# Patient Record
Sex: Male | Born: 1945 | Race: White | Hispanic: No | Marital: Married | Smoking: Never smoker
Health system: Southern US, Community
[De-identification: ages and names within clinical notes are randomized; demographics above are authoritative.]

## PROBLEM LIST (undated history)

## (undated) DIAGNOSIS — K219 Gastro-esophageal reflux disease without esophagitis: Secondary | ICD-10-CM

## (undated) DIAGNOSIS — I509 Heart failure, unspecified: Secondary | ICD-10-CM

## (undated) DIAGNOSIS — Z7401 Bed confinement status: Secondary | ICD-10-CM

## (undated) DIAGNOSIS — H409 Unspecified glaucoma: Secondary | ICD-10-CM

## (undated) DIAGNOSIS — G4730 Sleep apnea, unspecified: Secondary | ICD-10-CM

## (undated) DIAGNOSIS — I493 Ventricular premature depolarization: Secondary | ICD-10-CM

## (undated) DIAGNOSIS — M48061 Spinal stenosis, lumbar region without neurogenic claudication: Secondary | ICD-10-CM

## (undated) DIAGNOSIS — E785 Hyperlipidemia, unspecified: Secondary | ICD-10-CM

## (undated) DIAGNOSIS — I428 Other cardiomyopathies: Secondary | ICD-10-CM

## (undated) DIAGNOSIS — I10 Essential (primary) hypertension: Secondary | ICD-10-CM

## (undated) DIAGNOSIS — E119 Type 2 diabetes mellitus without complications: Secondary | ICD-10-CM

## (undated) DIAGNOSIS — M25511 Pain in right shoulder: Secondary | ICD-10-CM

## (undated) DIAGNOSIS — I2510 Atherosclerotic heart disease of native coronary artery without angina pectoris: Secondary | ICD-10-CM

## (undated) HISTORY — PX: HYDROCELE EXCISION / REPAIR: SUR1145

## (undated) HISTORY — PX: OTHER SURGICAL HISTORY: SHX169

## (undated) HISTORY — PX: REPLACEMENT TOTAL KNEE: SUR1224

## (undated) NOTE — *Deleted (*Deleted)
PMR Admission Coordinator Pre-Admission Assessment  Patient: Roy [name]. is an 72 y.o., male MRN: [id number] DOB: 10-Feb-1946 Height: 6\' 4"  (193 cm) Weight: 115.5 kg  Insurance Information HMO: yes    PPO:      PCP:      IPA:      80/20:      OTHER:  PRIMARY: [employer] Medicare      Policy#: [id number]      Subscriber: pt CM Name: Herbert Seta      Phone#: ***     Fax#: *** Pre-Cert#: ***      Employer: *** Benefits:  Phone #: ***     Name: *** Dolores Hoose. Date: ***     Deduct: ***      Out of Pocket Max: ***      Life Max: *** CIR: ***      SNF: *** Outpatient: ***     Co-Pay: *** Home Health: ***      Co-Pay: *** DME: ***     Co-Pay: *** Providers: ***  SECONDARY:       Policy#:      Phone#:   Financial Counselor:       Phone#:   The "Data Collection Information Summary" for patients in Inpatient Rehabilitation Facilities with attached "Privacy Act Statement-Health Care Records" was provided and verbally reviewed with: Patient and Family  Emergency Contact Information Contact Information    Name Relation Home Work Mobile   Vincent Spouse [phone]  [phone]      Current Medical History  Patient Admitting Diagnosis: lumbar radiculopathy History of Present Illness:   72 year old right-handed male with history of diastolic congestive heart failure, CAD/nonischemic cardiomyopathy maintained on aspirin, diabetes mellitus, glaucoma with left eye blindness, hypertension, hyperlipidemia.  Per chart review patient lives with spouse 1 level home 4 steps to entry.  Spouse reports patient was essentially dependent level for ADL management due to increasing lower extremity weakness.  Patient with complicated course dating 01/14/2020 with increasing weakness lower extremities.  MRI cervical spine revealed severe stenosis C3-4 spinal cord compression.  He was briefly discharged to skilled nursing facility awaiting plans for surgical intervention.  Admitted 01/29/2020 presented 01/29/2020  and underwent C3-4 anterior cervical decompression discectomy per Dr. Whitney Post.  A preoperative hemoglobin showed 13.4 no postoperative hemoglobin reported.  He was cleared to begin Lovenox for DVT prophylaxis.  ***    Patient's medical record from [facility] has been reviewed by the rehabilitation admission coordinator and physician.  Past Medical History  Past Medical History:  Diagnosis Date  . CHF (congestive heart failure) (HCC)   . Coronary artery disease   . Diabetes mellitus without complication (HCC)   . Glaucoma   . HLD (hyperlipidemia)   . Hypertension   . Non-ischemic cardiomyopathy (HCC)     Family History   family history is not on file.  Prior Rehab/Hospitalizations Has the patient had prior rehab or hospitalizations prior to admission? Yes  Has the patient had major surgery during 100 days prior to admission? Yes   Current Medications  Current Facility-Administered Medications:  .  0.9 %  sodium chloride infusion, 50 mL/hr, Intravenous, Continuous, Zdeb, Christine, NP, Stopped at 01/30/20 1505 .  alum & mag hydroxide-simeth (MAALOX/MYLANTA) 200-200-20 MG/5ML suspension 30 mL, 30 mL, Oral, Q6H PRN, Zdeb, Christine, NP .  amLODipine (NORVASC) tablet 5 mg, 5 mg, Oral, Daily, Zdeb, Christine, NP, 5 mg at 02/08/20 1019 .  bisacodyl (DULCOLAX) EC tablet 5 mg, 5 mg, Oral, Daily PRN, Zdeb,  Christine, NP .  brimonidine (ALPHAGAN) 0.2 % ophthalmic solution 1 drop, 1 drop, Right Eye, BID, Zdeb, Christine, NP, 1 drop at 02/08/20 1020 .  carvedilol (COREG) tablet 6.25 mg, 6.25 mg, Oral, BID, Zdeb, Christine, NP, 6.25 mg at 02/08/20 1019 .  Chlorhexidine Gluconate Cloth 2 % PADS 6 each, 6 each, Topical, Daily, [name], MD, 6 each at 02/08/20 1026 .  docusate sodium (COLACE) capsule 100 mg, 100 mg, Oral, BID, Zdeb, Christine, NP, 100 mg at 02/07/20 2252 .  enoxaparin (LOVENOX) injection 40 mg, 40 mg, Subcutaneous, Q24H, Zdeb, Christine, NP, 40 mg at 02/07/20 2253 .   famotidine (PEPCID) tablet 20 mg, 20 mg, Oral, QHS PRN, Zdeb, Christine, NP .  hydrALAZINE (APRESOLINE) injection 10 mg, 10 mg, Intravenous, Q4H PRN, Sreenath, Sudheer B, MD .  HYDROmorphone (DILAUDID) injection 0.25 mg, 0.25 mg, Intravenous, Q4H PRN, Zdeb, Christine, NP .  insulin aspart (novoLOG) injection 0-15 Units, 0-15 Units, Subcutaneous, TID WC, Zdeb, Christine, NP, 2 Units at 02/08/20 0831 .  insulin detemir (LEVEMIR) injection 12 Units, 12 Units, Subcutaneous, QHS, Lolita Patella B, MD, 12 Units at 02/07/20 2251 .  latanoprost (XALATAN) 0.005 % ophthalmic solution 1 drop, 1 drop, Right Eye, QHS, Zdeb, Christine, NP, 1 drop at 02/07/20 2253 .  linagliptin (TRADJENTA) tablet 5 mg, 5 mg, Oral, Daily, Sreenath, Sudheer B, MD, 5 mg at 02/08/20 1019 .  melatonin tablet 10 mg, 10 mg, Oral, QHS, Manuela Schwartz, NP, 10 mg at 02/07/20 2252 .  menthol-cetylpyridinium (CEPACOL) lozenge 3 mg, 1 lozenge, Oral, PRN **OR** phenol (CHLORASEPTIC) mouth spray 1 spray, 1 spray, Mouth/Throat, PRN, Zdeb, Christine, NP .  ondansetron (ZOFRAN) tablet 4 mg, 4 mg, Oral, Q6H PRN **OR** ondansetron (ZOFRAN) injection 4 mg, 4 mg, Intravenous, Q6H PRN, Zdeb, Christine, NP .  oxyCODONE (Oxy IR/ROXICODONE) immediate release tablet 10 mg, 10 mg, Oral, Q3H PRN, Zdeb, Christine, NP .  oxyCODONE (Oxy IR/ROXICODONE) immediate release tablet 5 mg, 5 mg, Oral, Q3H PRN, Zdeb, Christine, NP .  polyethylene glycol (MIRALAX / GLYCOLAX) packet 17 g, 17 g, Oral, Daily PRN, Zdeb, Christine, NP .  polyethylene glycol (MIRALAX / GLYCOLAX) packet 17 g, 17 g, Oral, q AM, [name], MD, 17 g at 02/06/20 8657 .  senna (SENOKOT) tablet 17.2 mg, 2 tablet, Oral, BID, Zdeb, Christine, NP, 17.2 mg at 02/07/20 0916 .  simvastatin (ZOCOR) tablet 20 mg, 20 mg, Oral, Daily, Zdeb, Christine, NP, 20 mg at 02/07/20 2252 .  sodium chloride flush (NS) 0.9 % injection 3 mL, 3 mL, Intravenous, Q12H, Zdeb, Christine, NP, 3 mL at 02/08/20 1025 .   sodium chloride flush (NS) 0.9 % injection 3 mL, 3 mL, Intravenous, PRN, Zdeb, Christine, NP .  sodium phosphate (FLEET) 7-19 GM/118ML enema 1 enema, 1 enema, Rectal, Once PRN, Zdeb, Christine, NP .  tamsulosin (FLOMAX) capsule 0.4 mg, 0.4 mg, Oral, QPC supper, Zdeb, Christine, NP, 0.4 mg at 02/07/20 1719 .  traZODone (DESYREL) tablet 50 mg, 50 mg, Oral, QHS PRN, Georgeann Oppenheim, Sudheer B, MD, 50 mg at 02/06/20 2318  Patients Current Diet:  Diet Order            Diet Carb Modified Fluid consistency: Thin; Room service appropriate? Yes  Diet effective now                 Precautions / Restrictions Precautions Precautions: Fall Precaution Booklet Issued: No Precaution Comments: knees buckle Other Brace: new order for soft cervical brace for pt comfort; bilat knee braces Restrictions Weight  Bearing Restrictions: No Other Position/Activity Restrictions: B shoulder pain/weakness limting UE assist.   Has the patient had 2 or more falls or a fall with injury in the past year? No  Prior Activity Level Limited Community (1-2x/wk): Mod I with RW short disctances only  Prior Functional Level Self Care: Did the patient need help bathing, dressing, using the toilet or eating? Needed some help  Indoor Mobility: Did the patient need assistance with walking from room to room (with or without device)? Independent  Stairs: Did the patient need assistance with internal or external stairs (with or without device)? Needed some help  Functional Cognition: Did the patient need help planning regular tasks such as shopping or remembering to take medications? Independent  Home Assistive Devices / Equipment Home Assistive Devices/Equipment: Eyeglasses Home Equipment: Environmental consultant - 2 wheels, Environmental consultant - 4 wheels, Grab bars - tub/shower, Shower seat  Prior Device Use: Indicate devices/aids used by the patient prior to current illness, exacerbation or injury? Walker  Current Functional Level Cognition  Overall  Cognitive Status: Within Functional Limits for tasks assessed Orientation Level: Oriented X4 General Comments: A&O x 4, becomes anxious with attempts to stand    Extremity Assessment (includes Sensation/Coordination)  Upper Extremity Assessment: Generalized weakness, Defer to OT evaluation (noted decreased BUE ROM and strength bilaterally) LUE Deficits / Details: Pt endorses LUE sensation is improved since his sx, however, it remains weak with poor shoulder flexion/abduction. AROM limited to ~80-90 degrees strength poor grossly 2/5 as well as decreased FMC and edema in L hand. LUE Sensation: history of peripheral neuropathy LUE Coordination: decreased fine motor, decreased gross motor  Lower Extremity Assessment: Generalized weakness (grossly 3- to 3+/5 bilaterally)    ADLs  Overall ADL's : Needs assistance/impaired Eating/Feeding: Sitting, Bed level, With caregiver independent assisting, Moderate assistance Eating/Feeding Details (indicate cue type and reason): Pt limited by cervical precautions, unable to utlize learned compensatory stratgies for decreased shoulder flexion and FMC. Spouse has been assisting with all meals since admission. Grooming: Sitting, Cueing for compensatory techniques, Cueing for safety, Moderate assistance Grooming Details (indicate cue type and reason): OTR provided mod physical assist for pt to simulate face washing with L hand.  Pt limited by poor grasp and decreased shoulder/wrist ROM needed to angle/reach.  Pt able to wash face with R hand with min A. Upper Body Bathing: Moderate assistance, Sitting, Cueing for safety, Cueing for compensatory techniques Upper Body Bathing Details (indicate cue type and reason): pt requires hand over hand assist to grip wash cloth/towel d/t weak grasp and limited shoulder/wrist ROM Lower Body Bathing: Cueing for back precautions, Moderate assistance, Sitting/lateral leans Upper Body Dressing : Sitting, Maximal assistance, Cueing  for safety, Cueing for compensatory techniques Lower Body Dressing: Cueing for compensatory techniques, +2 for physical assistance, Maximal assistance, Sit to/from stand Toilet Transfer: +2 for physical assistance, Maximal assistance, Cueing for safety, Cueing for sequencing, Stand-pivot, BSC Toileting- Clothing Manipulation and Hygiene: Bed level, Maximal assistance Toileting - Clothing Manipulation Details (indicate cue type and reason): Pt is able to roll side-to side with Min A cueing for cervical precautions. Functional mobility during ADLs: Rolling walker, Moderate assistance, Minimal assistance, +2 for physical assistance General ADL Comments: Session focused on BUE mobility/strengthening, as well as functional independence in UB ADLs    Mobility  Overal bed mobility: Needs Assistance Bed Mobility: Sidelying to Sit, Rolling, Supine to Sit, Sit to Supine Rolling: Max assist Sidelying to sit: Max assist, +2 for safety/equipment Supine to sit: Max assist, +2 for safety/equipment  Sit to supine: Max assist Sit to sidelying: Max assist, +2 for safety/equipment, +2 for physical assistance General bed mobility comments: Pt was able to perform log roll R to short sit. max assist to roll with +2 asssist tosafely achieve EOB short sit. Pt present with increased overall weakness today. He is more anxious with all mobility and transfers.    Transfers  Overall transfer level: Needs assistance Equipment used: Rolling walker (2 wheeled) Transfers: Sit to/from Stand Sit to Stand: +2 safety/equipment, +2 physical assistance, From elevated surface Stand pivot transfers: +2 physical assistance, +2 safety/equipment, From elevated surface, Max assist General transfer comment: Pt performed STS with +2 assist for safety. Used Rozell Searing for safety. stood 4 x in sabina x 2 minutes. pt fatigues quickly. Did use lift to safely progress pt to recliner from EOB. returned later to assist pt back to bed.    Ambulation  / Gait / Stairs / Wheelchair Mobility  Ambulation/Gait Ambulation/Gait assistance: Max assist, +2 physical assistance Gait Distance (Feet): 2 Feet Assistive device: Rolling walker (2 wheeled) Gait Pattern/deviations: Step-to pattern General Gait Details: unsafe to trial. continues to have knee buckling present Gait velocity: decreased    Posture / Balance Dynamic Sitting Balance Sitting balance - Comments: pt required constant assistance to maintain balance while seated EOB. much more assistance required today versus previously observed Balance Overall balance assessment: Needs assistance Sitting-balance support: Feet supported, Bilateral upper extremity supported Sitting balance-Leahy Scale: Poor Sitting balance - Comments: pt required constant assistance to maintain balance while seated EOB. much more assistance required today versus previously observed Postural control: Other (comment) (anterior lean) Standing balance support: Bilateral upper extremity supported, During functional activity Standing balance-Leahy Scale: Poor Standing balance comment: heavy lean on RW with +2 hands on at all times for safety.  L knee pain/weakness limits progression    Special needs/care consideration 16 fr urethral catheter placed on 01/19/2020   Previous Home Environment  Living Arrangements: Spouse/significant other  Lives With: Spouse Available Help at Discharge: Family, Available 24 hours/day Type of Home: House Home Layout: One level Home Access: Stairs to enter Entrance Stairs-Rails: Right Entrance Stairs-Number of Steps: 7 in front, 4 in the garage Bathroom Shower/Tub: Health visitor: Handicapped height Bathroom Accessibility: Yes How Accessible: Accessible via walker Home Care Services: No Additional Comments: was at SNF for 3 to 4 days between 2 surgeries  Discharge Living Setting Plans for Discharge Living Setting: Patient's home, Lives with (comment) (wife) Type of  Home at Discharge: House Discharge Home Layout: One level Discharge Home Access: Stairs to enter Entrance Stairs-Rails: Right Entrance Stairs-Number of Steps: 7 in front ; 4 in garage Discharge Bathroom Shower/Tub: Walk-in shower Discharge Bathroom Toilet: Handicapped height Discharge Bathroom Accessibility: Yes How Accessible: Accessible via walker Does the patient have any problems obtaining your medications?: No  Social/Family/Support Systems Contact Information: wife, Myra Anticipated Caregiver: wife Anticipated Caregiver's Contact Information: see above Caregiver Availability: 24/7 Discharge Plan Discussed with Primary Caregiver: Yes Is Caregiver In Agreement with Plan?: Yes Does Caregiver/Family have Issues with Lodging/Transportation while Pt is in Rehab?: No  Goals Patient/Family Goal for Rehab: min assist with PT and OT Expected length of stay: ELOS 2 to 3 weeks Pt/Family Agrees to Admission and willing to participate: Yes Program Orientation Provided & Reviewed with Pt/Caregiver Including Roles  & Responsibilities: Yes  Decrease burden of Care through IP rehab admission: n/a  Possible need for SNF placement upon discharge: not anticipated  Patient Condition: I have reviewed medical records  from [facility], spoken with CSW, and patient and family member. I discussed via phone for inpatient rehabilitation assessment.  Patient will benefit from ongoing PT and OT, can actively participate in 3 hours of therapy a day 5 days of the week, and can make measurable gains during the admission.  Patient will also benefit from the coordinated team approach during an Inpatient Acute Rehabilitation admission.  The patient will receive intensive therapy as well as Rehabilitation physician, nursing, social worker, and care management interventions.  Due to bladder management, bowel management, safety, skin/wound care, disease management, medication administration, pain management and patient  education the patient requires 24 hour a day rehabilitation nursing.  The patient is currently  Max assist with mobility overall with mobility and basic ADLs.  Discharge setting and therapy post discharge at home with home health is anticipated.  Patient has agreed to participate in the Acute Inpatient Rehabilitation Program and will admit today.  Preadmission Screen Completed By:  Clois Dupes, 02/08/2020 11:02 AM ______________________________________________________________________   Discussed status with Dr. Marland Kitchen on *** at *** and received approval for admission today.  Admission Coordinator:  Clois Dupes, RN, time Marland KitchenDorna Bloom ***   Assessment/Plan: Diagnosis: 1. Does the need for close, 24 hr/day Medical supervision in concert with the patient's rehab needs make it unreasonable for this patient to be served in a less intensive setting? {yes_no_potentially:3041433} 2. Co-Morbidities requiring supervision/potential complications: *** 3. Due to {due ZO:1096045}, does the patient require 24 hr/day rehab nursing? {yes_no_potentially:3041433} 4. Does the patient require coordinated care of a physician, rehab nurse, PT, OT, and SLP to address physical and functional deficits in the context of the above medical diagnosis(es)? {yes_no_potentially:3041433} Addressing deficits in the following areas: {deficits:3041436} 5. Can the patient actively participate in an intensive therapy program of at least 3 hrs of therapy 5 days a week? {yes_no_potentially:3041433} 6. The potential for patient to make measurable gains while on inpatient rehab is {potential:3041437} 7. Anticipated functional outcomes upon discharge from inpatient rehab: {functional outcomes:304600100} PT, {functional outcomes:304600100} OT, {functional outcomes:304600100} SLP 8. Estimated rehab length of stay to reach the above functional goals is: *** 9. Anticipated discharge destination: {anticipated dc setting:21604}  10. Overall Rehab/Functional Prognosis: {potential:3041437}   MD Signature: ***

## (undated) NOTE — *Deleted (*Deleted)
Physical Medicine and Rehabilitation Admission H&P     HPI: Roy [name]. Kurt, Hoffmeier. 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  Per chart review patient lives with spouse 1 level home 4 steps to entry.  Spouse reports patient was essentially dependent level for ADL management due to increasing lower extremity weakness.  Patient with complicated course dating 01/14/2020 with increasing weakness lower extremities.  MRI cervical spine revealed severe stenosis C3-4 spinal cord compression.  He was briefly discharged to skilled nursing facility awaiting plans for surgical intervention.  Admitted 01/29/2020 presented 01/29/2020 and underwent C3-4 anterior cervical decompression discectomy per Dr. Whitney Post.  A preoperative hemoglobin showed 13.4 no postoperative hemoglobin reported.  He was cleared to begin Lovenox for DVT prophylaxis.  Therapy evaluations completed and patient was admitted for a comprehensive rehab program.  Review of Systems  Constitutional: Negative for chills and fever.  HENT: Negative for hearing loss.   Eyes: Negative for blurred vision and double vision.  Respiratory: Negative for cough and shortness of breath.   Cardiovascular: Positive for leg swelling. Negative for chest pain and palpitations.  Gastrointestinal: Positive for constipation. Negative for heartburn and nausea.  Genitourinary: Negative for dysuria, flank pain and hematuria.  Musculoskeletal: Positive for joint pain, myalgias and neck pain.  Skin: Negative for rash.  Neurological: Positive for sensory change.       Bilateral lower extremity weakness  All other systems reviewed and are negative.  Past Medical History:  Diagnosis Date  . CHF (congestive heart failure) (HCC)   . Coronary artery disease   . Diabetes mellitus  without complication (HCC)   . Glaucoma   . HLD (hyperlipidemia)   . Hypertension   . Non-ischemic cardiomyopathy [facility])    Past Surgical History:  Procedure Laterality Date  . ANTERIOR CERVICAL DECOMP/DISCECTOMY FUSION N/A 01/29/2020   Procedure: ANTERIOR CERVICAL DECOMPRESSION/DISCECTOMY FUSION 1 LEVEL C3/4;  Surgeon: [name], MD;  Location: [facility] ORS;  Service: Neurosurgery;  Laterality: N/A;  . arm surgery Right    4x surgery as a child, cut arm falling through glass window   . HYDROCELE EXCISION / REPAIR    . REPLACEMENT TOTAL KNEE Right    History reviewed. No pertinent family history. Social History:  reports that he has never smoked. He has never used smokeless tobacco. He reports that he does not drink alcohol. No history on file for drug use. Allergies: No Known Allergies Medications Prior to Admission  Medication Sig Dispense Refill  . aspirin 81 MG chewable tablet Chew 81 mg by mouth daily.    . brimonidine (ALPHAGAN) 0.2 % ophthalmic solution Place 1 drop into the right eye in the morning and at bedtime.    . carvedilol (COREG) 6.25 MG tablet Take 6.25 mg by mouth 2 (two) times daily.    . cyanocobalamin 2000 MCG tablet Take 2,000 mcg by mouth daily.    . famotidine (PEPCID) 20 MG tablet Take 20 mg by mouth at bedtime as needed for heartburn.    . insulin aspart (NOVOLOG) 100 UNIT/ML injection Inject 4-8 Units into the skin 3 (three) times daily with meals. Sliding scale    . insulin detemir (LEVEMIR FLEXTOUCH) 100 UNIT/ML FlexPen Inject 12 Units into the skin at bedtime.     Marland Kitchen latanoprost (XALATAN) 0.005 % ophthalmic solution Place 1 drop into the right eye at bedtime.    Marland Kitchen  lisinopril (ZESTRIL) 5 MG tablet Take 5 mg by mouth daily.    . melatonin 5 MG TABS Take 5-10 mg by mouth at bedtime as needed.    . meloxicam (MOBIC) 7.5 MG tablet Take 7.5 mg by mouth daily.    . metFORMIN (GLUCOPHAGE-XR) 500 MG 24 hr tablet Take 500 mg by mouth 2 (two) times daily.    . Multiple  Vitamins-Minerals (CENTRUM SILVER 50+MEN PO) Take 1 tablet by mouth daily.    . polyethylene glycol powder (GLYCOLAX/MIRALAX) 17 GM/SCOOP powder Take 17 g by mouth in the morning.    . simvastatin (ZOCOR) 20 MG tablet Take 20 mg by mouth daily.    . tamsulosin (FLOMAX) 0.4 MG CAPS capsule Take 1 capsule (0.4 mg total) by mouth daily after supper. 30 capsule 1  . traZODone (DESYREL) 50 MG tablet Take 25 mg by mouth at bedtime as needed for sleep.       Drug Regimen Review Drug regimen was reviewed and remains appropriate with no significant issues identified  Home: Home Living Family/patient expects to be discharged to:: Inpatient rehab Living Arrangements: Spouse/significant other Available Help at Discharge: Family Type of Home: House Home Access: Stairs to enter [employer] of Steps: 7 in front, 4 in the garage Entrance Stairs-Rails: Right (at garage steps) Home Layout: One level Home Equipment: Environmental consultant - 2 wheels, Environmental consultant - 4 wheels, Grab bars - tub/shower, Software engineer History: Prior Function Level of Independence: Needs assistance Gait / Transfers Assistance Needed: able to ambulate short distances with RW at home at baseline. ADL's / Homemaking Assistance Needed: Pt/caregiver report, pt requires assist with most BADL management at baseline. He has difficulty with self-feeding, bathing, and dressing. He has been generally bed level for ADL management since fall this month. Comments: Wife reports that pt was ambulatory about 3 weeks ago and was unable to stand from toilet at home and she was unable to get him up physically.  Functional Status:  Mobility: Bed Mobility Overal bed mobility: Needs Assistance Bed Mobility: Sidelying to Sit, Rolling, Supine to Sit, Sit to Supine Rolling: Mod assist Sidelying to sit: Max assist Supine to sit: Max assist Sit to supine: Max assist Sit to sidelying: Max assist General bed mobility comments: Increased time to  perform. Roll L to short sit. increased time and vcs for sequencing and safety Transfers Overall transfer level: Needs assistance Equipment used: Rolling walker (2 wheeled) Transfers: Sit to/from Stand, Stand Pivot Transfers Sit to Stand: +2 safety/equipment, +2 physical assistance, From elevated surface, Min assist, Mod assist Stand pivot transfers: +2 physical assistance, +2 safety/equipment, From elevated surface, Max assist General transfer comment: pt performed STS 3 x EOB prior to stand pivot to recliner. requires +2 assist to safely stand to RW. +2 max assist to stand pivot without use of RW for safety. Ambulation/Gait Ambulation/Gait assistance: Max assist, +2 physical assistance Gait Distance (Feet): 2 Feet Assistive device: Rolling walker (2 wheeled) Gait Pattern/deviations: Step-to pattern General Gait Details: pt was able to clear R/L LE in static standing however requires max assist + vcs while therapist protetcs knees from buckling. Gait velocity: decreased    ADL: ADL Overall ADL's : Needs assistance/impaired Eating/Feeding: Sitting, Bed level, With caregiver independent assisting, Moderate assistance Eating/Feeding Details (indicate cue type and reason): Pt limited by cervical precautions, unable to utlize learned compensatory stratgies for decreased shoulder flexion and FMC. Spouse has been assisting with all meals since admission. Grooming: Sitting, Cueing for compensatory techniques, Cueing for  safety, Moderate assistance Grooming Details (indicate cue type and reason): OTR provided mod physical assist for pt to simulate face washing with L hand.  Pt limited by poor grasp and decreased shoulder/wrist ROM needed to angle/reach.  Pt able to wash face with R hand with min A. Upper Body Bathing: Moderate assistance, Sitting, Cueing for safety, Cueing for compensatory techniques Upper Body Bathing Details (indicate cue type and reason): pt requires hand over hand assist to grip  wash cloth/towel d/t weak grasp and limited shoulder/wrist ROM Lower Body Bathing: Cueing for back precautions, Moderate assistance, Sitting/lateral leans Upper Body Dressing : Sitting, Maximal assistance, Cueing for safety, Cueing for compensatory techniques Lower Body Dressing: Cueing for compensatory techniques, +2 for physical assistance, Maximal assistance, Sit to/from stand Toilet Transfer: +2 for physical assistance, Maximal assistance, Cueing for safety, Cueing for sequencing, Stand-pivot, BSC Toileting- Clothing Manipulation and Hygiene: Bed level, Maximal assistance Toileting - Clothing Manipulation Details (indicate cue type and reason): Pt is able to roll side-to side with Min A cueing for cervical precautions. Functional mobility during ADLs: Rolling walker, Moderate assistance, Minimal assistance, +2 for physical assistance General ADL Comments: Session focused on BUE mobility/strengthening, as well as functional independence in UB ADLs  Cognition: Cognition Overall Cognitive Status: Within Functional Limits for tasks assessed Orientation Level: Oriented X4 Cognition Arousal/Alertness: Awake/alert Behavior During Therapy: WFL for tasks assessed/performed, Anxious Overall Cognitive Status: Within Functional Limits for tasks assessed General Comments: A&O x 4, becomes anxious with attempts to stand  Physical Exam: Blood pressure 128/71, pulse 77, temperature 98 F (36.7 C), temperature source Oral, resp. rate 18, height 6\' 4"  (1.93 m), weight 115.5 kg, SpO2 95 %. Physical Exam Neurological:     Comments: Alert no acute distress oriented x3.     Results for orders placed or performed during the hospital encounter of 01/29/20 (from the past 48 hour(s))  Glucose, capillary     Status: Abnormal   Collection Time: 02/05/20 11:32 AM  Result Value Ref Range   Glucose-Capillary 145 (H) 70 - 99 mg/dL    Comment: Glucose reference range applies only to samples taken after fasting  for at least 8 hours.  Glucose, capillary     Status: Abnormal   Collection Time: 02/05/20  4:18 PM  Result Value Ref Range   Glucose-Capillary 150 (H) 70 - 99 mg/dL    Comment: Glucose reference range applies only to samples taken after fasting for at least 8 hours.  Glucose, capillary     Status: Abnormal   Collection Time: 02/05/20  9:29 PM  Result Value Ref Range   Glucose-Capillary 144 (H) 70 - 99 mg/dL    Comment: Glucose reference range applies only to samples taken after fasting for at least 8 hours.   Comment 1 Notify RN   Glucose, capillary     Status: None   Collection Time: 02/06/20  7:29 AM  Result Value Ref Range   Glucose-Capillary 95 70 - 99 mg/dL    Comment: Glucose reference range applies only to samples taken after fasting for at least 8 hours.  Glucose, capillary     Status: Abnormal   Collection Time: 02/06/20 11:24 AM  Result Value Ref Range   Glucose-Capillary 145 (H) 70 - 99 mg/dL    Comment: Glucose reference range applies only to samples taken after fasting for at least 8 hours.  Glucose, capillary     Status: Abnormal   Collection Time: 02/06/20  4:31 PM  Result Value Ref Range  Glucose-Capillary 206 (H) 70 - 99 mg/dL    Comment: Glucose reference range applies only to samples taken after fasting for at least 8 hours.  Glucose, capillary     Status: Abnormal   Collection Time: 02/06/20  9:15 PM  Result Value Ref Range   Glucose-Capillary 152 (H) 70 - 99 mg/dL    Comment: Glucose reference range applies only to samples taken after fasting for at least 8 hours.   Comment 1 Notify RN    No results found.     Medical Problem List and Plan: 1.  Decreased functional ability lower extremity weakness secondary to cervical myelopathy.  Status post C3-4 anterior cervical decompression and discectomy 01/29/2020.  Cervical collar as directed  -patient may *** shower  -ELOS/Goals: *** 2.  Antithrombotics: -DVT/anticoagulation: Lovenox.  Vascular lab  currently able to complete duplex due to scheduling  -antiplatelet therapy: N/A 3. Pain Management: Oxycodone as needed 4. Mood: Melatonin 10 mg nightly  -antipsychotic agents: N/A 5. Neuropsych: This patient is capable of making decisions on his own behalf. 6. Skin/Wound Care: Routine skin checks 7. Fluids/Electrolytes/Nutrition: Routine in and outs with follow-up chemistries 8.  Hypertension.  Coreg 6.25 mg twice daily, Norvasc 5 mg daily.  Monitor with increased mobility 9.  Diabetes mellitus with peripheral neuropathy.  Hemoglobin A1c 5.8.  Levemir 12 units nightly, Tradjenta 5 mg daily. 10.  Hyperlipidemia.  Zocor 11.  BPH.  Flomax 0.4 mg daily.  Check PVR 12.  CAD with nonischemic cardiomyopathy.  Plan to discuss with neurosurgery on resuming low-dose aspirin. 13.  GERD.  Pepcid 14.  Glaucoma.  Legally blind left eye.  Continue eyedrops 15.  Constipation.  MiraLAX daily.  Dulcolax tablet daily as needed  ***  Charlton Amor, PA-C 02/07/2020

---

## 2009-11-06 ENCOUNTER — Encounter: Payer: Self-pay | Admitting: Oral Surgery

## 2009-11-10 ENCOUNTER — Encounter: Payer: Self-pay | Admitting: Oral Surgery

## 2013-02-08 ENCOUNTER — Ambulatory Visit: Payer: Self-pay | Admitting: Internal Medicine

## 2013-02-10 ENCOUNTER — Ambulatory Visit: Payer: Self-pay | Admitting: Internal Medicine

## 2013-03-12 ENCOUNTER — Ambulatory Visit: Payer: Self-pay | Admitting: Internal Medicine

## 2013-04-12 ENCOUNTER — Ambulatory Visit: Payer: Self-pay | Admitting: Internal Medicine

## 2015-01-07 ENCOUNTER — Ambulatory Visit: Payer: Medicare Other | Attending: Orthopedic Surgery | Admitting: Physical Therapy

## 2015-01-07 DIAGNOSIS — M25661 Stiffness of right knee, not elsewhere classified: Secondary | ICD-10-CM | POA: Diagnosis present

## 2015-01-07 DIAGNOSIS — R262 Difficulty in walking, not elsewhere classified: Secondary | ICD-10-CM | POA: Diagnosis present

## 2015-01-08 ENCOUNTER — Encounter: Payer: Self-pay | Admitting: Physical Therapy

## 2015-01-08 NOTE — Therapy (Signed)
[facility] [address] [place], [place], [address] Phone: [phone]   Fax:  [phone]  Physical Therapy Evaluation  Patient Details  Name: Roy [name]. MRN: [id number] Date of Birth: Oct 09, 1945 Referring Provider:  Gilman Buttner, MD  Encounter Date: 01/07/2015      PT End of Session - 01/07/15 1249    Visit Number 1   Number of Visits 8   Date for PT Re-Evaluation 02/04/15   Authorization - Visit Number 1   Authorization - Number of Visits 10   PT Start Time 1247   PT Stop Time 1345   PT Time Calculation (min) 58 min   Activity Tolerance Patient tolerated treatment well      History reviewed. No pertinent past medical history.  History reviewed. No pertinent past surgical history.  There were no vitals filed for this visit.  Visit Diagnosis:  Joint stiffness of knee, right  Difficulty walking      Subjective Assessment - 01/07/15 1247    Subjective Pt. s/p R TKA 3 weeks ago and reports no pain at this time.  Pt. ambulates with no assistive device in gym while carrying SPC.     Limitations Standing;Lifting;Walking   How long can you stand comfortably? around 10 minutes   How long can you walk comfortably? 10 minutes   Diagnostic tests XRAYS prior to surgery    Patient Stated Goals Increase R knee ROM/ strengthening.     Currently in Pain? No/denies          OBJECTIVE: L/R knee circumferential: joint line (45.2/ 46.6 cm), distal quad (52/ 56 cm.), mid-gastroc (44.5/ 46.5 cm.).   Supine R knee AROM (-8 deg. To 103 deg.), PROM flexion 109 deg.  Moderate R lower leg pitting edema noted.   L knee AROM (-8 deg. To 116 deg.)- pain at distal quad with end-range flexion.   LEFS: 43 out of 80.   Good scar healing with several scabs noted with warmth/swelling present.  Manual: Cross friction mobilization to scar. STM to distal R quad. Grade III patellar mobilizations in all 4 directions (good mobility  noted even with edema present).  Supine R knee AA/PROM to tolerable end-range flexion (pain limited).            PT Long Term Goals - 01/08/15 5790    PT LONG TERM GOAL #1   Title Pt will ascend steps with a reciprocal pattern and descend with a step to gait pattern forwards facing in order to safely enter/exit home.    Baseline step to ascending, sideways step to descending   Time 4   Period Weeks   Status New   PT LONG TERM GOAL #2   Title Pt will improve R AROM knee flexion to 116 degrees or greater (equal to R) in order to ambulate with a normalized gait pattern.    Baseline R 103 degrees AROM    Time 4   Period Weeks   Status New   PT LONG TERM GOAL #3   Title Pt will improve LEFS score to > 50 out of 80 in order to improve functional mobility.    Baseline 43/80 on 9/27   Time 4   Period Weeks   Status New   PT LONG TERM GOAL #4   Title Pt will be independent with home exercise program in order to improve R hamstring strength by 1/2 MMT to improve gait in grocery store.    Baseline  R hamstring 4/5, L 4+/5    Time 4   Period Weeks   Status New               Plan - 01/07/15 1250    Clinical Impression Statement Pt. is a pleasant 69 y/o M s/p R TKR DOS: 12/17/14. Pt presents with no pain. Pt completed HHPT Friday (01/03/15). Pt ambulates with and without SPC into PT gym. Pt demonstrates good heel strike with R LE but ambulates with decreased knee flexion. Pt states he has not ambulated outside of his home but is ambulating in his home with no AD. Pt's TUG time: 13 seconds with and without the cane. LEFS: 43/80. Good patellar mobility with grade III mobilizations, edema present.  Pt's MMT B grossly 4+5 except for R hamstring 4/5. Pt's knee AROM L/R in supine: -8-116 degrees/ -8-103 degrees AROM. R knee flexion PROM 109 degrees. R pitting edema noted with manual drainage. Circumfrential measurements: Joint line L/R: 45.2 /46.6 cm, Distal quad L/R: 52/56 cm, mid-gastroc L/R:  45.5/46.5 cm.   Pt. will benefit from skilled PT services to increase R knee ROM/stability to improve pain-free mobility.     Pt will benefit from skilled therapeutic intervention in order to improve on the following deficits Abnormal gait;Decreased range of motion;Difficulty walking;Decreased endurance;Decreased balance;Decreased activity tolerance;Decreased mobility;Decreased strength;Increased edema;Impaired flexibility;Improper body mechanics;Hypomobility;Decreased scar mobility;Pain   Rehab Potential Good   PT Frequency 2x / week   PT Duration 4 weeks   PT Treatment/Interventions Cryotherapy;ADLs/Self Care Home Management;Moist Heat;Stair training;Gait training;Neuromuscular re-education;Scar mobilization;Patient/family education;Passive range of motion;Manual techniques;Therapeutic activities;Therapeutic exercise;Functional mobility training;Balance training   PT Next Visit Plan more closed chain strengthening/stairs/gait without AD   PT Home Exercise Plan continue with HHPT exercises    Consulted and Agree with Plan of Care Patient          G-Codes - 01/29/15 1332    Functional Assessment Tool Used LEFS/ clinical impression/ pain/ ROM   Functional Limitation Mobility: Walking and moving around   Mobility: Walking and Moving Around Current Status (Z6109) At least 40 percent but less than 60 percent impaired, limited or restricted   Mobility: Walking and Moving Around Goal Status (U0454) At least 1 percent but less than 20 percent impaired, limited or restricted       Problem List There are no active problems to display for this patient.  [name], PT, DPT # [id number]   01/29/2015, 1:32 PM  [place] [facility] [address] [place], [place], [address] Phone: [phone]   Fax:  [phone]

## 2015-01-09 ENCOUNTER — Encounter: Payer: Self-pay | Admitting: Physical Therapy

## 2015-01-09 ENCOUNTER — Ambulatory Visit: Payer: Medicare Other | Admitting: Physical Therapy

## 2015-01-09 DIAGNOSIS — M25661 Stiffness of right knee, not elsewhere classified: Secondary | ICD-10-CM | POA: Diagnosis not present

## 2015-01-09 DIAGNOSIS — R262 Difficulty in walking, not elsewhere classified: Secondary | ICD-10-CM

## 2015-01-09 NOTE — Therapy (Signed)
[facility] [address] [place], [place], [address] Phone: [phone]   Fax:  [phone]  Physical Therapy Treatment  Patient Details  Name: Roy [name]. MRN: [id number] Date of Birth: [birth year] Referring Provider:  Gilman Buttner, MD  Encounter Date: 01/09/2015      PT End of Session - 01/09/15 1744    Visit Number 2   Number of Visits 8   Date for PT Re-Evaluation 02/04/15   Authorization - Visit Number 2   Authorization - Number of Visits 10   PT Start Time 1247   PT Stop Time 1330   PT Time Calculation (min) 43 min   Activity Tolerance Patient tolerated treatment well;No increased pain   Behavior During Therapy [facility] for tasks assessed/performed      History reviewed. No pertinent past medical history.  History reviewed. No pertinent past surgical history.  There were no vitals filed for this visit.  Visit Diagnosis:  Joint stiffness of knee, right  Difficulty walking      Subjective Assessment - 01/09/15 1743    Subjective Pt ambulates with no assistive device and reports no new complaints since last PT tx session. Pt reports doing his home exercises twice at home already today.    Limitations Standing;Lifting;Walking   How long can you stand comfortably? around 10 minutes   How long can you walk comfortably? 10 minutes   Diagnostic tests XRAYS prior to surgery    Patient Stated Goals Increase R knee ROM/ strengthening.     Currently in Pain? No/denies          OBJECTIVE: Manual: STM mobilization to R distal quad (trigger points noted). Ischemic compression to trigger points with relief. Patellar mobilization all 4 planes grade III 4 x 20 seconds each direction (edema limited). R distal and proximal hamstring stretching.  PROM to R knee in supine position (flexion/ ext.).   Cool down: Nustep L7 10 mins (no charge). There ex: stair navigation: ascending with step over step gait pattern,  descending with step to gait secondary to concerns of L knee buckling.   Pt response to treatment: Good compliance with HEP and icing to reduce edema. Pt currently limited in mobility secondary to edema. Pt finds relief from soft tissue and patellar mobilizations.           PT Long Term Goals - 01/08/15 4818    PT LONG TERM GOAL #1   Title Pt will ascend steps with a reciprocal pattern and descend with a step to gait pattern forwards facing in order to safely enter/exit home.    Baseline step to ascending, sideways step to descending   Time 4   Period Weeks   Status New   PT LONG TERM GOAL #2   Title Pt will improve R AROM knee flexion to 116 degrees or greater (equal to R) in order to ambulate with a normalized gait pattern.    Baseline R 103 degrees AROM    Time 4   Period Weeks   Status New   PT LONG TERM GOAL #3   Title Pt will improve LEFS score to > 50 out of 80 in order to improve functional mobility.    Baseline 43/80 on 9/27   Time 4   Period Weeks   Status New   PT LONG TERM GOAL #4   Title Pt will be independent with home exercise program in order to improve R hamstring strength by 1/2 MMT to  improve gait in grocery store.    Baseline R hamstring 4/5, L 4+/5    Time 4   Period Weeks   Status New               Plan - 01/09/15 1744    Clinical Impression Statement Pt still has increased R LE edema compared to L LE. Pt is compliant with icing at home. Pt gets relief from manual lymph drainage. Pt has good patellar tracking but limited in medial, inferior and superior mobilizations secondary to swelling. Pt reports lateral mobilizations feeling good. Pt has trigger points in R distal quad as noted with STM. Pt is able to ascend the steps step over step with B handrail. Pt descends the steps with step to gait and B handrails secondary to fear of L knee buckling. Pt continues to benefit from [facility], strengthening and balance training.    Pt will benefit from  skilled therapeutic intervention in order to improve on the following deficits Abnormal gait;Decreased range of motion;Difficulty walking;Decreased endurance;Decreased balance;Decreased activity tolerance;Decreased mobility;Decreased strength;Increased edema;Impaired flexibility;Improper body mechanics;Hypomobility;Decreased scar mobility;Pain   Rehab Potential Good   PT Frequency 2x / week   PT Duration 4 weeks   PT Treatment/Interventions Cryotherapy;ADLs/Self Care Home Management;Moist Heat;Stair training;Gait training;Neuromuscular re-education;Scar mobilization;Patient/family education;Passive range of motion;Manual techniques;Therapeutic activities;Therapeutic exercise;Functional mobility training;Balance training   PT Next Visit Plan more closed chain strengthening/stairs/gait without AD   PT Home Exercise Plan continue with HHPT exercises    Consulted and Agree with Plan of Care Patient        Problem List There are no active problems to display for this patient.  [name], PT, DPT # [id number]   01/10/2015, 1:37 PM  [place] [facility] [address] [place], [place], [address] Phone: [phone]   Fax:  [phone]

## 2015-01-14 ENCOUNTER — Ambulatory Visit: Payer: Medicare Other | Attending: Orthopedic Surgery | Admitting: Physical Therapy

## 2015-01-14 ENCOUNTER — Encounter: Payer: Self-pay | Admitting: Physical Therapy

## 2015-01-14 DIAGNOSIS — M25661 Stiffness of right knee, not elsewhere classified: Secondary | ICD-10-CM | POA: Insufficient documentation

## 2015-01-14 DIAGNOSIS — R262 Difficulty in walking, not elsewhere classified: Secondary | ICD-10-CM | POA: Insufficient documentation

## 2015-01-15 NOTE — Therapy (Signed)
[facility] [address] [place], [place], [address] Phone: [phone]   Fax:  [phone]  Physical Therapy Treatment  Patient Details  Name: Roy [name]. MRN: [id number] Date of Birth: 04-19-1945 Referring Provider:  Gilman Buttner, MD  Encounter Date: 01/14/2015      PT End of Session - 01/14/15 1539    Visit Number 3   Number of Visits 8   Date for PT Re-Evaluation 02/04/15   Authorization - Visit Number 3   Authorization - Number of Visits 10   PT Start Time 1240   PT Stop Time 1355   PT Time Calculation (min) 75 min   Activity Tolerance Patient tolerated treatment well;No increased pain   Behavior During Therapy [facility] for tasks assessed/performed      History reviewed. No pertinent past medical history.  History reviewed. No pertinent past surgical history.  There were no vitals filed for this visit.  Visit Diagnosis:  Joint stiffness of knee, right  Difficulty walking      Subjective Assessment - 01/14/15 1539    Subjective Pt reports no pain or no new complaints since last PT tx session. Pt states he walked once over the weekend with his wife 0.25 miles and then iced his knee. Pt reports tightness feeling and like his kneecap is "sliding forward" today.    Limitations Standing;Lifting;Walking   How long can you stand comfortably? around 10 minutes   How long can you walk comfortably? 10 minutes   Diagnostic tests XRAYS prior to surgery    Patient Stated Goals Increase R knee ROM/ strengthening.     Currently in Pain? No/denies         OBJECTIVE: Warm up: NuStep L8 8 mins (no charge). Gait: 0.25 miles in 9 mins and 52 seconds. Verbal cues needed for R heel strike and R knee flexion. Pt started showing fatigue at 0.13 miles and given [facility] to use. With SPC, increased R knee flexion and stride length. Ambulation over the grass with SPC. Good stride length and foot clearance noted (pt had tendency to  reach for an object to pull himself up on). There ex: Side stepping with 2 BTB x 5 each side. Contract relax knee/hip flexion in supine 5 second holds x 10. Neuro Re-ed: Semi-tandem stance on airex 30 second hold (Good posture but occasional sway to grab one of the // bars. Narrow base of support on airex 30 seconds x 2 with minimal UE support (swaying side to side and use of // bars). Tandem stance 30 second hold x 2 alternating feet behind; occasional sway and grabbing // bars. Manual: Scar massage cross frictional (scabs sloughed off, good healing and new skin underneath). Patellar mobilizations grade III all 4 directions.   Pt response to Tx for medical necessity: Decreased pitting edema noted today. Increased endurance with ambulation noted with distance. Verbal cues still required for increased knee flexion-- limited by swelling. No increased c/o pain with activity.         PT Long Term Goals - 01/08/15 6962    PT LONG TERM GOAL #1   Title Pt will ascend steps with a reciprocal pattern and descend with a step to gait pattern forwards facing in order to safely enter/exit home.    Baseline step to ascending, sideways step to descending   Time 4   Period Weeks   Status New   PT LONG TERM GOAL #2   Title Pt will improve R  AROM knee flexion to 116 degrees or greater (equal to R) in order to ambulate with a normalized gait pattern.    Baseline R 103 degrees AROM    Time 4   Period Weeks   Status New   PT LONG TERM GOAL #3   Title Pt will improve LEFS score to > 50 out of 80 in order to improve functional mobility.    Baseline 43/80 on 9/27   Time 4   Period Weeks   Status New   PT LONG TERM GOAL #4   Title Pt will be independent with home exercise program in order to improve R hamstring strength by 1/2 MMT to improve gait in grocery store.    Baseline R hamstring 4/5, L 4+/5    Time 4   Period Weeks   Status New               Plan - 01/14/15 1541    Clinical Impression  Statement Pt has decreased R edema compared to last Tx session but overall increased edema compared to L side. Pt is compliant with HEP and icing after exercises/walking. Pt is able to walk 0.25 miles in 9 mins and 52 seconds. Pt made it 0.13 miles before requiring use of SPC due to fatigue and decreased R knee flexion. Pt proprioceptive responses show appropriate responses to tandem on firm and semi tandem stance on foam.    Pt will benefit from skilled therapeutic intervention in order to improve on the following deficits Abnormal gait;Decreased range of motion;Difficulty walking;Decreased endurance;Decreased balance;Decreased activity tolerance;Decreased mobility;Decreased strength;Increased edema;Impaired flexibility;Improper body mechanics;Hypomobility;Decreased scar mobility;Pain   Rehab Potential Good   PT Frequency 2x / week   PT Duration 4 weeks   PT Treatment/Interventions Cryotherapy;ADLs/Self Care Home Management;Moist Heat;Stair training;Gait training;Neuromuscular re-education;Scar mobilization;Patient/family education;Passive range of motion;Manual techniques;Therapeutic activities;Therapeutic exercise;Functional mobility training;Balance training   PT Next Visit Plan stairs/grass without AD/reassess measurements progress HEP   PT Home Exercise Plan continue with HHPT exercises    Consulted and Agree with Plan of Care Patient        Problem List There are no active problems to display for this patient.  [name], PT, DPT # [phone]   01/15/2015, 7:49 AM  [facility] [address] [place], [place], [address] Phone: [phone]   Fax:  [phone]

## 2015-01-16 ENCOUNTER — Ambulatory Visit: Payer: Medicare Other | Admitting: Physical Therapy

## 2015-01-16 DIAGNOSIS — R262 Difficulty in walking, not elsewhere classified: Secondary | ICD-10-CM

## 2015-01-16 DIAGNOSIS — M25661 Stiffness of right knee, not elsewhere classified: Secondary | ICD-10-CM | POA: Diagnosis not present

## 2015-01-17 ENCOUNTER — Encounter: Payer: Self-pay | Admitting: Physical Therapy

## 2015-01-17 NOTE — Therapy (Signed)
[facility] [address] [place], [place], [address] Phone: [phone]   Fax:  [phone]  Physical Therapy Treatment  Patient Details  Name: Roy [name]. MRN: [id number] Date of Birth: Mar 31, 1946 Referring Provider:  Gilman Buttner, MD  Encounter Date: 01/16/2015      PT End of Session - 01/17/15 1700    Visit Number 4   Number of Visits 8   Date for PT Re-Evaluation 02/04/15   Authorization - Visit Number 4   Authorization - Number of Visits 10   PT Start Time 1301   PT Stop Time 1359   PT Time Calculation (min) 58 min   Activity Tolerance Patient tolerated treatment well;No increased pain   Behavior During Therapy [facility] for tasks assessed/performed      History reviewed. No pertinent past medical history.  History reviewed. No pertinent past surgical history.  There were no vitals filed for this visit.  Visit Diagnosis:  Joint stiffness of knee, right  Difficulty walking      Subjective Assessment - 01/16/15 1301    Subjective Pt. reports L LE tired today due to increase walking this morning.  No c/o pain in R knee reported     Limitations Standing;Lifting;Walking   How long can you stand comfortably? around 10 minutes   How long can you walk comfortably? 10 minutes   Diagnostic tests XRAYS prior to surgery    Patient Stated Goals Increase R knee ROM/ strengthening.     Currently in Pain? No/denies         OBJECTIVE: Warm up: NuStep L8 10 mins (warm-up/no charge).  Gait training: ambulation over the grass with no assistive device and curb training (step ups/ downs). Good stride length and foot clearance noted (pt had tendency to reach for an object to pull himself up on). There ex: Resisted gait 2BTB all 4-planes  with 2 BTB x 10 each side. Contract-relax knee/hip flexion in supine 5 second holds x 10.  Supine R SLR 10x with holds/ hip flexion 10x.   Manual: Scar massage cross frictional (good healing  and new skin underneath). Patellar mobilizations grade III all 4 directions.  Supine R knee flexion AA/PROM 5x with static holds.    Pt response to Tx for medical necessity:  Verbal cues still required for increased knee flexion-- limited by discomfort. Improved gait pattern but occasional cuing to increase hip/knee flexion and heel strike.            PT Long Term Goals - 01/08/15 6962    PT LONG TERM GOAL #1   Title Pt will ascend steps with a reciprocal pattern and descend with a step to gait pattern forwards facing in order to safely enter/exit home.    Baseline step to ascending, sideways step to descending   Time 4   Period Weeks   Status New   PT LONG TERM GOAL #2   Title Pt will improve R AROM knee flexion to 116 degrees or greater (equal to R) in order to ambulate with a normalized gait pattern.    Baseline R 103 degrees AROM    Time 4   Period Weeks   Status New   PT LONG TERM GOAL #3   Title Pt will improve LEFS score to > 50 out of 80 in order to improve functional mobility.    Baseline 43/80 on 9/27   Time 4   Period Weeks   Status New   PT  LONG TERM GOAL #4   Title Pt will be independent with home exercise program in order to improve R hamstring strength by 1/2 MMT to improve gait in grocery store.    Baseline R hamstring 4/5, L 4+/5    Time 4   Period Weeks   Status New               Plan - 01/17/15 1701    Clinical Impression Statement Pt. ambulates around PT clinic/ outside with increase reciprocal pattern and no assistive device.  Extra time to step up on curb with no UE assist but safe.  Good scar healing and increase R knee flexion to 110 deg. after manual stretches/ mobs.    Pt will benefit from skilled therapeutic intervention in order to improve on the following deficits Abnormal gait;Decreased range of motion;Difficulty walking;Decreased endurance;Decreased balance;Decreased activity tolerance;Decreased mobility;Decreased strength;Increased  edema;Impaired flexibility;Improper body mechanics;Hypomobility;Decreased scar mobility;Pain   Rehab Potential Good   PT Frequency 2x / week   PT Duration 4 weeks   PT Treatment/Interventions Cryotherapy;ADLs/Self Care Home Management;Moist Heat;Stair training;Gait training;Neuromuscular re-education;Scar mobilization;Patient/family education;Passive range of motion;Manual techniques;Therapeutic activities;Therapeutic exercise;Functional mobility training;Balance training   PT Next Visit Plan stairs/grass without AD/reassess measurements progress HEP        Problem List There are no active problems to display for this patient.  Roy [name], PT, DPT # [id number]   01/17/2015, 5:09 PM   [facility] [address] [place], [place], [address] Phone: [phone]   Fax:  [phone]

## 2015-01-21 ENCOUNTER — Ambulatory Visit: Payer: Medicare Other | Admitting: Physical Therapy

## 2015-01-21 DIAGNOSIS — R262 Difficulty in walking, not elsewhere classified: Secondary | ICD-10-CM

## 2015-01-21 DIAGNOSIS — M25661 Stiffness of right knee, not elsewhere classified: Secondary | ICD-10-CM

## 2015-01-21 NOTE — Therapy (Signed)
[facility] [address] [place], [place], [address] Phone: [phone]   Fax:  [phone]  Physical Therapy Treatment  Patient Details  Name: Roy [name]. MRN: [id number] Date of Birth: 1946-02-18 Referring Provider:  Gilman Buttner, MD  Encounter Date: 01/21/2015      PT End of Session - 01/22/15 1531    Visit Number 5   Number of Visits 8   Date for PT Re-Evaluation 02/04/15   Authorization - Visit Number 5   Authorization - Number of Visits 10   PT Start Time 1255   PT Stop Time 1349   PT Time Calculation (min) 54 min   Activity Tolerance Patient tolerated treatment well;No increased pain   Behavior During Therapy [facility] for tasks assessed/performed      No past medical history on file.  No past surgical history on file.  There were no vitals filed for this visit.  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  Pt. reports minimal knee discomfort by increase stiffness/ pressure in joint.  No c/o ankle/lower leg swelling.     Limitations Standing;Lifting;Walking   How long can you stand comfortably? around 10 minutes   How long can you walk comfortably? 10 minutes   Diagnostic tests XRAYS prior to surgery    Patient Stated Goals Increase R knee ROM/ strengthening.     Currently in Pain? No/denies     OBJECTIVE: Warm up: NuStep L8 10 mins (warm-up/no charge). Gait training: ambulation over the grass/ ramps with no assistive device and curb training (step ups/ downs). Consistent but hesitant step pattern with grass/ step ups outside.  No LOB but extra time required with change in terrain/ positions.  There ex: Resisted gait 2BTB all 4-planes with 2 BTB x 10 each side. Supine R knee flexion/ quad sets/ SLR 10x with holds/  hip flexion 10x. Manual: Scar massage in sitting (blue mat table) with cross friction (no scabs present). Patellar mobilizations grade III all 4 directions. Supine R knee flexion AA/PROM 5x with static holds (-4 to 116 deg. At end of tx.).   Pt response to Tx for medical necessity: Verbal cues still required for increased knee flexion-- limited by discomfort. Improved gait pattern but occasional cuing to increase hip/knee flexion and heel strike.  Marked increase in overall R knee AROM today but pain with >100 deg. Flexion.          PT Long Term Goals - 01/08/15 0254    PT LONG TERM GOAL #1   Title Pt will ascend steps with a reciprocal pattern and descend with a step to gait pattern forwards facing in order to safely enter/exit home.    Baseline step to ascending, sideways step to descending   Time 4   Period Weeks   Status New   PT LONG TERM GOAL #2   Title Pt will improve R AROM knee flexion to 116 degrees or greater (equal to R) in order to ambulate with a normalized gait pattern.    Baseline R 103 degrees AROM    Time 4   Period Weeks   Status New   PT LONG TERM GOAL #3   Title Pt will improve LEFS score to > 50 out of 80 in order  to improve functional mobility.    Baseline 43/80 on 9/27   Time 4   Period Weeks   Status New   PT LONG TERM GOAL #4   Title Pt will be independent with home exercise program in order to improve R hamstring strength by 1/2 MMT to improve gait in grocery store.    Baseline R hamstring 4/5, L 4+/5    Time 4   Period Weeks   Status New               Plan - 01/22/15 1531    Clinical Impression Statement Increase R knee AROM noted after manual tx. (-4 to 116 deg.) in supine position.  Pt. reports increase knee joint pain/ pressure with >110 deg. flexion in supine or sitting posture.  Pt. ambulates with slight R antalgic gait pattern in clinic on level surfaces.  Extra time to step on 6" step with R LE.     Pt will benefit from skilled  therapeutic intervention in order to improve on the following deficits Abnormal gait;Decreased range of motion;Difficulty walking;Decreased endurance;Decreased balance;Decreased activity tolerance;Decreased mobility;Decreased strength;Increased edema;Impaired flexibility;Improper body mechanics;Hypomobility;Decreased scar mobility;Pain   Rehab Potential Good   PT Frequency 2x / week   PT Duration 4 weeks   PT Treatment/Interventions Cryotherapy;ADLs/Self Care Home Management;Moist Heat;Stair training;Gait training;Neuromuscular re-education;Scar mobilization;Patient/family education;Passive range of motion;Manual techniques;Therapeutic activities;Therapeutic exercise;Functional mobility training;Balance training   PT Next Visit Plan stairs/grass without AD/reassess measurements progress HEP   PT Home Exercise Plan continue with HHPT exercises    Consulted and Agree with Plan of Care Patient        Problem List There are no active problems to display for this patient.  [name], PT, DPT # [phone]   01/22/2015, 3:37 PM  [facility] [address] [place], [place], [address] Phone: [phone]   Fax:  [phone]

## 2015-01-23 ENCOUNTER — Ambulatory Visit: Payer: Medicare Other | Admitting: Physical Therapy

## 2015-01-23 ENCOUNTER — Encounter: Payer: Medicare Other | Admitting: Physical Therapy

## 2015-01-23 ENCOUNTER — Encounter: Payer: Self-pay | Admitting: Physical Therapy

## 2015-01-23 DIAGNOSIS — M25661 Stiffness of right knee, not elsewhere classified: Secondary | ICD-10-CM

## 2015-01-23 DIAGNOSIS — R262 Difficulty in walking, not elsewhere classified: Secondary | ICD-10-CM

## 2015-01-23 NOTE — Therapy (Signed)
[facility] [address] [place], [place], [address] Phone: [phone]   Fax:  [phone]  Physical Therapy Treatment  Patient Details  Name: Roy [name]. MRN: [id number] Date of Birth: 1946/04/09 Referring Provider:  Gilman Buttner, MD  Encounter Date: 01/23/2015      PT End of Session - 01/23/15 1529    Visit Number 6   Number of Visits 8   Date for PT Re-Evaluation 02/04/15   Authorization - Visit Number 6   Authorization - Number of Visits 10   PT Start Time 1250   PT Stop Time 1400   PT Time Calculation (min) 70 min   Activity Tolerance Patient tolerated treatment well;No increased pain   Behavior During Therapy [facility] for tasks assessed/performed      History reviewed. No pertinent past medical history.  History reviewed. No pertinent past surgical history.  There were no vitals filed for this visit.  Visit Diagnosis:  Joint stiffness of knee, right  Difficulty walking      Subjective Assessment - 01/23/15 1526    Subjective Pt reports tightness in R knee that increases throughout the day. Pt reports noticing swelling increasing throughout the day. Pt reports when he get out of bed in the morning that his knee is loose. Pt is concerned about the heat coming off of his knee.    Limitations Standing;Lifting;Walking   How long can you stand comfortably? around 10 minutes   How long can you walk comfortably? 10 minutes   Diagnostic tests XRAYS prior to surgery    Patient Stated Goals Increase R knee ROM/ strengthening.     Currently in Pain? No/denies           OBJECTIVE: Warm up: Nustep L7 10 mins (8 without UE support, 2 with UE suport). There ex: stepping over and up on objects in the // bars x6. R Step ups onto 6" step with B UE support (verbal cues needed to correct forward flexed and increased reliance on hand support) forwards and sideways x 30 each direction. Alternating every 10 single leg  stance with toe taps on the step. Gait: Ambulation around PT gym and outside on inclines and unlevel terrain with verbal cues for increased R heel strike and knee flexion. As pt fatigues, R hip compensation for R knee flexion noted. Manual: Proximal fibular head mobilizations grade III, 4 x 20 seconds. STM to R distal quad and ITB (increased tenderness and tightness noted with ITB palpation). Ice to end session in elevated position.   Pt response to Tx for medical necessity: Pt reports no increased pain with any activities. Pt limited in ambulation and R knee flexion by swelling. Pt benefits from quad strengthening to trust his R LE.         PT Education - 01/23/15 1528    Education provided Yes   Education Details Pt educated on heat and swelling post-surgical and how it is normal. Pt educated on the fact that heat and swelling may last around 12 weeks.   Person(s) Educated Patient   Methods Explanation   Comprehension Verbalized understanding             PT Long Term Goals - 01/08/15 4287    PT LONG TERM GOAL #1   Title Pt will ascend steps with a reciprocal pattern and descend with a step to gait pattern forwards facing in order to safely enter/exit home.    Baseline step to ascending, sideways  step to descending   Time 4   Period Weeks   Status New   PT LONG TERM GOAL #2   Title Pt will improve R AROM knee flexion to 116 degrees or greater (equal to R) in order to ambulate with a normalized gait pattern.    Baseline R 103 degrees AROM    Time 4   Period Weeks   Status New   PT LONG TERM GOAL #3   Title Pt will improve LEFS score to > 50 out of 80 in order to improve functional mobility.    Baseline 43/80 on 9/27   Time 4   Period Weeks   Status New   PT LONG TERM GOAL #4   Title Pt will be independent with home exercise program in order to improve R hamstring strength by 1/2 MMT to improve gait in grocery store.    Baseline R hamstring 4/5, L 4+/5    Time 4   Period  Weeks   Status New               Plan - 01/23/15 1530    Clinical Impression Statement Pt ambulates with increased R heel strike with verbal cuing. Pt is able to generate more R knee and hip flexion with gait today however is compensating with R hip for R knee tightness. Pt is able to perform step up onto 6" step with B UE support. Pt tends to use his hands to help maintain step up or stance on R LE which increases his flexed posture. Pt is able to correct posture with verbal cuing. Pt has increased pitting edema noted +2 rebound.    Pt will benefit from skilled therapeutic intervention in order to improve on the following deficits Abnormal gait;Decreased range of motion;Difficulty walking;Decreased endurance;Decreased balance;Decreased activity tolerance;Decreased mobility;Decreased strength;Increased edema;Impaired flexibility;Improper body mechanics;Hypomobility;Decreased scar mobility;Pain   Rehab Potential Good   PT Frequency 2x / week   PT Duration 4 weeks   PT Treatment/Interventions Cryotherapy;ADLs/Self Care Home Management;Moist Heat;Stair training;Gait training;Neuromuscular re-education;Scar mobilization;Patient/family education;Passive range of motion;Manual techniques;Therapeutic activities;Therapeutic exercise;Functional mobility training;Balance training   PT Next Visit Plan reassess measurements/goals/PROG note to MD/quad strengthening/ITB strengthening    PT Home Exercise Plan added side and forward step ups    Consulted and Agree with Plan of Care Patient        Problem List There are no active problems to display for this patient.  [name], PT, DPT # [phone]   01/24/2015, 8:57 AM  [facility] [address] [place], [place], [address] Phone: [phone]   Fax:  [phone]

## 2015-01-28 ENCOUNTER — Ambulatory Visit: Payer: Medicare Other | Admitting: Physical Therapy

## 2015-01-28 DIAGNOSIS — M25661 Stiffness of right knee, not elsewhere classified: Secondary | ICD-10-CM

## 2015-01-28 DIAGNOSIS — R262 Difficulty in walking, not elsewhere classified: Secondary | ICD-10-CM

## 2015-01-29 ENCOUNTER — Encounter: Payer: Self-pay | Admitting: Physical Therapy

## 2015-01-29 NOTE — Therapy (Signed)
[facility] [address] [place], [place], [address] Phone: [phone]   Fax:  [phone]  Physical Therapy Treatment  Patient Details  Name: Roy [name]. MRN: [id number] Date of Birth: Feb 14, 1946 No Data Recorded  Encounter Date: 01/28/2015      PT End of Session - 01/29/15 0810    Visit Number 7   Number of Visits 8   Date for PT Re-Evaluation 02/04/15   Authorization - Visit Number 7   Authorization - Number of Visits 10   PT Start Time 1240   PT Stop Time 1355   PT Time Calculation (min) 75 min   Activity Tolerance Patient tolerated treatment well;No increased pain   Behavior During Therapy [facility] for tasks assessed/performed      History reviewed. No pertinent past medical history.  History reviewed. No pertinent past surgical history.  There were no vitals filed for this visit.  Visit Diagnosis:  Joint stiffness of knee, right  Difficulty walking      Subjective Assessment - 01/29/15 0809    Subjective Pt reports feeling that R knee swelling has gone down. Pt states he has driven a couple of short trips but has no new complaints. Pt states that he is worried about L knee buckling.    Limitations Standing;Lifting;Walking   How long can you stand comfortably? around 10 minutes   How long can you walk comfortably? 10 minutes   Diagnostic tests XRAYS prior to surgery    Patient Stated Goals Increase R knee ROM/ strengthening.     Currently in Pain? No/denies       OBJECTIVE: Manual: STM to ITB and distal R quad. Pt reports more tenderness at distal ITB than proximal (trigger points noted). Gait training: Level and unlevel terrain with emphasis on R heel strike and R knee flexion. Good heel strike noted initially, decreased with fatigue. Pt required verbal cues to maintain toe off for swing with all ambulation. There ex: Eccentric step downs to increase quad control 10 x 2 each leg. Tandem marching and  backwards walking in // bars x 6. Step overs x20 each leg. Mini squats x 30. Monster walks with Blue TB x 20. Single leg stance and tandem stance 30 seconds x 4 each.  Pt response to Tx for medical necessity: Continued benefit from strengthening and gait training to increase heel strike and R knee flexion.          PT Long Term Goals - 01/08/15 8206    PT LONG TERM GOAL #1   Title Pt will ascend steps with a reciprocal pattern and descend with a step to gait pattern forwards facing in order to safely enter/exit home.    Baseline step to ascending, sideways step to descending   Time 4   Period Weeks   Status New   PT LONG TERM GOAL #2   Title Pt will improve R AROM knee flexion to 116 degrees or greater (equal to R) in order to ambulate with a normalized gait pattern.    Baseline R 103 degrees AROM    Time 4   Period Weeks   Status New   PT LONG TERM GOAL #3   Title Pt will improve LEFS score to > 50 out of 80 in order to improve functional mobility.    Baseline 43/80 on 9/27   Time 4   Period Weeks   Status New   PT LONG TERM GOAL #4   Title  Pt will be independent with home exercise program in order to improve R hamstring strength by 1/2 MMT to improve gait in grocery store.    Baseline R hamstring 4/5, L 4+/5    Time 4   Period Weeks   Status New            Plan - 01/29/15 1191    Clinical Impression Statement Pt able to ambulate on unlevel terrain outside with increased R heel strike and knee flexion until fatigue sets in after 5 minutes. Pt then ambulates with decreased heel strike and increased foot flat. Pt is limited with toe off during gait R compared to L with ambulation on all terrain. Pt's swelling has decreased as compared to initial evaluation, 2 inches above joint line L/R: 22inches/21.5 inches; joint line L/R: 21 inches/20.5 inches; mid gastroc L/R: 19 in/17. in. LEFS: 48/80.    Pt will benefit from skilled therapeutic intervention in order to improve on the  following deficits Abnormal gait;Decreased range of motion;Difficulty walking;Decreased endurance;Decreased balance;Decreased activity tolerance;Decreased mobility;Decreased strength;Increased edema;Impaired flexibility;Improper body mechanics;Hypomobility;Decreased scar mobility;Pain   Rehab Potential Good   PT Frequency 2x / week   PT Duration 4 weeks   PT Treatment/Interventions Cryotherapy;ADLs/Self Care Home Management;Moist Heat;Stair training;Gait training;Neuromuscular re-education;Scar mobilization;Patient/family education;Passive range of motion;Manual techniques;Therapeutic activities;Therapeutic exercise;Functional mobility training;Balance training   PT Next Visit Plan reassess measurements/goals/PROG note to MD/quad strengthening/ITB strengthening give new HEP   PT Home Exercise Plan added side and forward step ups    Consulted and Agree with Plan of Care Patient        Problem List There are no active problems to display for this patient.   Krista Blue, SPT 01/29/2015, 12:14 PM  [facility] [address] [place], [place], [address] Phone: [phone]   Fax:  [phone]  Name: [name] MRN: [id number] Date of Birth: October 20, 1945

## 2015-01-30 ENCOUNTER — Encounter: Payer: Self-pay | Admitting: Physical Therapy

## 2015-01-30 ENCOUNTER — Ambulatory Visit: Payer: Medicare Other | Admitting: Physical Therapy

## 2015-01-30 DIAGNOSIS — R262 Difficulty in walking, not elsewhere classified: Secondary | ICD-10-CM

## 2015-01-30 DIAGNOSIS — M25661 Stiffness of right knee, not elsewhere classified: Secondary | ICD-10-CM

## 2015-01-30 NOTE — Therapy (Signed)
[facility] [address] [place], [place], [address] Phone: [phone]   Fax:  [phone]  Physical Therapy Treatment  Patient Details  Name: Roy [name]. MRN: [id number] Date of Birth: [birth year] No Data Recorded  Encounter Date: 01/30/2015      PT End of Session - 01/30/15 1253    Visit Number 8   Number of Visits 8   Date for PT Re-Evaluation 02/04/15   Authorization - Visit Number 8   Authorization - Number of Visits 10   PT Start Time 3570   Activity Tolerance Patient tolerated treatment well;No increased pain   Behavior During Therapy [facility] for tasks assessed/performed      History reviewed. No pertinent past medical history.  History reviewed. No pertinent past surgical history.  There were no vitals filed for this visit.  Visit Diagnosis:  Joint stiffness of knee, right  Difficulty walking      Subjective Assessment - 01/30/15 1251    Subjective Pt reports no new complaints or pain. Pt states he thinks his legs are looking better and having decreased swelling.    Limitations Standing;Lifting;Walking   How long can you stand comfortably? around 10 minutes   How long can you walk comfortably? 10 minutes   Diagnostic tests XRAYS prior to surgery    Patient Stated Goals Increase R knee ROM/ strengthening.     Currently in Pain? No/denies       OBJECTIVE: There ex: Nustep L7 10 mins LE only (warm up, no charge). Forward stepping over obstacles x 5. Side stepping over obstacles in // bars with B UE support (minimal cues for upright posture). Toe taps with no UE support on 6" step x 30 each leg. Standing hip/knee ex. Program (in //-bars).  Reviewed HEP for standing/ supine ex.  Gait: ambulate in clinic/ outside working on consistent step pattern/ heel strike and knee flexion.  Working on obstacles outside (curb/ ramp/ grass).  Manual: supine knee AA/PROM flexion and extension.  Patellar mobs./ scar massage.     Pt. Continues to progress towards all goals with no c/o pain.  Probable discharge next tx. If all goals met.           PT Education - 01/30/15 1252    Education provided Yes   Education Details Pt given HEP including resistive 4 way hip, resistive side stepping, step ups and lunges.    Person(s) Educated Patient   Methods Explanation;Handout   Comprehension Verbalized understanding;Returned demonstration             PT Long Term Goals - 01/08/15 1779    PT LONG TERM GOAL #1   Title Pt will ascend steps with a reciprocal pattern and descend with a step to gait pattern forwards facing in order to safely enter/exit home.    Baseline step to ascending, sideways step to descending   Time 4   Period Weeks   Status New   PT LONG TERM GOAL #2   Title Pt will improve R AROM knee flexion to 116 degrees or greater (equal to R) in order to ambulate with a normalized gait pattern.    Baseline R 103 degrees AROM    Time 4   Period Weeks   Status New   PT LONG TERM GOAL #3   Title Pt will improve LEFS score to > 50 out of 80 in order to improve functional mobility.    Baseline 43/80 on 9/27   Time  4   Period Weeks   Status New   PT LONG TERM GOAL #4   Title Pt will be independent with home exercise program in order to improve R hamstring strength by 1/2 MMT to improve gait in grocery store.    Baseline R hamstring 4/5, L 4+/5    Time 4   Period Weeks   Status New               Plan - 01/29/15 0404    Clinical Impression Statement Pt able to ambulate on unlevel terrain outside with increased R heel strike and knee flexion until fatigue sets in after 5 minutes. Pt then ambulates with decreased heel strike and increased foot flat. Pt is limited with toe off during gait R compared to L with ambulation on all terrain. Pt's swelling has decreased as compared to initial evaluation, 2 inches above joint line L/R: 22inches/21.5 inches; joint line L/R: 21 inches/20.5 inches; mid  gastroc L/R: 19 in/17. in. LEFS: 48/80.    Pt will benefit from skilled therapeutic intervention in order to improve on the following deficits Abnormal gait;Decreased range of motion;Difficulty walking;Decreased endurance;Decreased balance;Decreased activity tolerance;Decreased mobility;Decreased strength;Increased edema;Impaired flexibility;Improper body mechanics;Hypomobility;Decreased scar mobility;Pain   Rehab Potential Good   PT Frequency 2x / week   PT Duration 4 weeks   PT Treatment/Interventions Cryotherapy;ADLs/Self Care Home Management;Moist Heat;Stair training;Gait training;Neuromuscular re-education;Scar mobilization;Patient/family education;Passive range of motion;Manual techniques;Therapeutic activities;Therapeutic exercise;Functional mobility training;Balance training   PT Next Visit Plan reassess measurements/goals/PROG note to MD/quad strengthening/ITB strengthening give new HEP   PT Home Exercise Plan added side and forward step ups    Consulted and Agree with Plan of Care Patient        Problem List There are no active problems to display for this patient.  Pura Spice, PT, DPT # [id number]   01/30/2015, 1:05 PM  [place] [facility] [address] [place], [place], [address] Phone: [phone]   Fax:  [phone]  Name: Ilean China. MRN: [id number] Date of Birth: [birth year]

## 2015-02-04 ENCOUNTER — Ambulatory Visit: Payer: Medicare Other | Admitting: Physical Therapy

## 2015-02-04 ENCOUNTER — Encounter: Payer: Self-pay | Admitting: Physical Therapy

## 2015-02-04 DIAGNOSIS — R262 Difficulty in walking, not elsewhere classified: Secondary | ICD-10-CM

## 2015-02-04 DIAGNOSIS — M25661 Stiffness of right knee, not elsewhere classified: Secondary | ICD-10-CM | POA: Diagnosis not present

## 2015-02-04 NOTE — Therapy (Signed)
[facility] [address] [place], [place], [address] Phone: [phone]   Fax:  [phone]  Physical Therapy Treatment  Patient Details  Name: Roy [name]. MRN: [id number] Date of Birth: 1945/10/05 No Data Recorded  Encounter Date: 02/04/2015      PT End of Session - 02/04/15 1450    Visit Number 9   Number of Visits 8   Date for PT Re-Evaluation 02/04/15   Authorization - Visit Number 9   Authorization - Number of Visits 10   PT Start Time 1240   PT Stop Time 5320   PT Time Calculation (min) 65 min   Activity Tolerance Patient tolerated treatment well;No increased pain   Behavior During Therapy [facility] for tasks assessed/performed      History reviewed. No pertinent past medical history.  History reviewed. No pertinent past surgical history.  There were no vitals filed for this visit.  Visit Diagnosis:  Joint stiffness of knee, right  Difficulty walking      Subjective Assessment - 02/04/15 1353    Subjective Pt drove to [place] on Sunday and back. Pt reports no new complaints. Pt states his physical yesterday went well and was able to come off of some of his medication.    Limitations Standing;Lifting;Walking   How long can you stand comfortably? around 10 minutes   How long can you walk comfortably? 10 minutes   Diagnostic tests XRAYS prior to surgery    Patient Stated Goals Increase R knee ROM/ strengthening.     Currently in Pain? No/denies       OBJECTIVE: Warm up: Nustep L8 LE only for 10 mins (no charge). There ex: step ups onto 2 6" steps and blue airex forwards and sideways x 30 each. Resisted gait with 2 BTB x 10 all 4 planes (forwards with R sided lunge; no L sided lunge due to increase complaint of knee discomfort). Reviewed HEP and discussed progression/continuation and need for remaining active. Gait training: Ambulation over carpet, tile, unlevel ground inclines and ramp to ensure safety with  all functional mobility. Pt independent with ambulation but demonstrates a decrease in cadence while ambulating over the grass.  Pt response to Tx for medical necessity: Pt progressed strength, balance and ROM well with PT interventions. Pt is independent with ambulation and home program; at this time, pt is discharged from skilled PT.        PT Long Term Goals - 02/04/15 1454    PT LONG TERM GOAL #1   Title Pt will ascend steps with a reciprocal pattern and descend with a step to gait pattern forwards facing in order to safely enter/exit home.    Time 4   Period Weeks   Status Achieved   PT LONG TERM GOAL #2   Title Pt will improve R AROM knee flexion to 116 degrees or greater (equal to R) in order to ambulate with a normalized gait pattern.    Baseline R 114 degrees AROM    Time 4   Period Weeks   Status Partially Met   PT LONG TERM GOAL #3   Title Pt will improve LEFS score to > 50 out of 80 in order to improve functional mobility.    Baseline 43/80 on 9/27; 56/80 on 10/25   Time 4   Period Weeks   Status Achieved   PT LONG TERM GOAL #4   Title Pt will be independent with home exercise program in order to  improve R hamstring strength by 1/2 MMT to improve gait in grocery store.    Baseline 5/5 grossly B LE   Time 4   Period Weeks   Status Achieved               Plan - 2015/03/01 1450    Clinical Impression Statement Pt ambulates with increased heel strike on the R without verbal cuing. Pt is able to maintain proper fluid reciprocal gait pattern with R knee flexion throughout entire treatement session. Pt's knee AROM: L 0 to 111 deg in supine; R -4 to 114 deg in supine. Pt's circumfrential measurments: mid gastroc L/R: 41 cm/43.4 cm; joint line L/R: 44 cm/44 cm; distal thigh L/R: 55cm/54.5 cm. Pt's MMT is grossly 5/5 B LEs. LEFS: 56/80. Pt is motivated and progressed well with short term PT strengthening. At this time pt is discharged to independnent home exercise program and  walking routine.    Pt will benefit from skilled therapeutic intervention in order to improve on the following deficits Abnormal gait;Decreased range of motion;Difficulty walking;Decreased endurance;Decreased balance;Decreased activity tolerance;Decreased mobility;Decreased strength;Increased edema;Impaired flexibility;Improper body mechanics;Hypomobility;Decreased scar mobility;Pain   Rehab Potential Good   PT Frequency 2x / week   PT Duration 4 weeks   PT Treatment/Interventions Cryotherapy;ADLs/Self Care Home Management;Moist Heat;Stair training;Gait training;Neuromuscular re-education;Scar mobilization;Patient/family education;Passive range of motion;Manual techniques;Therapeutic activities;Therapeutic exercise;Functional mobility training;Balance training   PT Home Exercise Plan continue current routine and walking with wife.    Consulted and Agree with Plan of Care Patient          G-Codes - 03-01-15 1600    Functional Assessment Tool Used LEFS/ clinical impression/ pain/ ROM   Functional Limitation Mobility: Walking and moving around   Mobility: Walking and Moving Around Current Status [phone]) At least 1 percent but less than 20 percent impaired, limited or restricted   Mobility: Walking and Moving Around Goal Status [phone]) At least 1 percent but less than 20 percent impaired, limited or restricted   Mobility: Walking and Moving Around Discharge Status [phone]) At least 1 percent but less than 20 percent impaired, limited or restricted      Problem List There are no active problems to display for this patient.  Pura Spice, PT, DPT # [id number]   02/05/2015, 9:00 AM  [facility] [address] [place], [place], [address] Phone: [phone]   Fax:  [phone]  Name: Roy [name]. MRN: [id number] Date of Birth: [birth year]

## 2015-02-06 ENCOUNTER — Encounter: Payer: Medicare Other | Admitting: Physical Therapy

## 2015-04-09 ENCOUNTER — Ambulatory Visit
Admission: EM | Admit: 2015-04-09 | Discharge: 2015-04-09 | Disposition: A | Discharge status: Home / Self Care | Payer: Medicare Other | Attending: Family Medicine | Admitting: Family Medicine

## 2015-04-09 ENCOUNTER — Encounter: Payer: Self-pay | Admitting: Emergency Medicine

## 2015-04-09 ENCOUNTER — Ambulatory Visit (INDEPENDENT_AMBULATORY_CARE_PROVIDER_SITE_OTHER): Payer: Medicare Other

## 2015-04-09 DIAGNOSIS — S91311A Laceration without foreign body, right foot, initial encounter: Secondary | ICD-10-CM

## 2015-04-09 HISTORY — DX: Essential (primary) hypertension: I10

## 2015-04-09 HISTORY — DX: Type 2 diabetes mellitus without complications: E11.9

## 2015-04-09 IMAGING — CR DG FOOT COMPLETE 3+V*R*
3 series · 3 of 3 positions shown · non-contrast
Comparison: No priors.

CLINICAL DATA: 69-year-old male with history of right foot puncture
wound on the anterior aspect of the foot near the base of the fifth
metatarsal.

EXAM:
RIGHT FOOT COMPLETE - 3+ VIEW

[foot ap]
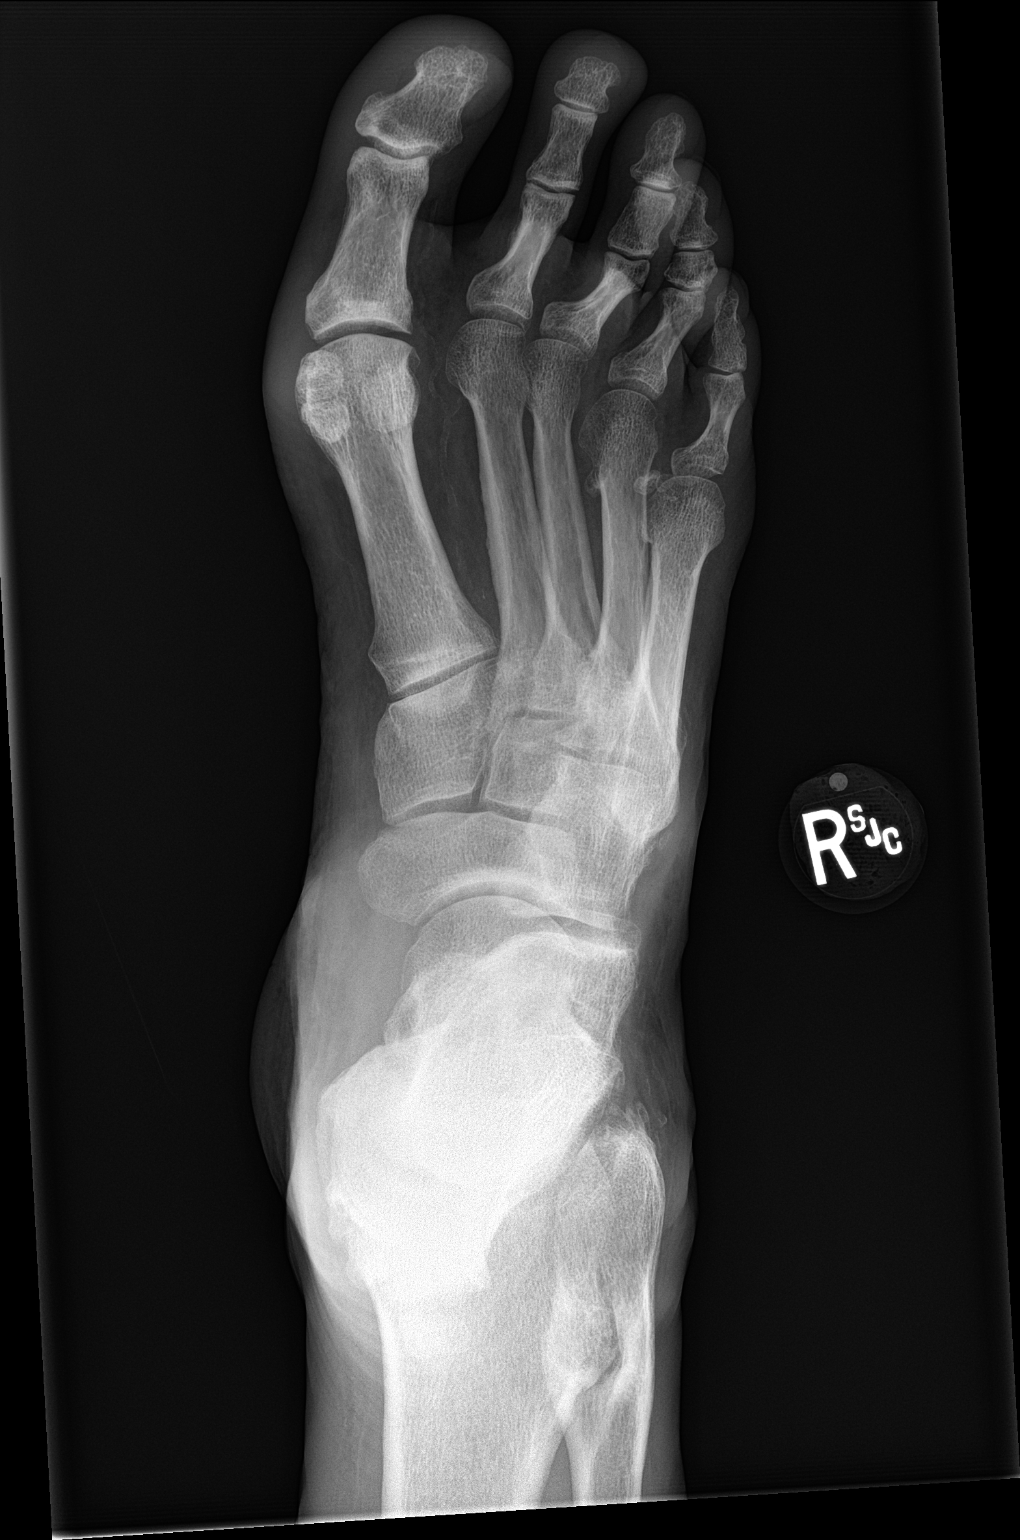

[foot obl]
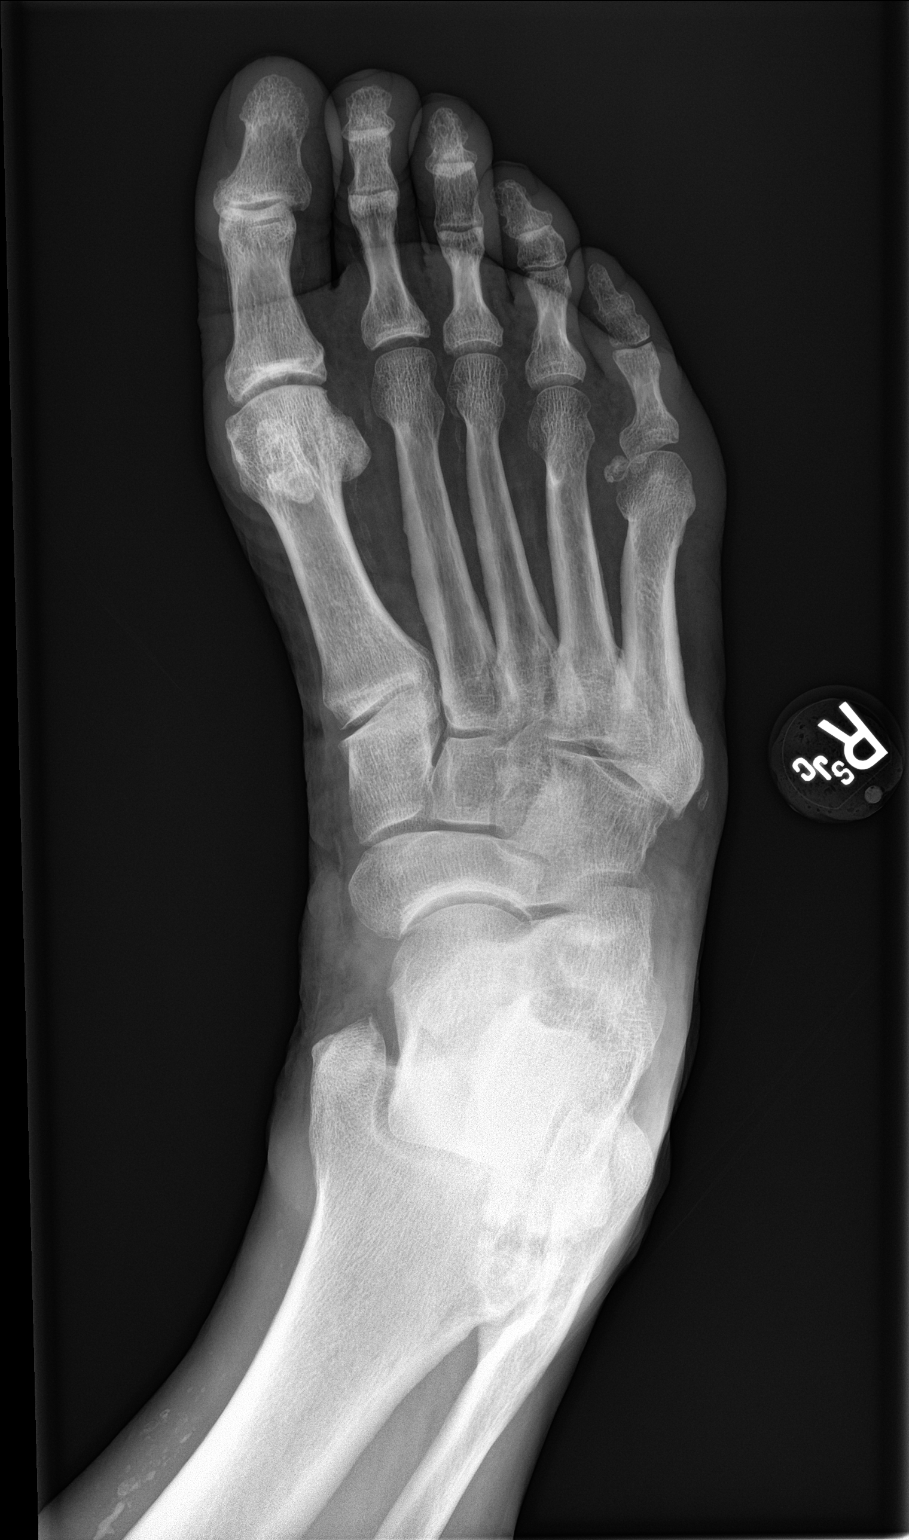

[foot lat]
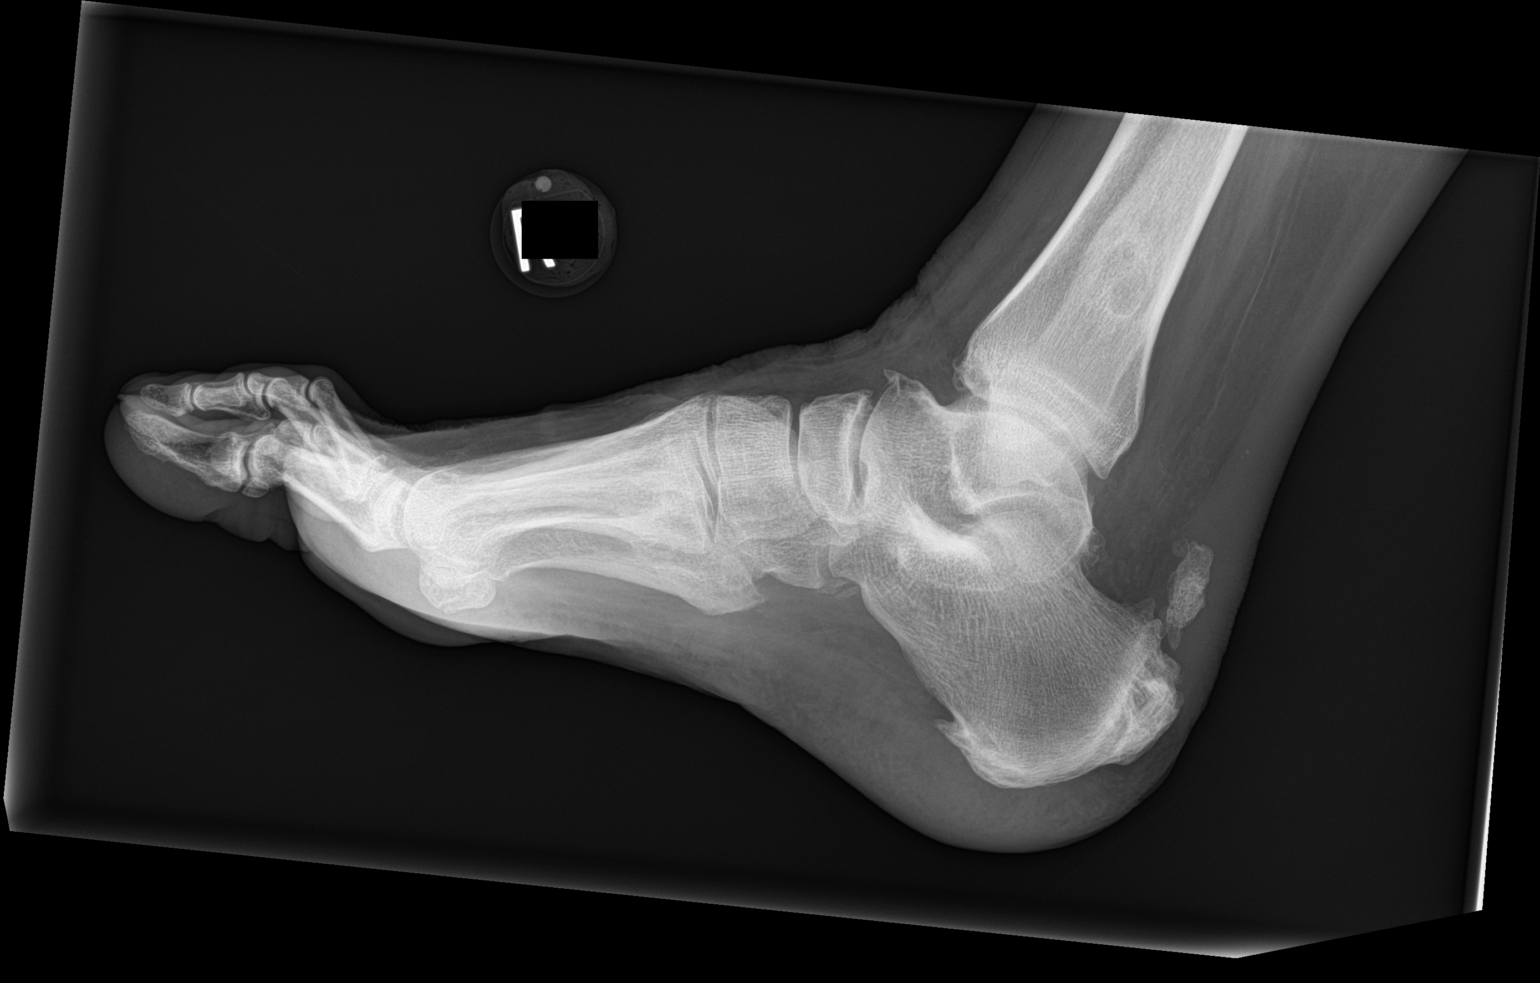

[3 of 3 positions shown; findings below may reference images not displayed]

FINDINGS: Three views of the right foot demonstrate some mild soft tissue
swelling on the lateral aspect of the foot adjacent to the base of
the fifth metatarsal. No retained radiopaque foreign body in the
soft tissues. No acute displaced fracture, subluxation or
dislocation. Extensive enthesopathic changes are noted off the
dorsal aspect of the calcaneus.
IMPRESSION: 1. No retained radiopaque foreign body in the soft tissues. Small
amount of soft tissue swelling adjacent to the base of the fifth
metatarsal at the site of reported puncture wound. No acute bony
trauma.

## 2015-04-09 MED ORDER — TETANUS-DIPHTH-ACELL PERTUSSIS 5-2.5-18.5 LF-MCG/0.5 IM SUSP
0.5000 mL | Freq: Once | INTRAMUSCULAR | Status: AC
  Administered 2015-04-09: 0.5 mL via INTRAMUSCULAR

## 2015-04-09 MED ORDER — AMOXICILLIN-POT CLAVULANATE 875-125 MG PO TABS: 1.0000 | ORAL_TABLET | Freq: Two times a day (BID) | ORAL | Status: AC

## 2015-04-09 NOTE — ED Provider Notes (Signed)
Patient presents today with symptoms of injury to the right foot with a box cutter. Patient states that approximately 2 hours ago box cutter approximately fell on his right lateral foot. It fell off of his foot after puncturing his foot. He states that he recently changed blade. Patient is diabetic. He was able to walk into the office without any problem. The area did bleed. The bleeding has now stopped. Patient does take aspirin daily.  ROS: Negative except mentioned above.  Vitals as per Epic.  GENERAL: NAD MSK: R Foot- approx.  0.5in laceration to the lateral aspect of the dorsal foot, FROM, no acute bleeding, minimal tenderness to site NEURO: CN II-XII grossly intact   A/P: R Foot Laceration- area was cleaned, sterile technique was used, the wound was approximated and Dermabond was applied, Steri-Strips were applied over the area after dry, bandage was applied to the site. Encourage patient to monitor for any signs of infection. Patient was given prophylactic Augmentin for 5 days given the fact the patient is diabetic and the wound is on the foot. Encouraged probiotic during course. Will keep the area dry for a few days. Wound care instructions given. X-rays were negative for any fracture or foreign body. TDAP given to patient prior to discharge. All seek medical attention if any further problems.   Jolene Provost, MD 04/09/15 [phone]

## 2015-04-09 NOTE — ED Notes (Signed)
Pt refused w/c. Going for xray, informed tech to use w/c, pt walking slowly with limp, recent r knee replacement.

## 2015-04-09 NOTE — ED Notes (Signed)
Last tetanus shot, unknown.

## 2015-04-09 NOTE — ED Notes (Signed)
Pt reports punctured R foot with box cutter about 2 hr PTA today.

## 2016-07-12 ENCOUNTER — Ambulatory Visit: Payer: Medicare HMO | Attending: Sports Medicine | Admitting: Physical Therapy

## 2016-07-12 DIAGNOSIS — M25551 Pain in right hip: Secondary | ICD-10-CM | POA: Diagnosis present

## 2016-07-12 DIAGNOSIS — M25661 Stiffness of right knee, not elsewhere classified: Secondary | ICD-10-CM | POA: Insufficient documentation

## 2016-07-12 DIAGNOSIS — R262 Difficulty in walking, not elsewhere classified: Secondary | ICD-10-CM | POA: Insufficient documentation

## 2016-07-12 DIAGNOSIS — R2689 Other abnormalities of gait and mobility: Secondary | ICD-10-CM

## 2016-07-12 NOTE — Therapy (Cosign Needed)
[facility] [address] [place], [place], [address] Phone: [phone]   Fax:  [phone]  Physical Therapy Evaluation  Patient Details  Name: Roy [name]. MRN: [id number] Date of Birth: [birth year] Referring Provider: Riley Lam L. Wendall Mola, MD  Encounter Date: 07/12/2016      PT End of Session - 07/12/16 1919    Visit Number 1   Number of Visits 8   Date for PT Re-Evaluation 08/09/16   Authorization - Visit Number 1   Authorization - Number of Visits 8   PT Start Time 0758   PT Stop Time 0846   PT Time Calculation (min) 48 min   Activity Tolerance Patient tolerated treatment well;Patient limited by pain      Past Medical History:  Diagnosis Date  . Diabetes mellitus without complication (HCC)   . Hypertension     Past Surgical History:  Procedure Laterality Date  . arm surgery Right    4x surgery as a child, cut arm falling through glass window   . HYDROCELE EXCISION / REPAIR    . REPLACEMENT TOTAL KNEE Right     There were no vitals filed for this visit.       Subjective Assessment - 07/12/16 1903    Subjective Patient is a pleasant 71 year old male who presents to physical therapy with R hip pain. Patient has a history of hip pain, approximately 12 years ago pt. reports having a bursitis flair of that same hip. Pt. received a shot last Tuesday but is having no relief of symptoms and will call doctor back again today after evaluation for rescheduling another shot. Pain decreases when seated and pt initially noticed new pain when he was on a cruise with his wife and had difficulty with stairs. Pt. has also had a rapid weight loss recently with 40lbs in 2 months due to severe diet change. Pt has no dizziness, fever, tingling or numbing.    Pertinent History hx of hip pain, back pain,    Limitations Lifting;Standing;Walking;House hold activities   How long can you stand comfortably? immediate pain   How long can  you walk comfortably? immediate pain   Patient Stated Goals decrease pain, return to walking protocol, be able to go to [place] at end of month with grandchildren   Currently in Pain? Yes   Pain Score 8    Pain Location Hip   Pain Orientation Right   Pain Descriptors / Indicators Aching;Sore;Tender   Pain Type Acute pain   Pain Onset 1 to 4 weeks ago   Pain Frequency Constant   Aggravating Factors  standing, walking,    Pain Relieving Factors sitting            Right Left  Hip flexion 4/5 5/5  Hip Abduction 4-/5 pain 5/5  Hip Adduction 4-/5 5/5  Knee Extension  4-/5 pain 5/5  Knee Flexion 4-/5 pain 5/5  DF 4-/5 5/5  PF 4/5 5/5   SLR- LEFS: 41/80 +FABER RLE + OBER RLE PA endfeel hard bilaterally-OA indicative Tight IT bilaterally-pain upon R IT palpation =pain that pt. Came in for STM to R IT band decreased pain levels Greater trochanter not tender to palpation-no jump sign Piriformis tight, Hip flexors tight, hamstrings tight, No leg length discrepancy noted Gait: R trendelenburg Posture: weight shifted to left.   Pt. Presents with tight R IT band causing RLE pain. Pain relieved upon STM and stretching.   TREATMENT Manual Therapy: STM  R IT band in sidelying with pillow between knees x8 min Therapeutic Exericse: Educated and performed HEP exercises (see Evaluation note).  Cues for body mechanics and proper positioning.       PT Education - 07/12/16 1919    Education provided Yes   Education Details HEP   Person(s) Educated Patient   Methods Explanation;Handout;Demonstration   Comprehension Verbalized understanding;Returned demonstration             PT Long Term Goals - 07/12/16 1928      PT LONG TERM GOAL #1   Title Patient will improve LEFS score to >60/80 for improved quality of life.    Baseline 4/2 41/80   Time 4   Period Weeks   Status New     PT LONG TERM GOAL #2   Title Patient will ambulate 10 minutes with decreased R  trendelenburg gait and pain levels to allow for greater community mobility and to return to walking program.    Baseline R trendelenburg gait, 8/10 pain   Time 4   Period Weeks   Status New     PT LONG TERM GOAL #3   Title Patient will report <3/10 pain while performing household activities to return to activities of daily life.    Baseline 8/10 pain with standing   Time 4   Period Weeks   Status New     PT LONG TERM GOAL #4   Title Patient will independent with HEP in order to improve R hip strength to 4+/5 for improved gait and muscle length.    Baseline R grossly 4-/5   Time 4   Period Weeks   Status New               Plan - 07/12/16 1925    Clinical Impression Statement Patient is a pleasant 71 y.o. male who presents to physical therapy for right hip pain. Pain is position dependent with sitting allowing for relief of 8/10 pain. Pain initially began during cruise. LEFS: 41/80. RLE is weaker than LLE and had + FABER testing. End feel bilaterally for hip joints was hard due to OA. R Trendelenburg gait pattern was noted with ambulation. IT band palpation elicited pain pt. presented to physical therapy for and was extremely tight with + OBER.  No pain elicited for trochanter and piriformis palpation although piriformis had slight tightness as well. SLR- with noted hamstring mobility deficits of RLE . STM to IT band relieved pain symptoms. Patient will benefit from skilled physical therapy to decrease pain, improve functional mobility, and return to ambulatory home program.    Rehab Potential Good   Clinical Impairments Affecting Rehab Potential hx of knee surgery, hx of hip pain   PT Frequency 2x / week   PT Duration 4 weeks   PT Treatment/Interventions ADLs/Self Care Home Management;Aquatic Therapy;Cryotherapy;Electrical Stimulation;DME Instruction;Fluidtherapy;Ultrasound;Traction;Moist Heat;Iontophoresis 4mg /ml Dexamethasone;Gait training;Stair training;Functional mobility  training;Therapeutic activities;Therapeutic exercise;Balance training;Patient/family education;Neuromuscular re-education;Manual techniques;Compression bandaging;Taping;Energy conservation;Dry needling;Passive range of motion   PT Next Visit Plan IT band STM   PT Home Exercise Plan see HEP sheet   Consulted and Agree with Plan of Care Patient      Patient will benefit from skilled therapeutic intervention in order to improve the following deficits and impairments:  Abnormal gait, Decreased activity tolerance, Decreased balance, Decreased endurance, Decreased mobility, Decreased range of motion, Difficulty walking, Decreased strength, Hypomobility, Impaired flexibility, Postural dysfunction, Improper body mechanics, Pain  Visit Diagnosis: Pain in right hip  Other abnormalities of gait and mobility  G-Codes - 07/13/16 0831    Functional Assessment Tool Used (Outpatient Only) LEFS, MMT, clinical judgement   Functional Limitation Mobility: Walking and moving around   Mobility: Walking and Moving Around Current Status [phone]) At least 20 percent but less than 40 percent impaired, limited or restricted   Mobility: Walking and Moving Around Goal Status [phone]) At least 1 percent but less than 20 percent impaired, limited or restricted       Problem List There are no active problems to display for this patient.  Precious Bard, SPT  This entire session was performed under direct supervision and direction of a licensed therapist/therapist assistant . I have personally read, edited and approve of the note as written.  Encarnacion Chu PT, DPT  07/13/2016, 8:45 AM  [facility] [address] [place], [place], [address] Phone: [phone]   Fax:  [phone]  Name: Roy [name]. MRN: [id number] Date of Birth: Jan 21, 1946

## 2016-07-15 ENCOUNTER — Encounter: Payer: Self-pay | Admitting: Physical Therapy

## 2016-07-15 ENCOUNTER — Ambulatory Visit: Payer: Medicare HMO | Admitting: Physical Therapy

## 2016-07-15 DIAGNOSIS — R2689 Other abnormalities of gait and mobility: Secondary | ICD-10-CM

## 2016-07-15 DIAGNOSIS — M25551 Pain in right hip: Secondary | ICD-10-CM | POA: Diagnosis not present

## 2016-07-15 NOTE — Therapy (Cosign Needed)
[facility] [address] [place], [place], [address] Phone: [phone]   Fax:  [phone]  Physical Therapy Treatment  Patient Details  Name: Roy [name]. MRN: [id number] Date of Birth: 03/03/1946 Referring Provider: Riley Lam L. Wendall Mola, MD  Encounter Date: 07/15/2016      PT End of Session - 07/15/16 0853    Visit Number 2   Number of Visits 8   Date for PT Re-Evaluation 08/09/16   Authorization - Visit Number 2   Authorization - Number of Visits 8   PT Start Time 0756   PT Stop Time 0847   PT Time Calculation (min) 51 min   Activity Tolerance Patient tolerated treatment well;Patient limited by pain      Past Medical History:  Diagnosis Date  . Diabetes mellitus without complication (HCC)   . Hypertension     Past Surgical History:  Procedure Laterality Date  . arm surgery Right    4x surgery as a child, cut arm falling through glass window   . HYDROCELE EXCISION / REPAIR    . REPLACEMENT TOTAL KNEE Right     There were no vitals filed for this visit.      Subjective Assessment - 07/15/16 0755    Subjective Pt. doctor said to hold off of shot since only been 6 days and to keep with PT and talk again next monday. Had some pain relief from other session but began to come back this morning. Leaving the 25th for a trip.    Pertinent History hx of hip pain, back pain,    Limitations Lifting;Standing;Walking;House hold activities   How long can you stand comfortably? immediate pain   How long can you walk comfortably? immediate pain   Patient Stated Goals decrease pain, return to walking protocol, be able to go to [place] at end of month with grandchildren   Currently in Pain? Yes   Pain Score 8    Pain Location Hip   Pain Orientation Right   Pain Descriptors / Indicators Aching   Pain Onset 1 to 4 weeks ago     Manual: Side lying with pillow b/w legs and R side up: Deep tissue massage to R IT band,  deep pressure application, muscle tension techniques implemented with massage cream and proper draping of patient. STM to gluteal region of RLE. Isometric gluteal activation 15x10 seconds in seated position. PROM IT band side lying  TherEx Large steps in // bars with focus on heel strike for proper gait mechanics Weight shifting in shoulder width BOS and tandem stance BOS with slight pain noted in R hip. Step facilitation with tactile cues to gluteals for activation. Glute squeezes in standing (squeeze 100 dollar bill) 10x 5 sec hold   Pt. will benefit from skilled physical therapy to decrease pain, improve functional mobility, and return to home ambulatory program        PT Education - 07/15/16 [phone]    Education provided Yes   Education Details gluteal squeezing, how to have wife help do IT band soft tissue work   [employer]) Educated Patient   Methods Explanation;Demonstration   Comprehension Verbalized understanding             PT Long Term Goals - 07/12/16 1928      PT LONG TERM GOAL #1   Title Patient will improve LEFS score to >60/80 for improved quality of life.    Baseline 4/2 41/80   Time 4  Period Weeks   Status New     PT LONG TERM GOAL #2   Title Patient will ambulate 10 minutes with decreased R trendelenburg gait and pain levels to allow for greater community mobility and to return to walking program.    Baseline R trendelenburg gait, 8/10 pain   Time 4   Period Weeks   Status New     PT LONG TERM GOAL #3   Title Patient will report <3/10 pain while performing household activities to return to activities of daily life.    Baseline 8/10 pain with standing   Time 4   Period Weeks   Status New     PT LONG TERM GOAL #4   Title Patient will independent with HEP in order to improve R hip strength to 4+/5 for improved gait and muscle length.    Baseline R grossly 4-/5   Time 4   Period Weeks   Status New               Plan - 07/15/16 0905     Clinical Impression Statement Patient tolerated R IT deep tissue massage in side lying position with pillow between legs and active release techniques. STM to gluteals also performed. Relief of symptoms post IT deep tissue massage. Pt. educated on how to have wife help at home. Education on weak gluteal muscles and activation of weak gluteals to prevent excessive use of IT bands. Isometric gluteal exercises were beneficial and implemented. Body mechanics were challenging to pt. with frequent compensations utilized for weakness and other injuries. Pt. will benefit from skilled physical therapy to decrease pain, improve functional mobility, and return to home ambulatory program.    Rehab Potential Good   Clinical Impairments Affecting Rehab Potential hx of knee surgery, hx of hip pain   PT Frequency 2x / week   PT Duration 4 weeks   PT Treatment/Interventions ADLs/Self Care Home Management;Aquatic Therapy;Cryotherapy;Electrical Stimulation;DME Instruction;Fluidtherapy;Ultrasound;Traction;Moist Heat;Iontophoresis 4mg /ml Dexamethasone;Gait training;Stair training;Functional mobility training;Therapeutic activities;Therapeutic exercise;Balance training;Patient/family education;Neuromuscular re-education;Manual techniques;Compression bandaging;Taping;Energy conservation;Dry needling;Passive range of motion   PT Next Visit Plan IT band STM, gluteal squeezes and isometrics, stair   PT Home Exercise Plan see HEP sheet   Consulted and Agree with Plan of Care Patient      Patient will benefit from skilled therapeutic intervention in order to improve the following deficits and impairments:  Abnormal gait, Decreased activity tolerance, Decreased balance, Decreased endurance, Decreased mobility, Decreased range of motion, Difficulty walking, Decreased strength, Hypomobility, Impaired flexibility, Postural dysfunction, Improper body mechanics, Pain  Visit Diagnosis: Pain in right hip  Other abnormalities of gait  and mobility     Problem List There are no active problems to display for this patient.  Precious Bard, SPT  This entire session was performed under direct supervision and direction of a licensed therapist/therapist assistant . I have personally read, edited and approve of the note as written. Encarnacion Chu PT, DPT 07/15/2016, 11:27 AM  [facility] [address] [place], [place], [address] Phone: [phone]   Fax:  [phone]  Name: Roy [name]. MRN: [id number] Date of Birth: [birth year]

## 2016-07-19 ENCOUNTER — Ambulatory Visit: Payer: Medicare HMO | Admitting: Physical Therapy

## 2016-07-19 ENCOUNTER — Encounter: Payer: Self-pay | Admitting: Physical Therapy

## 2016-07-19 DIAGNOSIS — R2689 Other abnormalities of gait and mobility: Secondary | ICD-10-CM

## 2016-07-19 DIAGNOSIS — M25551 Pain in right hip: Secondary | ICD-10-CM | POA: Diagnosis not present

## 2016-07-19 NOTE — Therapy (Signed)
[facility] [address] [place], [place], [address] Phone: [phone]   Fax:  [phone]  Physical Therapy Treatment  Patient Details  Name: Roy [name]. MRN: [id number] Date of Birth: [birth year] Referring Provider: Riley Lam L. Wendall Mola, MD  Encounter Date: 07/19/2016      PT End of Session - 07/19/16 1249    Visit Number 3   Number of Visits 8   Date for PT Re-Evaluation 08/09/16   Authorization - Visit Number 3   Authorization - Number of Visits 8   PT Start Time 0944   PT Stop Time 1031   PT Time Calculation (min) 47 min   Activity Tolerance Patient tolerated treatment well;Patient limited by pain   Behavior During Therapy [facility] for tasks assessed/performed      Past Medical History:  Diagnosis Date  . Diabetes mellitus without complication (HCC)   . Hypertension     Past Surgical History:  Procedure Laterality Date  . arm surgery Right    4x surgery as a child, cut arm falling through glass window   . HYDROCELE EXCISION / REPAIR    . REPLACEMENT TOTAL KNEE Right     There were no vitals filed for this visit.      Subjective Assessment - 07/19/16 0948    Subjective Pt. mowed lawn and weed-wacked this weekend. He c/o of pain that started hurting about an hour afterwards and only was relieved by ice. Wants to go back to doctor for another shot soon if pain does not relieve due to upcoming family trip to [place].    Pertinent History hx of hip pain, back pain,    Limitations Lifting;Standing;Walking;House hold activities   How long can you stand comfortably? immediate pain   How long can you walk comfortably? immediate pain   Patient Stated Goals decrease pain, return to walking protocol, be able to go to [place] at end of month with grandchildren   Currently in Pain? Yes   Pain Score 8    Pain Location Hip   Pain Orientation Right   Pain Onset 1 to 4 weeks ago    Manual: Side lying with pillow  b/w legs and R side up: Deep tissue massage to R IT band, deep pressure application, muscle tension techniques implemented with massage cream and proper draping of patient. STM to gluteal and distal quadricep region of RLE. PROM IT band side lying, hip flexion supine, hamstring supine, IR/ER  TherEx Gluteal squeezes in seated (squeeze 100 dollar bill) 10x 5 sec hold Isometric gluteal activation 10x10 seconds in seated position. Isometric R Hip abd, add, 10x5 seconds   Pt. will benefit from skilled physical therapy to decrease pain, improve functional mobility, and return to home ambulatory program       PT Education - 07/19/16 1248    Education provided Yes   Education Details not performing weed-wacking/gardening exercises at this time.    Person(s) Educated Patient   Methods Explanation   Comprehension Verbalized understanding             PT Long Term Goals - 07/12/16 1928      PT LONG TERM GOAL #1   Title Patient will improve LEFS score to >60/80 for improved quality of life.    Baseline 4/2 41/80   Time 4   Period Weeks   Status New     PT LONG TERM GOAL #2   Title Patient will ambulate 10 minutes with decreased R  trendelenburg gait and pain levels to allow for greater community mobility and to return to walking program.    Baseline R trendelenburg gait, 8/10 pain   Time 4   Period Weeks   Status New     PT LONG TERM GOAL #3   Title Patient will report <3/10 pain while performing household activities to return to activities of daily life.    Baseline 8/10 pain with standing   Time 4   Period Weeks   Status New     PT LONG TERM GOAL #4   Title Patient will independent with HEP in order to improve R hip strength to 4+/5 for improved gait and muscle length.    Baseline R grossly 4-/5   Time 4   Period Weeks   Status New               Plan - 07/19/16 1259    Clinical Impression Statement Patient continues to have pain in R IT band region with  increased muscular tightness of surrounding tissue. Pt. was advised to not weed-wack at this time due to the rotational component and demand on his body. R IT band deep tissue massage in sidelying position with pillow between legs was performed with active release techniques. Pain noted during manual with decreased pain post treatment. PROM of surrounding hip musculature performed with pt. demonstrating improved gait mechanics. Isometrics and gluteal squeezes performed with pt. demonstrating understanding of the difference between utilizing main muscle groups and compensatory muscle groups. Patient will benefit from skilled physical therapy to decrease pain, improve functional mobility, and return to home ambulatory program   Rehab Potential Good   Clinical Impairments Affecting Rehab Potential hx of knee surgery, hx of hip pain   PT Frequency 2x / week   PT Duration 4 weeks   PT Treatment/Interventions ADLs/Self Care Home Management;Aquatic Therapy;Cryotherapy;Electrical Stimulation;DME Instruction;Fluidtherapy;Ultrasound;Traction;Moist Heat;Iontophoresis 4mg /ml Dexamethasone;Gait training;Stair training;Functional mobility training;Therapeutic activities;Therapeutic exercise;Balance training;Patient/family education;Neuromuscular re-education;Manual techniques;Compression bandaging;Taping;Energy conservation;Dry needling;Passive range of motion   PT Next Visit Plan IT band STM, gluteal squeezes and isometrics, stair   PT Home Exercise Plan see HEP sheet   Consulted and Agree with Plan of Care Patient      Patient will benefit from skilled therapeutic intervention in order to improve the following deficits and impairments:  Abnormal gait, Decreased activity tolerance, Decreased balance, Decreased endurance, Decreased mobility, Decreased range of motion, Difficulty walking, Decreased strength, Hypomobility, Impaired flexibility, Postural dysfunction, Improper body mechanics, Pain  Visit  Diagnosis: Pain in right hip  Other abnormalities of gait and mobility     Problem List There are no active problems to display for this patient.  Precious Bard, SPT  This entire session was performed under direct supervision and direction of a licensed therapist/therapist assistant . I have personally read, edited and approve of the note as written.  Encarnacion Chu PT, DPT 07/19/2016, 3:38 PM  [facility] [address] [place], [place], [address] Phone: [phone]   Fax:  [phone]  Name: [name] MRN: [id number] Date of Birth: October 16, 1945

## 2016-07-21 ENCOUNTER — Ambulatory Visit: Payer: Medicare HMO | Admitting: Physical Therapy

## 2016-07-21 DIAGNOSIS — R2689 Other abnormalities of gait and mobility: Secondary | ICD-10-CM

## 2016-07-21 DIAGNOSIS — M25551 Pain in right hip: Secondary | ICD-10-CM | POA: Diagnosis not present

## 2016-07-21 DIAGNOSIS — M25661 Stiffness of right knee, not elsewhere classified: Secondary | ICD-10-CM

## 2016-07-21 DIAGNOSIS — R262 Difficulty in walking, not elsewhere classified: Secondary | ICD-10-CM

## 2016-07-22 ENCOUNTER — Encounter: Payer: Self-pay | Admitting: Physical Therapy

## 2016-07-22 NOTE — Therapy (Signed)
[facility] [address] [place], [place], [address] Phone: [phone]   Fax:  [phone]  Physical Therapy Treatment  Patient Details  Name: Roy [name]. MRN: [id number] Date of Birth: 08/17/1945 Referring Provider: Riley Lam L. Wendall Mola, MD  Encounter Date: 07/21/2016      PT End of Session - 07/22/16 1504    Visit Number 4   Number of Visits 8   Date for PT Re-Evaluation 08/09/16   Authorization - Visit Number 4   Authorization - Number of Visits 8   PT Start Time [phone]   PT Stop Time 1039   PT Time Calculation (min) 57 min   Activity Tolerance Patient tolerated treatment well;Patient limited by pain   Behavior During Therapy [facility] for tasks assessed/performed      Past Medical History:  Diagnosis Date  . Diabetes mellitus without complication (HCC)   . Hypertension     Past Surgical History:  Procedure Laterality Date  . arm surgery Right    4x surgery as a child, cut arm falling through glass window   . HYDROCELE EXCISION / REPAIR    . REPLACEMENT TOTAL KNEE Right     There were no vitals filed for this visit.      Subjective Assessment - 07/22/16 1502    Subjective Pt. states R hip is hurting today.  Pt. wants to know if he should have another injection in hip prior to [facility] vacation.     Pertinent History hx of hip pain, back pain,    Limitations Lifting;Standing;Walking;House hold activities   How long can you stand comfortably? immediate pain   How long can you walk comfortably? immediate pain   Patient Stated Goals decrease pain, return to walking protocol, be able to go to [place] at end of month with grandchildren   Currently in Pain? Yes   Pain Score 7    Pain Location Hip   Pain Orientation Right   Pain Descriptors / Indicators Aching      Manual: Side lying with pillow b/w legs and R side up: Deep tissue massage to R IT band, deep pressure application, muscle tension techniques  implemented with massage cream and proper draping of patient. STM to gluteal and distal quadricep region of RLE. PROM IT band side lying, hip flexion supine, hamstring supine, IR/ER  TherEx Gluteal squeezes in seated (squeeze 100 dollar bill) 10x 5 sec hold Isometric gluteal activation 10x10 seconds in seated position. Isometric R Hip abd, add, 10x5 seconds  ICE to R hip in L sidelying after tx.    Pt. will benefit from skilled physical therapy to decrease pain, improve functional mobility, and return to home ambulatory program         PT Long Term Goals - 07/12/16 1928      PT LONG TERM GOAL #1   Title Patient will improve LEFS score to >60/80 for improved quality of life.    Baseline 4/2 41/80   Time 4   Period Weeks   Status New     PT LONG TERM GOAL #2   Title Patient will ambulate 10 minutes with decreased R trendelenburg gait and pain levels to allow for greater community mobility and to return to walking program.    Baseline R trendelenburg gait, 8/10 pain   Time 4   Period Weeks   Status New     PT LONG TERM GOAL #3   Title Patient will report <3/10 pain while performing  household activities to return to activities of daily life.    Baseline 8/10 pain with standing   Time 4   Period Weeks   Status New     PT LONG TERM GOAL #4   Title Patient will independent with HEP in order to improve R hip strength to 4+/5 for improved gait and muscle length.    Baseline R grossly 4-/5   Time 4   Period Weeks   Status New            Plan - 07/22/16 1504    Clinical Impression Statement Significant R hip/ITB tenderness and pain with palpation and stretching in supine/sidelying position.  Significant muscle tightness noted in B hip musculature, esp. ITB.  Pt. encouraged to participate with daily stretches/ HEP and avoid heavy duty or pain provoking activities.     Rehab Potential Good   Clinical Impairments Affecting Rehab Potential hx of knee surgery, hx of hip  pain   PT Frequency 2x / week   PT Duration 4 weeks   PT Treatment/Interventions ADLs/Self Care Home Management;Aquatic Therapy;Cryotherapy;Electrical Stimulation;DME Instruction;Fluidtherapy;Ultrasound;Traction;Moist Heat;Iontophoresis 4mg /ml Dexamethasone;Gait training;Stair training;Functional mobility training;Therapeutic activities;Therapeutic exercise;Balance training;Patient/family education;Neuromuscular re-education;Manual techniques;Compression bandaging;Taping;Energy conservation;Dry needling;Passive range of motion   PT Next Visit Plan IT band STM, gluteal squeezes and isometrics, stair.  Discuss if pt. called MD about injection prior to [employer] trip.     PT Home Exercise Plan see HEP sheet   Consulted and Agree with Plan of Care Patient      Patient will benefit from skilled therapeutic intervention in order to improve the following deficits and impairments:  Abnormal gait, Decreased activity tolerance, Decreased balance, Decreased endurance, Decreased mobility, Decreased range of motion, Difficulty walking, Decreased strength, Hypomobility, Impaired flexibility, Postural dysfunction, Improper body mechanics, Pain  Visit Diagnosis: Pain in right hip  Other abnormalities of gait and mobility  Joint stiffness of knee, right  Difficulty walking     Problem List There are no active problems to display for this patient.  [name], PT, DPT # [phone] 07/22/2016, 3:25 PM  [facility] [address] [place], [place], [address] Phone: [phone]   Fax:  [phone]  Name: Roy [name]. MRN: [id number] Date of Birth: 1945/04/25

## 2016-07-26 ENCOUNTER — Ambulatory Visit: Payer: Medicare HMO | Admitting: Physical Therapy

## 2016-07-26 DIAGNOSIS — R2689 Other abnormalities of gait and mobility: Secondary | ICD-10-CM

## 2016-07-26 DIAGNOSIS — M25661 Stiffness of right knee, not elsewhere classified: Secondary | ICD-10-CM

## 2016-07-26 DIAGNOSIS — R262 Difficulty in walking, not elsewhere classified: Secondary | ICD-10-CM

## 2016-07-26 DIAGNOSIS — M25551 Pain in right hip: Secondary | ICD-10-CM

## 2016-07-26 NOTE — Therapy (Signed)
[facility] [address] [place], [place], [address] Phone: [phone]   Fax:  [phone]  Physical Therapy Treatment  Patient Details  Name: Roy [name]. MRN: [id number] Date of Birth: 1946/03/17 Referring Provider: Riley Lam L. Wendall Mola, MD  Encounter Date: 07/26/2016      PT End of Session - 07/26/16 1549    Visit Number 5   Number of Visits 8   Date for PT Re-Evaluation 08/09/16   Authorization - Visit Number 5   Authorization - Number of Visits 8   PT Start Time 0941   PT Stop Time 1029   PT Time Calculation (min) 48 min   Activity Tolerance Patient tolerated treatment well;Patient limited by pain   Behavior During Therapy [facility] for tasks assessed/performed      Past Medical History:  Diagnosis Date  . Diabetes mellitus without complication (HCC)   . Hypertension     Past Surgical History:  Procedure Laterality Date  . arm surgery Right    4x surgery as a child, cut arm falling through glass window   . HYDROCELE EXCISION / REPAIR    . REPLACEMENT TOTAL KNEE Right     There were no vitals filed for this visit.      Subjective Assessment - 07/26/16 1547    Subjective Patient has talked with doctor and will meet for another shot in the following week. Pt. was able to attend grandchild's softball game without pain.    Pertinent History hx of hip pain, back pain,    Limitations Lifting;Standing;Walking;House hold activities   How long can you stand comfortably? immediate pain   How long can you walk comfortably? immediate pain   Patient Stated Goals decrease pain, return to walking protocol, be able to go to [place] at end of month with grandchildren   Currently in Pain? Yes   Pain Score 8    Pain Location Hip   Pain Orientation Right   Pain Descriptors / Indicators Aching       Manual: Side lying with pillow b/w legs and R side up: Deep tissue massage to R IT band, deep pressure application,  muscle tension techniques implemented with massage cream and proper draping of patient. STM to gluteal and distal quadricepregion of RLE. PROM IT band side lying, hip flexion supine, hamstring supine, IR/ER, hamstring popliteal angle.   TherEx Glutealsqueezes in seated (squeeze 100 dollar bill) 10x 5 sec hold Quad sets 10x. Stepping over 3" planks, 6x. Cues for body mechanics with stepping. Stepping body ambulation mechanics in // bars to decrease jolting and IT band pain  Pt. will benefit from skilled physical therapy to decrease pain, improve functional mobility, and return to home ambulatory program       PT Long Term Goals - 07/12/16 1928      PT LONG TERM GOAL #1   Title Patient will improve LEFS score to >60/80 for improved quality of life.    Baseline 4/2 41/80   Time 4   Period Weeks   Status New     PT LONG TERM GOAL #2   Title Patient will ambulate 10 minutes with decreased R trendelenburg gait and pain levels to allow for greater community mobility and to return to walking program.    Baseline R trendelenburg gait, 8/10 pain   Time 4   Period Weeks   Status New     PT LONG TERM GOAL #3   Title Patient will report <3/10 pain  while performing household activities to return to activities of daily life.    Baseline 8/10 pain with standing   Time 4   Period Weeks   Status New     PT LONG TERM GOAL #4   Title Patient will independent with HEP in order to improve R hip strength to 4+/5 for improved gait and muscle length.    Baseline R grossly 4-/5   Time 4   Period Weeks   Status New            Plan - 07/26/16 1552    Clinical Impression Statement IT band tightness continues to be pain dominant to patient however more pressure was able to be implemented during STM demonstrating improvements. Stretching in all planes was performed with relief of symptoms. Standing continues to be painful to patient and pt. was educated on ambulation techniques and stepping  over objects to reduce pain of R IT band. Patient will continue to benefit from skilled physical therapy to decrease pain, improve functional mobility, and return to home ambulatory program.    Rehab Potential Good   Clinical Impairments Affecting Rehab Potential hx of knee surgery, hx of hip pain   PT Frequency 2x / week   PT Duration 4 weeks   PT Treatment/Interventions ADLs/Self Care Home Management;Aquatic Therapy;Cryotherapy;Electrical Stimulation;DME Instruction;Fluidtherapy;Ultrasound;Traction;Moist Heat;Iontophoresis 4mg /ml Dexamethasone;Gait training;Stair training;Functional mobility training;Therapeutic activities;Therapeutic exercise;Balance training;Patient/family education;Neuromuscular re-education;Manual techniques;Compression bandaging;Taping;Energy conservation;Dry needling;Passive range of motion   PT Next Visit Plan look at new shoes, gait mechanics, IT band STM   PT Home Exercise Plan see HEP sheet   Consulted and Agree with Plan of Care Patient      Patient will benefit from skilled therapeutic intervention in order to improve the following deficits and impairments:  Abnormal gait, Decreased activity tolerance, Decreased balance, Decreased endurance, Decreased mobility, Decreased range of motion, Difficulty walking, Decreased strength, Hypomobility, Impaired flexibility, Postural dysfunction, Improper body mechanics, Pain  Visit Diagnosis: Pain in right hip  Other abnormalities of gait and mobility  Joint stiffness of knee, right  Difficulty walking     Problem List There are no active problems to display for this patient.  This entire session was performed under direct supervision and direction of a licensed therapist/therapist assistant. I have personally read, edited and approve of the note as written.  [name], PT, DPT # [id number] Precious Bard, SPT 07/27/2016, 7:07 AM  [facility] [address] [place], [place], [address] Phone: [phone]   Fax:  [phone]  Name: [name] MRN: [id number] Date of Birth: 09-29-1945

## 2016-07-27 ENCOUNTER — Encounter: Payer: Self-pay | Admitting: Physical Therapy

## 2016-07-28 ENCOUNTER — Ambulatory Visit: Payer: Medicare HMO | Admitting: Physical Therapy

## 2016-07-28 DIAGNOSIS — M25551 Pain in right hip: Secondary | ICD-10-CM

## 2016-07-28 DIAGNOSIS — R2689 Other abnormalities of gait and mobility: Secondary | ICD-10-CM

## 2016-07-28 DIAGNOSIS — M25661 Stiffness of right knee, not elsewhere classified: Secondary | ICD-10-CM

## 2016-07-28 DIAGNOSIS — R262 Difficulty in walking, not elsewhere classified: Secondary | ICD-10-CM

## 2016-07-29 ENCOUNTER — Encounter: Payer: Self-pay | Admitting: Physical Therapy

## 2016-07-29 NOTE — Therapy (Addendum)
[facility] [address] [place], [place], [address] Phone: [phone]   Fax:  [phone]  Physical Therapy Treatment  Patient Details  Name: Roy [name]. MRN: [id number] Date of Birth: [birth year] Referring Provider: Riley Lam L. Wendall Mola, MD  Encounter Date: 07/28/2016      PT End of Session - 07/29/16 1217    Visit Number 6   Number of Visits 8   Date for PT Re-Evaluation 08/09/16   Authorization - Visit Number 6   Authorization - Number of Visits 8   PT Start Time 0934   PT Stop Time 1031   PT Time Calculation (min) 57 min   Activity Tolerance Patient tolerated treatment well;Patient limited by pain   Behavior During Therapy [facility] for tasks assessed/performed      Past Medical History:  Diagnosis Date  . Diabetes mellitus without complication (HCC)   . Hypertension     Past Surgical History:  Procedure Laterality Date  . arm surgery Right    4x surgery as a child, cut arm falling through glass window   . HYDROCELE EXCISION / REPAIR    . REPLACEMENT TOTAL KNEE Right     There were no vitals filed for this visit.      Subjective Assessment - 07/29/16 1217    Pertinent History hx of hip pain, back pain,    Limitations Lifting;Standing;Walking;House hold activities   How long can you stand comfortably? immediate pain   How long can you walk comfortably? immediate pain   Diagnostic tests XRAYS prior to surgery    Patient Stated Goals decrease pain, return to walking protocol, be able to go to [place] at end of month with grandchildren   Currently in Pain? Yes   Pain Score 8    Pain Location Hip   Pain Orientation Right      Pt. states he is in a lot of pain in R hip this morning.  Pt. states the pain is worse than during cruise.  Pt. has MD appt. on Thursday for injection and is planning on asking about Prednisone while on a trip to [place].      Manual: L Sidelying with pillow b/w legs and R  side up: Deep tissue massage to R IT band, deep pressure application, muscle tension techniques implemented with massage cream and proper draping of patient. STM to gluteal and distal quadricep region of RLE. Supine/L sidelying IT/ hip flexion/ hamstring/ hip IR/ER stretches.    TherEx Assessment of gait with use of SPC on L (no benefit). Stepping over 3" planks, 6x. Cues for body mechanics with stepping. Stepping body ambulation mechanics in // bars to decrease jolting and IT band pain    Pt. will benefit from skilled physical therapy to decrease pain, improve functional mobility, and return to home ambulatory program      Pt. IT band tightness and generalized point tenderness from R greater trocanter to mid-IT band present today.  Pt. has difficulty tolerating palpation/ STM during tx. session due to high levels of pain.  PT recommends pt. return to MD to discuss severity of pain/injection.  No benefit from use of SPC on L to counterbalance R hip.  PT discussed shoes and benefits from orthotics.           PT Long Term Goals - 07/12/16 1928      PT LONG TERM GOAL #1   Title Patient will improve LEFS score to >60/80 for improved quality of  life.    Baseline 4/2 41/80   Time 4   Period Weeks   Status New     PT LONG TERM GOAL #2   Title Patient will ambulate 10 minutes with decreased R trendelenburg gait and pain levels to allow for greater community mobility and to return to walking program.    Baseline R trendelenburg gait, 8/10 pain   Time 4   Period Weeks   Status New     PT LONG TERM GOAL #3   Title Patient will report <3/10 pain while performing household activities to return to activities of daily life.    Baseline 8/10 pain with standing   Time 4   Period Weeks   Status New     PT LONG TERM GOAL #4   Title Patient will independent with HEP in order to improve R hip strength to 4+/5 for improved gait and muscle length.    Baseline R grossly 4-/5   Time 4    Period Weeks   Status New               Plan - 07/29/16 1218    Rehab Potential Good   Clinical Impairments Affecting Rehab Potential hx of knee surgery, hx of hip pain   PT Frequency 2x / week   PT Duration 4 weeks   PT Treatment/Interventions ADLs/Self Care Home Management;Aquatic Therapy;Cryotherapy;Electrical Stimulation;DME Instruction;Fluidtherapy;Ultrasound;Traction;Moist Heat;Iontophoresis 4mg /ml Dexamethasone;Gait training;Stair training;Functional mobility training;Therapeutic activities;Therapeutic exercise;Balance training;Patient/family education;Neuromuscular re-education;Manual techniques;Compression bandaging;Taping;Energy conservation;Dry needling;Passive range of motion   PT Next Visit Plan look at new shoes, gait mechanics, IT band STM.  Discuss injection.     PT Home Exercise Plan see HEP sheet   Consulted and Agree with Plan of Care Patient      Patient will benefit from skilled therapeutic intervention in order to improve the following deficits and impairments:  Abnormal gait, Decreased activity tolerance, Decreased balance, Decreased endurance, Decreased mobility, Decreased range of motion, Difficulty walking, Decreased strength, Hypomobility, Impaired flexibility, Postural dysfunction, Improper body mechanics, Pain  Visit Diagnosis: Pain in right hip  Other abnormalities of gait and mobility  Joint stiffness of knee, right  Difficulty walking     Problem List There are no active problems to display for this patient.  [name], PT, DPT # [phone] 07/29/2016, 12:19 PM  [facility] [address] [place], [place], [address] Phone: [phone]   Fax:  [phone]  Name: Roy [name]. MRN: [id number] Date of Birth: 01-14-1946

## 2016-08-02 ENCOUNTER — Ambulatory Visit: Payer: Medicare HMO | Admitting: Physical Therapy

## 2016-08-02 DIAGNOSIS — M25551 Pain in right hip: Secondary | ICD-10-CM | POA: Diagnosis not present

## 2016-08-02 DIAGNOSIS — R2689 Other abnormalities of gait and mobility: Secondary | ICD-10-CM

## 2016-08-02 DIAGNOSIS — M25661 Stiffness of right knee, not elsewhere classified: Secondary | ICD-10-CM

## 2016-08-02 DIAGNOSIS — R262 Difficulty in walking, not elsewhere classified: Secondary | ICD-10-CM

## 2016-08-03 NOTE — Therapy (Signed)
[facility] [address] [place], [place], [address] Phone: [phone]   Fax:  [phone]  Physical Therapy Treatment  Patient Details  Name: Roy [name]. MRN: [id number] Date of Birth: Sep 23, 1945 Referring Provider: Riley Lam L. Wendall Mola, MD  Encounter Date: 08/02/2016      PT End of Session - 08/03/16 0724    Visit Number 7   Number of Visits 8   Date for PT Re-Evaluation 08/09/16   Authorization - Visit Number 7   Authorization - Number of Visits 8   PT Start Time [phone]   PT Stop Time 1026   PT Time Calculation (min) 49 min   Activity Tolerance Patient tolerated treatment well;Patient limited by pain   Behavior During Therapy [facility] for tasks assessed/performed      Past Medical History:  Diagnosis Date  . Diabetes mellitus without complication (HCC)   . Hypertension     Past Surgical History:  Procedure Laterality Date  . arm surgery Right    4x surgery as a child, cut arm falling through glass window   . HYDROCELE EXCISION / REPAIR    . REPLACEMENT TOTAL KNEE Right     There were no vitals filed for this visit.      Subjective Assessment - 08/03/16 0722    Subjective Pt. received cortisone injection in R hip with some initial benefit reported.     Pertinent History hx of hip pain, back pain,    Limitations Lifting;Standing;Walking;House hold activities   How long can you stand comfortably? immediate pain   How long can you walk comfortably? immediate pain   Diagnostic tests XRAYS prior to surgery    Patient Stated Goals decrease pain, return to walking protocol, be able to go to [place] at end of month with grandchildren   Currently in Pain? Yes   Pain Score 5    Pain Location Hip   Pain Orientation Right      Manual: Supine and L sidelying IT/ hip flexor/ hamstring/ hip IR/ER stretches (21 min.).    L Sidelying with pillow b/w legs and R side up: Deep tissue massage to R IT band, deep  pressure application, muscle tension techniques implemented with massage cream and proper draping of patient. STM to gluteal and distal quadricep region of RLE. Use of Biofreeze during STM to R ITB at end of tx. (issued sample for home use during [place] trip).       Pt. benefits from skilled physical therapy to decrease pain, improve functional mobility, and return to home ambulatory program         PT Long Term Goals - 07/12/16 1928      PT LONG TERM GOAL #1   Title Patient will improve LEFS score to >60/80 for improved quality of life.    Baseline 4/2 41/80   Time 4   Period Weeks   Status New     PT LONG TERM GOAL #2   Title Patient will ambulate 10 minutes with decreased R trendelenburg gait and pain levels to allow for greater community mobility and to return to walking program.    Baseline R trendelenburg gait, 8/10 pain   Time 4   Period Weeks   Status New     PT LONG TERM GOAL #3   Title Patient will report <3/10 pain while performing household activities to return to activities of daily life.    Baseline 8/10 pain with standing   Time 4  Period Weeks   Status New     PT LONG TERM GOAL #4   Title Patient will independent with HEP in order to improve R hip strength to 4+/5 for improved gait and muscle length.    Baseline R grossly 4-/5   Time 4   Period Weeks   Status New            Plan - 08/03/16 9604    Clinical Impression Statement (+) R hip tenderness at greater trochanter/ site of cortisone injection.  Minimal to no pain with STM proximal to distal on IT band.  Pt. ambulates with slight R antalgic gait pattern around PT clinic.  PT recommended use of ice or Biofreeze to R hip/ITB during trip to [place] to control pain/inflammation.  PT issued sample of Biofreeze.     Rehab Potential Good   Clinical Impairments Affecting Rehab Potential hx of knee surgery, hx of hip pain   PT Frequency 2x / week   PT Duration 4 weeks   PT Treatment/Interventions  ADLs/Self Care Home Management;Aquatic Therapy;Cryotherapy;Electrical Stimulation;DME Instruction;Fluidtherapy;Ultrasound;Traction;Moist Heat;Iontophoresis 4mg /ml Dexamethasone;Gait training;Stair training;Functional mobility training;Therapeutic activities;Therapeutic exercise;Balance training;Patient/family education;Neuromuscular re-education;Manual techniques;Compression bandaging;Taping;Energy conservation;Dry needling;Passive range of motion   PT Next Visit Plan Discuss trip to [place].  RECERT next tx. if R hip pain remains.     PT Home Exercise Plan see HEP sheet      Patient will benefit from skilled therapeutic intervention in order to improve the following deficits and impairments:  Abnormal gait, Decreased activity tolerance, Decreased balance, Decreased endurance, Decreased mobility, Decreased range of motion, Difficulty walking, Decreased strength, Hypomobility, Impaired flexibility, Postural dysfunction, Improper body mechanics, Pain  Visit Diagnosis: Pain in right hip  Other abnormalities of gait and mobility  Joint stiffness of knee, right  Difficulty walking     Problem List There are no active problems to display for this patient.  Roy [name], PT, DPT # [phone] 08/03/2016, 7:32 AM  [facility] [address] [place], [place], [address] Phone: [phone]   Fax:  [phone]  Name: Roy [name]. MRN: [id number] Date of Birth: 1945-05-25

## 2016-08-04 ENCOUNTER — Encounter: Payer: Medicare Other | Admitting: Physical Therapy

## 2016-08-12 ENCOUNTER — Ambulatory Visit: Payer: Medicare HMO | Attending: Sports Medicine | Admitting: Physical Therapy

## 2016-08-12 DIAGNOSIS — R262 Difficulty in walking, not elsewhere classified: Secondary | ICD-10-CM

## 2016-08-12 DIAGNOSIS — M25661 Stiffness of right knee, not elsewhere classified: Secondary | ICD-10-CM | POA: Insufficient documentation

## 2016-08-12 DIAGNOSIS — R2689 Other abnormalities of gait and mobility: Secondary | ICD-10-CM | POA: Diagnosis present

## 2016-08-12 DIAGNOSIS — M25551 Pain in right hip: Secondary | ICD-10-CM | POA: Diagnosis present

## 2016-08-13 NOTE — Therapy (Signed)
[facility] [address] [place], [place], [address] Phone: [phone]   Fax:  [phone]  Physical Therapy Treatment  Patient Details  Name: Roy [name]. MRN: [id number] Date of Birth: October 22, 1945 Referring Provider: Nathaneil Canary L. Olena Heckle, MD  Encounter Date: 08/12/2016      PT End of Session - 08/13/16 1416    Visit Number 8   Number of Visits 15   Date for PT Re-Evaluation 09/09/16   Authorization - Visit Number 8   Authorization - Number of Visits 17   PT Start Time 0937   PT Stop Time 1033   PT Time Calculation (min) 56 min   Activity Tolerance Patient tolerated treatment well;Patient limited by pain   Behavior During Therapy [facility] for tasks assessed/performed      Past Medical History:  Diagnosis Date  . Diabetes mellitus without complication ([place])   . Hypertension     Past Surgical History:  Procedure Laterality Date  . arm surgery Right    4x surgery as a child, cut arm falling through glass window   . HYDROCELE EXCISION / REPAIR    . REPLACEMENT TOTAL KNEE Right     There were no vitals filed for this visit.      Subjective Assessment - 08/13/16 1407    Subjective Pt. states R hip pain limited his mobility at [facility].  Pt. reports having a really difficult 2nd day (due to walking).     Pertinent History hx of hip pain, back pain,    Limitations Lifting;Standing;Walking;House hold activities   How long can you stand comfortably? immediate pain   How long can you walk comfortably? immediate pain   Diagnostic tests XRAYS prior to surgery    Patient Stated Goals return to walking with no increase R hip pain.    Currently in Pain? Yes   Pain Score 5    Pain Location Hip   Pain Orientation Right   Pain Descriptors / Indicators Aching      Manual: Supine and L sidelying IT/ hip flexor/ hamstring/ hip IR/ER stretches (16 min.).    L Sidelying with pillow b/w legs and R side up: Deep tissue massage to  R IT band, deep pressure application, muscle tension techniques implemented with massage cream and proper draping of patient. STM to gluteal and distal quadricepregion of RLE.  Estim:  IFC to R hip joint at 40% scan as tolerated output.  Ice to R hip with stim for pain mgmt. To improve mobility.     Pt. benefits from skilled physical therapy to decrease pain, improve functional mobility, and return to home ambulatory program        PT Education - 08/13/16 1416    Education provided Yes   Education Details TENS unit for pain mgmt.     Person(s) Educated Patient   Methods Explanation   Comprehension Verbalized understanding;Returned demonstration             PT Long Term Goals - 08/13/16 1437      PT LONG TERM GOAL #1   Title Patient will improve LEFS score to >60/80 for improved quality of life.    Baseline 4/2 41/80   Time 4   Period Weeks   Status On-going     PT LONG TERM GOAL #2   Title Patient will ambulate 10 minutes with decreased R trendelenburg gait and pain levels to allow for greater community mobility and to return to walking program.  Baseline R trendelenburg gait, 5/10 pain.  Increase R hip pain with prolonged walking.     Time 4   Period Weeks   Status Not Met     PT LONG TERM GOAL #3   Title Patient will report <3/10 pain while performing household activities to return to activities of daily life.    Baseline 5/10 R hip pain with standing.    Time 4   Period Weeks   Status Not Met     PT LONG TERM GOAL #4   Title Patient will independent with HEP in order to improve R hip strength to 4+/5 for improved gait and muscle length.    Baseline R grossly 4-/5   Time 4   Period Weeks   Status Not Met               Plan - 08/13/16 1417    Clinical Impression Statement Pt. has significant point tenderness at R hip greater trochanter.  Less pain along distal ITB today with STM.  Pt. limited with prolonged walking due to R hip pain.  Pt.  continues to have difficulty with turning over in bed/ sitting up towards R hip due to sharp pain.  R glut. med/ hip abd. muscle weakness remains.     Rehab Potential Good   Clinical Impairments Affecting Rehab Potential hx of knee surgery, hx of hip pain   PT Frequency 2x / week   PT Duration 4 weeks   PT Treatment/Interventions ADLs/Self Care Home Management;Aquatic Therapy;Cryotherapy;Electrical Stimulation;DME Instruction;Fluidtherapy;Ultrasound;Traction;Moist Heat;Iontophoresis 19m/ml Dexamethasone;Gait training;Stair training;Functional mobility training;Therapeutic activities;Therapeutic exercise;Balance training;Patient/family education;Neuromuscular re-education;Manual techniques;Compression bandaging;Taping;Energy conservation;Dry needling;Passive range of motion   PT Next Visit Plan Increase R hip mobility/ pain mgmt.    PT Home Exercise Plan see HEP sheet   Consulted and Agree with Plan of Care Patient      Patient will benefit from skilled therapeutic intervention in order to improve the following deficits and impairments:  Abnormal gait, Decreased activity tolerance, Decreased balance, Decreased endurance, Decreased mobility, Decreased range of motion, Difficulty walking, Decreased strength, Hypomobility, Impaired flexibility, Postural dysfunction, Improper body mechanics, Pain  Visit Diagnosis: Pain in right hip  Other abnormalities of gait and mobility  Joint stiffness of knee, right  Difficulty walking       G-Codes - 0May 23, 20181939    Functional Assessment Tool Used (Outpatient Only) LEFS, MMT, clinical judgement   Functional Limitation Mobility: Walking and moving around   Mobility: Walking and Moving Around Current Status [phone] At least 20 percent but less than 40 percent impaired, limited or restricted   Mobility: Walking and Moving Around Goal Status [phone] At least 1 percent but less than 20 percent impaired, limited or restricted      Problem List There  are no active problems to display for this patient.  MPura Spice PT, DPT # [phone]/07/2016, 2:40 PM   [facility] LLC157 Roberts StreetMRutherfordton N[place]aska [address]Phone: [phone]  Fax:  [phone] Name: Roy [name] MRN: [id number] of Birth: 916-Aug-1947

## 2016-08-18 ENCOUNTER — Ambulatory Visit: Payer: Medicare HMO | Admitting: Physical Therapy

## 2016-08-18 DIAGNOSIS — M25551 Pain in right hip: Secondary | ICD-10-CM | POA: Diagnosis not present

## 2016-08-18 DIAGNOSIS — R2689 Other abnormalities of gait and mobility: Secondary | ICD-10-CM

## 2016-08-18 DIAGNOSIS — R262 Difficulty in walking, not elsewhere classified: Secondary | ICD-10-CM

## 2016-08-18 DIAGNOSIS — M25661 Stiffness of right knee, not elsewhere classified: Secondary | ICD-10-CM

## 2016-08-19 NOTE — Therapy (Signed)
[name] [address] [place], [place], [address] Phone: [phone]   Fax:  [phone]  Physical Therapy Treatment  Patient Details  Name: Roy [name]. MRN: [id number] Date of Birth: 07-11-1945 Referring Provider: Nathaneil Canary L. Olena Heckle, MD  Encounter Date: 08/18/2016      PT End of Session - 08/19/16 1358    Visit Number 9   Number of Visits 15   Date for PT Re-Evaluation 09/09/16   Authorization - Visit Number 9   Authorization - Number of Visits 17   PT Start Time [phone]   PT Stop Time 1031   PT Time Calculation (min) 48 min   Activity Tolerance Patient tolerated treatment well;Patient limited by pain   Behavior During Therapy [facility] for tasks assessed/performed      Past Medical History:  Diagnosis Date  . Diabetes mellitus without complication ([place])   . Hypertension     Past Surgical History:  Procedure Laterality Date  . arm surgery Right    4x surgery as a child, cut arm falling through glass window   . HYDROCELE EXCISION / REPAIR    . REPLACEMENT TOTAL KNEE Right     There were no vitals filed for this visit.      Subjective Assessment - 08/19/16 1354    Subjective Pt. reports R hip tenderness remains persistent but able to get more done during the day.  Pt. state he had his best day yesterday since start of hip pain.     Pertinent History hx of hip pain, back pain,    Limitations Lifting;Standing;Walking;House hold activities   How long can you stand comfortably? immediate pain   How long can you walk comfortably? immediate pain   Diagnostic tests XRAYS prior to surgery    Patient Stated Goals return to walking with no increase R hip pain.    Currently in Pain? Yes   Pain Score 3    Pain Location Hip   Pain Orientation Right   Pain Descriptors / Indicators Aching      Manual: Supine and L sidelyingIT/hip flexor/hamstring/ hipIR/ERstretches (21 min.). L Sidelying with pillow b/w legs  and R side up: Deep tissue massage to R IT band, deep pressure application, muscle tension techniques implemented with massage cream and proper draping of patient. STM to gluteal and distal quadricepregion of RLE.  Estim:  IFC to R hip joint at 40% scan as tolerated output.  Ice to R hip with stim for pain mgmt. To improve mobility.    Pt.benefitsfrom skilled physical therapy to decrease pain, improve functional mobility, and return to home ambulatory program        PT Long Term Goals - 08/13/16 1437      PT LONG TERM GOAL #1   Title Patient will improve LEFS score to >60/80 for improved quality of life.    Baseline 4/2 41/80   Time 4   Period Weeks   Status On-going     PT LONG TERM GOAL #2   Title Patient will ambulate 10 minutes with decreased R trendelenburg gait and pain levels to allow for greater community mobility and to return to walking program.    Baseline R trendelenburg gait, 5/10 pain.  Increase R hip pain with prolonged walking.     Time 4   Period Weeks   Status Not Met     PT LONG TERM GOAL #3   Title Patient will report <3/10 pain while performing household activities  to return to activities of daily life.    Baseline 5/10 R hip pain with standing.    Time 4   Period Weeks   Status Not Met     PT LONG TERM GOAL #4   Title Patient will independent with HEP in order to improve R hip strength to 4+/5 for improved gait and muscle length.    Baseline R grossly 4-/5   Time 4   Period Weeks   Status Not Met            Plan - 08/19/16 1359    Clinical Impression Statement Significant R hip tenderness with palpation over R greater trochanter and difficulty with rolling over on blue mat table secondary to pain.  Pt. reports decrease R hip pain during IFC (landline tower) in L sidelying position with use of ice.  No tenderness in R mid-distal ITB region.     Rehab Potential Good   Clinical Impairments Affecting Rehab Potential hx of knee surgery, hx  of hip pain   PT Frequency 2x / week   PT Duration 4 weeks   PT Treatment/Interventions ADLs/Self Care Home Management;Aquatic Therapy;Cryotherapy;Electrical Stimulation;DME Instruction;Fluidtherapy;Ultrasound;Traction;Moist Heat;Iontophoresis 92m/ml Dexamethasone;Gait training;Stair training;Functional mobility training;Therapeutic activities;Therapeutic exercise;Balance training;Patient/family education;Neuromuscular re-education;Manual techniques;Compression bandaging;Taping;Energy conservation;Dry needling;Passive range of motion   PT Next Visit Plan Increase R hip mobility/ pain mgmt.    PT Home Exercise Plan see HEP sheet   Consulted and Agree with Plan of Care Patient      Patient will benefit from skilled therapeutic intervention in order to improve the following deficits and impairments:  Abnormal gait, Decreased activity tolerance, Decreased balance, Decreased endurance, Decreased mobility, Decreased range of motion, Difficulty walking, Decreased strength, Hypomobility, Impaired flexibility, Postural dysfunction, Improper body mechanics, Pain  Visit Diagnosis: Pain in right hip  Other abnormalities of gait and mobility  Joint stiffness of knee, right  Difficulty walking     Problem List There are no active problems to display for this patient.  MPura Spice PT, DPT # [phone]/01/2017, 2:14 PM  [place] [facility]MWabbase[address] [place] [address]Phone: [phone]  Fax:  [phone] Name: Roy [name] MRN: [id number] of Birth: 9December 19, 1947

## 2016-08-25 ENCOUNTER — Encounter: Payer: Self-pay | Admitting: Physical Therapy

## 2016-08-25 ENCOUNTER — Ambulatory Visit: Payer: Medicare HMO | Admitting: Physical Therapy

## 2016-08-25 DIAGNOSIS — M25551 Pain in right hip: Secondary | ICD-10-CM | POA: Diagnosis not present

## 2016-08-25 DIAGNOSIS — M25661 Stiffness of right knee, not elsewhere classified: Secondary | ICD-10-CM

## 2016-08-25 DIAGNOSIS — R262 Difficulty in walking, not elsewhere classified: Secondary | ICD-10-CM

## 2016-08-25 DIAGNOSIS — R2689 Other abnormalities of gait and mobility: Secondary | ICD-10-CM

## 2016-08-25 NOTE — Therapy (Signed)
[facility] [address] [place], [place], [address] Phone: [phone]   Fax:  [phone]  Physical Therapy Treatment  Patient Details  Name: Roy [name]. MRN: [id number] Date of Birth: June 16, 1945 Referring Provider: Nathaneil Canary L. Olena Heckle, MD  Encounter Date: 08/25/2016      PT End of Session - 08/25/16 1044    Visit Number 10   Number of Visits 15   Date for PT Re-Evaluation 09/09/16   Authorization - Visit Number 10   Authorization - Number of Visits 17   PT Start Time 2585   PT Stop Time 2778   PT Time Calculation (min) 51 min   Activity Tolerance Patient tolerated treatment well;Patient limited by pain   Behavior During Therapy [facility] for tasks assessed/performed      Past Medical History:  Diagnosis Date  . Diabetes mellitus without complication ([place])   . Hypertension     Past Surgical History:  Procedure Laterality Date  . arm surgery Right    4x surgery as a child, cut arm falling through glass window   . HYDROCELE EXCISION / REPAIR    . REPLACEMENT TOTAL KNEE Right     There were no vitals filed for this visit.      Subjective Assessment - 08/25/16 1043    Subjective Pt. reports he is doing better but still having R hip tenderness.  Pt. was able to drive to [facility] and watch softball game with no increase in R hip pain.     Pertinent History hx of hip pain, back pain,    Limitations Lifting;Standing;Walking;House hold activities   How long can you stand comfortably? immediate pain   How long can you walk comfortably? immediate pain   Diagnostic tests XRAYS prior to surgery    Patient Stated Goals return to walking with no increase R hip pain.    Currently in Pain? Yes   Pain Score 6    Pain Location Hip   Pain Orientation Right   Pain Descriptors / Indicators Aching;Tender   Pain Type Chronic pain      Objective:  There.ex.: Standing hip ex. (all planes)- hip flexion is pain provoking  in R hip. Supine TrA ex.: hip flexion/ SLR/ hip abd./ heel slides/ bolster bridging 10x2 each.  Reviewed HEP.  Manual tx.:  Supine/ L sidelying R hip stretches and STM.  R hip AP grade II-III mobs. 2x20 sec.  Ice to R hip in L sidelying position 10 min. After tx.       Pt response for medical necessity:  Benefits from stretches/ manual tx and progression to there.ex. Program to improve R hip pain/ mobility.         PT Long Term Goals - 08/13/16 1437      PT LONG TERM GOAL #1   Title Patient will improve LEFS score to >60/80 for improved quality of life.    Baseline 4/2 41/80   Time 4   Period Weeks   Status On-going     PT LONG TERM GOAL #2   Title Patient will ambulate 10 minutes with decreased R trendelenburg gait and pain levels to allow for greater community mobility and to return to walking program.    Baseline R trendelenburg gait, 5/10 pain.  Increase R hip pain with prolonged walking.     Time 4   Period Weeks   Status Not Met     PT LONG TERM GOAL #3   Title  Patient will report <3/10 pain while performing household activities to return to activities of daily life.    Baseline 5/10 R hip pain with standing.    Time 4   Period Weeks   Status Not Met     PT LONG TERM GOAL #4   Title Patient will independent with HEP in order to improve R hip strength to 4+/5 for improved gait and muscle length.    Baseline R grossly 4-/5   Time 4   Period Weeks   Status Not Met            Plan - 08/26/16 1754    Clinical Impression Statement Pt. walking with improved gait pattern but remains limited by R hip pain with increase activity/walking.  (+) R greater trochanter tenderness with palpation/ lying on R side.  No tenderness in R ITB noted today.  Pt. demonstrates good R hip abd./ flexion with less R hip pain.  Pt. continues to benefit from use of ice/ stretching but tenderness/pain remains.  Pt. is planning on following up with MD to discuss.     Rehab Potential Good    Clinical Impairments Affecting Rehab Potential hx of knee surgery, hx of hip pain   PT Frequency 2x / week   PT Duration 4 weeks   PT Treatment/Interventions ADLs/Self Care Home Management;Aquatic Therapy;Cryotherapy;Electrical Stimulation;DME Instruction;Fluidtherapy;Ultrasound;Traction;Moist Heat;Iontophoresis 29m/ml Dexamethasone;Gait training;Stair training;Functional mobility training;Therapeutic activities;Therapeutic exercise;Balance training;Patient/family education;Neuromuscular re-education;Manual techniques;Compression bandaging;Taping;Energy conservation;Dry needling;Passive range of motion   PT Next Visit Plan Increase R hip mobility/ pain mgmt.    PT Home Exercise Plan see HEP sheet   Consulted and Agree with Plan of Care Patient      Patient will benefit from skilled therapeutic intervention in order to improve the following deficits and impairments:  Abnormal gait, Decreased activity tolerance, Decreased balance, Decreased endurance, Decreased mobility, Decreased range of motion, Difficulty walking, Decreased strength, Hypomobility, Impaired flexibility, Postural dysfunction, Improper body mechanics, Pain  Visit Diagnosis: Pain in right hip  Other abnormalities of gait and mobility  Joint stiffness of knee, right  Difficulty walking     Problem List There are no active problems to display for this patient.  MPura Spice PT, DPT # [phone]/17/2018, 6:00 PM  [facility] [address]Phone: [phone]  Fax:  [phone] Name: [name] MRN: [id number] of Birth: 905-05-1945

## 2016-09-01 ENCOUNTER — Ambulatory Visit: Payer: Medicare HMO | Admitting: Physical Therapy

## 2016-09-01 DIAGNOSIS — R2689 Other abnormalities of gait and mobility: Secondary | ICD-10-CM

## 2016-09-01 DIAGNOSIS — M25551 Pain in right hip: Secondary | ICD-10-CM | POA: Diagnosis not present

## 2016-09-01 DIAGNOSIS — M25661 Stiffness of right knee, not elsewhere classified: Secondary | ICD-10-CM

## 2016-09-01 DIAGNOSIS — R262 Difficulty in walking, not elsewhere classified: Secondary | ICD-10-CM

## 2016-09-02 ENCOUNTER — Encounter: Payer: Self-pay | Admitting: Physical Therapy

## 2016-09-02 NOTE — Therapy (Addendum)
[facility] [address] [place], [place], [address] Phone: [phone]   Fax:  [phone]  Physical Therapy Treatment  Patient Details  Name: Roy [name]. MRN: [id number] Date of Birth: 10-08-1945 Referring Provider: Nathaneil Canary L. Olena Heckle, MD  Encounter Date: 09/01/2016      PT End of Session - 09/02/16 1415    Visit Number 11   Number of Visits 15   Date for PT Re-Evaluation 09/09/16   Authorization - Visit Number 11   Authorization - Number of Visits 17   PT Start Time 0936   PT Stop Time 1027   PT Time Calculation (min) 51 min   Activity Tolerance Patient tolerated treatment well;Patient limited by pain   Behavior During Therapy [facility] for tasks assessed/performed      Past Medical History:  Diagnosis Date  . Diabetes mellitus without complication ([place])   . Hypertension     Past Surgical History:  Procedure Laterality Date  . arm surgery Right    4x surgery as a child, cut arm falling through glass window   . HYDROCELE EXCISION / REPAIR    . REPLACEMENT TOTAL KNEE Right     There were no vitals filed for this visit.      Subjective Assessment - 09/02/16 1413    Subjective Pt. reports having 2 really good days (Monday and Tuesday) but woke up this morning with significant R hip pain.  Pt. reports difficulty getting out of bed and walking secondary to severity of pain.     Pertinent History hx of hip pain, back pain,    Limitations Lifting;Standing;Walking;House hold activities   How long can you stand comfortably? immediate pain   How long can you walk comfortably? immediate pain   Diagnostic tests XRAYS prior to surgery    Patient Stated Goals return to walking with no increase R hip pain.    Currently in Pain? Yes   Pain Score 6    Pain Location Hip   Pain Orientation Right   Pain Descriptors / Indicators Aching;Tender   Pain Type Chronic pain        Objective:  There.ex.: Standing hip ex.  (all planes)- No increase c/o pain.  Supine TrA ex.: hip flexion/ SLR/ hip abd./ heel slides/ bolster bridging 10x2 each.  Reviewed HEP.  Manual tx.:  Supine/ L sidelying R hip stretches and STM.  R hip AP grade II-III mobs. 2x20 sec.  Ice to R hip in L sidelying position 10 min. After tx.                 Pt response for medical necessity:  Benefits from stretches/ manual tx and progression to there.ex. Program to improve R hip pain/ mobility.         (+) R hip greater trochanter but less tender than weeks past.  Pt. ambulates with improved gait pattern and no ITB tenderness.  Pt. will continue with HEP on a more independent basis and return to PT in 2 weeks.  Pt. instructed to contact PT if symptoms worsen before then.        PT Long Term Goals - 08/13/16 1437      PT LONG TERM GOAL #1   Title Patient will improve LEFS score to >60/80 for improved quality of life.    Baseline 4/2 41/80   Time 4   Period Weeks   Status On-going     PT LONG TERM GOAL #2  Title Patient will ambulate 10 minutes with decreased R trendelenburg gait and pain levels to allow for greater community mobility and to return to walking program.    Baseline R trendelenburg gait, 5/10 pain.  Increase R hip pain with prolonged walking.     Time 4   Period Weeks   Status Not Met     PT LONG TERM GOAL #3   Title Patient will report <3/10 pain while performing household activities to return to activities of daily life.    Baseline 5/10 R hip pain with standing.    Time 4   Period Weeks   Status Not Met     PT LONG TERM GOAL #4   Title Patient will independent with HEP in order to improve R hip strength to 4+/5 for improved gait and muscle length.    Baseline R grossly 4-/5   Time 4   Period Weeks   Status Not Met               Plan - 09/02/16 2051    Rehab Potential Good   Clinical Impairments Affecting Rehab Potential hx of knee surgery, hx of hip pain   PT Frequency 2x / week   PT Duration 4  weeks   PT Treatment/Interventions ADLs/Self Care Home Management;Aquatic Therapy;Cryotherapy;Electrical Stimulation;DME Instruction;Fluidtherapy;Ultrasound;Traction;Moist Heat;Iontophoresis 67m/ml Dexamethasone;Gait training;Stair training;Functional mobility training;Therapeutic activities;Therapeutic exercise;Balance training;Patient/family education;Neuromuscular re-education;Manual techniques;Compression bandaging;Taping;Energy conservation;Dry needling;Passive range of motion   PT Next Visit Plan Increase R hip mobility/ pain mgmt.  Pt. will call PT next week if pain persists/worsens.     PT Home Exercise Plan see HEP sheet      Patient will benefit from skilled therapeutic intervention in order to improve the following deficits and impairments:  Abnormal gait, Decreased activity tolerance, Decreased balance, Decreased endurance, Decreased mobility, Decreased range of motion, Difficulty walking, Decreased strength, Hypomobility, Impaired flexibility, Postural dysfunction, Improper body mechanics, Pain  Visit Diagnosis: Pain in right hip  Other abnormalities of gait and mobility  Joint stiffness of knee, right  Difficulty walking     Problem List There are no active problems to display for this patient.  MPura Spice PT, DPT # [phone]/24/2018, 8:53 PM   [facility] CenterMSt[facility] Center[address]MSprague [place] [address]Phone: [phone]  Fax:  [phone] Name: Roy [name] MRN: [id number] of Birth: [birth year]

## 2016-09-15 ENCOUNTER — Encounter: Payer: Self-pay | Admitting: Physical Therapy

## 2016-09-15 ENCOUNTER — Ambulatory Visit: Payer: Medicare HMO | Attending: Sports Medicine | Admitting: Physical Therapy

## 2016-09-15 DIAGNOSIS — M25551 Pain in right hip: Secondary | ICD-10-CM

## 2016-09-15 DIAGNOSIS — R262 Difficulty in walking, not elsewhere classified: Secondary | ICD-10-CM | POA: Diagnosis present

## 2016-09-15 DIAGNOSIS — R2689 Other abnormalities of gait and mobility: Secondary | ICD-10-CM | POA: Insufficient documentation

## 2016-09-15 DIAGNOSIS — M25661 Stiffness of right knee, not elsewhere classified: Secondary | ICD-10-CM | POA: Diagnosis present

## 2016-09-16 NOTE — Therapy (Signed)
[facility] [address] [place], [place], [address] Phone: [phone]   Fax:  [phone]  Physical Therapy Treatment  Patient Details  Name: Roy [name]. MRN: [id number] Date of Birth: 31-Jan-1946 Referring Provider: Nathaneil Canary L. Olena Heckle, MD  Encounter Date: 09/15/2016      PT End of Session - 09/16/16 0750    Visit Number 12   Number of Visits 15   Date for PT Re-Evaluation 09/15/16   Authorization - Visit Number 12   Authorization - Number of Visits 17   PT Start Time 6270   PT Stop Time 1012   PT Time Calculation (min) 28 min   Activity Tolerance Patient tolerated treatment well   Behavior During Therapy [facility] for tasks assessed/performed      Past Medical History:  Diagnosis Date  . Diabetes mellitus without complication ([place])   . Hypertension     Past Surgical History:  Procedure Laterality Date  . arm surgery Right    4x surgery as a child, cut arm falling through glass window   . HYDROCELE EXCISION / REPAIR    . REPLACEMENT TOTAL KNEE Right     There were no vitals filed for this visit.    Objective:  Manual tx.: Supine/ L sidelying R hip stretches and STM. R hip AP grade II-III mobs. 2x20 sec. No tenderness/ reviewed HEP and stretches for self-management.   Pt response for medical necessity: Benefits from stretches/ manual tx and progression to there.ex. Program to improve R hip pain/ mobility. Probable discharge with HEP.         PT Long Term Goals - 09/16/16 1813      PT LONG TERM GOAL #1   Title Patient will improve LEFS score to >60/80 for improved quality of life.    Baseline 50 out of 80 on 6/6   Time 4   Period Weeks   Status Partially Met     PT LONG TERM GOAL #2   Title Patient will ambulate 10 minutes with decreased R trendelenburg gait and pain levels to allow for greater community mobility and to return to walking program.    Baseline minimal c/o R hip  pain with prolonged walking.    Time 4   Period Weeks   Status Not Met     PT LONG TERM GOAL #3   Title Patient will report <3/10 pain while performing household activities to return to activities of daily life.    Baseline <3/10 R hip pain.    Time 4   Period Weeks   Status Achieved     PT LONG TERM GOAL #4   Title Patient will independent with HEP in order to improve R hip strength to 4+/5 for improved gait and muscle length.    Baseline R grossly 4+/5   Time 4   Period Weeks   Status Achieved               Plan - 09/16/16 1810    Clinical Impression Statement Pt. has done well over past week with only a few c/o R hip pain/tenderness.  Pt. reports good compliance with HEP/stretches and presents with marked improvement in flexibility in R hip as compared to L.   No tenderness over R hip/greater trochanter region with deep palpation.  Pt. will be on hold with PT and instructed to contact if any issues/ pain over next several weeks.     Clinical Presentation Stable   Rehab  Potential Good   Clinical Impairments Affecting Rehab Potential hx of knee surgery, hx of hip pain   PT Frequency 2x / week   PT Duration 4 weeks   PT Treatment/Interventions ADLs/Self Care Home Management;Aquatic Therapy;Cryotherapy;Electrical Stimulation;DME Instruction;Fluidtherapy;Ultrasound;Traction;Moist Heat;Iontophoresis 44m/ml Dexamethasone;Gait training;Stair training;Functional mobility training;Therapeutic activities;Therapeutic exercise;Balance training;Patient/family education;Neuromuscular re-education;Manual techniques;Compression bandaging;Taping;Energy conservation;Dry needling;Passive range of motion   PT Next Visit Plan Probable discharge with HEP focus.     PT Home Exercise Plan see HEP sheet   Consulted and Agree with Plan of Care Patient      Patient will benefit from skilled therapeutic intervention in order to improve the following deficits and impairments:  Abnormal gait, Decreased  activity tolerance, Decreased balance, Decreased endurance, Decreased mobility, Decreased range of motion, Difficulty walking, Decreased strength, Hypomobility, Impaired flexibility, Postural dysfunction, Improper body mechanics, Pain  Visit Diagnosis: Pain in right hip  Other abnormalities of gait and mobility  Joint stiffness of knee, right  Difficulty walking       G-Codes - 0Jun 27, [id number]    Functional Assessment Tool Used (Outpatient Only) LEFS, MMT, clinical judgement   Functional Limitation Mobility: Walking and moving around   Mobility: Walking and Moving Around Current Status [phone] At least 1 percent but less than 20 percent impaired, limited or restricted   Mobility: Walking and Moving Around Goal Status [phone] At least 1 percent but less than 20 percent impaired, limited or restricted   Mobility: Walking and Moving Around Discharge Status [phone] At least 1 percent but less than 20 percent impaired, limited or restricted      Problem List There are no active problems to display for this patient.  MPura Spice PT, DPT # [phone]/10/2016, 6:17 PM  [facility] [facility] [address]Phone: [phone]  Fax:  [phone] Name: Roy [name] MRN: [id number] of Birth: 9Feb 18, 1947

## 2019-03-16 ENCOUNTER — Ambulatory Visit: Payer: Medicare HMO | Attending: Neurology | Admitting: Physical Therapy

## 2019-03-16 ENCOUNTER — Other Ambulatory Visit: Payer: Self-pay

## 2019-03-16 ENCOUNTER — Encounter: Payer: Self-pay | Admitting: Physical Therapy

## 2019-03-16 DIAGNOSIS — R270 Ataxia, unspecified: Secondary | ICD-10-CM | POA: Insufficient documentation

## 2019-03-16 DIAGNOSIS — M6281 Muscle weakness (generalized): Secondary | ICD-10-CM | POA: Diagnosis present

## 2019-03-16 DIAGNOSIS — R269 Unspecified abnormalities of gait and mobility: Secondary | ICD-10-CM | POA: Insufficient documentation

## 2019-03-16 NOTE — Therapy (Addendum)
[facility] [address] [place], [place], [address] Phone: [phone]   Fax:  [phone]  Physical Therapy Evaluation  Patient Details  Name: Roy [name]. MRN: [id number] Date of Birth: May 10, 1945 Referring Provider (PT): Dr. Theora Master   Encounter Date: 03/16/2019  PT End of Session - 03/16/19 0737    Visit Number  1    Number of Visits  8    Date for PT Re-Evaluation  04/13/19    Authorization - Visit Number  1    Authorization - Number of Visits  10    PT Start Time  0724    PT Stop Time  0840    PT Time Calculation (min)  76 min    Equipment Utilized During Treatment  Gait belt    Activity Tolerance  Patient tolerated treatment well    Behavior During Therapy  WFL for tasks assessed/performed       Past Medical History:  Diagnosis Date  . Diabetes mellitus without complication (HCC)   . Hypertension     Past Surgical History:  Procedure Laterality Date  . arm surgery Right    4x surgery as a child, cut arm falling through glass window   . HYDROCELE EXCISION / REPAIR    . REPLACEMENT TOTAL KNEE Right     There were no vitals filed for this visit.   Subjective Assessment - 03/16/19 0726    Subjective  Pt. reports he went to [place] due to increase c/o dizziness and balance issues.  Pt. states R knee feels like it wants to buckle.  Pt. entered PT with use of RW today for safety.  Pt. states he just started to use the RW over the past week.  Pt. know well to PT clinic for other MSK issues.    Pertinent History  Pt. known to PT clinic.  L shoulder/ RTC issues (needs stability program).  L knee bone on bone.  H/o R TKA (recent c/o R knee buckling/ fell yesterday).    Limitations  Lifting;Standing;Walking;House hold activities    Diagnostic tests  XRAYS    Patient Stated Goals  Improve balance/ gait independence    Currently in Pain?  No/denies       B LE strength grossly 5/5 MMT (increase R hamstring pain  with resistance) L knee buckling Increase LE muscle endurance reported over quarantine.  UE assist with tandem/ SLS/ walking down ramps/ stairs.     There.ex.:  See HEP      PT Education - 03/16/19 1650    Education provided  Yes    Education Details  See HEP    Person(s) Educated  Patient    Methods  Explanation;Demonstration;Handout    Comprehension  Verbalized understanding;Returned demonstration          PT Long Term Goals - 03/17/19 1452      PT LONG TERM GOAL #1   Title  Pt. will increase Berg balance test to >44/56 to improve safelty/ decrease fall risk.    Baseline  Initial Berg: 35/56 (high fall risk)    Time  4    Period  Weeks    Status  New    Target Date  04/13/19      PT LONG TERM GOAL #2   Title  Pt. will report no R hamstring tenderness/ pain with standing and daily functional tasks.    Baseline  (+) R hamstring tenderness.    Time  4  Period  Weeks    Status  New    Target Date  04/13/19      PT LONG TERM GOAL #3   Title  Pt. will ambulate 10 minutes with appropriate assistive device/ gait pattern on level surfaces safely.    Baseline  Antalgic gait pattern with use of RW.  Pt. fearful of L knee buckling.    Time  4    Period  Weeks    Status  New    Target Date  04/13/19      PT LONG TERM GOAL #4   Title  Pt. able to stand from recliner/ chair at home with 1 UE assist and maintain balance with no LOB to improve safety.    Baseline  Heavy UE assist required to stand.  Pt. reaches for RW or wall to steady balance.    Time  4    Period  Weeks    Status  New    Target Date  04/13/19          Plan - 03/17/19 1422    Clinical Impression Statement  Pt. is a pleasant 73 y/o male with c/o difficulty walking and L knee buckling/ mid-hamstring pain.  Pt. reports no pain currently at rest but marked increase in R proximal to mid-hamstring pain with palpation.  Pt. reports falling yesterday with no injury reported.  Pt. is currently not  exercising and remains sedentary.  Pt. presents with functional UE/LE AROM but limited knee AROM (L/R): flexion 111/107 deg. and extension -10/-2 deg..  Pt. has marked increase in R hamstring pain/ tenderness with proximal and distal hamstring stretches.  Pt. ambulates with R antalgic gait pattern with heavy use of B UE.  Pt. requires UE assist to stand from standard chair and maintain balance.  Berg balance test: 35/56 (high fall risk).  Pt. very unsteady and benefits from use of RW for safety with standing/ walking.  B LE muscle strength grossly 5/5 MMT with increase discomfort during R hamstring MMT.  Pt. will benefit from skilled PT services to increase LE strength/endurance and decrease R hamstring pain to improve safety/ independence with gait.    Stability/Clinical Decision Making  Evolving/Moderate complexity    Clinical Decision Making  Moderate    Rehab Potential  Good    Clinical Impairments Affecting Rehab Potential  hx of knee surgery, hx of hip pain    PT Frequency  2x / week    PT Duration  4 weeks    PT Treatment/Interventions  ADLs/Self Care Home Management;Aquatic Therapy;Cryotherapy;Electrical Stimulation;DME Instruction;Fluidtherapy;Ultrasound;Traction;Moist Heat;Iontophoresis 4mg /ml Dexamethasone;Gait training;Stair training;Functional mobility training;Therapeutic activities;Therapeutic exercise;Balance training;Patient/family education;Neuromuscular re-education;Manual techniques;Compression bandaging;Taping;Energy conservation;Dry needling;Passive range of motion    PT Next Visit Plan  Progress HEP/ dynamic balance tasks.   Reassess R hamstring pain    PT Home Exercise Plan  Access Code: [email or web address]    Consulted and Agree with Plan of Care  Patient       Patient will benefit from skilled therapeutic intervention in order to improve the following deficits and impairments:  Abnormal gait, Decreased activity tolerance, Decreased balance, Decreased endurance, Decreased mobility,  Decreased range of motion, Difficulty walking, Decreased strength, Hypomobility, Impaired flexibility, Postural dysfunction, Improper body mechanics, Pain  Visit Diagnosis: Ataxia  Gait difficulty  Muscle weakness (generalized)     Problem List There are no active problems to display for this patient.  [name], PT, DPT # [phone] 03/17/2019, 3:00 PM  [facility]  [address] [place], [place], [address] Phone: [phone]   Fax:  [phone]  Name: Roy [name]. MRN: [id number] Date of Birth: 01/10/1946

## 2019-03-16 NOTE — Patient Instructions (Signed)
Access Code: [email or web address]  URL: [email or web address]  Date: 03/16/2019  Prepared by: Dorcas Carrow   Exercises  Standing Hamstring Stretch with Step - 3 reps - 1 sets - 20 seconds hold - 2x daily - 7x weekly  Seated Hamstring Stretch - 3 reps - 1 sets - 20 seconds hold - 2x daily - 7x weekly  Sit to Stand with Armchair - 10 reps - 2 sets - 2x daily - 7x weekly

## 2019-03-17 NOTE — Addendum Note (Signed)
Addended by: Pura Spice on: 03/17/2019 03:04 PM   Modules accepted: Orders

## 2019-03-20 ENCOUNTER — Encounter: Payer: Self-pay | Admitting: Physical Therapy

## 2019-03-20 ENCOUNTER — Ambulatory Visit: Payer: Medicare HMO | Admitting: Physical Therapy

## 2019-03-20 ENCOUNTER — Other Ambulatory Visit: Payer: Self-pay

## 2019-03-20 DIAGNOSIS — R269 Unspecified abnormalities of gait and mobility: Secondary | ICD-10-CM

## 2019-03-20 DIAGNOSIS — M6281 Muscle weakness (generalized): Secondary | ICD-10-CM

## 2019-03-20 DIAGNOSIS — R270 Ataxia, unspecified: Secondary | ICD-10-CM

## 2019-03-20 NOTE — Therapy (Signed)
[facility] [address] [place], [place], [address] Phone: [phone]   Fax:  [phone]  Physical Therapy Treatment  Patient Details  Name: Roy [name]. MRN: [id number] Date of Birth: [birth year] Referring Provider (PT): Dr. Gurney Maxin   Encounter Date: 03/20/2019  Treatment: 2 of 8.  Recert date: 04/21/5091 2671 to 64   Past Medical History:  Diagnosis Date  . Diabetes mellitus without complication ([place])   . Hypertension     Past Surgical History:  Procedure Laterality Date  . arm surgery Right    4x surgery as a child, cut arm falling through glass window   . HYDROCELE EXCISION / REPAIR    . REPLACEMENT TOTAL KNEE Right     There were no vitals filed for this visit.     Pt. entered PT with use of RW today. Pt. states hamstring pain in R leg is still present. Pt. reports almost falling Friday night at [facility] but RW prevented.      There.ex.:  Reviewed HEP (modified standing hamstring).  See new HEP (sh. AAROM) Supine hamstring/ hip stretches (generalized) 3x each. Standing hip ex. (no wt.): 20x each flexion/ extension/ abduction No Nustep  Neuro.mm.:  Walking in clinic/ outside with use of RW and correction of posture/ head position Cuing to increase hip flexion/ step pattern/ heel strike  Walking cone taps in //-bars with 1 UE assist 3x (mirror feedback/ cuing)  Tandem/ SLS in //-bars (pt. Fearful of knee buckling if not fully extended)       PT Long Term Goals - 03/17/19 1452      PT LONG TERM GOAL #1   Title  Pt. will increase Berg balance test to >44/56 to improve safelty/ decrease fall risk.    Baseline  Initial Berg: 35/56 (high fall risk)    Time  4    Period  Weeks    Status  New    Target Date  04/13/19      PT LONG TERM GOAL #2   Title  Pt. will report no R hamstring tenderness/ pain with standing and daily functional tasks.    Baseline  (+) R hamstring  tenderness.    Time  4    Period  Weeks    Status  New    Target Date  04/13/19      PT LONG TERM GOAL #3   Title  Pt. will ambulate 10 minutes with appropriate assistive device/ gait pattern on level surfaces safely.    Baseline  Antalgic gait pattern with use of RW.  Pt. fearful of L knee buckling.    Time  4    Period  Weeks    Status  New    Target Date  04/13/19      PT LONG TERM GOAL #4   Title  Pt. able to stand from recliner/ chair at home with 1 UE assist and maintain balance with no LOB to improve safety.    Baseline  Heavy UE assist required to stand.  Pt. reaches for RW or wall to steady balance.    Time  4    Period  Weeks    Status  New    Target Date  04/13/19        Pt. benefits from use of RW with all aspects of ambulation on indoor/outdoor surfaces. Pt. still has c/o R mid-hamstring tenderness/ discomfort with deep palpation. Only 1 twinge of hamstring discomfort during supine  based. ex. PT issued a shoulder AAROM ex. to increase L shoulder ROM to assist with transfers/ safety during sitting. No Nustep or bike due to previous c/o knee pain with use of machine.       Patient will benefit from skilled therapeutic intervention in order to improve the following deficits and impairments:  Abnormal gait, Decreased activity tolerance, Decreased balance, Decreased endurance, Decreased mobility, Decreased range of motion, Difficulty walking, Decreased strength, Hypomobility, Impaired flexibility, Postural dysfunction, Improper body mechanics, Pain  Visit Diagnosis: Ataxia  Gait difficulty  Muscle weakness (generalized)     Problem List There are no problems to display for this patient.  [name], PT, DPT # [phone] 03/23/2019, 5:54 PM  [facility] [address] [place], [place], [address] Phone: [phone]   Fax:  [phone]  Name: [name] MRN: [id number] Date of Birth: 12-14-1945

## 2019-03-20 NOTE — Patient Instructions (Signed)
Access Code: [email or web address]  URL: [email or web address]  Date: 03/20/2019  Prepared by: Dorcas Carrow   Exercises  Seated Hamstring Stretch - 3 reps - 1 sets - 20 seconds hold - 2x daily - 7x weekly  Sit to Stand with Armchair - 10 reps - 2 sets - 2x daily - 7x weekly  Supine Shoulder Flexion Extension AAROM with Dowel - 20 reps - 1 sets - 1x daily - 7x weekly  Supine Shoulder Press with Dowel - 20 reps - 1 sets - 1x daily - 7x weekly  Supine Shoulder Abduction AAROM with Dowel - 20 reps - 1 sets - 1x daily - 7x weekly

## 2019-03-27 ENCOUNTER — Ambulatory Visit: Payer: Medicare HMO | Admitting: Physical Therapy

## 2019-03-27 ENCOUNTER — Encounter: Payer: Self-pay | Admitting: Physical Therapy

## 2019-03-27 ENCOUNTER — Other Ambulatory Visit: Payer: Self-pay

## 2019-03-27 DIAGNOSIS — R269 Unspecified abnormalities of gait and mobility: Secondary | ICD-10-CM

## 2019-03-27 DIAGNOSIS — R270 Ataxia, unspecified: Secondary | ICD-10-CM | POA: Diagnosis not present

## 2019-03-27 DIAGNOSIS — M6281 Muscle weakness (generalized): Secondary | ICD-10-CM

## 2019-03-27 NOTE — Therapy (Signed)
[facility] [address]. [place], [place], [address] Phone: [phone]   Fax:  [phone]  Physical Therapy Treatment  Patient Details  Name: Roy [name]. MRN: [id number] Date of Birth: [birth year] Referring Provider (PT): Dr. Gurney Maxin   Encounter Date: 03/27/2019  PT End of Session - 03/27/19 0727    Visit Number  3    Number of Visits  8    Date for PT Re-Evaluation  04/13/19    Authorization - Visit Number  3    Authorization - Number of Visits  10    PT Start Time  0723    PT Stop Time  0815    PT Time Calculation (min)  52 min    Equipment Utilized During Treatment  Gait belt    Activity Tolerance  Patient tolerated treatment well    Behavior During Therapy  WFL for tasks assessed/performed       Past Medical History:  Diagnosis Date  . Diabetes mellitus without complication ([place])   . Hypertension     Past Surgical History:  Procedure Laterality Date  . arm surgery Right    4x surgery as a child, cut arm falling through glass window   . HYDROCELE EXCISION / REPAIR    . REPLACEMENT TOTAL KNEE Right     There were no vitals filed for this visit.  Subjective Assessment - 03/27/19 0724    Subjective  Pt. states he is doing okay this morning.  Pt. had to eat a candy bar to increase blood sugar this morning.  Pt. entered PT with use of RW and reports 4/10 R hip pain.    Pertinent History  Pt. known to PT clinic.  L shoulder/ RTC issues (needs stability program).  L knee bone on bone.  H/o R TKA (recent c/o R knee buckling/ fell yesterday).    Limitations  Lifting;Standing;Walking;House hold activities    Diagnostic tests  XRAYS    Patient Stated Goals  Improve balance/ gait independence    Currently in Pain?  Yes    Pain Score  4     Pain Location  Hip    Pain Orientation  Right        Slight increase in R hip/hamstring discomfort while going from supine to sitting.        There.ex.:  Reviewed HEP ( discussed sh. AAROM/ ex.)  Standing hip ex. (4# wt.): marching/ heel raises/ hip abduction/ knee flexion 20x each.  Standing step touches (4#)- 6" step with heel and toe strike.    Static standing with 3# bicep curls/ punches (no wt. with L punches).    Supine R/LE stretches (hamstring/ piriformis/ hip stretches).  R proximal hamstring tenderness noted with deep palpation.     Neuro.mm.:  Walking in //-bars (forward/lateral/backwards) with 1 UE assist 3x (mirror feedback/ cuing)  Walking in clinic/ //-bars with use of SPC and 2-point gait pattern.    Amb. outside with use of RW/ [facility] and correction of posture/ head position Cuing to increase hip flexion/ step pattern/ heel strike.  PT assessing use of SPC for home use (short distance walking).   Turning CW/CCW with light 1 UE assist on //-bars.      PT Long Term Goals - 03/17/19 1452      PT LONG TERM GOAL #1   Title  Pt. will increase Berg balance test to >44/56 to improve safelty/ decrease fall risk.    Baseline  Initial  Berg: 35/56 (high fall risk)    Time  4    Period  Weeks    Status  New    Target Date  04/13/19      PT LONG TERM GOAL #2   Title  Pt. will report no R hamstring tenderness/ pain with standing and daily functional tasks.    Baseline  (+) R hamstring tenderness.    Time  4    Period  Weeks    Status  New    Target Date  04/13/19      PT LONG TERM GOAL #3   Title  Pt. will ambulate 10 minutes with appropriate assistive device/ gait pattern on level surfaces safely.    Baseline  Antalgic gait pattern with use of RW.  Pt. fearful of L knee buckling.    Time  4    Period  Weeks    Status  New    Target Date  04/13/19      PT LONG TERM GOAL #4   Title  Pt. able to stand from recliner/ chair at home with 1 UE assist and maintain balance with no LOB to improve safety.    Baseline  Heavy UE assist required to stand.  Pt. reaches for RW or wall to steady balance.     Time  4    Period  Weeks    Status  New    Target Date  04/13/19            Plan - 03/27/19 0727    Clinical Impression Statement  Pt. demonstrates ability to ambulate short distances with [facility] but PT recommends use of RW due to uncertainty of L knee/ R hamstring with intial steps after standing.  Pt. had no LOB during standing/ balance tasks at //-bars.  R hamstring discomfort noted with deep palpation but pt. able to complete standing ther.ex./ resisted ex. with no increase c/o pain.  PT encouraged pt. to continue with shoulder ROM ex. and will issue more resisted ther.ex. next tx.    Stability/Clinical Decision Making  Evolving/Moderate complexity    Clinical Decision Making  Moderate    Rehab Potential  Good    Clinical Impairments Affecting Rehab Potential  hx of knee surgery, hx of hip pain    PT Frequency  2x / week    PT Duration  4 weeks    PT Treatment/Interventions  ADLs/Self Care Home Management;Aquatic Therapy;Cryotherapy;Electrical Stimulation;DME Instruction;Fluidtherapy;Ultrasound;Traction;Moist Heat;Iontophoresis 4mg /ml Dexamethasone;Gait training;Stair training;Functional mobility training;Therapeutic activities;Therapeutic exercise;Balance training;Patient/family education;Neuromuscular re-education;Manual techniques;Compression bandaging;Taping;Energy conservation;Dry needling;Passive range of motion    PT Next Visit Plan  Progress HEP/ dynamic balance tasks.  Issue UE ex. program.    PT Home Exercise Plan  Access Code: [email or web address]    Consulted and Agree with Plan of Care  Patient       Patient will benefit from skilled therapeutic intervention in order to improve the following deficits and impairments:  Abnormal gait, Decreased activity tolerance, Decreased balance, Decreased endurance, Decreased mobility, Decreased range of motion, Difficulty walking, Decreased strength, Hypomobility, Impaired flexibility, Postural dysfunction, Improper body mechanics, Pain  Visit  Diagnosis: Ataxia  Gait difficulty  Muscle weakness (generalized)     Problem List There are no problems to display for this patient.  [name], PT, DPT # [phone] 03/27/2019, 12:28 PM  [facility] [address] [place], [place], [address] Phone: [phone]   Fax:  [phone]  Name: Roy [name]. MRN: [id number]  Date of Birth: 26-Oct-1945

## 2019-03-29 ENCOUNTER — Ambulatory Visit: Payer: Medicare HMO | Admitting: Physical Therapy

## 2019-03-29 ENCOUNTER — Encounter: Payer: Self-pay | Admitting: Physical Therapy

## 2019-03-29 ENCOUNTER — Other Ambulatory Visit: Payer: Self-pay

## 2019-03-29 DIAGNOSIS — M6281 Muscle weakness (generalized): Secondary | ICD-10-CM

## 2019-03-29 DIAGNOSIS — R270 Ataxia, unspecified: Secondary | ICD-10-CM | POA: Diagnosis not present

## 2019-03-29 DIAGNOSIS — R269 Unspecified abnormalities of gait and mobility: Secondary | ICD-10-CM

## 2019-03-29 NOTE — Therapy (Signed)
[facility] [address] [place], [place], [address] Phone: [phone]   Fax:  [phone]  Physical Therapy Treatment  Patient Details  Name: Roy [name]. MRN: [id number] Date of Birth: Jan 03, 1946 Referring Provider (PT): Dr. Theora Master   Encounter Date: 03/29/2019  PT End of Session - 03/29/19 0720    Visit Number  4    Number of Visits  8    Date for PT Re-Evaluation  04/13/19    Authorization - Visit Number  4    Authorization - Number of Visits  10    PT Start Time  0720    PT Stop Time  0809    PT Time Calculation (min)  49 min    Equipment Utilized During Treatment  Gait belt    Activity Tolerance  Patient tolerated treatment well    Behavior During Therapy  WFL for tasks assessed/performed       Past Medical History:  Diagnosis Date  . Diabetes mellitus without complication (HCC)   . Hypertension     Past Surgical History:  Procedure Laterality Date  . arm surgery Right    4x surgery as a child, cut arm falling through glass window   . HYDROCELE EXCISION / REPAIR    . REPLACEMENT TOTAL KNEE Right     There were no vitals filed for this visit.  Subjective Assessment - 03/29/19 0719    Subjective  Pt. states he feels his R hip/hamstring is getting better.  Pt. entered PT with use of RW.  No new complaints.    Pertinent History  Pt. known to PT clinic.  L shoulder/ RTC issues (needs stability program).  L knee bone on bone.  H/o R TKA (recent c/o R knee buckling/ fell yesterday).    Limitations  Lifting;Standing;Walking;House hold activities    Diagnostic tests  XRAYS    Patient Stated Goals  Improve balance/ gait independence    Currently in Pain?  Yes    Pain Score  3     Pain Location  Hip    Pain Orientation  Right         There.ex.:  Standing in //-bars: B shoulder flexion/ chest press/ biceps with wt. Wand (working on [employer]).  See GTB: bicep curls/ scap. Retraction  20x.    Standing hip ex. (4# wt.): marching/ heel raises/ hip abduction/ knee flexion/ walking in //-bars (cuing to increase hip flexion/ step pattern)- 20x each.  Standing step touches (4#)- 6" step with heel and toe strike.  Requires 1 UE assist on L (unable to complete with no UE assist due to fear of knee buckling).    Supine R/LE stretches (hamstring/ piriformis/ hip stretches).  R proximal hamstring tenderness noted with deep palpation.     Neuro.mm.:  Walking in //-bars (forward/lateral/backwards) with 1 UE assist 3x (mirror feedback/ cuing)  Walking in //-bars/ clinic with use of SPC and 2-point gait pattern.  Increase ataxic gait pattern with use of SPC as compared to RW.  No LOB extra time/ focus required.      Amb. outside with use of RW/ [facility] and correction of posture/ head position Cuing to increase hip flexion/ step pattern/ heel strike. Discussed walking down ramp outside and recommend pt. Has assist at home if he wants to walk down driveway.      PT Long Term Goals - 03/17/19 1452      PT LONG TERM GOAL #1   Title  Pt. will increase Berg balance test to >44/56 to improve safelty/ decrease fall risk.    Baseline  Initial Berg: 35/56 (high fall risk)    Time  4    Period  Weeks    Status  New    Target Date  04/13/19      PT LONG TERM GOAL #2   Title  Pt. will report no R hamstring tenderness/ pain with standing and daily functional tasks.    Baseline  (+) R hamstring tenderness.    Time  4    Period  Weeks    Status  New    Target Date  04/13/19      PT LONG TERM GOAL #3   Title  Pt. will ambulate 10 minutes with appropriate assistive device/ gait pattern on level surfaces safely.    Baseline  Antalgic gait pattern with use of RW.  Pt. fearful of L knee buckling.    Time  4    Period  Weeks    Status  New    Target Date  04/13/19      PT LONG TERM GOAL #4   Title  Pt. able to stand from recliner/ chair at home with 1 UE assist and maintain  balance with no LOB to improve safety.    Baseline  Heavy UE assist required to stand.  Pt. reaches for RW or wall to steady balance.    Time  4    Period  Weeks    Status  New    Target Date  04/13/19          Plan - 03/29/19 0720    Clinical Impression Statement  Limited R hand grasping with resisted there.ex. in standing posture.  Pt. continues to require RW with walking longer distances inside and definitely outside at this time.  Pt. demonstrates ability to amb. short distances on level surfaces in clinic from //-bars to mat table.  Decrease R hamstring discomfort with palpation and no reports of pain except with sit to stand from low mat table/ initial 3 steps.    Stability/Clinical Decision Making  Evolving/Moderate complexity    Clinical Decision Making  Moderate    Rehab Potential  Good    Clinical Impairments Affecting Rehab Potential  hx of knee surgery, hx of hip pain    PT Frequency  2x / week    PT Duration  4 weeks    PT Treatment/Interventions  ADLs/Self Care Home Management;Aquatic Therapy;Cryotherapy;Electrical Stimulation;DME Instruction;Fluidtherapy;Ultrasound;Traction;Moist Heat;Iontophoresis 4mg /ml Dexamethasone;Gait training;Stair training;Functional mobility training;Therapeutic activities;Therapeutic exercise;Balance training;Patient/family education;Neuromuscular re-education;Manual techniques;Compression bandaging;Taping;Energy conservation;Dry needling;Passive range of motion    PT Next Visit Plan  Progress HEP/ dynamic balance tasks.    PT Home Exercise Plan  Access Code: [email or web address]    Consulted and Agree with Plan of Care  Patient       Patient will benefit from skilled therapeutic intervention in order to improve the following deficits and impairments:  Abnormal gait, Decreased activity tolerance, Decreased balance, Decreased endurance, Decreased mobility, Decreased range of motion, Difficulty walking, Decreased strength, Hypomobility, Impaired flexibility,  Postural dysfunction, Improper body mechanics, Pain  Visit Diagnosis: Ataxia  Gait difficulty  Muscle weakness (generalized)     Problem List There are no problems to display for this patient.  Pura Spice, PT, DPT # [id number] 03/29/2019, 8:12 AM  [place] [address] [place], [place], [address] Phone: [phone]   Fax:  [phone]  Name: Roy [name]  Montez Hageman MRN: [id number] Date of Birth: 1945/10/27

## 2019-03-29 NOTE — Patient Instructions (Signed)
Access Code: [email or web address]  URL: [email or web address]  Date: 03/29/2019  Prepared by: Dorcas Carrow   Exercises  Seated Hamstring Stretch - 3 reps - 1 sets - 20 seconds hold - 2x daily - 7x weekly  Sit to Stand with Armchair - 10 reps - 2 sets - 2x daily - 7x weekly  Supine Shoulder Flexion Extension AAROM with Dowel - 20 reps - 1 sets - 1x daily - 7x weekly  Supine Shoulder Press with Dowel - 20 reps - 1 sets - 1x daily - 7x weekly  Supine Shoulder Abduction AAROM with Dowel - 20 reps - 1 sets - 1x daily - 7x weekly  Scapular Retraction with Resistance - 10 reps - 2 sets - 1x daily - 4x weekly  Standing Bicep Curls with Resistance - 10 reps - 2 sets - 1x daily - 4x weekly

## 2019-04-03 ENCOUNTER — Other Ambulatory Visit: Payer: Self-pay

## 2019-04-03 ENCOUNTER — Encounter: Payer: Self-pay | Admitting: Physical Therapy

## 2019-04-03 ENCOUNTER — Ambulatory Visit: Payer: Medicare HMO | Admitting: Physical Therapy

## 2019-04-03 DIAGNOSIS — M6281 Muscle weakness (generalized): Secondary | ICD-10-CM

## 2019-04-03 DIAGNOSIS — R270 Ataxia, unspecified: Secondary | ICD-10-CM | POA: Diagnosis not present

## 2019-04-03 DIAGNOSIS — R269 Unspecified abnormalities of gait and mobility: Secondary | ICD-10-CM

## 2019-04-03 NOTE — Therapy (Signed)
[facility] [address] [place], [place], [address] Phone: [phone]   Fax:  [phone]  Physical Therapy Treatment  Patient Details  Name: Roy [name]. MRN: [id number] Date of Birth: 09-21-1945 Referring Provider (PT): Dr. Theora Master   Encounter Date: 04/03/2019  PT End of Session - 04/03/19 0719    Visit Number  5    Number of Visits  8    Date for PT Re-Evaluation  04/13/19    Authorization - Visit Number  5    Authorization - Number of Visits  10    PT Start Time  0719    PT Stop Time  0810    PT Time Calculation (min)  51 min    Equipment Utilized During Treatment  Gait belt    Activity Tolerance  Patient tolerated treatment well    Behavior During Therapy  WFL for tasks assessed/performed       Past Medical History:  Diagnosis Date  . Diabetes mellitus without complication (HCC)   . Hypertension     Past Surgical History:  Procedure Laterality Date  . arm surgery Right    4x surgery as a child, cut arm falling through glass window   . HYDROCELE EXCISION / REPAIR    . REPLACEMENT TOTAL KNEE Right     There were no vitals filed for this visit.  Subjective Assessment - 04/03/19 0719    Subjective  Pt. states he did a lot of errands by himself yesterday, mostly with SPC but used the RW at [employer].  Pt. states he was really tired after all errands yesterday.  Pt. reports minimal R hamstring pain this morning (1-2/10).  Pt. states taking a shower has been easier but still will sit to wash feet due to knee issues.    Pertinent History  Pt. known to PT clinic.  L shoulder/ RTC issues (needs stability program).  L knee bone on bone.  H/o R TKA (recent c/o R knee buckling/ fell yesterday).    Limitations  Lifting;Standing;Walking;House hold activities    Diagnostic tests  XRAYS    Patient Stated Goals  Improve balance/ gait independence    Currently in Pain?  Yes    Pain Score  2     Pain Location  Hip     Pain Orientation  Right    Pain Descriptors / Indicators  Aching           There.ex.:  Step ups (6") with B UE assist on L/R at stairs.  Standing hamstring/ lunge with static holds 5x each.  Sit to stands 10x on blue mat table (discussed posture/ technique).  Seated/ standing hip ex. (4#wt.): marching/ heel raises/ hip abduction/ knee flexion/ LAQ in //-bars -20x each.  Standing step touches (4#)- 6" step with heel and toe strike 10x. Requires 1 UE assist on L.  Supine B LE stretches (hamstring/ piriformis/ hip stretches).R proximal hamstring tenderness noted with deep palpation.  Discussed sequencing for HEP/ issued new handouts.   Neuro.mm.:  Walking in //-bars(forward/lateral/backwards)with 1 UE assist 10x (mirror feedback/ cuing).  Seated rest break required after completing of reps (fatigue)  TUG: with RW x 3 (24.7 sec. average).  No SPC use due to pt. Fatigued.    Amb.outside with use of RW and correction of posture/ head position Cuing to increase hip flexion/ step pattern/ heel strike. Attempted walking down incline outside of clinic with CGA for safety.      PT  Long Term Goals - 03/17/19 1452      PT LONG TERM GOAL #1   Title  Pt. will increase Berg balance test to >44/56 to improve safelty/ decrease fall risk.    Baseline  Initial Berg: 35/56 (high fall risk)    Time  4    Period  Weeks    Status  New    Target Date  04/13/19      PT LONG TERM GOAL #2   Title  Pt. will report no R hamstring tenderness/ pain with standing and daily functional tasks.    Baseline  (+) R hamstring tenderness.    Time  4    Period  Weeks    Status  New    Target Date  04/13/19      PT LONG TERM GOAL #3   Title  Pt. will ambulate 10 minutes with appropriate assistive device/ gait pattern on level surfaces safely.    Baseline  Antalgic gait pattern with use of RW.  Pt. fearful of L knee buckling.    Time  4    Period  Weeks    Status  New    Target  Date  04/13/19      PT LONG TERM GOAL #4   Title  Pt. able to stand from recliner/ chair at home with 1 UE assist and maintain balance with no LOB to improve safety.    Baseline  Heavy UE assist required to stand.  Pt. reaches for RW or wall to steady balance.    Time  4    Period  Weeks    Status  New    Target Date  04/13/19          Plan - 04/03/19 0719    Clinical Impression Statement  Pt. requires several short seated rest breaks during ther.ex. due to fatigue.  Pt. had 1 episode of R hamstring discomfort with forward reaching/ lumbar flexion.  Pt. had no issues of knee buckling or discomfort.  Marked increase in R hamstring flexibility today with no tenderness with deep palpation.    Stability/Clinical Decision Making  Evolving/Moderate complexity    Clinical Decision Making  Moderate    Rehab Potential  Good    Clinical Impairments Affecting Rehab Potential  hx of knee surgery, hx of hip pain    PT Frequency  2x / week    PT Duration  4 weeks    PT Treatment/Interventions  ADLs/Self Care Home Management;Aquatic Therapy;Cryotherapy;Electrical Stimulation;DME Instruction;Fluidtherapy;Ultrasound;Traction;Moist Heat;Iontophoresis 4mg /ml Dexamethasone;Gait training;Stair training;Functional mobility training;Therapeutic activities;Therapeutic exercise;Balance training;Patient/family education;Neuromuscular re-education;Manual techniques;Compression bandaging;Taping;Energy conservation;Dry needling;Passive range of motion    PT Next Visit Plan  Progress HEP/ dynamic balance tasks.    PT Home Exercise Plan  Access Code: [email or web address]    Consulted and Agree with Plan of Care  Patient       Patient will benefit from skilled therapeutic intervention in order to improve the following deficits and impairments:  Abnormal gait, Decreased activity tolerance, Decreased balance, Decreased endurance, Decreased mobility, Decreased range of motion, Difficulty walking, Decreased strength, Hypomobility,  Impaired flexibility, Postural dysfunction, Improper body mechanics, Pain  Visit Diagnosis: Ataxia  Gait difficulty  Muscle weakness (generalized)     Problem List There are no problems to display for this patient.  Pura Spice, PT, DPT # [id number] 04/03/2019, 8:13 AM  [facility] [address] [place], [place], [address] Phone: [phone]   Fax:  [phone]  Name: Rendon Howell  Octavia Heir. MRN: [id number] Date of Birth: May 11, 1945

## 2019-04-05 ENCOUNTER — Other Ambulatory Visit: Payer: Self-pay

## 2019-04-05 ENCOUNTER — Encounter: Payer: Self-pay | Admitting: Physical Therapy

## 2019-04-05 ENCOUNTER — Ambulatory Visit: Payer: Medicare HMO | Admitting: Physical Therapy

## 2019-04-05 DIAGNOSIS — R270 Ataxia, unspecified: Secondary | ICD-10-CM | POA: Diagnosis not present

## 2019-04-05 DIAGNOSIS — M6281 Muscle weakness (generalized): Secondary | ICD-10-CM

## 2019-04-05 DIAGNOSIS — R269 Unspecified abnormalities of gait and mobility: Secondary | ICD-10-CM

## 2019-04-05 NOTE — Therapy (Signed)
[facility] [address] [place], [place], [address] Phone: [phone]   Fax:  [phone]  Physical Therapy Treatment  Patient Details  Name: Roy [name]. MRN: [id number] Date of Birth: 1945-11-27 Referring Provider (PT): Dr. Theora Master   Encounter Date: 04/05/2019  PT End of Session - 04/05/19 0718    Visit Number  6    Number of Visits  8    Date for PT Re-Evaluation  04/13/19    Authorization - Visit Number  6    Authorization - Number of Visits  10    PT Start Time  0718    PT Stop Time  0804    PT Time Calculation (min)  46 min    Equipment Utilized During Treatment  Gait belt    Activity Tolerance  Patient tolerated treatment well    Behavior During Therapy  [facility] for tasks assessed/performed       Past Medical History:  Diagnosis Date  . Diabetes mellitus without complication (HCC)   . Hypertension     Past Surgical History:  Procedure Laterality Date  . arm surgery Right    4x surgery as a child, cut arm falling through glass window   . HYDROCELE EXCISION / REPAIR    . REPLACEMENT TOTAL KNEE Right     There were no vitals filed for this visit.  Subjective Assessment - 04/05/19 0717    Subjective  Pt. reports he is having no R hamstring pain this morning.  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  Pt. states he is tired and may not be able to do a lot of walking this morning.    Pertinent History  Pt. known to PT clinic.  L shoulder/ RTC issues (needs stability program).  L knee bone on bone.  H/o R TKA (recent c/o R knee buckling/ fell yesterday).    Limitations  Lifting;Standing;Walking;House hold activities    Diagnostic tests  XRAYS    Patient Stated Goals  Improve balance/ gait independence    Currently in Pain?  No/denies         There.ex.:  Seated LAQ/ hip flexion/ hip abduction 20x.  No ankle weights today.  Increase static holds with knee  extension/DF.   Supine B LE stretches (hamstring/ piriformis/ hip stretches).No tenderness noted with deep palpation.  3x each.    Step ups (6") with B UE assist on L/R at stairs.  Standing hamstring/ lunge with static holds 5x each.  Sit to stands 10x on blue mat table (discussed posture/ technique).  Discussed HEP   Neuro.mm.:  Walking in //-bars(forward/lateral/backwards)with 1 UE assist 10x (mirror feedback/ cuing).  Seated rest break required after completing of reps (fatigue)   Amb. In hallway/outside with use of RW and correction of posture/ head position Cuing to increase hip flexion/ step pattern/ heel strike. Walking down curb with cuing for foot position.  Supine to sitting on mat table instruction (to L and R).      PT Long Term Goals - 03/17/19 1452      PT LONG TERM GOAL #1   Title  Pt. will increase Berg balance test to >44/56 to improve safelty/ decrease fall risk.    Baseline  Initial Berg: 35/56 (high fall risk)    Time  4    Period  Weeks    Status  New    Target Date  04/13/19  PT LONG TERM GOAL #2   Title  Pt. will report no R hamstring tenderness/ pain with standing and daily functional tasks.    Baseline  (+) R hamstring tenderness.    Time  4    Period  Weeks    Status  New    Target Date  04/13/19      PT LONG TERM GOAL #3   Title  Pt. will ambulate 10 minutes with appropriate assistive device/ gait pattern on level surfaces safely.    Baseline  Antalgic gait pattern with use of RW.  Pt. fearful of L knee buckling.    Time  4    Period  Weeks    Status  New    Target Date  04/13/19      PT LONG TERM GOAL #4   Title  Pt. able to stand from recliner/ chair at home with 1 UE assist and maintain balance with no LOB to improve safety.    Baseline  Heavy UE assist required to stand.  Pt. reaches for RW or wall to steady balance.    Time  4    Period  Weeks    Status  New    Target Date  04/13/19            Plan -  04/05/19 0718    Clinical Impression Statement  No palpable tenderness in R hamstring during manual stretches.  Pt. did have 1 episode of hamstring discomfort while going for supine to sitting on mat table.  PT instructed pt. in proper technique with bed mobility but limited by shoulder issues/ weakness/ decrease ROM.  Pt. fatigued with prolonge standing ex./ balance tasks at stairs.  No change to HEP at this time and PT will progress HEP next tx. session.    Stability/Clinical Decision Making  Evolving/Moderate complexity    Clinical Decision Making  Moderate    Rehab Potential  Good    Clinical Impairments Affecting Rehab Potential  hx of knee surgery, hx of hip pain    PT Frequency  2x / week    PT Duration  4 weeks    PT Treatment/Interventions  ADLs/Self Care Home Management;Aquatic Therapy;Cryotherapy;Electrical Stimulation;DME Instruction;Fluidtherapy;Ultrasound;Traction;Moist Heat;Iontophoresis 4mg /ml Dexamethasone;Gait training;Stair training;Functional mobility training;Therapeutic activities;Therapeutic exercise;Balance training;Patient/family education;Neuromuscular re-education;Manual techniques;Compression bandaging;Taping;Energy conservation;Dry needling;Passive range of motion    PT Next Visit Plan  Progress HEP/ dynamic balance tasks.  Issue new HEP/ check schedule.    PT Home Exercise Plan  Access Code: [email or web address]    Consulted and Agree with Plan of Care  Patient       Patient will benefit from skilled therapeutic intervention in order to improve the following deficits and impairments:  Abnormal gait, Decreased activity tolerance, Decreased balance, Decreased endurance, Decreased mobility, Decreased range of motion, Difficulty walking, Decreased strength, Hypomobility, Impaired flexibility, Postural dysfunction, Improper body mechanics, Pain  Visit Diagnosis: Ataxia  Gait difficulty  Muscle weakness (generalized)     Problem List There are no problems to display for this  patient.  Pura Spice, PT, DPT # [id number] 04/05/2019, 6:36 PM  [facility] [address] [place], [place], [address] Phone: [phone]   Fax:  [phone]  Name: Roy [name]. MRN: [id number] Date of Birth: 01-May-1945

## 2019-04-10 ENCOUNTER — Encounter: Payer: Self-pay | Admitting: Physical Therapy

## 2019-04-10 ENCOUNTER — Other Ambulatory Visit: Payer: Self-pay

## 2019-04-10 ENCOUNTER — Ambulatory Visit: Payer: Medicare HMO | Admitting: Physical Therapy

## 2019-04-10 DIAGNOSIS — R269 Unspecified abnormalities of gait and mobility: Secondary | ICD-10-CM

## 2019-04-10 DIAGNOSIS — R270 Ataxia, unspecified: Secondary | ICD-10-CM

## 2019-04-10 DIAGNOSIS — M6281 Muscle weakness (generalized): Secondary | ICD-10-CM

## 2019-04-10 NOTE — Therapy (Signed)
[facility] [address] [place], [place], [address] Phone: [phone]   Fax:  [phone]  Physical Therapy Treatment  Patient Details  Name: Roy [name]. MRN: [id number] Date of Birth: Aug 19, 1945 Referring Provider (PT): Dr. Gurney Maxin   Encounter Date: 04/10/2019  PT End of Session - 04/10/19 0723    Visit Number  7    Number of Visits  8    Date for PT Re-Evaluation  04/13/19    Authorization - Visit Number  7    Authorization - Number of Visits  10    PT Start Time  0723    PT Stop Time  0820    PT Time Calculation (min)  57 min    Equipment Utilized During Treatment  Gait belt    Activity Tolerance  Patient tolerated treatment well;Patient limited by fatigue    Behavior During Therapy  WFL for tasks assessed/performed       Past Medical History:  Diagnosis Date  . Diabetes mellitus without complication ([place])   . Hypertension     Past Surgical History:  Procedure Laterality Date  . arm surgery Right    4x surgery as a child, cut arm falling through glass window   . HYDROCELE EXCISION / REPAIR    . REPLACEMENT TOTAL KNEE Right     There were no vitals filed for this visit.  Subjective Assessment - 04/10/19 0723    Subjective  MD f/u 04/17/2019 with Dr. Melrose Nakayama.  Pt. reports 4/10 R hip pain and 20/10 L shoulder pain.  Pt. states he over did it yesterday putting grill together.    Pertinent History  Pt. known to PT clinic.  L shoulder/ RTC issues (needs stability program).  L knee bone on bone.  H/o R TKA (recent c/o R knee buckling/ fell yesterday).    Limitations  Lifting;Standing;Walking;House hold activities    Diagnostic tests  XRAYS    Patient Stated Goals  Improve balance/ gait independence    Currently in Pain?  Yes    Pain Score  4     Pain Location  Hip    Pain Orientation  Right    Pain Type  Chronic pain         There.ex.:   Seated (4#) LAQ/ hip flexion/ toe and heel raises 20x.   Standing hip extension/ abduction/ walking forward in //-bars with bilateral to 1 UE assist.   SupineBLE stretches (hamstring/ piriformis/ hip stretches).No tenderness noted with deep palpation.  3x each.    Sit to stands 10x from chair with added height.    Discussed sh. Ex.   Neuro.mm.:  Walking in //-bars(forward/lateral/backwards)with 1 UE assist10x (mirror feedback/ cuing). Added L/R lunges forward/backwards (difficulty with increase R LE wt. Bearing during L step pattern).    Turning CW/CCW with no UE assist 3x (in //-bars with SBA)- L knee locked  Walking in clinic with SPC on L with 2-point gait pattern and cuing for posture correction.  Pt. Ambulates 110 feet prior to fatigue/ requested seated break.    Amb. In hallway/outside with use of RW and correction of posture/ head position Cuing to increase hip flexion/ step pattern/ heel strike. Walking up/down curb with cuing for foot position.    PT Long Term Goals - 03/17/19 1452      PT LONG TERM GOAL #1   Title  Pt. will increase Berg balance test to >44/56 to improve safelty/ decrease fall risk.  Baseline  Initial Berg: 35/56 (high fall risk)    Time  4    Period  Weeks    Status  New    Target Date  04/13/19      PT LONG TERM GOAL #2   Title  Pt. will report no R hamstring tenderness/ pain with standing and daily functional tasks.    Baseline  (+) R hamstring tenderness.    Time  4    Period  Weeks    Status  New    Target Date  04/13/19      PT LONG TERM GOAL #3   Title  Pt. will ambulate 10 minutes with appropriate assistive device/ gait pattern on level surfaces safely.    Baseline  Antalgic gait pattern with use of RW.  Pt. fearful of L knee buckling.    Time  4    Period  Weeks    Status  New    Target Date  04/13/19      PT LONG TERM GOAL #4   Title  Pt. able to stand from recliner/ chair at home with 1 UE assist and maintain balance with no LOB to improve safety.    Baseline  Heavy  UE assist required to stand.  Pt. reaches for RW or wall to steady balance.    Time  4    Period  Weeks    Status  New    Target Date  04/13/19            Plan - 04/10/19 0724    Clinical Impression Statement  No increase c/o R hip/hamstring discomfort during tx. session.  Pt. limited with gait while using SPC/ SBA due to moderate fatigue at >100 feet.  Antaligic gait with decrease step length/ consistent hip flexion towards end of tx. due to generalized LE muscle fatigue.  Pt. will continue with use of RW with all aspects of gait to improve gait pattern/ safety.  L knee flexion limited by pain/ instability/ fear of buckling.    Stability/Clinical Decision Making  Evolving/Moderate complexity    Clinical Decision Making  Moderate    Rehab Potential  Good    Clinical Impairments Affecting Rehab Potential  hx of knee surgery, hx of hip pain    PT Frequency  2x / week    PT Duration  4 weeks    PT Treatment/Interventions  ADLs/Self Care Home Management;Aquatic Therapy;Cryotherapy;Electrical Stimulation;DME Instruction;Fluidtherapy;Ultrasound;Traction;Moist Heat;Iontophoresis 4mg /ml Dexamethasone;Gait training;Stair training;Functional mobility training;Therapeutic activities;Therapeutic exercise;Balance training;Patient/family education;Neuromuscular re-education;Manual techniques;Compression bandaging;Taping;Energy conservation;Dry needling;Passive range of motion    PT Next Visit Plan  Progress HEP/ dynamic balance tasks.  Send MD progress note for 04/17/2019    PT Home Exercise Plan  Access Code: [email or web address]    Consulted and Agree with Plan of Care  Patient       Patient will benefit from skilled therapeutic intervention in order to improve the following deficits and impairments:  Abnormal gait, Decreased activity tolerance, Decreased balance, Decreased endurance, Decreased mobility, Decreased range of motion, Difficulty walking, Decreased strength, Hypomobility, Impaired flexibility, Postural  dysfunction, Improper body mechanics, Pain  Visit Diagnosis: Ataxia  Gait difficulty  Muscle weakness (generalized)     Problem List There are no problems to display for this patient.  [name], PT, DPT # [phone] 04/10/2019, 2:21 PM  [facility] [address] [place], [place], [address] Phone: [phone]   Fax:  [phone]  Name: Roy [name]. MRN: [id number]  Date of Birth: 1945/08/04

## 2019-04-12 ENCOUNTER — Other Ambulatory Visit: Payer: Self-pay

## 2019-04-12 ENCOUNTER — Ambulatory Visit: Payer: Medicare HMO | Admitting: Physical Therapy

## 2019-04-12 DIAGNOSIS — R270 Ataxia, unspecified: Secondary | ICD-10-CM

## 2019-04-12 DIAGNOSIS — R269 Unspecified abnormalities of gait and mobility: Secondary | ICD-10-CM

## 2019-04-12 DIAGNOSIS — M6281 Muscle weakness (generalized): Secondary | ICD-10-CM

## 2019-04-12 NOTE — Therapy (Signed)
[facility] [address]. [place], [place], [address] Phone: [phone]   Fax:  [phone]  Physical Therapy Treatment  Patient Details  Name: Roy [name]. MRN: [id number] Date of Birth: [birth year] Referring Provider (PT): Dr. Gurney Maxin   Encounter Date: 04/12/2019  PT End of Session - 04/12/19 0728    Visit Number  8    Number of Visits  8    Date for PT Re-Evaluation  04/13/19    Authorization - Visit Number  8    Authorization - Number of Visits  10    PT Start Time  0721    PT Stop Time  0824    PT Time Calculation (min)  63 min    Equipment Utilized During Treatment  Gait belt    Activity Tolerance  Patient tolerated treatment well;Patient limited by fatigue    Behavior During Therapy  WFL for tasks assessed/performed       Past Medical History:  Diagnosis Date  . Diabetes mellitus without complication ([place])   . Hypertension     Past Surgical History:  Procedure Laterality Date  . arm surgery Right    4x surgery as a child, cut arm falling through glass window   . HYDROCELE EXCISION / REPAIR    . REPLACEMENT TOTAL KNEE Right     There were no vitals filed for this visit.  Subjective Assessment - 04/12/19 0725    Subjective  Pt. reports 2/10 R hip pain walking into PT clinic with use of RW.  Pt. continues to c/o significant L shoulder pain with movement.  Pt. has been helping wife since her foot procedure the other day.    Pertinent History  Pt. known to PT clinic.  L shoulder/ RTC issues (needs stability program).  L knee bone on bone.  H/o R TKA (recent c/o R knee buckling/ fell yesterday).    Limitations  Lifting;Standing;Walking;House hold activities    Diagnostic tests  XRAYS    Patient Stated Goals  Improve balance/ gait independence    Currently in Pain?  Yes    Pain Score  2     Pain Location  Hip    Pain Orientation  Right    Pain Descriptors / Indicators  Aching        There.ex.:    Resisted gait 2BTB 5x all planes (cuing for posture correction).    Sit to stands 10x from chair with added height.    Reviewed HEP   Manual tx.:   Supine LE/lumbar stretches on mat table (focus on R hip stretches: piriformis/ ITB/ hamstring/ rotn.)- 9 min.    Hypervolt Percussion to R hip/ITB/hamstring musculature (no c/o pain, pt. Reports "feels good").   Neuro.mm.:  Walking in //-bars(forward/lateral/backwards)with 1 UE assist10x (mirror feedback/ cuing). Cuing to increase hip flexion/ step length and upright posture.    Walking in clinic with SPC on L with 2-point gait pattern and cuing for posture correction.  Pt. Ambulates 145 feet (increase SOB/ fatigue)- HR 59 bpm/ O2 sat 98%.    Turning CW/CCW with no UE assist 3x (in //-bars with SBA)- L knee locked   Walking cone taps on L/R with B UE assist (unable to progress to 1 UE with R hip flexion due to L knee stability issues).    Amb.In hallway/outside with use of RW and correction of posture/ head position Cuing to increase hip flexion/ step pattern/ heel strike. Walking down slight decline with cuing  to correct posture/ step length    PT Long Term Goals - 03/17/19 1452      PT LONG TERM GOAL #1   Title  Pt. will increase Berg balance test to >44/56 to improve safelty/ decrease fall risk.    Baseline  Initial Berg: 35/56 (high fall risk)    Time  4    Period  Weeks    Status  New    Target Date  04/13/19      PT LONG TERM GOAL #2   Title  Pt. will report no R hamstring tenderness/ pain with standing and daily functional tasks.    Baseline  (+) R hamstring tenderness.    Time  4    Period  Weeks    Status  New    Target Date  04/13/19      PT LONG TERM GOAL #3   Title  Pt. will ambulate 10 minutes with appropriate assistive device/ gait pattern on level surfaces safely.    Baseline  Antalgic gait pattern with use of RW.  Pt. fearful of L knee buckling.    Time  4    Period  Weeks    Status   New    Target Date  04/13/19      PT LONG TERM GOAL #4   Title  Pt. able to stand from recliner/ chair at home with 1 UE assist and maintain balance with no LOB to improve safety.    Baseline  Heavy UE assist required to stand.  Pt. reaches for RW or wall to steady balance.    Time  4    Period  Weeks    Status  New    Target Date  04/13/19            Plan - 04/12/19 0729    Clinical Impression Statement  Pt. has R mid-ITB discomfort with sit to stands but decrease c/o pain/ tightness after stretches/ manual techniques.  Pt. able to ambulats increase distance today but limited by moderate fatigue.  No LOB during tx. session but pt. very careful to full extension L knee during R LE swingthrough phase of gait.  Pt. benefit from RW due to muscle weakness/ fatigue/ balance issues.  Pt. will continue to benefit from skilled PT with focus on LE strengthening/ balance with gait.    Stability/Clinical Decision Making  Evolving/Moderate complexity    Clinical Decision Making  Moderate    Rehab Potential  Good    Clinical Impairments Affecting Rehab Potential  hx of knee surgery, hx of hip pain    PT Frequency  2x / week    PT Duration  4 weeks    PT Treatment/Interventions  ADLs/Self Care Home Management;Aquatic Therapy;Cryotherapy;Electrical Stimulation;DME Instruction;Fluidtherapy;Ultrasound;Traction;Moist Heat;Iontophoresis 4mg /ml Dexamethasone;Gait training;Stair training;Functional mobility training;Therapeutic activities;Therapeutic exercise;Balance training;Patient/family education;Neuromuscular re-education;Manual techniques;Compression bandaging;Taping;Energy conservation;Dry needling;Passive range of motion    PT Next Visit Plan  Progress HEP/ dynamic balance tasks.  Discuss MD f/u on 04/17/2019.  RECERT next tx. session    PT Home Exercise Plan  Access Code: [email or web address]    Consulted and Agree with Plan of Care  Patient       Patient will benefit from skilled therapeutic intervention in  order to improve the following deficits and impairments:  Abnormal gait, Decreased activity tolerance, Decreased balance, Decreased endurance, Decreased mobility, Decreased range of motion, Difficulty walking, Decreased strength, Hypomobility, Impaired flexibility, Postural dysfunction, Improper body mechanics, Pain  Visit Diagnosis: Ataxia  Gait difficulty  Muscle weakness (  generalized)     Problem List There are no problems to display for this patient.  [name], PT, DPT # [phone] 04/12/2019, 8:50 AM  [facility] [address] [place], [place], [address] Phone: [phone]   Fax:  [phone]  Name: Roy [name]. MRN: [id number] Date of Birth: 1945/10/27

## 2019-04-17 ENCOUNTER — Encounter: Payer: Medicare HMO | Admitting: Physical Therapy

## 2019-04-19 ENCOUNTER — Ambulatory Visit: Payer: Medicare HMO | Attending: Neurology | Admitting: Physical Therapy

## 2019-04-19 ENCOUNTER — Other Ambulatory Visit: Payer: Self-pay

## 2019-04-19 ENCOUNTER — Encounter: Payer: Self-pay | Admitting: Physical Therapy

## 2019-04-19 DIAGNOSIS — M6281 Muscle weakness (generalized): Secondary | ICD-10-CM | POA: Insufficient documentation

## 2019-04-19 DIAGNOSIS — R270 Ataxia, unspecified: Secondary | ICD-10-CM | POA: Insufficient documentation

## 2019-04-19 DIAGNOSIS — R269 Unspecified abnormalities of gait and mobility: Secondary | ICD-10-CM

## 2019-04-19 NOTE — Therapy (Signed)
[facility] [address] [place], [place], [address] Phone: [phone]   Fax:  [phone]  Physical Therapy Treatment  Patient Details  Name: Roy [name]. MRN: [id number] Date of Birth: 1945/12/05 Referring Provider (PT): Dr. Gurney Maxin   Encounter Date: 04/19/2019  PT End of Session - 04/19/19 0714    Visit Number  9    Number of Visits  17    Date for PT Re-Evaluation  05/17/19    Authorization - Visit Number  1    Authorization - Number of Visits  10    PT Start Time  0722    PT Stop Time  0821    PT Time Calculation (min)  59 min    Equipment Utilized During Treatment  Gait belt    Activity Tolerance  Patient tolerated treatment well;Patient limited by fatigue    Behavior During Therapy  WFL for tasks assessed/performed       Past Medical History:  Diagnosis Date  . Diabetes mellitus without complication ([place])   . Hypertension     Past Surgical History:  Procedure Laterality Date  . arm surgery Right    4x surgery as a child, cut arm falling through glass window   . HYDROCELE EXCISION / REPAIR    . REPLACEMENT TOTAL KNEE Right     There were no vitals filed for this visit.  Subjective Assessment - 04/19/19 0713    Subjective  Pt. had f/u with Dr. Lannie Fields office on Monday.  Pt. states the f/u visit was uneventful and pt. was upset about not receiving any answers.  No falls reported. Pt. reports a couple days of marked increase in L knee pain (no pain today).    Pertinent History  Pt. known to PT clinic.  L shoulder/ RTC issues (needs stability program).  L knee bone on bone.  H/o R TKA (recent c/o R knee buckling/ fell yesterday).    Limitations  Lifting;Standing;Walking;House hold activities    Diagnostic tests  XRAYS    Patient Stated Goals  Improve balance/ gait independence    Currently in Pain?  No/denies    Pain Score  --    Pain Location  --    Pain Orientation  --    Pain Descriptors /  Indicators  --        Neuro.mm.:  Walking in //-bars(forward/lateral/backwards)with 1 UE assist10x (mirror feedback/ cuing). Cuing to increase hip flexion/ step length and upright posture.    Berg balance test: 37/56 (slight improvement)  Turning CW/CCW with no UE assist 3x (in //-bars with SBA)- L knee locked  Standing step touches (toe/heel with 1 UE required)- 20x     Amb.In hallway/outside with use of RW and correction of posture/ head position Cuing to increase hip flexion/ step pattern/ heel strike. Walking down slight decline with cuing to correct posture/ step length   There.ex.:  Sit to stands 8x.    Standing hip flexion/ extension/ lateral walking/ heel raises 20x each at mirror with occasional seated rest break required.    Resisted gait 2BTB 5x all planes (cuing for posture correction).     No c/o R hip pain.  No manual stretches or STM today.       [facility] PT Assessment - 04/19/19 0001      Assessment   Medical Diagnosis  Ataxia/ Gait difficulty    Referring Provider (PT)  Dr. Gurney Maxin    Onset Date/Surgical Date  12/12/18  [place] residence      Prior Function   Level of Independence  Independent       PT Long Term Goals - 04/19/19 3734      PT LONG TERM GOAL #1   Title  Pt. will increase Berg balance test to >44/56 to improve safelty/ decrease fall risk.    Baseline  Initial Berg: 35/56 (high fall risk).  04/19/2019: 37/56 (slight improvement)- limited by L knee issues    Time  4    Period  Weeks    Status  Not Met    Target Date  05/17/19      PT LONG TERM GOAL #2   Title  Pt. will report no R hamstring tenderness/ pain with standing and daily functional tasks.    Baseline  (+) R hamstring tenderness.  On 1/7: no c/o pain but pt. states he will occasionally have discomfort.  Overall improving    Time  4    Period  Weeks    Status  Partially Met    Target Date  05/17/19      PT  LONG TERM GOAL #3   Title  Pt. will ambulate 10 minutes with appropriate assistive device/ gait pattern on level surfaces safely.    Baseline  Antalgic gait pattern with use of RW.  Pt. fearful of L knee buckling.  1/7: pt. continues to benefit from use of RW and increase distance walked.  Limited by fatigue    Time  4    Period  Weeks    Status  Partially Met    Target Date  05/17/19      PT LONG TERM GOAL #4   Title  Pt. able to stand from recliner/ chair at home with 1 UE assist and maintain balance with no LOB to improve safety.    Baseline  Heavy UE assist required to stand.  Pt. reaches for RW or wall to steady balance.  1/7: improving, progress limited by L knee pain    Time  4    Period  Weeks    Status  Partially Met    Target Date  05/17/19            Plan - 04/19/19 0715    Clinical Impression Statement  No c/o R hip or hamstring discomfort t/o tx. session and with palpation by PT.  Pt. remains limited with standing/walking by L knee pain/ fear of buckling and pt. focused on full L knee extension during wt. bearing/ R swing through phase of gait.  Pt. has shown improvement with Berg balance test 37/57 but remains a high fall risk and will continue to benefit from RW.  Pt. will continue to benefit from LE strengthening/ dynamic balance tasks to improve gait/ decrease fall risk.    Stability/Clinical Decision Making  Evolving/Moderate complexity    Clinical Decision Making  Moderate    Rehab Potential  Good    Clinical Impairments Affecting Rehab Potential  hx of knee surgery, hx of hip pain    PT Frequency  2x / week    PT Duration  4 weeks    PT Treatment/Interventions  ADLs/Self Care Home Management;Aquatic Therapy;Cryotherapy;Electrical Stimulation;DME Instruction;Fluidtherapy;Ultrasound;Traction;Moist Heat;Iontophoresis 46m/ml Dexamethasone;Gait training;Stair training;Functional mobility training;Therapeutic activities;Therapeutic exercise;Balance training;Patient/family  education;Neuromuscular re-education;Manual techniques;Compression bandaging;Taping;Energy conservation;Dry needling;Passive range of motion    PT Next Visit Plan  Progress HEP/ dynamic balance tasks.    PT Home Exercise Plan  Access Code: [email or web address]  Consulted and Agree with Plan of Care  Patient       Patient will benefit from skilled therapeutic intervention in order to improve the following deficits and impairments:  Abnormal gait, Decreased activity tolerance, Decreased balance, Decreased endurance, Decreased mobility, Decreased range of motion, Difficulty walking, Decreased strength, Hypomobility, Impaired flexibility, Postural dysfunction, Improper body mechanics, Pain  Visit Diagnosis: Ataxia  Gait difficulty  Muscle weakness (generalized)     Problem List There are no problems to display for this patient.  Pura Spice, PT, DPT # [phone] 04/19/2019, 9:20 AM  [facility] [address] [place], [place], [address] Phone: [phone]   Fax:  [phone]  Name: Ilean China. MRN: [id number] Date of Birth: 01-04-1946

## 2019-04-24 ENCOUNTER — Other Ambulatory Visit: Payer: Self-pay

## 2019-04-24 ENCOUNTER — Ambulatory Visit: Payer: Medicare HMO | Admitting: Physical Therapy

## 2019-04-24 ENCOUNTER — Encounter: Payer: Self-pay | Admitting: Physical Therapy

## 2019-04-24 DIAGNOSIS — R269 Unspecified abnormalities of gait and mobility: Secondary | ICD-10-CM

## 2019-04-24 DIAGNOSIS — R270 Ataxia, unspecified: Secondary | ICD-10-CM

## 2019-04-24 DIAGNOSIS — M6281 Muscle weakness (generalized): Secondary | ICD-10-CM

## 2019-04-24 NOTE — Therapy (Signed)
[facility] [address] [place], [place], [address] Phone: [phone]   Fax:  [phone]  Physical Therapy Treatment  Patient Details  Name: Roy [name]. MRN: [id number] Date of Birth: 07-Jun-1945 Referring Provider (PT): Dr. Gurney Maxin   Encounter Date: 04/24/2019  PT End of Session - 04/24/19 0920    Visit Number  10    Number of Visits  17    Date for PT Re-Evaluation  05/17/19    Authorization - Visit Number  2    Authorization - Number of Visits  10    PT Start Time  0724    PT Stop Time  0821    PT Time Calculation (min)  57 min    Activity Tolerance  Patient tolerated treatment well;Patient limited by fatigue;Patient limited by pain    Behavior During Therapy  [facility] for tasks assessed/performed       Past Medical History:  Diagnosis Date  . Diabetes mellitus without complication ([place])   . Hypertension     Past Surgical History:  Procedure Laterality Date  . arm surgery Right    4x surgery as a child, cut arm falling through glass window   . HYDROCELE EXCISION / REPAIR    . REPLACEMENT TOTAL KNEE Right     There were no vitals filed for this visit.  Subjective Assessment - 04/24/19 0916    Subjective  Pt. reports discomfort in L shoulder with shoulder abduction and pain in the L knee.  Pt. reports that he does not trust the L knee and still requires usage of the walker to feel secure. No falls reported.    Limitations  Walking;Standing;Lifting;House hold activities    Patient Stated Goals  Improve balance/ gait independence    Currently in Pain?  Yes    Pain Score  8    No score given   Pain Location  Knee    Pain Orientation  Left    Pain Descriptors / Indicators  Aching    Pain Type  Chronic pain    Pain Onset  More than a month ago    Pain Frequency  Constant    Multiple Pain Sites  Yes    Pain Score  10    Pain Location  Shoulder    Pain Descriptors / Indicators  Discomfort    Pain  Frequency  Intermittent    Aggravating Factors   R shoulder abduction         There.ex.:  Supine SAQ (max. Manual resistance)/ marching (light assist with L hip due to increase back discomfort)/ glut. Sets/ partial bridging/ Seated scap. Retraction GTB  Supine hamstring (prox./distal)/ trunk rotn./ ITB/ hip stretches 5x each with holds (pain tolerable).  PT assist required for all aspects of stretching on mat table.    Sit to stands 5x (min. To no UE assist).   Standing toe touches at 6" step with B UE assist (cuing to use 1 UE but pt. prefers B UE due L knee issues).   Walking in //-bars(forward/lateral/backwards)with 1 UE assist10x (mirror feedback/ cuing). Cuing to increase hip flexion/ step length and upright posture.  Reviewed HEP   PT will issue new HEP next tx. Session.     PT Long Term Goals - 04/19/19 0914      PT LONG TERM GOAL #1   Title  Pt. will increase Berg balance test to >44/56 to improve safelty/ decrease fall risk.    Baseline  Initial Berg: 35/56 (high fall risk).  04/19/2019: 37/56 (slight improvement)- limited by L knee issues    Time  4    Period  Weeks    Status  Not Met    Target Date  05/17/19      PT LONG TERM GOAL #2   Title  Pt. will report no R hamstring tenderness/ pain with standing and daily functional tasks.    Baseline  (+) R hamstring tenderness.  On 1/7: no c/o pain but pt. states he will occasionally have discomfort.  Overall improving    Time  4    Period  Weeks    Status  Partially Met    Target Date  05/17/19      PT LONG TERM GOAL #3   Title  Pt. will ambulate 10 minutes with appropriate assistive device/ gait pattern on level surfaces safely.    Baseline  Antalgic gait pattern with use of RW.  Pt. fearful of L knee buckling.  1/7: pt. continues to benefit from use of RW and increase distance walked.  Limited by fatigue    Time  4    Period  Weeks    Status  Partially Met    Target Date  05/17/19      PT LONG TERM  GOAL #4   Title  Pt. able to stand from recliner/ chair at home with 1 UE assist and maintain balance with no LOB to improve safety.    Baseline  Heavy UE assist required to stand.  Pt. reaches for RW or wall to steady balance.  1/7: improving, progress limited by L knee pain    Time  4    Period  Weeks    Status  Partially Met    Target Date  05/17/19          Plan - 04/24/19 4097    Clinical Impression Statement  Pt demonstrated improvement in proximal hamstring length by achieveing 80 degrees of hip felxion in R hip and 85 deg. in L hip. Pt. tolerated manual IT band, piriformis, glut and lower back stretches well bilaterally. Pt had pain at anterior L shoulder during GTB scap retraction. Pt was able to perform marching with mirror feedback with use of only one hand within //-bars. Pt had to use two hands during foot step tapping to decrease his fear of lossing balance due to his L knee buckling. Pt maintains L knee extension during gait and balance activities to decrease fear of knee buckling.    Examination-Activity Limitations  Squat;Stairs;Stand    Stability/Clinical Decision Making  Evolving/Moderate complexity    Rehab Potential  Good    Clinical Impairments Affecting Rehab Potential  hx of knee surgery, hx of hip pain    PT Frequency  2x / week    PT Duration  4 weeks    PT Treatment/Interventions  ADLs/Self Care Home Management;Aquatic Therapy;Cryotherapy;Electrical Stimulation;DME Instruction;Fluidtherapy;Ultrasound;Traction;Moist Heat;Iontophoresis 82m/ml Dexamethasone;Gait training;Stair training;Functional mobility training;Therapeutic activities;Therapeutic exercise;Balance training;Patient/family education;Neuromuscular re-education;Manual techniques;Compression bandaging;Taping;Energy conservation;Dry needling;Passive range of motion    PT Next Visit Plan  Progress HEP/ dynamic balance tasks.    PT Home Exercise Plan  Access Code: [email or web address]    Consulted and Agree with Plan of  Care  Patient       Patient will benefit from skilled therapeutic intervention in order to improve the following deficits and impairments:  Abnormal gait, Decreased activity tolerance, Decreased balance, Decreased endurance, Decreased mobility, Decreased range of motion, Difficulty walking, Decreased strength, Hypomobility, Impaired  flexibility, Postural dysfunction, Improper body mechanics, Pain  Visit Diagnosis: Ataxia  Gait difficulty  Muscle weakness (generalized)     Problem List There are no problems to display for this patient.  Pura Spice, PT, DPT # [id number] 04/24/2019, 9:51 AM  [facility] [address] [place], [place], [address] Phone: [phone]   Fax:  [phone]  Name: Roy [name]. MRN: [id number] Date of Birth: July 16, 1945

## 2019-04-26 ENCOUNTER — Ambulatory Visit: Payer: Medicare HMO | Admitting: Physical Therapy

## 2019-04-26 ENCOUNTER — Other Ambulatory Visit: Payer: Self-pay

## 2019-04-26 ENCOUNTER — Encounter: Payer: Self-pay | Admitting: Physical Therapy

## 2019-04-26 DIAGNOSIS — R270 Ataxia, unspecified: Secondary | ICD-10-CM | POA: Diagnosis not present

## 2019-04-26 DIAGNOSIS — M6281 Muscle weakness (generalized): Secondary | ICD-10-CM

## 2019-04-26 DIAGNOSIS — R269 Unspecified abnormalities of gait and mobility: Secondary | ICD-10-CM

## 2019-04-26 NOTE — Therapy (Signed)
[facility] [address] [place], [place], [address] Phone: [phone]   Fax:  [phone]  Physical Therapy Treatment  Patient Details  Name: Roy [name]. MRN: [id number] Date of Birth: 01-27-1946 Referring Provider (PT): Dr. Gurney Maxin   Encounter Date: 04/26/2019  PT End of Session - 04/26/19 0822    Visit Number  11    Number of Visits  17    Date for PT Re-Evaluation  05/17/19    Authorization - Visit Number  3    Authorization - Number of Visits  10    PT Start Time  0718    PT Stop Time  0814    PT Time Calculation (min)  56 min    Equipment Utilized During Treatment  Gait belt    Activity Tolerance  Patient tolerated treatment well    Behavior During Therapy  WFL for tasks assessed/performed       Past Medical History:  Diagnosis Date  . Diabetes mellitus without complication ([place])   . Hypertension     Past Surgical History:  Procedure Laterality Date  . arm surgery Right    4x surgery as a child, cut arm falling through glass window   . HYDROCELE EXCISION / REPAIR    . REPLACEMENT TOTAL KNEE Right     There were no vitals filed for this visit.  Subjective Assessment - 04/26/19 0817    Subjective  Pt. reports that his R hamstring feels good with hypervolt. Pt. reports he may fall forward if he does not hold on the bar in front of him for support during side walking within //-bars due to his L knee buckling when he bends his L knee.    Pertinent History  Pt. known to PT clinic.  L shoulder/ RTC issues (needs stability program).  L knee bone on bone.  H/o R TKA (recent c/o R knee buckling/ fell yesterday).    Limitations  Walking;Standing;Lifting;House hold activities    Patient Stated Goals  Improve balance/ gait independence    Currently in Pain?  Yes    Pain Score  --   no objective number was given   Pain Location  Knee    Pain Orientation  Left    Pain Descriptors / Indicators  Aching     Pain Type  Chronic pain    Pain Onset  More than a month ago    Pain Frequency  Constant    Pain Score  10   no objective number was given   Pain Location  Shoulder    Pain Descriptors / Indicators  Discomfort    Pain Frequency  Intermittent        Objective:  Therex:   Supine GTB hip abduction with knees bent 20 reps x 1 set Seated marching 15 reps x 1 set Supine quad sets 15 reps x 1 set bilaterally Supine glut sets 10 reps x 1 set   Manual Therapy:  Supine hamstring/piriformis/glut stretches 30 s x 1 set bilaterally Hypervolt at R mid hamstring at sidelying position 5 mins    Neuro:  Forward marching within //-bars x 3 rounds SBA Side walking within //-bars with cones reaching x 10 rounds SBA        PT Long Term Goals - 04/19/19 0914      PT LONG TERM GOAL #1   Title  Pt. will increase Berg balance test to >44/56 to improve safelty/ decrease fall risk.  Baseline  Initial Berg: 35/56 (high fall risk).  04/19/2019: 37/56 (slight improvement)- limited by L knee issues    Time  4    Period  Weeks    Status  Not Met    Target Date  05/17/19      PT LONG TERM GOAL #2   Title  Pt. will report no R hamstring tenderness/ pain with standing and daily functional tasks.    Baseline  (+) R hamstring tenderness.  On 1/7: no c/o pain but pt. states he will occasionally have discomfort.  Overall improving    Time  4    Period  Weeks    Status  Partially Met    Target Date  05/17/19      PT LONG TERM GOAL #3   Title  Pt. will ambulate 10 minutes with appropriate assistive device/ gait pattern on level surfaces safely.    Baseline  Antalgic gait pattern with use of RW.  Pt. fearful of L knee buckling.  1/7: pt. continues to benefit from use of RW and increase distance walked.  Limited by fatigue    Time  4    Period  Weeks    Status  Partially Met    Target Date  05/17/19      PT LONG TERM GOAL #4   Title  Pt. able to stand from recliner/ chair at home with 1 UE  assist and maintain balance with no LOB to improve safety.    Baseline  Heavy UE assist required to stand.  Pt. reaches for RW or wall to steady balance.  1/7: improving, progress limited by L knee pain    Time  4    Period  Weeks    Status  Partially Met    Target Date  05/17/19            Plan - 04/26/19 9038    Clinical Impression Statement  Pt. had discomfort at his lower back while doing glut set exercise but the symptoms ease when modified slightly.  Pt. tolerated supine hamstring/piriformis/gluts stretches well without pain in his R hamstring  Pt. tolerated quad sets and standing/balance exercises well without reported pain. Pt. demonstrated good L shoulder ROM in flexion and abduction during overhead cone reaching. Pt. had to use two hands for balance during side steps within //-bars due to his fear of L knee buckling. Pt will benefit from continue balance, LE strengthening exercises to decrease fall risk and increase confidence in his L knee during ADLs.    Examination-Activity Limitations  Squat;Stairs;Stand    Stability/Clinical Decision Making  Evolving/Moderate complexity    Clinical Decision Making  Moderate    Rehab Potential  Good    Clinical Impairments Affecting Rehab Potential  hx of knee surgery, hx of hip pain    PT Frequency  2x / week    PT Duration  4 weeks    PT Treatment/Interventions  ADLs/Self Care Home Management;Aquatic Therapy;Cryotherapy;Electrical Stimulation;DME Instruction;Fluidtherapy;Ultrasound;Traction;Moist Heat;Iontophoresis 12m/ml Dexamethasone;Gait training;Stair training;Functional mobility training;Therapeutic activities;Therapeutic exercise;Balance training;Patient/family education;Neuromuscular re-education;Manual techniques;Compression bandaging;Taping;Energy conservation;Dry needling;Passive range of motion    PT Next Visit Plan  Progress HEP/ dynamic balance tasks.    PT Home Exercise Plan  Access Code: [email or web address]    Consulted and Agree with  Plan of Care  Patient       Patient will benefit from skilled therapeutic intervention in order to improve the following deficits and impairments:  Abnormal gait, Decreased activity tolerance, Decreased balance, Decreased endurance, Decreased mobility,  Decreased range of motion, Difficulty walking, Decreased strength, Hypomobility, Impaired flexibility, Postural dysfunction, Improper body mechanics, Pain  Visit Diagnosis: Ataxia  Gait difficulty  Muscle weakness (generalized)     Problem List There are no problems to display for this patient.  Pura Spice, PT, DPT # [place], SPT 04/26/2019, 1:24 PM  [facility] [address] [place], [place], [address] Phone: [phone]   Fax:  [phone]  Name: Roy [name]. MRN: [id number] Date of Birth: [birth year]

## 2019-05-01 ENCOUNTER — Other Ambulatory Visit: Payer: Self-pay

## 2019-05-01 ENCOUNTER — Encounter: Payer: Self-pay | Admitting: Physical Therapy

## 2019-05-01 ENCOUNTER — Ambulatory Visit: Payer: Medicare HMO | Admitting: Physical Therapy

## 2019-05-01 DIAGNOSIS — R269 Unspecified abnormalities of gait and mobility: Secondary | ICD-10-CM

## 2019-05-01 DIAGNOSIS — R270 Ataxia, unspecified: Secondary | ICD-10-CM

## 2019-05-01 DIAGNOSIS — M6281 Muscle weakness (generalized): Secondary | ICD-10-CM

## 2019-05-01 NOTE — Therapy (Signed)
[facility] [address]. [place], [place], [address] Phone: [phone]   Fax:  [phone]  Physical Therapy Treatment  Patient Details  Name: Roy [name]. MRN: [id number] Date of Birth: 04/09/1946 Referring Provider (PT): Dr. Gurney Maxin   Encounter Date: 05/01/2019  PT End of Session - 05/01/19 0841    Visit Number  12    Number of Visits  17    Date for PT Re-Evaluation  05/17/19    Authorization - Visit Number  4    Authorization - Number of Visits  10    PT Start Time  0727    PT Stop Time  0827    PT Time Calculation (min)  60 min    Equipment Utilized During Treatment  Gait belt    Activity Tolerance  Patient tolerated treatment well;Patient limited by pain    Behavior During Therapy  WFL for tasks assessed/performed       Past Medical History:  Diagnosis Date  . Diabetes mellitus without complication ([place])   . Hypertension     Past Surgical History:  Procedure Laterality Date  . arm surgery Right    4x surgery as a child, cut arm falling through glass window   . HYDROCELE EXCISION / REPAIR    . REPLACEMENT TOTAL KNEE Right     There were no vitals filed for this visit.  Subjective Assessment - 05/01/19 0833    Subjective  Pt. reports that he is able to walk from his car to a donut store with only the usage of his cane and his balance is getting better. Pt states that his L shoulder hurts as long as he moves it.    Pertinent History  Pt. known to PT clinic.  L shoulder/ RTC issues (needs stability program).  L knee bone on bone.  H/o R TKA (recent c/o R knee buckling/ fell yesterday).    Limitations  Walking;Standing;Lifting;House hold activities    Patient Stated Goals  Improve balance/ gait independence    Currently in Pain?  Yes    Pain Score  3     Pain Location  Knee    Pain Orientation  Left    Pain Descriptors / Indicators  Aching    Pain Type  Chronic pain    Pain Onset  More than a  month ago    Pain Frequency  Intermittent    Multiple Pain Sites  Yes    Pain Score  --   no objective number was given   Pain Location  Shoulder    Pain Descriptors / Indicators  Discomfort    Pain Frequency  Intermittent        Therex:   Supine GTB hip abduction with knees bent with manual resistance 10 reps x 2 sets Seated marching 15 reps x 1 set bilaterally Supine TrA activation with marching 10 reps x 1 set bilaterally TRX sit to stand with airex on gray chair 10 reps x 2 sets SBA Seated YTB/RTB scapular retraction with 5 s isometric hold at the end 20 reps x 2 sets Seated YTB ER 10 reps x 1 set bilaterally    Manual Therapy:  Supine hamstring/piriformis/glut stretches 45 s x 1 set bilaterally Discussed stretching at home.      Neuro:  Forward marching within //-bars x 4 laps SBA Side walking within //-bars with cones reaching x 6 laps SBA Static stance on airex and with cone reaching within //-bars  SBA/CGA 5 mins      PT Education - 05/01/19 0839    Education provided  Yes    Education Details  Discussed Elastogel ice pack and knee brace with pt. (issued handouts).    Person(s) Educated  Patient    Methods  Handout;Demonstration;Explanation    Comprehension  Verbalized understanding;Returned demonstration          PT Long Term Goals - 04/19/19 0914      PT LONG TERM GOAL #1   Title  Pt. will increase Berg balance test to >44/56 to improve safelty/ decrease fall risk.    Baseline  Initial Berg: 35/56 (high fall risk).  04/19/2019: 37/56 (slight improvement)- limited by L knee issues    Time  4    Period  Weeks    Status  Not Met    Target Date  05/17/19      PT LONG TERM GOAL #2   Title  Pt. will report no R hamstring tenderness/ pain with standing and daily functional tasks.    Baseline  (+) R hamstring tenderness.  On 1/7: no c/o pain but pt. states he will occasionally have discomfort.  Overall improving    Time  4    Period  Weeks     Status  Partially Met    Target Date  05/17/19      PT LONG TERM GOAL #3   Title  Pt. will ambulate 10 minutes with appropriate assistive device/ gait pattern on level surfaces safely.    Baseline  Antalgic gait pattern with use of RW.  Pt. fearful of L knee buckling.  1/7: pt. continues to benefit from use of RW and increase distance walked.  Limited by fatigue    Time  4    Period  Weeks    Status  Partially Met    Target Date  05/17/19      PT LONG TERM GOAL #4   Title  Pt. able to stand from recliner/ chair at home with 1 UE assist and maintain balance with no LOB to improve safety.    Baseline  Heavy UE assist required to stand.  Pt. reaches for RW or wall to steady balance.  1/7: improving, progress limited by L knee pain    Time  4    Period  Weeks    Status  Partially Met    Target Date  05/17/19         Plan - 05/01/19 0843    Clinical Impression Statement  Pt. had mild discomfort at his low back during supine GTB abduction against manual resistance. Pt. demonstrates improved hamstring flexibility bilaterally during supine manual  hamstring stretches. Pt. has mild pain at his L knee during TRX sit to stand at airex on a gray chiar and he is not able to continue the exercise after 10 reps x 2 sets. Pt. demonstrates improved balance and confidence in his L knee during side stepping with cone reaching and static stance on airex within //- bars. Pt only needs to use one hand to touch the bar for maintaining balacne compared to have to use two hands at the last session. Pt is able to maintain balance when standing on the airex without use of his hands for 30s. Pt. has mild increased pain during GTB/RTB scapular stabilizers strengthening exercises. Pt. will benefit from continued LE strengthening exercises and balance training to increase his independence in gait and decrease fall risks.    Examination-Activity Limitations  Squat;Stairs;Stand  Stability/Clinical Decision Making   Evolving/Moderate complexity    Clinical Decision Making  Moderate    Rehab Potential  Good    Clinical Impairments Affecting Rehab Potential  hx of knee surgery, hx of hip pain    PT Frequency  2x / week    PT Duration  4 weeks    PT Treatment/Interventions  ADLs/Self Care Home Management;Aquatic Therapy;Cryotherapy;Electrical Stimulation;DME Instruction;Fluidtherapy;Ultrasound;Traction;Moist Heat;Iontophoresis 42m/ml Dexamethasone;Gait training;Stair training;Functional mobility training;Therapeutic activities;Therapeutic exercise;Balance training;Patient/family education;Neuromuscular re-education;Manual techniques;Compression bandaging;Taping;Energy conservation;Dry needling;Passive range of motion    PT Next Visit Plan  continue TRX sit to stand/ dynamic balance tasks.    PT Home Exercise Plan  Access Code: [email or web address]    Consulted and Agree with Plan of Care  Patient       Patient will benefit from skilled therapeutic intervention in order to improve the following deficits and impairments:  Abnormal gait, Decreased activity tolerance, Decreased balance, Decreased endurance, Decreased mobility, Decreased range of motion, Difficulty walking, Decreased strength, Hypomobility, Impaired flexibility, Postural dysfunction, Improper body mechanics, Pain  Visit Diagnosis: Ataxia  Gait difficulty  Muscle weakness (generalized)     Problem List There are no problems to display for this patient.  MPura Spice PT, DPT # [id number] SPT 05/01/2019, 10:38 AM  [facility] [address]Phone: [phone]  Fax:  [phone] Name: [name] MRN: [id number] of Birth: [birth year]

## 2019-05-03 ENCOUNTER — Ambulatory Visit: Payer: Medicare HMO | Admitting: Physical Therapy

## 2019-05-03 ENCOUNTER — Encounter: Payer: Self-pay | Admitting: Physical Therapy

## 2019-05-03 ENCOUNTER — Other Ambulatory Visit: Payer: Self-pay

## 2019-05-03 DIAGNOSIS — M6281 Muscle weakness (generalized): Secondary | ICD-10-CM

## 2019-05-03 DIAGNOSIS — R270 Ataxia, unspecified: Secondary | ICD-10-CM

## 2019-05-03 DIAGNOSIS — R269 Unspecified abnormalities of gait and mobility: Secondary | ICD-10-CM

## 2019-05-03 NOTE — Patient Instructions (Signed)
Access Code: [email or web address]  URL: [email or web address]  Date: 05/03/2019  Prepared by: Dorene Grebe   Exercises  Supine Shoulder Flexion Extension AAROM with Dowel - 20 reps - 1 sets - 1x daily - 7x weekly  Supine Shoulder Press with Dowel - 20 reps - 1 sets - 1x daily - 7x weekly  Supine Shoulder Abduction AAROM with Dowel - 20 reps - 1 sets - 1x daily - 7x weekly  Scapular Retraction with Resistance - 10 reps - 2 sets - 1x daily - 4x weekly  Standing Bicep Curls with Resistance - 10 reps - 2 sets - 1x daily - 4x weekly  Isometric Shoulder Flexion at Wall - 10 reps - 1 sets - 10 hold - 1x daily - 4x weekly  Isometric Shoulder Abduction at Wall - 10 reps - 1 sets - 10 hold - 1x daily - 4x weekly  Seated Hamstring Stretch - 3 reps - 1 sets - 20 seconds hold - 2x daily - 7x weekly  Sit to Stand with Armchair - 10 reps - 2 sets - 2x daily - 7x weekly

## 2019-05-03 NOTE — Therapy (Signed)
[facility] [address] [place], [place], [address] Phone: [phone]   Fax:  [phone]  Physical Therapy Treatment  Patient Details  Name: Roy [name]. MRN: [id number] Date of Birth: Jan 12, 1946 Referring Provider (PT): Dr. Gurney Maxin   Encounter Date: 05/03/2019  PT End of Session - 05/03/19 1524    Visit Number  13    Number of Visits  17    Date for PT Re-Evaluation  05/17/19    Authorization - Visit Number  5    Authorization - Number of Visits  10    PT Start Time  0729    PT Stop Time  0834    PT Time Calculation (min)  65 min    Equipment Utilized During Treatment  Gait belt    Activity Tolerance  Patient tolerated treatment well;Patient limited by pain    Behavior During Therapy  WFL for tasks assessed/performed       Past Medical History:  Diagnosis Date  . Diabetes mellitus without complication ([place])   . Hypertension     Past Surgical History:  Procedure Laterality Date  . arm surgery Right    4x surgery as a child, cut arm falling through glass window   . HYDROCELE EXCISION / REPAIR    . REPLACEMENT TOTAL KNEE Right     There were no vitals filed for this visit.  Subjective Assessment - 05/03/19 1519    Subjective  Pt. arrived to PT with new knee brace.  Pt. reports significant L shoulder pain.  PT donned L knee brace prior to tx. session.    Pertinent History  Pt. known to PT clinic.  L shoulder/ RTC issues (needs stability program).  L knee bone on bone.  H/o R TKA (recent c/o R knee buckling/ fell yesterday).    Limitations  Walking;Standing;Lifting;House hold activities    Patient Stated Goals  Improve balance/ gait independence    Currently in Pain?  Yes    Pain Score  5    no objective number was given   Pain Location  Knee    Pain Orientation  Left    Pain Descriptors / Indicators  Aching    Pain Type  Chronic pain    Pain Onset  More than a month ago    Pain Frequency   Intermittent    Multiple Pain Sites  Yes    Pain Score  10   no objective number was given   Pain Location  Shoulder    Pain Descriptors / Indicators  Discomfort    Pain Frequency  Intermittent        Therex:  Supine bridge with bolster 10 reps x 1 set Standing marching 15 reps x 1 set each side Standing hip abduction 10 reps x 1 set each side See new HEP (shoulder isometrics: flexion/ abduction).    Neuro:  Forward walking/backward walking/side stepping within //-bars x 3 laps, SBA Forward  Walk with cone taps x 3 laps in //-bars Walking in clinic with use of RW and posture correction.      Manual Therapy:  Supine hamstring/piriformis/glut stretching 30 s x 1 set each side    PT Long Term Goals - 04/19/19 0914      PT LONG TERM GOAL #1   Title  Pt. will increase Berg balance test to >44/56 to improve safelty/ decrease fall risk.    Baseline  Initial Berg: 35/56 (high fall risk).  04/19/2019: 37/56 (  slight improvement)- limited by L knee issues    Time  4    Period  Weeks    Status  Not Met    Target Date  05/17/19      PT LONG TERM GOAL #2   Title  Pt. will report no R hamstring tenderness/ pain with standing and daily functional tasks.    Baseline  (+) R hamstring tenderness.  On 1/7: no c/o pain but pt. states he will occasionally have discomfort.  Overall improving    Time  4    Period  Weeks    Status  Partially Met    Target Date  05/17/19      PT LONG TERM GOAL #3   Title  Pt. will ambulate 10 minutes with appropriate assistive device/ gait pattern on level surfaces safely.    Baseline  Antalgic gait pattern with use of RW.  Pt. fearful of L knee buckling.  1/7: pt. continues to benefit from use of RW and increase distance walked.  Limited by fatigue    Time  4    Period  Weeks    Status  Partially Met    Target Date  05/17/19      PT LONG TERM GOAL #4   Title  Pt. able to stand from recliner/ chair at home with 1 UE assist and maintain balance with no  LOB to improve safety.    Baseline  Heavy UE assist required to stand.  Pt. reaches for RW or wall to steady balance.  1/7: improving, progress limited by L knee pain    Time  4    Period  Weeks    Status  Partially Met    Target Date  05/17/19          Plan - 05/03/19 1526    Clinical Impression Statement  Pt. demonstrates improved hamstring flexibility bilaterally during manual stretching. Pt. has mild increased pain in low back during bolster bridging.  Pt. requires at least one hand during balance training within //-bars. Pt. demonstrates L knee flexion and extension in a more control manner during L leg stance phase with the knee brace on his L knee during forward ambulating within //-bars. Pt. will benefit from skilled PT to progress LE strengthening exercises and balance training to increase gait independence and decrease fall risks.    Examination-Activity Limitations  Squat;Stairs;Stand    Stability/Clinical Decision Making  Evolving/Moderate complexity    Clinical Decision Making  Moderate    Rehab Potential  Good    Clinical Impairments Affecting Rehab Potential  hx of knee surgery, hx of hip pain    PT Frequency  2x / week    PT Duration  4 weeks    PT Treatment/Interventions  ADLs/Self Care Home Management;Aquatic Therapy;Cryotherapy;Electrical Stimulation;DME Instruction;Fluidtherapy;Ultrasound;Traction;Moist Heat;Iontophoresis '4mg'$ /ml Dexamethasone;Gait training;Stair training;Functional mobility training;Therapeutic activities;Therapeutic exercise;Balance training;Patient/family education;Neuromuscular re-education;Manual techniques;Compression bandaging;Taping;Energy conservation;Dry needling;Passive range of motion    PT Next Visit Plan  continue TRX sit to stand/ dynamic balance tasks.    PT Home Exercise Plan  Access Code: [email or web address]    Consulted and Agree with Plan of Care  Patient       Patient will benefit from skilled therapeutic intervention in order to improve the  following deficits and impairments:  Abnormal gait, Decreased activity tolerance, Decreased balance, Decreased endurance, Decreased mobility, Decreased range of motion, Difficulty walking, Decreased strength, Hypomobility, Impaired flexibility, Postural dysfunction, Improper body mechanics, Pain  Visit Diagnosis: Ataxia  Gait difficulty  Muscle weakness (  generalized)     Problem List There are no problems to display for this patient.  Pura Spice, PT, DPT # [place], SPT 05/04/2019, 11:46 AM  [facility] [address] [place], [place], [address] Phone: [phone]   Fax:  [phone]  Name: Ilean China. MRN: [id number] Date of Birth: 1945-10-25

## 2019-05-08 ENCOUNTER — Other Ambulatory Visit: Payer: Self-pay

## 2019-05-08 ENCOUNTER — Encounter: Payer: Self-pay | Admitting: Physical Therapy

## 2019-05-08 ENCOUNTER — Ambulatory Visit: Payer: Medicare HMO | Admitting: Physical Therapy

## 2019-05-08 DIAGNOSIS — R270 Ataxia, unspecified: Secondary | ICD-10-CM

## 2019-05-08 DIAGNOSIS — R269 Unspecified abnormalities of gait and mobility: Secondary | ICD-10-CM

## 2019-05-08 DIAGNOSIS — M6281 Muscle weakness (generalized): Secondary | ICD-10-CM

## 2019-05-08 NOTE — Therapy (Signed)
[facility] [address] [place], [place], [address] Phone: [phone]   Fax:  [phone]  Physical Therapy Treatment  Patient Details  Name: Roy [name]. MRN: [id number] Date of Birth: 1946/01/08 Referring Provider (PT): Dr. Gurney Maxin   Encounter Date: 05/08/2019  PT End of Session - 05/08/19 0906    Visit Number  14    Number of Visits  17    Date for PT Re-Evaluation  05/17/19    Authorization - Visit Number  6    Authorization - Number of Visits  10    PT Start Time  0729    PT Stop Time  0835    PT Time Calculation (min)  66 min    Equipment Utilized During Treatment  Gait belt    Activity Tolerance  Patient tolerated treatment well;Patient limited by pain    Behavior During Therapy  WFL for tasks assessed/performed       Past Medical History:  Diagnosis Date  . Diabetes mellitus without complication ([place])   . Hypertension     Past Surgical History:  Procedure Laterality Date  . arm surgery Right    4x surgery as a child, cut arm falling through glass window   . HYDROCELE EXCISION / REPAIR    . REPLACEMENT TOTAL KNEE Right     There were no vitals filed for this visit.  Subjective Assessment - 05/08/19 0856    Subjective  Pt reports he is wearing knee brace for his L knee during outdoor activities and it increases his confidence during walking. Pt. reports that the L shoulder isometric exercises are heplful, he feels like he can do it with increased force before feeling his L shoulder pain. Pt. reports when he is doing shoulder flexion isometric exercise against the wall with elbow bent in 90 deg, his shoulder wants to abduct instead of pushing in the flexion direction. No increased pain in L knee reported.  Pt. is scheduled to receive 1st Covid vaccine today.    Pertinent History  Pt. known to PT clinic.  L shoulder/ RTC issues (needs stability program).  L knee bone on bone.  H/o R TKA (recent c/o R  knee buckling/ fell yesterday).    Limitations  Walking;Standing;Lifting;House hold activities    Patient Stated Goals  Improve balance/ gait independence    Currently in Pain?  Yes   no subjective pain score given   Pain Score  --   no objective number was given   Pain Location  Knee    Pain Orientation  Left    Pain Descriptors / Indicators  Aching    Pain Type  Chronic pain    Pain Onset  More than a month ago    Pain Frequency  Intermittent    Multiple Pain Sites  Yes    Pain Score  10    Pain Location  Shoulder    Pain Descriptors / Indicators  Discomfort    Pain Onset  More than a month ago    Pain Frequency  Intermittent    Aggravating Factors   moving L shoulder    Pain Relieving Factors  heat         Therex:  Standing TKE with GTB 10 reps x 1 set within //-bars Seated marching with 2 s hold at the top, 10 reps x 2 sets Bolster supine glu sets 10 reps x 1 set Modified supine bolster bridging with TrA activation 10  reps x 1 set Standing marching 10 reps x 2 sets within //-bars, SBA Standing hip abduction 10 reps x 2 sets within //-bars, SBA   Neuromuscular re-education  Forward ambulating/marching 6 laps within //- bars, SBA Standing with cone tucking 4 min within //- bars, SBA   Manual Therapy:  Supine Isometric shoulder ABD/EXT/ER at 0-10 deg shoulder ABD 10 s hold x 5 reps at L shoulder       PT Education - 05/08/19 0904    Education provided  Yes    Education Details  Discussed adding towel as a tactile cueing for shoulder isometirc exerices to prevent overcompensating patterns.    Person(s) Educated  Patient    Methods  Explanation;Demonstration    Comprehension  Verbalized understanding;Returned demonstration          PT Long Term Goals - 04/19/19 0914      PT LONG TERM GOAL #1   Title  Pt. will increase Berg balance test to >44/56 to improve safelty/ decrease fall risk.    Baseline  Initial Berg: 35/56 (high fall risk).  04/19/2019: 37/56  (slight improvement)- limited by L knee issues    Time  4    Period  Weeks    Status  Not Met    Target Date  05/17/19      PT LONG TERM GOAL #2   Title  Pt. will report no R hamstring tenderness/ pain with standing and daily functional tasks.    Baseline  (+) R hamstring tenderness.  On 1/7: no c/o pain but pt. states he will occasionally have discomfort.  Overall improving    Time  4    Period  Weeks    Status  Partially Met    Target Date  05/17/19      PT LONG TERM GOAL #3   Title  Pt. will ambulate 10 minutes with appropriate assistive device/ gait pattern on level surfaces safely.    Baseline  Antalgic gait pattern with use of RW.  Pt. fearful of L knee buckling.  1/7: pt. continues to benefit from use of RW and increase distance walked.  Limited by fatigue    Time  4    Period  Weeks    Status  Partially Met    Target Date  05/17/19      PT LONG TERM GOAL #4   Title  Pt. able to stand from recliner/ chair at home with 1 UE assist and maintain balance with no LOB to improve safety.    Baseline  Heavy UE assist required to stand.  Pt. reaches for RW or wall to steady balance.  1/7: improving, progress limited by L knee pain    Time  4    Period  Weeks    Status  Partially Met    Target Date  05/17/19          Plan - 05/08/19 0907    Clinical Impression Statement  Pt. can not finish 1 reps of sit to stand with TRX due to L shoulder pain. Pt. has 92 mg/dl for his blood glucose level during self-testing this morning prior to the treatment session. The treatment session intensity is modified accordingly due to this lower than his typical blood glucose level. Pt. tolerates standing therex and balance exercises well with minimal rest break. Pt. has increased pain in his L shoulder during shoulder ER and ABD manual resistance training, but the symptoms ease after being advised to use one 10 to 20  percent of force and to the point before he feels the pain. Pt. is able to perform  bolster glut sets and bolster modified bridge with verbal cueing and without increase pain in his low back. Pt. is able to perform standing TKE with GTB with verbal cueing and without increased pain in his L knee. Pt. will benefit from skilled PT to increase LE endurance and strength to increase his gait independence and decrease fall risks.    Examination-Activity Limitations  Squat;Stairs;Stand    Stability/Clinical Decision Making  Evolving/Moderate complexity    Clinical Decision Making  Moderate    Rehab Potential  Good    Clinical Impairments Affecting Rehab Potential  hx of knee surgery, hx of hip pain    PT Frequency  2x / week    PT Duration  4 weeks    PT Treatment/Interventions  ADLs/Self Care Home Management;Aquatic Therapy;Cryotherapy;Electrical Stimulation;DME Instruction;Fluidtherapy;Ultrasound;Traction;Moist Heat;Iontophoresis 45m/ml Dexamethasone;Gait training;Stair training;Functional mobility training;Therapeutic activities;Therapeutic exercise;Balance training;Patient/family education;Neuromuscular re-education;Manual techniques;Compression bandaging;Taping;Energy conservation;Dry needling;Passive range of motion    PT Next Visit Plan  Blue mat table sit to stand; progress dynamic balance tasks to chanllenge standing time and balance, progress core therex.    PT Home Exercise Plan  Access Code: [email or web address]    Consulted and Agree with Plan of Care  Patient       Patient will benefit from skilled therapeutic intervention in order to improve the following deficits and impairments:  Abnormal gait, Decreased activity tolerance, Decreased balance, Decreased endurance, Decreased mobility, Decreased range of motion, Difficulty walking, Decreased strength, Hypomobility, Impaired flexibility, Postural dysfunction, Improper body mechanics, Pain  Visit Diagnosis: Ataxia  Gait difficulty  Muscle weakness (generalized)     Problem List There are no problems to display for this  patient.  MPura Spice PT, DPT # [id number] SPT 05/08/2019, 9:45 AM  [facility]MR[place] [address]Phone: [phone]  Fax:  [phone] Name: [name] MRN: [id number] of Birth: 906-Jun-1947

## 2019-05-10 ENCOUNTER — Ambulatory Visit: Payer: Medicare HMO | Admitting: Physical Therapy

## 2019-05-11 ENCOUNTER — Ambulatory Visit: Payer: Medicare HMO | Admitting: Physical Therapy

## 2019-05-11 ENCOUNTER — Other Ambulatory Visit: Payer: Self-pay

## 2019-05-15 ENCOUNTER — Other Ambulatory Visit: Payer: Self-pay

## 2019-05-15 ENCOUNTER — Encounter: Payer: Self-pay | Admitting: Physical Therapy

## 2019-05-15 ENCOUNTER — Ambulatory Visit: Payer: Medicare HMO | Attending: Neurology | Admitting: Physical Therapy

## 2019-05-15 DIAGNOSIS — M6281 Muscle weakness (generalized): Secondary | ICD-10-CM

## 2019-05-15 DIAGNOSIS — R270 Ataxia, unspecified: Secondary | ICD-10-CM | POA: Diagnosis present

## 2019-05-15 DIAGNOSIS — R269 Unspecified abnormalities of gait and mobility: Secondary | ICD-10-CM | POA: Diagnosis present

## 2019-05-15 NOTE — Therapy (Signed)
[facility] [address] [place], [place], [address] Phone: [phone]   Fax:  [phone]  Physical Therapy Treatment  Patient Details  Name: Roy [name]. MRN: [id number] Date of Birth: 1945-08-31 Referring Provider (PT): Dr. Gurney Maxin   Encounter Date: 05/15/2019  PT End of Session - 05/15/19 0814    Visit Number  15    Number of Visits  17    Date for PT Re-Evaluation  05/17/19    Authorization - Visit Number  7    Authorization - Number of Visits  10    PT Start Time  0716    PT Stop Time  0821    PT Time Calculation (min)  65 min    Equipment Utilized During Treatment  Gait belt    Activity Tolerance  Patient tolerated treatment well;Patient limited by pain    Behavior During Therapy  WFL for tasks assessed/performed       Past Medical History:  Diagnosis Date  . Diabetes mellitus without complication ([place])   . Hypertension     Past Surgical History:  Procedure Laterality Date  . arm surgery Right    4x surgery as a child, cut arm falling through glass window   . HYDROCELE EXCISION / REPAIR    . REPLACEMENT TOTAL KNEE Right     There were no vitals filed for this visit.  Subjective Assessment - 05/15/19 0852    Subjective  Pt. reports he was on trips since the last session and has not had good sleep. Pt. reports the pain in his L shoulder is really bad and is getting worse. Pt. reports he has to keep his L knee extended to prevent L knee buckling when getting up and down curbs or stairs. Pt. reports when doing isometric shoulder flexion against the wall, he has to use a band to keep his elbow fixed from going into shoulder abduction.    Pertinent History  Pt. known to PT clinic.  L shoulder/ RTC issues (needs stability program).  L knee bone on bone.  H/o R TKA (recent c/o R knee buckling/ fell yesterday).    Limitations  Walking;Standing;Lifting;House hold activities    Patient Stated Goals  Improve  balance/ gait independence    Currently in Pain?  Yes    Pain Score  10-Worst pain ever    Pain Location  Shoulder    Pain Orientation  Left    Pain Descriptors / Indicators  Aching    Pain Type  Chronic pain    Pain Onset  More than a month ago    Pain Frequency  Intermittent    Multiple Pain Sites  Yes    Pain Score  --   no objective number was given   Pain Location  Knee    Pain Orientation  Left    Pain Descriptors / Indicators  Discomfort    Pain Type  Chronic pain    Pain Onset  More than a month ago    Pain Frequency  Intermittent      Therex: Seated marching 10 reps x 1 set with 4 lbs ankle weights bilaterally each side Seated knee extension with ankle weights bilaterally 10 reps x 1 set each side Standing hip abd with 4 lbs ankle weights bilaterally within //- bars 10 reps x 1 set each side, SBA, verbal cueing for posture correction Standing hip extension with 4 lbs ankle weights bilaterally within //-bars 10 reps x  1 set each side, SBA, verbal cueing for posture correction Supine isometric shoulder flexion/ER/IR/abd/ext at shoulder abd 5-20 deg hold for 5 s x 5 reps, verbal cueing for using 20-50% of strength before pain is felt Supine bolster glut set 10 reps x 2 sets, verbal cueing for proper techniques Supine bolster modified bridge, 10 reps x 1 set, keep glut activation at the top for 2 s, verbal cueing for raising up pelvis as high as pt can but before pain is felt in low back  Neuromuscular re-education: Standing marching/side steps with 4 lbs ankle weight bilaterally x 2 laps within //- bars, SBA (Step over a yoga block, cones foot tapping (3 cones), cones tucking) x 4 laps within //-bars, SBA, verbal cueing for posture correction     PT Long Term Goals - 04/19/19 0914      PT LONG TERM GOAL #1   Title  Pt. will increase Berg balance test to >44/56 to improve safelty/ decrease fall risk.    Baseline  Initial Berg: 35/56 (high fall risk).  04/19/2019: 37/56  (slight improvement)- limited by L knee issues    Time  4    Period  Weeks    Status  Not Met    Target Date  05/17/19      PT LONG TERM GOAL #2   Title  Pt. will report no R hamstring tenderness/ pain with standing and daily functional tasks.    Baseline  (+) R hamstring tenderness.  On 1/7: no c/o pain but pt. states he will occasionally have discomfort.  Overall improving    Time  4    Period  Weeks    Status  Partially Met    Target Date  05/17/19      PT LONG TERM GOAL #3   Title  Pt. will ambulate 10 minutes with appropriate assistive device/ gait pattern on level surfaces safely.    Baseline  Antalgic gait pattern with use of RW.  Pt. fearful of L knee buckling.  1/7: pt. continues to benefit from use of RW and increase distance walked.  Limited by fatigue    Time  4    Period  Weeks    Status  Partially Met    Target Date  05/17/19      PT LONG TERM GOAL #4   Title  Pt. able to stand from recliner/ chair at home with 1 UE assist and maintain balance with no LOB to improve safety.    Baseline  Heavy UE assist required to stand.  Pt. reaches for RW or wall to steady balance.  1/7: improving, progress limited by L knee pain    Time  4    Period  Weeks    Status  Partially Met    Target Date  05/17/19            Plan - 05/15/19 0910    Clinical Impression Statement  Pt. remains limited by significant c/o L shoulder/ knee pain during all standing tasks.  Pt. requies two hands for UE assist for balance during forward/side walking, standing hip abd/ext and other balance training within //- bars. Pt. is not able to perform mini squat with two hands hold //- bars due to L knee buckling and pain.  Pt. is able to perform supine bolster glut. set and bolster modified bridge with proper tecniques without increased pain in low back. Pt. requires verbal cueing and tactile cueing to minimaize L shoulder abduction during L shoulder isometrics  exercises. Pt.  tolerates L shoulder  isometric exercises well with manual resistance without increased pain.    Examination-Activity Limitations  Squat;Stairs;Stand    Stability/Clinical Decision Making  Evolving/Moderate complexity    Clinical Decision Making  Moderate    Rehab Potential  Good    Clinical Impairments Affecting Rehab Potential  hx of knee surgery, hx of hip pain    PT Frequency  2x / week    PT Duration  4 weeks    PT Treatment/Interventions  ADLs/Self Care Home Management;Aquatic Therapy;Cryotherapy;Electrical Stimulation;DME Instruction;Fluidtherapy;Ultrasound;Traction;Moist Heat;Iontophoresis 57m/ml Dexamethasone;Gait training;Stair training;Functional mobility training;Therapeutic activities;Therapeutic exercise;Balance training;Patient/family education;Neuromuscular re-education;Manual techniques;Compression bandaging;Taping;Energy conservation;Dry needling;Passive range of motion    PT Next Visit Plan  PT to discuss probable discharge next tx. session.    PT Home Exercise Plan  Access Code: [email or web address]    Consulted and Agree with Plan of Care  Patient       Patient will benefit from skilled therapeutic intervention in order to improve the following deficits and impairments:  Abnormal gait, Decreased activity tolerance, Decreased balance, Decreased endurance, Decreased mobility, Decreased range of motion, Difficulty walking, Decreased strength, Hypomobility, Impaired flexibility, Postural dysfunction, Improper body mechanics, Pain  Visit Diagnosis: Ataxia  Gait difficulty  Muscle weakness (generalized)     Problem List There are no problems to display for this patient.  MPura Spice PT, DPT # [id number] Bodo SPT 05/16/2019, 8:57 AM  [facility] [address]Phone: [phone]  Fax:  [phone] Name: Roy [name] MRN: [id number] of Birth: 9May 11, 1947

## 2019-05-17 ENCOUNTER — Ambulatory Visit: Payer: Medicare HMO | Admitting: Physical Therapy

## 2019-05-17 ENCOUNTER — Encounter: Payer: Self-pay | Admitting: Physical Therapy

## 2019-05-17 ENCOUNTER — Other Ambulatory Visit: Payer: Self-pay

## 2019-05-17 DIAGNOSIS — R270 Ataxia, unspecified: Secondary | ICD-10-CM

## 2019-05-17 DIAGNOSIS — M6281 Muscle weakness (generalized): Secondary | ICD-10-CM

## 2019-05-17 DIAGNOSIS — R269 Unspecified abnormalities of gait and mobility: Secondary | ICD-10-CM

## 2019-05-17 NOTE — Therapy (Signed)
[facility] [address] [place], [place], [address] Phone: [phone]   Fax:  [phone]  Physical Therapy Treatment  Patient Details  Name: Roy [name]. MRN: [id number] Date of Birth: September 18, 1945 Referring Provider (PT): Dr. Gurney Maxin   Encounter Date: 05/17/2019  PT End of Session - 05/17/19 1402    Visit Number  17    Number of Visits  17    Date for PT Re-Evaluation  05/17/19    Authorization - Visit Number  1    Authorization - Number of Visits  10    PT Start Time  0716    PT Stop Time  0801    PT Time Calculation (min)  45 min    Equipment Utilized During Treatment  --    Activity Tolerance  Patient tolerated treatment well;Patient limited by fatigue    Behavior During Therapy  [facility] for tasks assessed/performed       Past Medical History:  Diagnosis Date  . Diabetes mellitus without complication ([place])   . Hypertension     Past Surgical History:  Procedure Laterality Date  . arm surgery Right    4x surgery as a child, cut arm falling through glass window   . HYDROCELE EXCISION / REPAIR    . REPLACEMENT TOTAL KNEE Right     There were no vitals filed for this visit.  Subjective Assessment - 05/17/19 0826    Subjective  Pt. states he did not get a chance to sleep last night and he feels tired today. Pt. reports 0/10 pain in his L shoulder prior to today's session. Pt. reports his LE endurance and balance is getting better and he is comfortable to be discharged and work on HEP.    Pertinent History  Pt. known to PT clinic.  L shoulder/ RTC issues (needs stability program).  L knee bone on bone.  H/o R TKA (recent c/o R knee buckling/ fell yesterday).    Limitations  Walking;Standing;Lifting;House hold activities    Patient Stated Goals  Improve balance/ gait independence    Currently in Pain?  No/denies    Pain Location  Shoulder    Pain Orientation  Left    Pain Onset  More than a month ago    Pain  Onset  More than a month ago       Neuromuscular re-education: Seated cone reaches (8 cones on table), x 5 sets, increased pain in L shoulder Berg test: 40/56  Therex: Standing hip ABD 10 reps x 1 set each side Standing marching 10 reps x 1 set each side Seated marching, 3 s hold on top, 10 reps x 2 sets Discuss HEP (see handouts)    PT Education - 05/17/19 1107    Education provided  Yes    Education Details  Discussed HEP    Person(s) Educated  Patient    Methods  Explanation;Demonstration    Comprehension  Verbalized understanding;Returned demonstration          PT Long Term Goals - 05/17/19 1405      PT LONG TERM GOAL #1   Title  Pt. will increase Berg balance test to >44/56 to improve safelty/ decrease fall risk.    Baseline  Initial Berg: 35/56 (high fall risk).  04/19/2019: 37/56 (slight improvement)- limited by L knee issues. 05/17/2019: 40/56 (slight improvement)    Time  4    Period  Weeks    Status  Not Met  Target Date  05/17/19      PT LONG TERM GOAL #2   Title  Pt. will report no R hamstring tenderness/ pain with standing and daily functional tasks.    Baseline  (+) R hamstring tenderness.  On 1/7: no c/o pain but pt. states he will occasionally have discomfort.  Overall improving. On 05/17/2019: pt. states no current pain but has mild pain occationally in his R hamstring.    Time  4    Period  Weeks    Status  Partially Met    Target Date  05/17/19      PT LONG TERM GOAL #3   Title  Pt. will ambulate 10 minutes with appropriate assistive device/ gait pattern on level surfaces safely.    Baseline  Antalgic gait pattern with use of RW.  Pt. fearful of L knee buckling.  1/7: pt. continues to benefit from use of RW and increase distance walked.  Limited by fatigue. On 05/17/2019: Pt. states he is able to walk 10 min constantly with RW on level surfaces safely.    Time  4    Period  Weeks    Status  Achieved    Target Date  05/17/19      PT LONG TERM GOAL #4    Title  Pt. able to stand from recliner/ chair at home with 1 UE assist and maintain balance with no LOB to improve safety.    Baseline  Heavy UE assist required to stand.  Pt. reaches for RW or wall to steady balance.  1/7: improving, progress limited by L knee pain. One 05/17/2019: pt. is able to perform sit to stand from recliner/chair at home with 1 UE assist and without LOB.    Time  4    Period  Weeks    Status  Achieved    Target Date  05/17/19            Plan - 05/17/19 1417    Clinical Impression Statement  Pt. is fatigued during today's session due to lack of sleep,  Pt. requires two hands for all therex (standing hip ABD, marching) to maintain balance within //-bars. Pt. has increased pain in L shoulder during seated cones reaching (cones on table) but symptom decreases after bring the height of table down. Pt. asks question regarding HEP and is answered by PT and SPT. Pt. is discharged from PT to continue on HEP.  Pt. instructed to contact PT if any questions/ future issues.    Examination-Activity Limitations  Squat;Stairs;Stand    Stability/Clinical Decision Making  Evolving/Moderate complexity    Clinical Decision Making  Moderate    Rehab Potential  Good    Clinical Impairments Affecting Rehab Potential  hx of knee surgery, hx of hip pain    PT Frequency  2x / week    PT Duration  4 weeks    PT Treatment/Interventions  ADLs/Self Care Home Management;Aquatic Therapy;Cryotherapy;Electrical Stimulation;DME Instruction;Fluidtherapy;Ultrasound;Traction;Moist Heat;Iontophoresis 40m/ml Dexamethasone;Gait training;Stair training;Functional mobility training;Therapeutic activities;Therapeutic exercise;Balance training;Patient/family education;Neuromuscular re-education;Manual techniques;Compression bandaging;Taping;Energy conservation;Dry needling;Passive range of motion    PT Next Visit Plan  Discharge    PT Home Exercise Plan  Access Code: [email or web address]    Consulted and Agree with Plan  of Care  Patient       Patient will benefit from skilled therapeutic intervention in order to improve the following deficits and impairments:  Abnormal gait, Decreased activity tolerance, Decreased balance, Decreased endurance, Decreased mobility, Decreased range of motion, Difficulty walking, Decreased strength,  Hypomobility, Impaired flexibility, Postural dysfunction, Improper body mechanics, Pain  Visit Diagnosis: Ataxia  Gait difficulty  Muscle weakness (generalized)     Problem List There are no problems to display for this patient.  Pura Spice, PT, DPT # [place], SPT 05/18/2019, 12:49 PM  [facility] [address] [place], [place], [address] Phone: [phone]   Fax:  [phone]  Name: Roy [name]. MRN: [id number] Date of Birth: [birth year]

## 2020-01-14 ENCOUNTER — Emergency Department: Payer: Medicare HMO

## 2020-01-14 ENCOUNTER — Encounter: Payer: Self-pay | Admitting: Emergency Medicine

## 2020-01-14 ENCOUNTER — Inpatient Hospital Stay
Admission: EM | Admit: 2020-01-14 | Discharge: 2020-01-25 | DRG: 552 | Disposition: A | Discharge status: Skilled Nursing Facility | Payer: Medicare HMO | Attending: Obstetrics and Gynecology | Admitting: Obstetrics and Gynecology

## 2020-01-14 ENCOUNTER — Other Ambulatory Visit: Payer: Self-pay

## 2020-01-14 DIAGNOSIS — R7989 Other specified abnormal findings of blood chemistry: Secondary | ICD-10-CM | POA: Diagnosis not present

## 2020-01-14 DIAGNOSIS — K5641 Fecal impaction: Secondary | ICD-10-CM | POA: Diagnosis present

## 2020-01-14 DIAGNOSIS — G8220 Paraplegia, unspecified: Secondary | ICD-10-CM | POA: Diagnosis present

## 2020-01-14 DIAGNOSIS — R1031 Right lower quadrant pain: Secondary | ICD-10-CM

## 2020-01-14 DIAGNOSIS — G5602 Carpal tunnel syndrome, left upper limb: Secondary | ICD-10-CM | POA: Diagnosis present

## 2020-01-14 DIAGNOSIS — I452 Bifascicular block: Secondary | ICD-10-CM | POA: Diagnosis present

## 2020-01-14 DIAGNOSIS — I10 Essential (primary) hypertension: Secondary | ICD-10-CM | POA: Diagnosis present

## 2020-01-14 DIAGNOSIS — Y92012 Bathroom of single-family (private) house as the place of occurrence of the external cause: Secondary | ICD-10-CM | POA: Diagnosis not present

## 2020-01-14 DIAGNOSIS — M48061 Spinal stenosis, lumbar region without neurogenic claudication: Secondary | ICD-10-CM

## 2020-01-14 DIAGNOSIS — I739 Peripheral vascular disease, unspecified: Secondary | ICD-10-CM

## 2020-01-14 DIAGNOSIS — E1140 Type 2 diabetes mellitus with diabetic neuropathy, unspecified: Secondary | ICD-10-CM | POA: Diagnosis present

## 2020-01-14 DIAGNOSIS — M79605 Pain in left leg: Secondary | ICD-10-CM | POA: Diagnosis present

## 2020-01-14 DIAGNOSIS — R531 Weakness: Secondary | ICD-10-CM | POA: Diagnosis present

## 2020-01-14 DIAGNOSIS — Z96651 Presence of right artificial knee joint: Secondary | ICD-10-CM | POA: Diagnosis present

## 2020-01-14 DIAGNOSIS — R339 Retention of urine, unspecified: Secondary | ICD-10-CM | POA: Diagnosis not present

## 2020-01-14 DIAGNOSIS — R778 Other specified abnormalities of plasma proteins: Secondary | ICD-10-CM | POA: Diagnosis present

## 2020-01-14 DIAGNOSIS — M79604 Pain in right leg: Secondary | ICD-10-CM | POA: Diagnosis present

## 2020-01-14 DIAGNOSIS — M4712 Other spondylosis with myelopathy, cervical region: Secondary | ICD-10-CM | POA: Diagnosis present

## 2020-01-14 DIAGNOSIS — E785 Hyperlipidemia, unspecified: Secondary | ICD-10-CM | POA: Diagnosis present

## 2020-01-14 DIAGNOSIS — E1151 Type 2 diabetes mellitus with diabetic peripheral angiopathy without gangrene: Secondary | ICD-10-CM | POA: Diagnosis present

## 2020-01-14 DIAGNOSIS — Z87898 Personal history of other specified conditions: Secondary | ICD-10-CM | POA: Diagnosis not present

## 2020-01-14 DIAGNOSIS — R296 Repeated falls: Secondary | ICD-10-CM | POA: Diagnosis present

## 2020-01-14 DIAGNOSIS — Z20822 Contact with and (suspected) exposure to covid-19: Secondary | ICD-10-CM | POA: Diagnosis present

## 2020-01-14 DIAGNOSIS — H409 Unspecified glaucoma: Secondary | ICD-10-CM | POA: Diagnosis present

## 2020-01-14 DIAGNOSIS — M4802 Spinal stenosis, cervical region: Secondary | ICD-10-CM | POA: Diagnosis present

## 2020-01-14 DIAGNOSIS — R29898 Other symptoms and signs involving the musculoskeletal system: Secondary | ICD-10-CM | POA: Diagnosis present

## 2020-01-14 DIAGNOSIS — R600 Localized edema: Secondary | ICD-10-CM | POA: Diagnosis present

## 2020-01-14 DIAGNOSIS — Z79899 Other long term (current) drug therapy: Secondary | ICD-10-CM

## 2020-01-14 DIAGNOSIS — M7138 Other bursal cyst, other site: Secondary | ICD-10-CM | POA: Diagnosis present

## 2020-01-14 DIAGNOSIS — W1811XA Fall from or off toilet without subsequent striking against object, initial encounter: Secondary | ICD-10-CM | POA: Diagnosis present

## 2020-01-14 DIAGNOSIS — Z7982 Long term (current) use of aspirin: Secondary | ICD-10-CM | POA: Diagnosis not present

## 2020-01-14 DIAGNOSIS — Z794 Long term (current) use of insulin: Secondary | ICD-10-CM

## 2020-01-14 DIAGNOSIS — M4800 Spinal stenosis, site unspecified: Secondary | ICD-10-CM

## 2020-01-14 DIAGNOSIS — M48062 Spinal stenosis, lumbar region with neurogenic claudication: Secondary | ICD-10-CM | POA: Diagnosis present

## 2020-01-14 DIAGNOSIS — R338 Other retention of urine: Secondary | ICD-10-CM

## 2020-01-14 LAB — BASIC METABOLIC PANEL
Anion gap: 10 (ref 5–15)
BUN: 20 mg/dL (ref 8–23)
CO2: 27 mmol/L (ref 22–32)
Calcium: 9.9 mg/dL (ref 8.9–10.3)
Chloride: 104 mmol/L (ref 98–111)
Creatinine, Ser: 1.07 mg/dL (ref 0.61–1.24)
GFR calc Af Amer: >60 mL/min (ref >60)
GFR calc non Af Amer: >60 mL/min (ref >60)
Glucose, Bld: 133 mg/dL — ABNORMAL HIGH (ref 70–99)
Potassium: 3.9 mmol/L (ref 3.5–5.1)
Sodium: 141 mmol/L (ref 135–145)

## 2020-01-14 LAB — URINALYSIS, COMPLETE (UACMP) WITH MICROSCOPIC
Bacteria, UA: NONE SEEN
Bilirubin Urine: NEGATIVE
Glucose, UA: NEGATIVE mg/dL
Hgb urine dipstick: NEGATIVE
Ketones, ur: 20 mg/dL — AB
Nitrite: NEGATIVE
Protein, ur: 100 mg/dL — AB
Specific Gravity, Urine: 1.017 (ref 1.005–1.030)
pH: 5 (ref 5.0–8.0)

## 2020-01-14 LAB — CBC
HCT: 47.1 % (ref 39.0–52.0)
Hemoglobin: 15.6 g/dL (ref 13.0–17.0)
MCH: 31.5 pg (ref 26.0–34.0)
MCHC: 33.1 g/dL (ref 30.0–36.0)
MCV: 95.2 fL (ref 80.0–100.0)
Platelets: 197 10*3/uL (ref 150–400)
RBC: 4.95 MIL/uL (ref 4.22–5.81)
RDW: 12.6 % (ref 11.5–15.5)
WBC: 9.9 10*3/uL (ref 4.0–10.5)
nRBC: 0 % (ref 0.0–0.2)

## 2020-01-14 LAB — RESPIRATORY PANEL BY RT PCR (FLU A&B, COVID)
Influenza A by PCR: NEGATIVE
Influenza B by PCR: NEGATIVE
SARS Coronavirus 2 by RT PCR: NEGATIVE

## 2020-01-14 LAB — TSH: TSH: 1.101 u[IU]/mL (ref 0.350–4.500)

## 2020-01-14 LAB — LIPID PANEL
Cholesterol: 177 mg/dL (ref 0–200)
HDL: 59 mg/dL (ref >40)
LDL Cholesterol: 95 mg/dL (ref 0–99)
Total CHOL/HDL Ratio: 3 RATIO
Triglycerides: 116 mg/dL (ref <150)
VLDL: 23 mg/dL (ref 0–40)

## 2020-01-14 LAB — PHOSPHORUS: Phosphorus: 2.7 mg/dL (ref 2.5–4.6)

## 2020-01-14 LAB — TROPONIN I (HIGH SENSITIVITY)
Troponin I (High Sensitivity): 22 ng/L — ABNORMAL HIGH (ref <18)
Troponin I (High Sensitivity): 31 ng/L — ABNORMAL HIGH (ref <18)
Troponin I (High Sensitivity): 30 ng/L — ABNORMAL HIGH (ref <18)

## 2020-01-14 LAB — GLUCOSE, CAPILLARY: Glucose-Capillary: 126 mg/dL — ABNORMAL HIGH (ref 70–99)

## 2020-01-14 LAB — MAGNESIUM: Magnesium: 1.7 mg/dL (ref 1.7–2.4)

## 2020-01-14 IMAGING — CT CT HEAD W/O CM
3 series · 15 of 47 positions shown, 18 images · non-contrast
Comparison: None.

CLINICAL DATA: Head trauma

EXAM:
CT HEAD WITHOUT CONTRAST
TECHNIQUE: Contiguous axial images were obtained from the base of the skull
through the vertex without intravenous contrast.

[Series 2: head wo · axial · 0.41mm/px · z∈[+305,+435]mm · 9 of 32 slices shown, 12 images]
[im 3/32  brain]
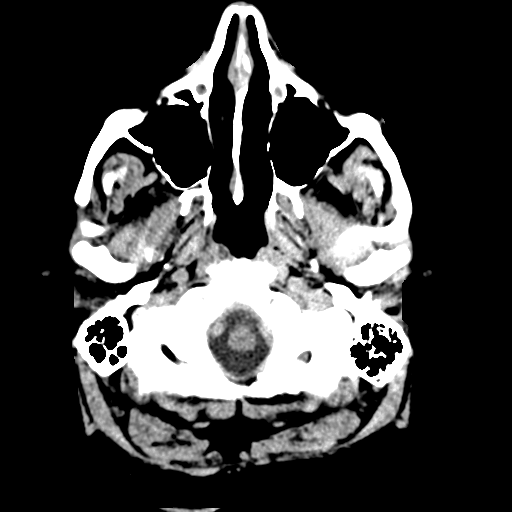
[im 3/32  bone]
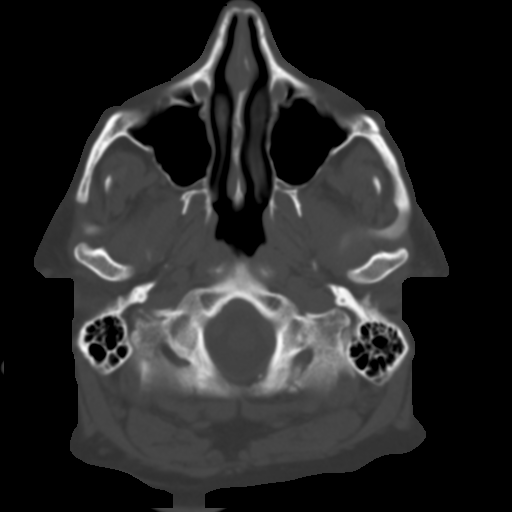
[im 6/32  brain]
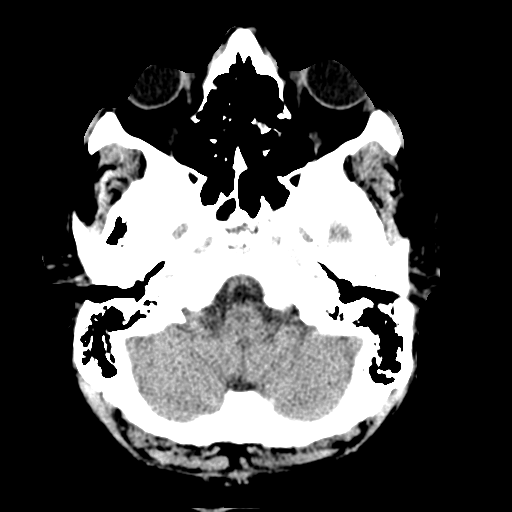
[im 9/32  brain]
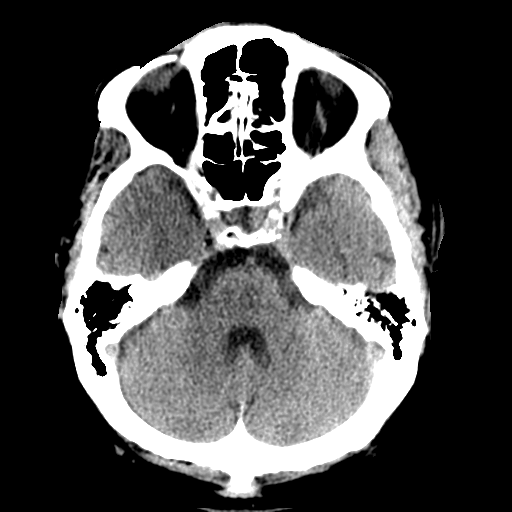
[im 12/32  brain]
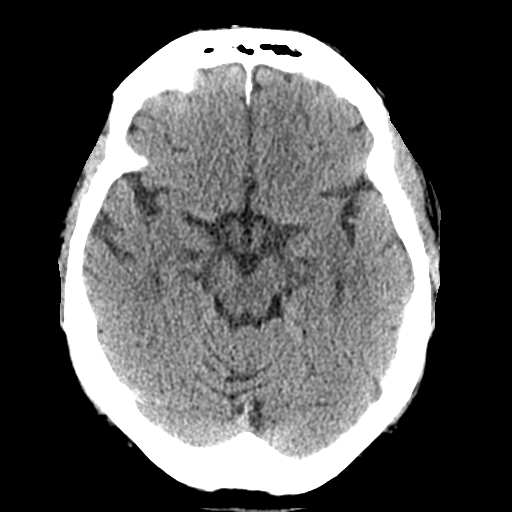
[im 17/32  brain]
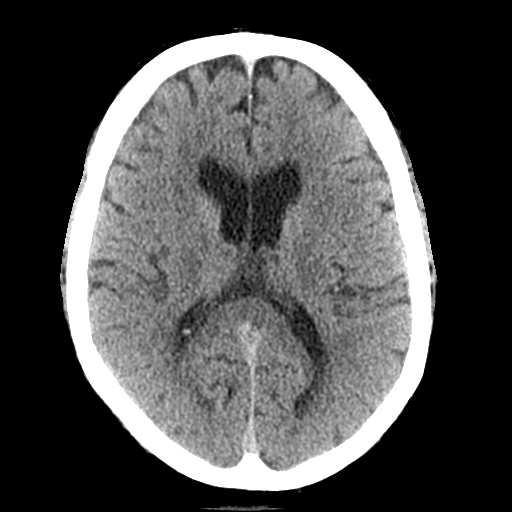
[im 17/32  bone]
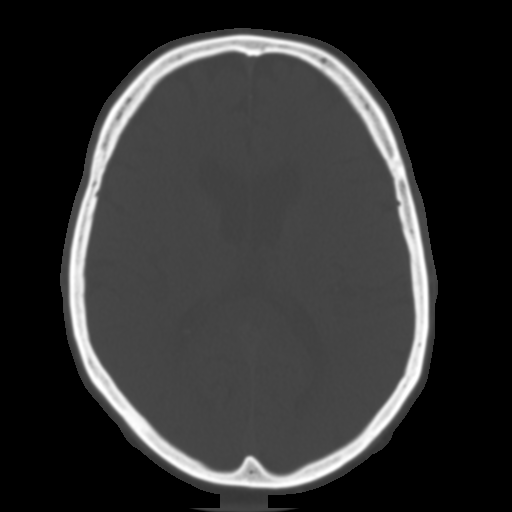
[im 20/32  brain]
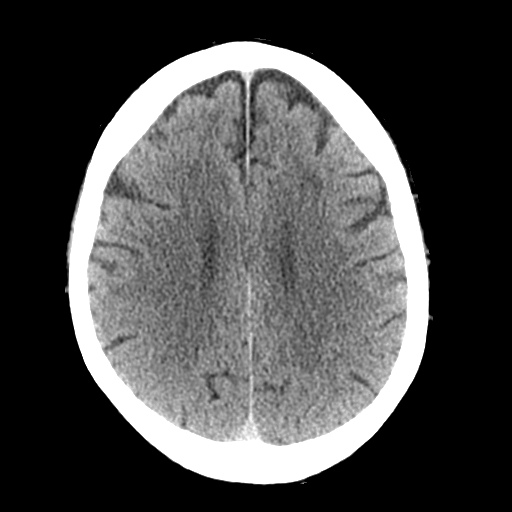
[im 23/32  brain]
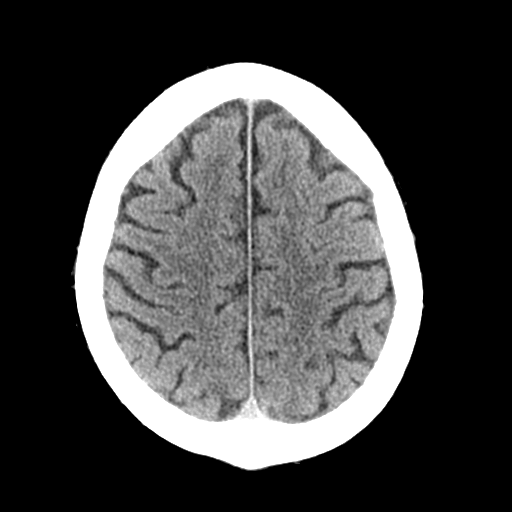
[im 26/32  brain]
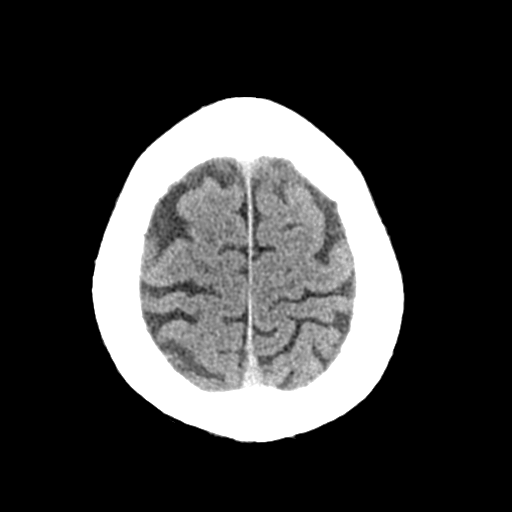
[im 29/32  brain]
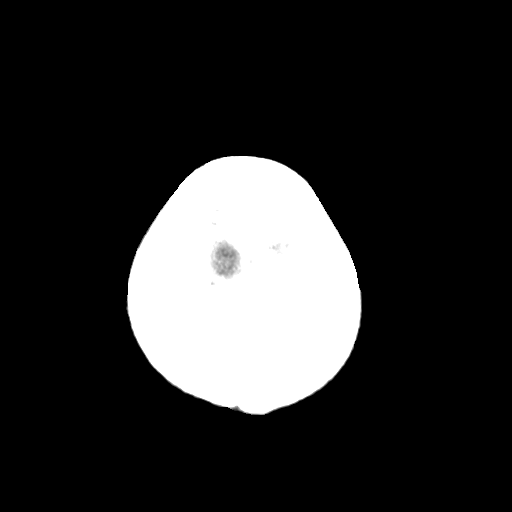
[im 29/32  bone]
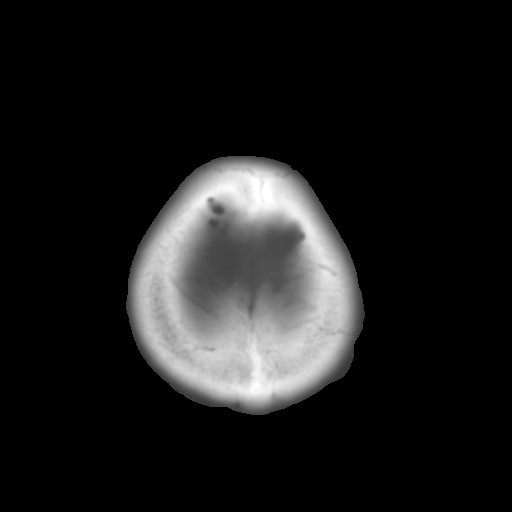

[Series 4: coronal soft tissue · coronal · 0.30mm/px · 3 of 71 slices shown]
[im 24/71  brain]
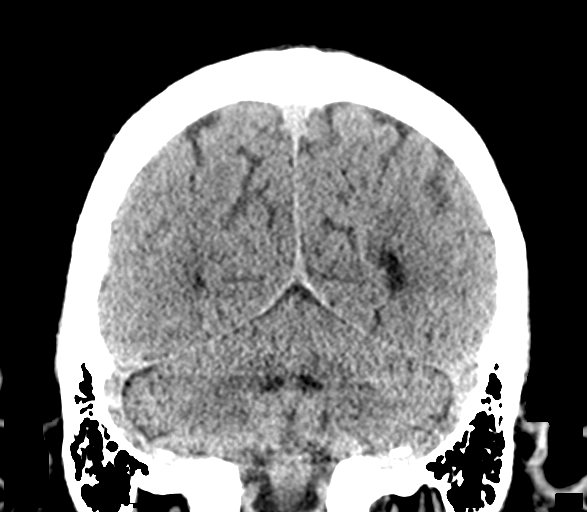
[im 32/71  brain]
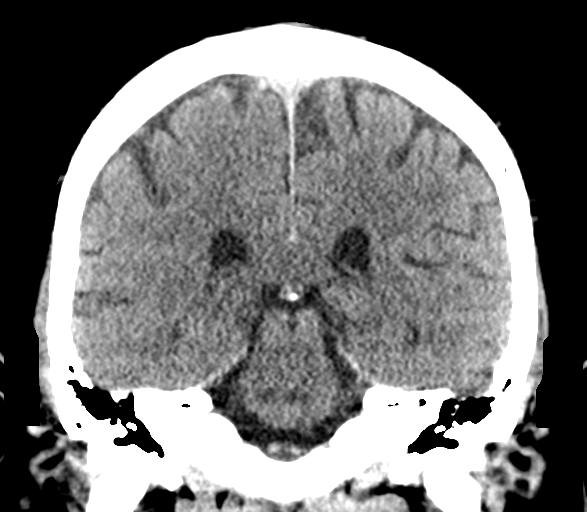
[im 39/71  brain]
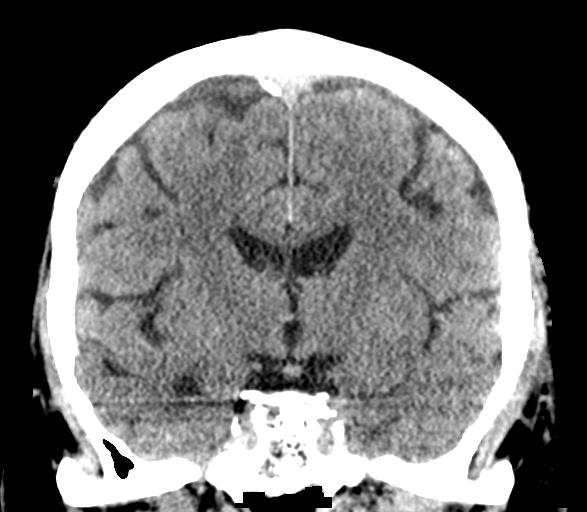

[Series 5: sagittal soft tissue · sagittal · 0.31mm/px · 3 of 59 slices shown]
[im 20/59  brain]
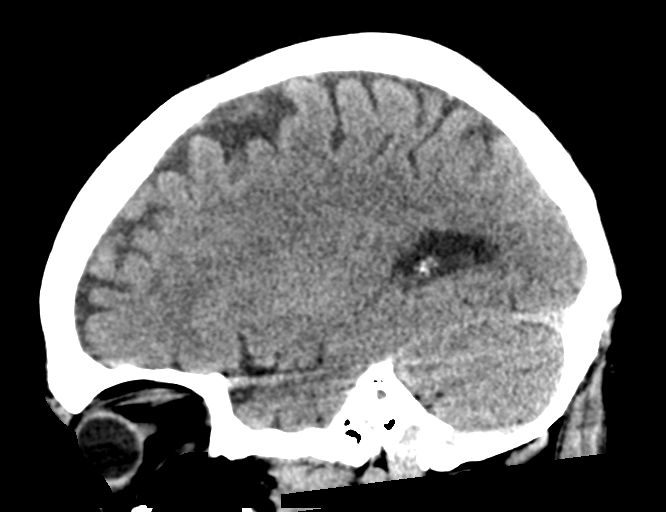
[im 30/59  brain]
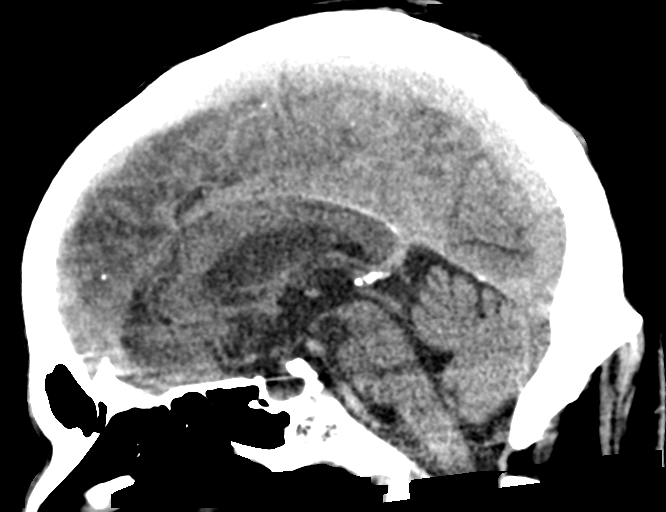
[im 39/59  brain]
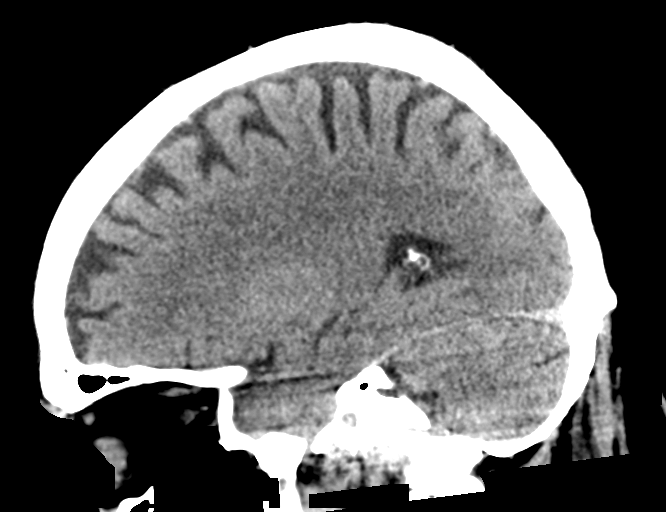

[15 of 47 positions shown; findings below may reference images not displayed]

FINDINGS: Brain: No acute infarct or intracranial hemorrhage. No mass lesion.
No midline shift, ventriculomegaly or extra-axial fluid collection.
Mild chronic microvascular ischemic changes. Cerebral volume is
within normal limits for patient's age.

Vascular: No hyperdense vessel or unexpected calcification.
Bilateral skull base atherosclerotic calcifications.

Skull: Negative for fracture or focal lesion.

Sinuses/Orbits: Normal orbits. Clear paranasal sinuses. No mastoid
effusion.

Other: None.
IMPRESSION: No acute intracranial process.

Mild chronic microvascular ischemic changes.

## 2020-01-14 IMAGING — MR MR LUMBAR SPINE W/O CM
5 series · 31 of 48 positions shown · non-contrast
Comparison: None available.

CLINICAL DATA: Initial evaluation for low back pain with acute
tingling in lower spine, weakness.

EXAM:
MRI LUMBAR SPINE WITHOUT CONTRAST
TECHNIQUE: Multiplanar, multisequence MR imaging of the lumbar spine was
performed. No intravenous contrast was administered.

[Series 9: T1 · sagittal · 4.0mm · 1.02mm/px · 6 of 19 slices shown (1 of 2)]
[im 1/19]
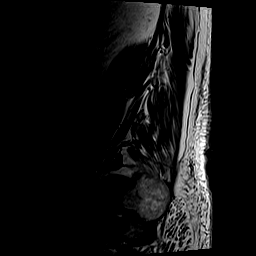
[im 4/19]
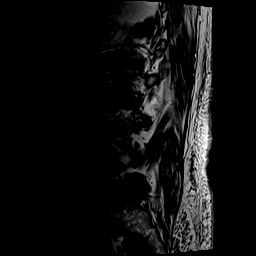
[im 8/19]
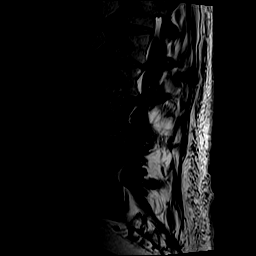
[im 11/19]
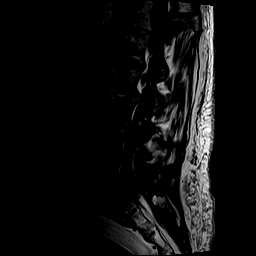
[im 15/19]
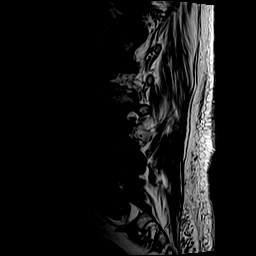
[im 19/19]
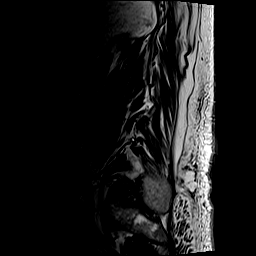

[Series 10: T2 · sagittal · 4.0mm · 1.02mm/px · 7 of 19 slices shown (1 of 2)]
[im 1/19]
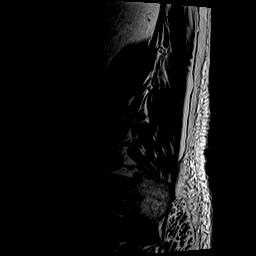
[im 4/19]
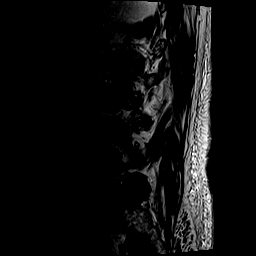
[im 7/19]
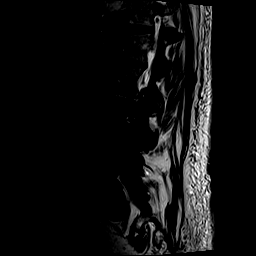
[im 10/19]
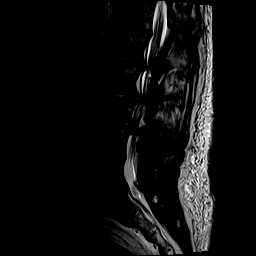
[im 13/19]
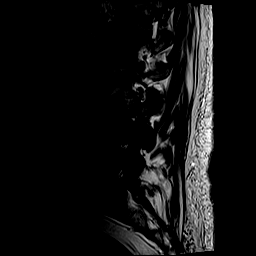
[im 16/19]
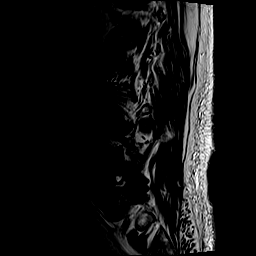
[im 19/19]
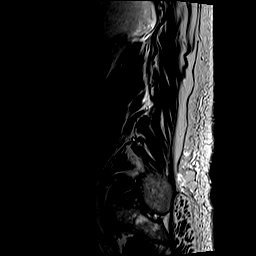

[Series 11: STIR · sagittal · 4.0mm · 0.51mm/px · 2 of 19 slices shown]
[im 1/19]
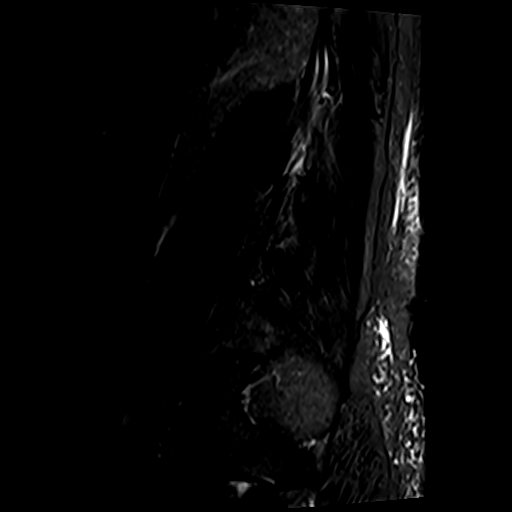
[im 4/19]
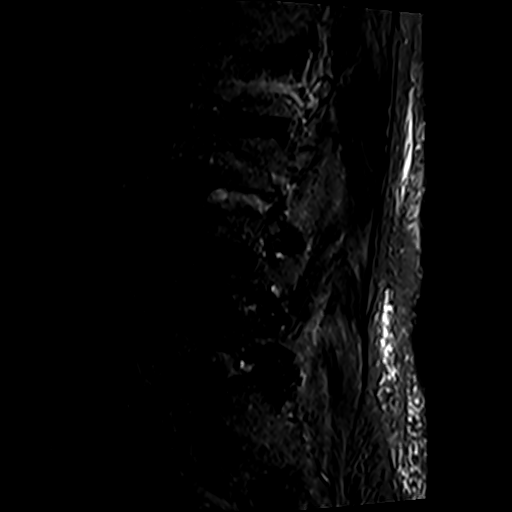

[Series 12: T2 · axial · 4.0mm · 0.78mm/px · z∈[-149,+77]mm · 8 of 38 slices shown (2 of 2)]
[im 1/38]
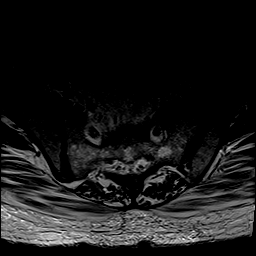
[im 6/38]
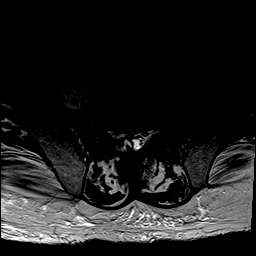
[im 12/38]
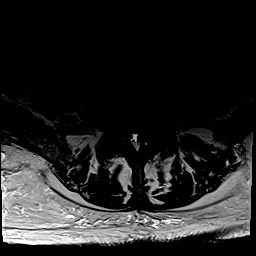
[im 18/38]
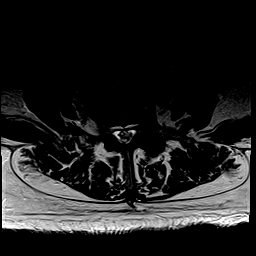
[im 20/38]
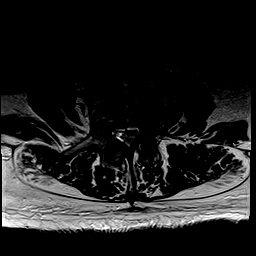
[im 26/38]
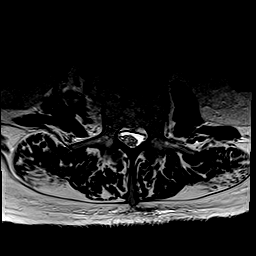
[im 32/38]
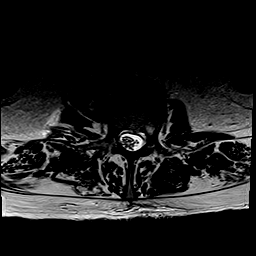
[im 38/38]
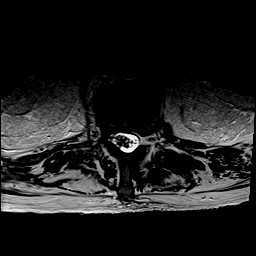

[Series 13: T1 · axial · 4.0mm · 0.39mm/px · z∈[-149,+77]mm · 8 of 38 slices shown (2 of 2)]
[im 1/38]
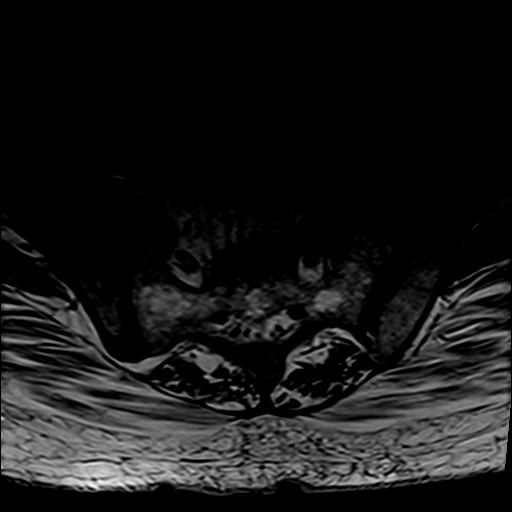
[im 6/38]
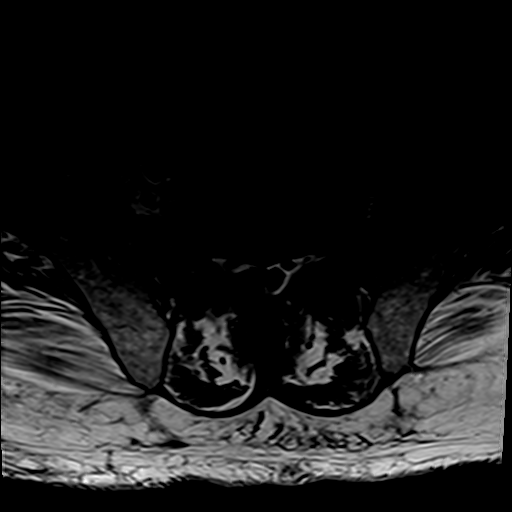
[im 12/38]
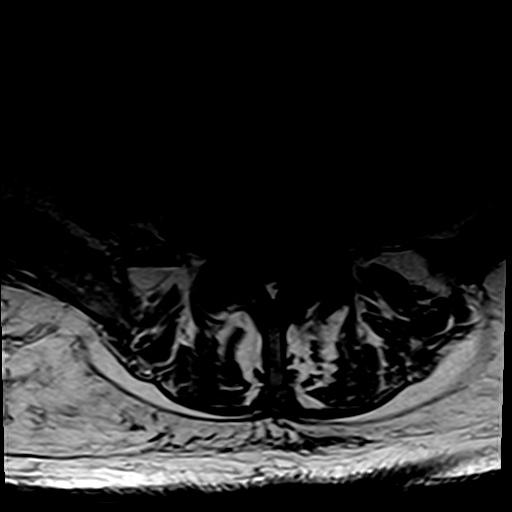
[im 18/38]
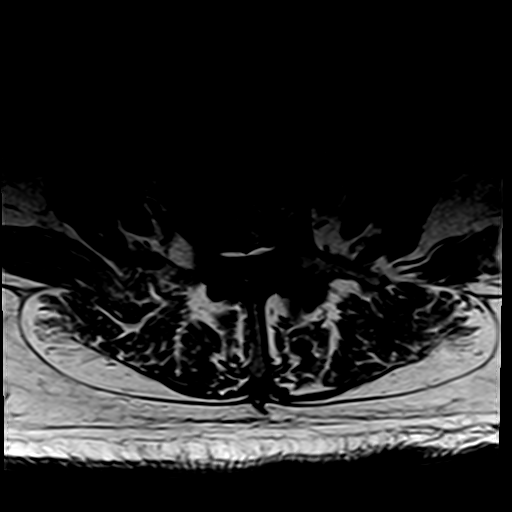
[im 20/38]
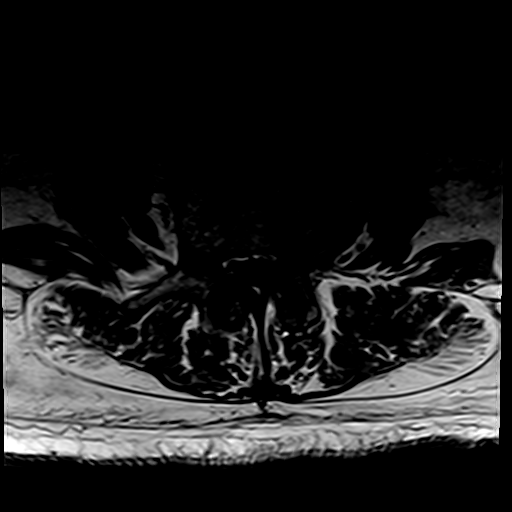
[im 26/38]
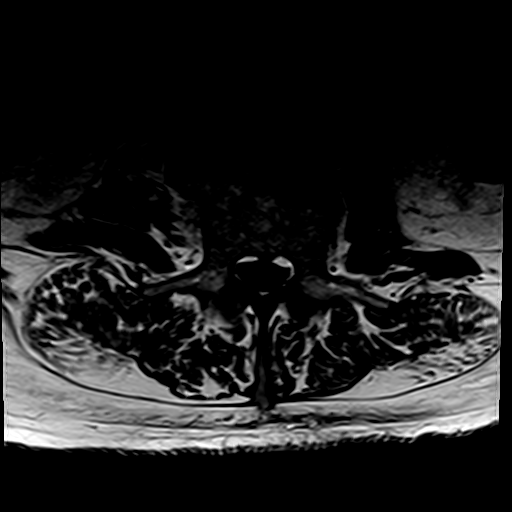
[im 32/38]
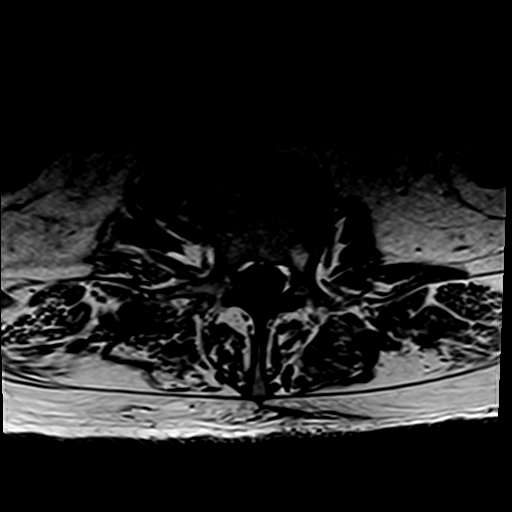
[im 38/38]
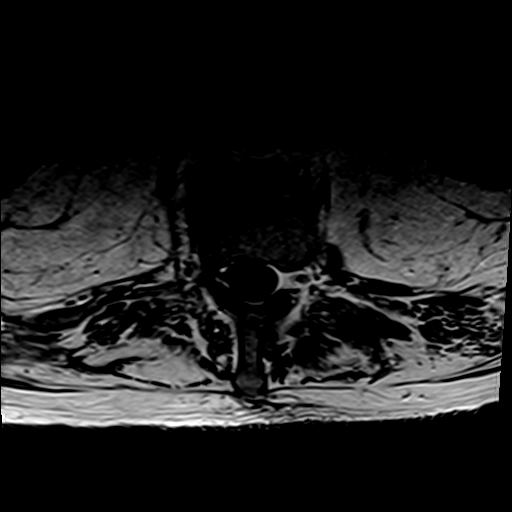

[31 of 48 positions shown; findings below may reference images not displayed]

FINDINGS: Segmentation: Standard. Lowest well-formed disc space labeled the
L5-S1 level.

Alignment: Dextroscoliosis with apex at L2-3. Trace 2 mm
anterolisthesis of L5 on S1, chronic and facet mediated.

Vertebrae: Vertebral body height maintained without acute or chronic
fracture. L2 and L3 vertebral bodies are partially ankylosed
anteriorly, degenerative in nature. Bone marrow signal intensity
diffusely heterogeneous without discrete or worrisome osseous
lesions. No abnormal marrow edema.

Conus medullaris and cauda equina: Conus extends to the L1 level.
Conus and cauda equina appear normal.

Paraspinal and other soft tissues: Paraspinous soft tissues
demonstrate no acute finding.

Disc levels:

T12-L1: Disc desiccation with mild disc bulge. Prominent anterior
endplate osteophytic spurring. Superimposed tiny right subarticular
disc protrusion mildly indents the right ventral thecal sac. Mild
facet hypertrophy. No significant canal or foraminal stenosis.

L1-2: Diffuse disc bulge with disc desiccation and intervertebral
disc space narrowing. Prominent reactive endplate osteophytic
spurring, most pronounced anteriorly and to the right. Disc bulge
slightly asymmetric to the right. Mild facet hypertrophy. Mild
narrowing of the right lateral recess without significant spinal
stenosis. Foramina remain patent.

L2-3: Severe degenerative intervertebral disc space narrowing with
partial ankylosis of the L2 and L3 vertebral bodies. Associated
pronounced circumferential reactive endplate osteophytic spurring
and diffuse disc bulge. Moderate bilateral facet hypertrophy.
Resultant severe spinal stenosis. Thecal sac measures 7-8 mm in AP
diameter at its most narrow point. Moderate left L2 foraminal
narrowing. Right neural foramen remains patent.

L3-4: Degenerative intervertebral disc space narrowing with diffuse
disc bulge and disc desiccation. Prominent reactive endplate
osteophytic spurring. Moderate to severe bilateral facet
hypertrophy, left worse than right. Cystic degeneration at the left
ligamentum flavum noted. Resultant severe spinal stenosis with near
effacement of the thecal sac. Thecal sac measures 4-5 mm in AP
diameter at its most narrow point. Mild-to-moderate bilateral L3
foraminal narrowing, right slightly worse than left.

L4-5: Mild diffuse disc bulge with disc desiccation. Prominent
reactive endplate osteophytic spurring. Moderate facet and ligament
flavum hypertrophy. Mild prominence of the dorsal epidural fat.
Resultant moderate canal with severe bilateral subarticular
stenosis. Severe left with moderate right L4 foraminal narrowing.

L5-S1: Trace anterolisthesis. Mild disc bulge with disc desiccation.
Endplate osteophytic spurring. Severe bilateral facet arthrosis.
Complex synovial cyst measuring 12 x 10 x 21 mm seen at the medial
aspect of the right L5-S1 facet, encroaching upon the right lateral
recess (series 12, image 33). Resultant moderate right lateral
recess stenosis with displacement of the descending right S2 nerve
root. Thecal sac remains patent. Initial 12 mm synovial cyst seen at
the anterior aspect of the right L5-S1 facet at the level of the
right neural foramen (series 12, image 30). Resultant mild left with
moderate right L5 foraminal stenosis.
IMPRESSION: 1. Multifactorial degenerative changes at L2-3 through L4-5 with
resultant severe canal with bilateral subarticular stenosis as
above, most pronounced at L3-4.
2. Two separate synovial cysts measuring up to 21 mm about the right
L5-S1 facet with resultant moderate right foraminal and right
lateral recess stenosis as above.
3. Moderate to severe bilateral L2 through L5 foraminal stenosis as
detailed above.

## 2020-01-14 IMAGING — DX DG CHEST 1V PORT
2 series · 2 of 2 positions shown · non-contrast
Comparison: None.

CLINICAL DATA: Weakness.

EXAM:
PORTABLE CHEST 1 VIEW

[chest ap (1 of 2)]
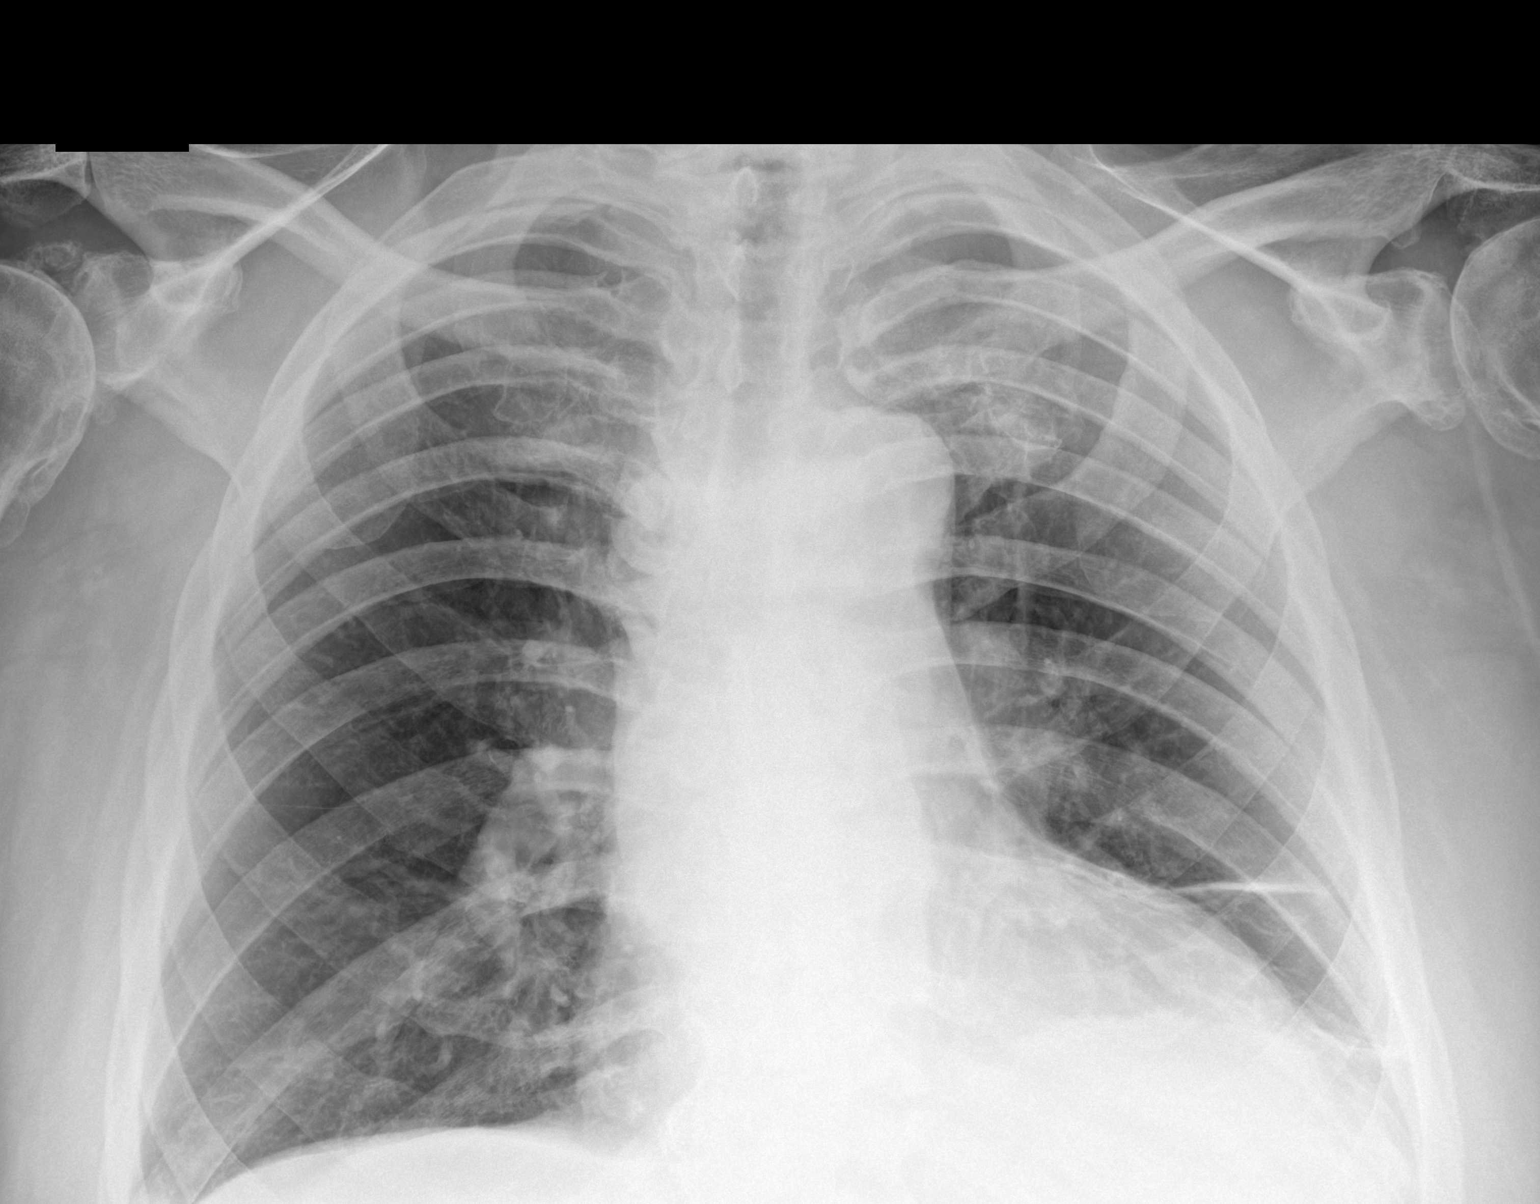

[chest ap (2 of 2)]
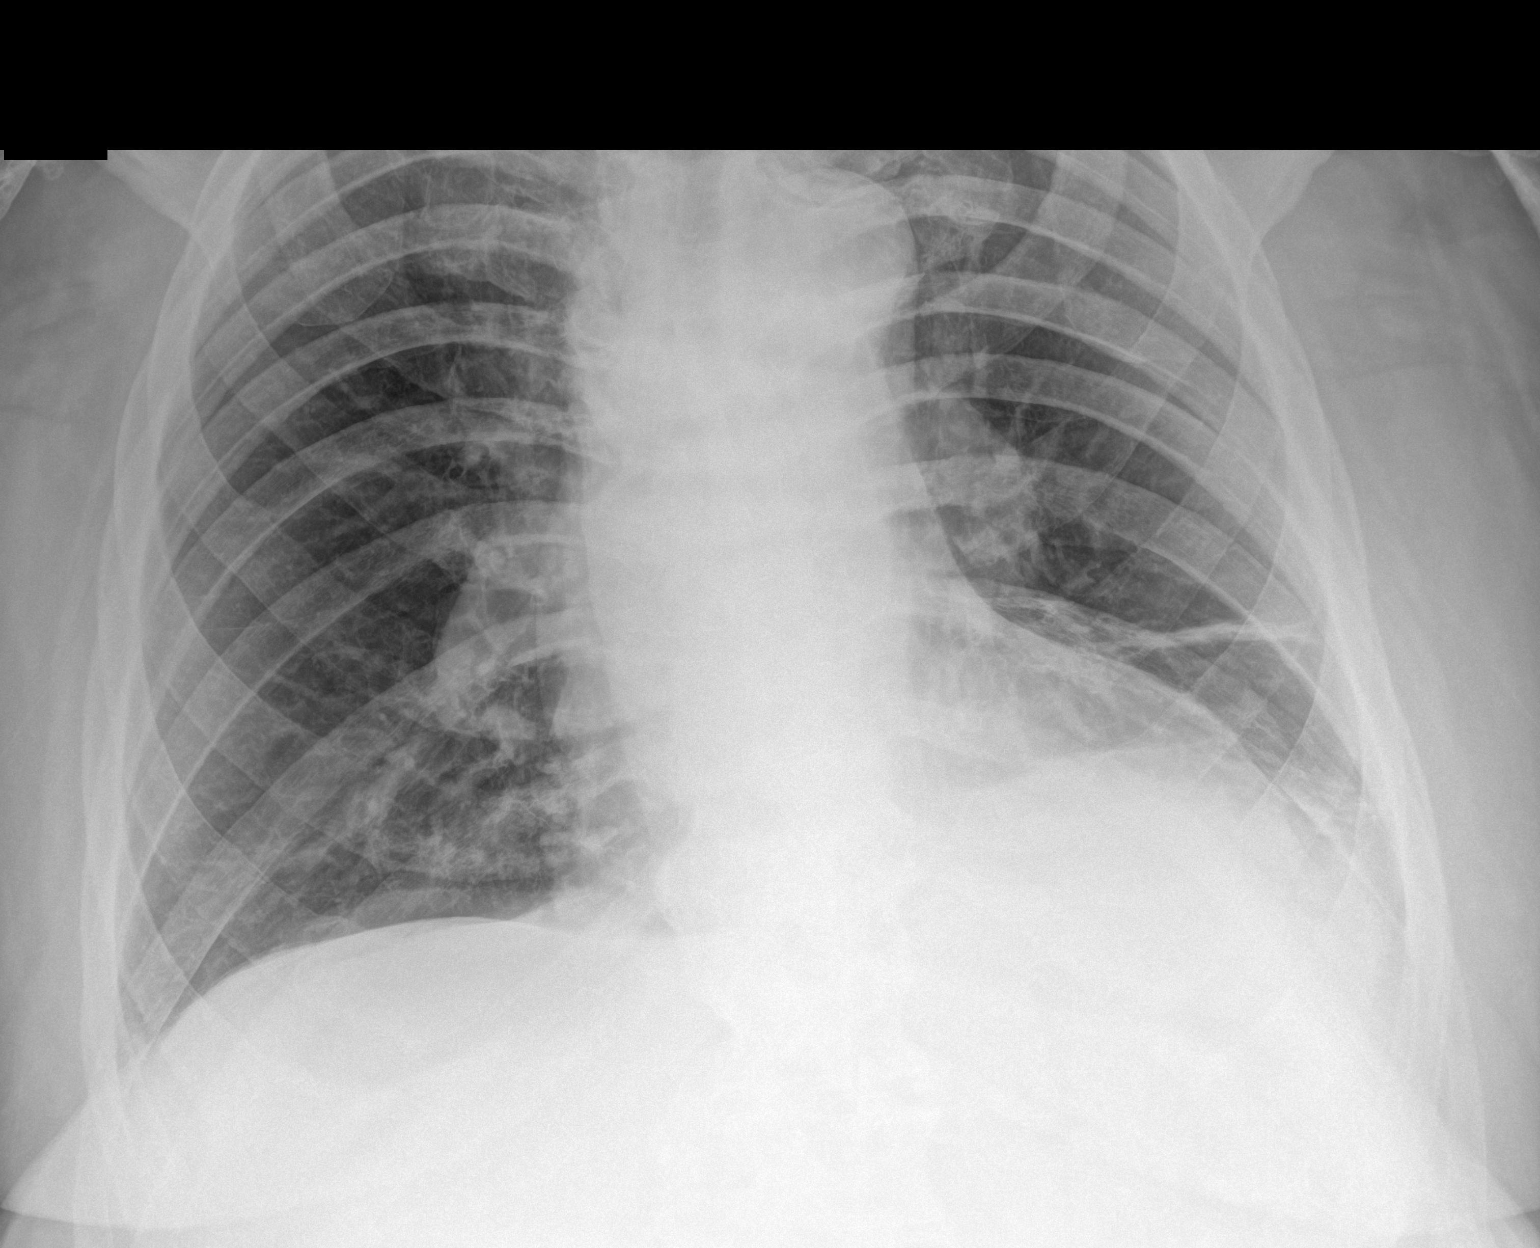

[2 of 2 positions shown; findings below may reference images not displayed]

FINDINGS: Upper normal heart size. Normal mediastinal contours. Coronary
artery calcifications versus stent. Streaky and bandlike opacities
at the left lung base. No confluent consolidation. No pulmonary
edema, pneumothorax, or large pleural effusion. No acute osseous
abnormalities are seen.
IMPRESSION: Streaky and bandlike opacities at the left lung base, favor
atelectasis.

## 2020-01-14 MED ORDER — BISACODYL 5 MG PO TBEC: 5.0000 mg | DELAYED_RELEASE_TABLET | Freq: Every day | ORAL | Status: DC | PRN

## 2020-01-14 MED ORDER — ONDANSETRON HCL 4 MG PO TABS: 4.0000 mg | ORAL_TABLET | Freq: Four times a day (QID) | ORAL | Status: DC | PRN

## 2020-01-14 MED ORDER — SENNOSIDES-DOCUSATE SODIUM 8.6-50 MG PO TABS: 1.0000 | ORAL_TABLET | Freq: Every evening | ORAL | Status: DC | PRN

## 2020-01-14 MED ORDER — ALBUTEROL SULFATE (2.5 MG/3ML) 0.083% IN NEBU: 2.5000 mg | INHALATION_SOLUTION | Freq: Four times a day (QID) | RESPIRATORY_TRACT | Status: DC | PRN

## 2020-01-14 MED ORDER — IPRATROPIUM BROMIDE 0.02 % IN SOLN: 0.5000 mg | Freq: Four times a day (QID) | RESPIRATORY_TRACT | Status: DC | PRN

## 2020-01-14 MED ORDER — ACETAMINOPHEN 650 MG RE SUPP: 650.0000 mg | Freq: Four times a day (QID) | RECTAL | Status: DC | PRN

## 2020-01-14 MED ORDER — ACETAMINOPHEN 325 MG PO TABS: 650.0000 mg | ORAL_TABLET | Freq: Four times a day (QID) | ORAL | Status: DC | PRN

## 2020-01-14 MED ORDER — ASPIRIN 325 MG PO TABS
325.0000 mg | ORAL_TABLET | Freq: Every day | ORAL | Status: DC
  Filled 2020-01-14 (×2): qty 1

## 2020-01-14 MED ORDER — CARVEDILOL 6.25 MG PO TABS: 12.5000 mg | ORAL_TABLET | Freq: Two times a day (BID) | ORAL | Status: DC

## 2020-01-14 MED ORDER — ONDANSETRON HCL 4 MG/2ML IJ SOLN: 4.0000 mg | Freq: Four times a day (QID) | INTRAMUSCULAR | Status: DC | PRN

## 2020-01-14 MED ORDER — TRAMADOL HCL 50 MG PO TABS
50.0000 mg | ORAL_TABLET | Freq: Four times a day (QID) | ORAL | Status: DC | PRN
  Administered 2020-01-19 – 2020-01-24 (×3): 50 mg via ORAL
  Filled 2020-01-14 (×3): qty 1

## 2020-01-14 MED ORDER — ASPIRIN 81 MG PO CHEW
243.0000 mg | CHEWABLE_TABLET | Freq: Once | ORAL | Status: AC
  Administered 2020-01-14: 243 mg via ORAL

## 2020-01-14 MED ORDER — INSULIN ASPART 100 UNIT/ML ~~LOC~~ SOLN
0.0000 [IU] | Freq: Every day | SUBCUTANEOUS | Status: DC
  Administered 2020-01-15 – 2020-01-21 (×6): 2 [IU] via SUBCUTANEOUS
  Administered 2020-01-22 – 2020-01-24 (×3): 3 [IU] via SUBCUTANEOUS
  Filled 2020-01-14 (×9): qty 1

## 2020-01-14 MED ORDER — SIMVASTATIN 20 MG PO TABS
20.0000 mg | ORAL_TABLET | Freq: Every day | ORAL | Status: DC
  Administered 2020-01-15 – 2020-01-25 (×11): 20 mg via ORAL
  Filled 2020-01-14: qty 1
  Filled 2020-01-14: qty 2
  Filled 2020-01-14 (×9): qty 1

## 2020-01-14 MED ORDER — ENOXAPARIN SODIUM 40 MG/0.4ML ~~LOC~~ SOLN
40.0000 mg | SUBCUTANEOUS | Status: DC
  Administered 2020-01-14 – 2020-01-24 (×11): 40 mg via SUBCUTANEOUS
  Filled 2020-01-14 (×11): qty 0.4

## 2020-01-14 MED ORDER — INSULIN ASPART 100 UNIT/ML ~~LOC~~ SOLN
0.0000 [IU] | Freq: Three times a day (TID) | SUBCUTANEOUS | Status: DC
  Administered 2020-01-15: 3 [IU] via SUBCUTANEOUS
  Administered 2020-01-15: 2 [IU] via SUBCUTANEOUS
  Administered 2020-01-15 – 2020-01-17 (×6): 3 [IU] via SUBCUTANEOUS
  Administered 2020-01-17: 2 [IU] via SUBCUTANEOUS
  Administered 2020-01-18 (×2): 5 [IU] via SUBCUTANEOUS
  Administered 2020-01-18: 3 [IU] via SUBCUTANEOUS
  Administered 2020-01-19: 5 [IU] via SUBCUTANEOUS
  Administered 2020-01-19: 2 [IU] via SUBCUTANEOUS
  Administered 2020-01-19: 5 [IU] via SUBCUTANEOUS
  Administered 2020-01-20 (×2): 3 [IU] via SUBCUTANEOUS
  Administered 2020-01-20 – 2020-01-21 (×2): 5 [IU] via SUBCUTANEOUS
  Administered 2020-01-21 (×2): 3 [IU] via SUBCUTANEOUS
  Administered 2020-01-22: 5 [IU] via SUBCUTANEOUS
  Administered 2020-01-22: 3 [IU] via SUBCUTANEOUS
  Administered 2020-01-22: 5 [IU] via SUBCUTANEOUS
  Administered 2020-01-23 (×2): 3 [IU] via SUBCUTANEOUS
  Administered 2020-01-23: 8 [IU] via SUBCUTANEOUS
  Administered 2020-01-24 (×2): 3 [IU] via SUBCUTANEOUS
  Administered 2020-01-24: 8 [IU] via SUBCUTANEOUS
  Administered 2020-01-25: 3 [IU] via SUBCUTANEOUS
  Administered 2020-01-25: 5 [IU] via SUBCUTANEOUS
  Filled 2020-01-14 (×31): qty 1

## 2020-01-14 MED ORDER — MORPHINE SULFATE (PF) 2 MG/ML IV SOLN
2.0000 mg | INTRAVENOUS | Status: DC | PRN
  Administered 2020-01-19 – 2020-01-23 (×6): 2 mg via INTRAVENOUS
  Filled 2020-01-14 (×7): qty 1

## 2020-01-14 MED ORDER — ASPIRIN 81 MG PO CHEW
324.0000 mg | CHEWABLE_TABLET | Freq: Once | ORAL | Status: DC
  Filled 2020-01-14: qty 4

## 2020-01-14 NOTE — ED Notes (Signed)
Pt back from MRI and placed on bp and O2 monitor

## 2020-01-14 NOTE — ED Notes (Signed)
Assumed care of pt, pt heavy 3 assist to stand and pivot from recliner to ED stretcher. Unable to bear weight on his legs, bilateral knee braces in place. Per wife pt has had multiple falls at home and is unable to get himself up, making his care more difficult to provide from home. Pt denies back pain when standing. At MRI at this time. AO x4. Talking in full sentences with regular and unlabored breathing. Dr. Darnelle Catalan talking with wife at this time.

## 2020-01-14 NOTE — H&P (Addendum)
History and Physical   [facility] [place] [facility] Admission History and Physical [employer], D.O.    Patient Name: Roy [name] MR#: [id number] Date of Birth: 05/01/1945 Date of Admission: 01/14/2020  Referring MD/NP/PA: Dr. Juliette Alcide Primary Care Physician: Patient, No Pcp Per  Chief Complaint:  Chief Complaint  Patient presents with  . Weakness    HPI: Roy [name] is a 74 y.o. male with a known history of diabetes, hypertension presents to the emergency department for evaluation of bilateral lower extremity weakness.  Patient attempted to use the bathroom this morning but was so weak in his lower extremities that he could not stand from the toilet.  He reports severe progressively worsening bilateral lower extremity weakness despite getting home physical therapy.  Patient reports associated low back pain which radiates down the backs of both legs.  Patient denies fevers/chills, dizziness, chest pain, shortness of breath, N/V/C/D, abdominal pain, dysuria/frequency, changes in mental status.    Otherwise there has been no change in status. Patient has been taking medication as prescribed and there has been no recent change in medication or diet.  No recent antibiotics.  There has been no recent illness, hospitalizations, travel or sick contacts.    EMS/ED Course: Marland Kitchen Medical admission has been requested for further management of bilateral lower extremity weakness, elevated troponin of unclear significance.  Review of Systems:  CONSTITUTIONAL: No fever/chills, fatigue, weakness, weight gain/loss, headache. EYES: No blurry or double vision. ENT: No tinnitus, postnasal drip, redness or soreness of the oropharynx. RESPIRATORY: No cough, dyspnea, wheeze.  No hemoptysis.  CARDIOVASCULAR: No chest pain, palpitations, syncope, orthopnea. No lower extremity edema.  GASTROINTESTINAL: No nausea, vomiting, abdominal pain, diarrhea, constipation.  No hematemesis, melena or  hematochezia. GENITOURINARY: No dysuria, frequency, hematuria. ENDOCRINE: No polyuria or nocturia. No heat or cold intolerance. HEMATOLOGY: No anemia, bruising, bleeding. INTEGUMENTARY: No rashes, ulcers, lesions. MUSCULOSKELETAL: No arthritis, gout, dyspnea. NEUROLOGIC: Bilateral lower extremity weakness,  numbness, tingling, ataxia, as per HPI.  PSYCHIATRIC: No anxiety, depression, insomnia.   Past Medical History:  Diagnosis Date  . Diabetes mellitus without complication (HCC)   . Hypertension     Past Surgical History:  Procedure Laterality Date  . arm surgery Right    4x surgery as a child, cut arm falling through glass window   . HYDROCELE EXCISION / REPAIR    . REPLACEMENT TOTAL KNEE Right      reports that he has never smoked. He does not have any smokeless tobacco history on file. He reports that he does not drink alcohol. No history on file for drug use.  No Known Allergies  History reviewed. No pertinent family history.  Prior to Admission medications   Medication Sig Start Date End Date Taking? Authorizing Provider  aspirin 325 MG tablet Take 325 mg by mouth daily.    [provider]  carvedilol (COREG) 12.5 MG tablet Take 12.5 mg by mouth 2 (two) times daily with a meal.    [provider]  docusate calcium (SURFAK) 240 MG capsule Take 240 mg by mouth daily.    [provider]  glipiZIDE (GLUCOTROL) 10 MG tablet Take 10 mg by mouth daily before breakfast.    [provider]  insulin aspart (NOVOLOG) 100 UNIT/ML injection Inject 50 Units into the skin once.    [provider]  ranitidine (ZANTAC) 15 MG/ML syrup Take by mouth 2 (two) times daily.    [provider]  simvastatin (ZOCOR) 20 MG tablet Take  20 mg by mouth daily.    [provider]    Physical Exam: Vitals:   01/14/20 1126 01/14/20 1137 01/14/20 1941 01/14/20 2100  BP: (!) 173/100  (!) 175/96 (!) 160/95  Pulse: 73  74 63  Resp: 19  20  17   Temp: 97.9 F (36.6 C)     TempSrc: Oral     SpO2: 98%  96% 99%  Weight:  114.3 kg    Height:  6\' 3"  (1.905 m)      GENERAL: 74 y.o.-year-old white male patient, well-developed, well-nourished lying in the bed in no acute distress.  Pleasant and cooperative.   HEENT: Head atraumatic, normocephalic. Pupils equal. Mucus membranes moist. NECK: Supple. No JVD. CHEST: Normal breath sounds bilaterally. No wheezing, rales, rhonchi or crackles. No use of accessory muscles of respiration.  No reproducible chest wall tenderness.  CARDIOVASCULAR: S1, S2 normal. No murmurs, rubs, or gallops. Cap refill <2 seconds. Pulses intact distally.  ABDOMEN: Soft, nondistended, nontender. No rebound, guarding, rigidity. Normoactive bowel sounds present in all four quadrants.  EXTREMITIES: No pedal edema, cyanosis, or clubbing. No calf tenderness or Homan's sign.  NEUROLOGIC: The patient is alert and oriented x 3. Cranial nerves II through XII are grossly intact.  Difficult bilateral lower extremity weakness.  Unable to bear weight for longer than 10 seconds. PSYCHIATRIC:  Normal affect, mood, thought content. SKIN: Warm, dry, and intact without obvious rash, lesion, or ulcer with the exception of dry skin on both legs with multiple abrasions.    Labs on Admission:  CBC: Recent Labs  Lab 01/14/20 1143  WBC 9.9  HGB 15.6  HCT 47.1  MCV 95.2  PLT 197   Basic Metabolic Panel: Recent Labs  Lab 01/14/20 1143  NA 141  K 3.9  CL 104  CO2 27  GLUCOSE 133*  BUN 20  CREATININE 1.07  CALCIUM 9.9   GFR: Estimated Creatinine Clearance: 82.6 mL/min (by C-G formula based on SCr of 1.07 mg/dL). Liver Function Tests: No results for input(s): AST, ALT, ALKPHOS, BILITOT, PROT, ALBUMIN in the last 168 hours. No results for input(s): LIPASE, AMYLASE in the last 168 hours. No results for input(s): AMMONIA in the last 168 hours. Coagulation Profile: No results for input(s): INR, PROTIME in the last 168  hours. Cardiac Enzymes: No results for input(s): CKTOTAL, CKMB, CKMBINDEX, TROPONINI in the last 168 hours. BNP (last 3 results) No results for input(s): PROBNP in the last 8760 hours. HbA1C: No results for input(s): HGBA1C in the last 72 hours. CBG: No results for input(s): GLUCAP in the last 168 hours. Lipid Profile: No results for input(s): CHOL, HDL, LDLCALC, TRIG, CHOLHDL, LDLDIRECT in the last 72 hours. Thyroid Function Tests: No results for input(s): TSH, T4TOTAL, FREET4, T3FREE, THYROIDAB in the last 72 hours. Anemia Panel: No results for input(s): VITAMINB12, FOLATE, FERRITIN, TIBC, IRON, RETICCTPCT in the last 72 hours. Urine analysis:    Component Value Date/Time   COLORURINE YELLOW (A) 01/14/2020 1139   APPEARANCEUR HAZY (A) 01/14/2020 1139   LABSPEC 1.017 01/14/2020 1139   PHURINE 5.0 01/14/2020 1139   GLUCOSEU NEGATIVE 01/14/2020 1139   HGBUR NEGATIVE 01/14/2020 1139   BILIRUBINUR NEGATIVE 01/14/2020 1139   KETONESUR 20 (A) 01/14/2020 1139   PROTEINUR 100 (A) 01/14/2020 1139   NITRITE NEGATIVE 01/14/2020 1139   LEUKOCYTESUR SMALL (A) 01/14/2020 1139   Sepsis Labs: @LABRCNTIP (procalcitonin:4,lacticidven:4) )No results found for this or any previous visit (from the past 240 hour(s)).   Radiological Exams on Admission:  CT Head Wo Contrast  Result Date: 01/14/2020 CLINICAL DATA:  Head trauma EXAM: CT HEAD WITHOUT CONTRAST TECHNIQUE: Contiguous axial images were obtained from the base of the skull through the vertex without intravenous contrast. COMPARISON:  None. FINDINGS: Brain: No acute infarct or intracranial hemorrhage. No mass lesion. No midline shift, ventriculomegaly or extra-axial fluid collection. Mild chronic microvascular ischemic changes. Cerebral volume is within normal limits for patient's age. Vascular: No hyperdense vessel or unexpected calcification. Bilateral skull base atherosclerotic calcifications. Skull: Negative for fracture or focal lesion.  Sinuses/Orbits: Normal orbits. Clear paranasal sinuses. No mastoid effusion. Other: None. IMPRESSION: No acute intracranial process. Mild chronic microvascular ischemic changes. Electronically Signed   By: Stana Bunting M.D.   On: 01/14/2020 12:18   MR LUMBAR SPINE WO CONTRAST  Result Date: 01/14/2020 CLINICAL DATA:  Initial evaluation for low back pain with acute tingling in lower spine, weakness. EXAM: MRI LUMBAR SPINE WITHOUT CONTRAST TECHNIQUE: Multiplanar, multisequence MR imaging of the lumbar spine was performed. No intravenous contrast was administered. COMPARISON:  None available. FINDINGS: Segmentation: Standard. Lowest well-formed disc space labeled the L5-S1 level. Alignment: Dextroscoliosis with apex at L2-3. Trace 2 mm anterolisthesis of L5 on S1, chronic and facet mediated. Vertebrae: Vertebral body height maintained without acute or chronic fracture. L2 and L3 vertebral bodies are partially ankylosed anteriorly, degenerative in nature. Bone marrow signal intensity diffusely heterogeneous without discrete or worrisome osseous lesions. No abnormal marrow edema. Conus medullaris and cauda equina: Conus extends to the L1 level. Conus and cauda equina appear normal. Paraspinal and other soft tissues: Paraspinous soft tissues demonstrate no acute finding. Disc levels: T12-L1: Disc desiccation with mild disc bulge. Prominent anterior endplate osteophytic spurring. Superimposed tiny right subarticular disc protrusion mildly indents the right ventral thecal sac. Mild facet hypertrophy. No significant canal or foraminal stenosis. L1-2: Diffuse disc bulge with disc desiccation and intervertebral disc space narrowing. Prominent reactive endplate osteophytic spurring, most pronounced anteriorly and to the right. Disc bulge slightly asymmetric to the right. Mild facet hypertrophy. Mild narrowing of the right lateral recess without significant spinal stenosis. Foramina remain patent. L2-3: Severe  degenerative intervertebral disc space narrowing with partial ankylosis of the L2 and L3 vertebral bodies. Associated pronounced circumferential reactive endplate osteophytic spurring and diffuse disc bulge. Moderate bilateral facet hypertrophy. Resultant severe spinal stenosis. Thecal sac measures 7-8 mm in AP diameter at its most narrow point. Moderate left L2 foraminal narrowing. Right neural foramen remains patent. L3-4: Degenerative intervertebral disc space narrowing with diffuse disc bulge and disc desiccation. Prominent reactive endplate osteophytic spurring. Moderate to severe bilateral facet hypertrophy, left worse than right. Cystic degeneration at the left ligamentum flavum noted. Resultant severe spinal stenosis with near effacement of the thecal sac. Thecal sac measures 4-5 mm in AP diameter at its most narrow point. Mild-to-moderate bilateral L3 foraminal narrowing, right slightly worse than left. L4-5: Mild diffuse disc bulge with disc desiccation. Prominent reactive endplate osteophytic spurring. Moderate facet and ligament flavum hypertrophy. Mild prominence of the dorsal epidural fat. Resultant moderate canal with severe bilateral subarticular stenosis. Severe left with moderate right L4 foraminal narrowing. L5-S1: Trace anterolisthesis. Mild disc bulge with disc desiccation. Endplate osteophytic spurring. Severe bilateral facet arthrosis. Complex synovial cyst measuring 12 x 10 x 21 mm seen at the medial aspect of the right L5-S1 facet, encroaching upon the right lateral recess (series 12, image 33). Resultant moderate right lateral recess stenosis with displacement of the descending right S2 nerve root. Thecal sac remains patent. Initial 12 mm  synovial cyst seen at the anterior aspect of the right L5-S1 facet at the level of the right neural foramen (series 12, image 30). Resultant mild left with moderate right L5 foraminal stenosis. IMPRESSION: 1. Multifactorial degenerative changes at L2-3  through L4-5 with resultant severe canal with bilateral subarticular stenosis as above, most pronounced at L3-4. 2. 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 3. Moderate to severe bilateral L2 through L5 foraminal stenosis as detailed above. Electronically Signed   By: Rise Mu M.D.   On: 01/14/2020 19:42   DG Chest Portable 1 View  Result Date: 01/14/2020 CLINICAL DATA:  Weakness. EXAM: PORTABLE CHEST 1 VIEW COMPARISON:  None. FINDINGS: Upper normal heart size. Normal mediastinal contours. Coronary artery calcifications versus stent. Streaky and bandlike opacities at the left lung base. No confluent consolidation. No pulmonary edema, pneumothorax, or large pleural effusion. No acute osseous abnormalities are seen. IMPRESSION: Streaky and bandlike opacities at the left lung base, favor atelectasis. Electronically Signed   By: Narda Rutherford M.D.   On: 01/14/2020 18:36    EKG: Normal sinus rhythm at 74 bpm with leftward axis, right bundle branch block and left anterior hemiblock and nonspecific ST-T wave changes.   Assessment/Plan  This is a 74 y.o. male with a history of diabetes, hypertension now being admitted with:  #.  Bilateral lower extremity weakness with gait instability affecting activities of daily living likely secondary to severe lumbar spinal stenosis -Admit inpatient -Physical therapy evaluation and treatment for possible placement -Pain control -Neurosurgery and Dr. Adriana Simas has been consulted by the emergency department physician and he will consult. -DVT prophylaxis  #.  Elevated troponin of unclear significance, no chest pain -Patient received aspirin in the emergency department and will continue daily -We will monitor on telemetry -Trend troponins  #.H/o Diabetes - Accuchecks achs with RISS coverage - Heart healthy, carb controlled  diet -Hold glipizide  #.  History of hypertension -Continue Coreg  #. History of hyperlipidemia - Continue Zocor  Admission status: Inpatient, telemetry IV Fluids: Hep-Lock Diet/Nutrition: Heart healthy, carb controlled Consults called: Neurosurgery DVT Px: Lovenox, SCDs and early ambulation. Code Status: Full Code  Disposition Plan: To be determined  All the records are reviewed and case discussed with ED provider. Management plans discussed with the patient and/or family who express understanding and agree with plan of care.  Winnie Barsky D.O. on 01/14/2020 at 9:22 PM CC: Primary care physician; Patient, No Pcp Per   01/14/2020, 9:22 PM

## 2020-01-14 NOTE — ED Triage Notes (Signed)
Pt presents to ED via EMS, pt states awoke this morning with c/o tingling to his lower spine. Pt states went to the bathroom this morning and wa unable to get off the toilet seat. Pt states hx of neuropathy. Pt states L leg gave out on him and he fell. Pt states EMS was called. Pt states pain to L hand but states he thinks he has carpal tunnel to R hand. Pt states increasing falls x 2-3 months.

## 2020-01-14 NOTE — ED Provider Notes (Signed)
[facility] Emergency Department Provider Note   ____________________________________________   First MD Initiated Contact with Patient 01/14/20 1735     (approximate)  I have reviewed the triage vital signs and the nursing notes.   HISTORY  Chief Complaint Weakness    HPI Jayveon Convey. is a 74 y.o. 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  He reports he went to the bathroom this morning and then could not get up off the toilet.  His grandson had to help him up.  Then his left knee started to give way.  Patient does have trouble with his knee giving way but it is much worse now.  He is getting home PT but his wife who I spoke with on the phone says he is not really progressing he seems to just be holding on to what he has.  Patient said he had some electric-like tingling at the base of his spine when he was sitting on the toilet.  This is now gone.  He does not have any back pain currently.  The nurse and I are able to stand him up and he does not have back pain then either but his left leg is very weak and he has to sit down because he says it is going to give way on him.  He is only able to stand for about 10 seconds.         Past Medical History:  Diagnosis Date  . Diabetes mellitus without complication (HCC)   . Hypertension     There are no problems to display for this patient.   Past Surgical History:  Procedure Laterality Date  . arm surgery Right    4x surgery as a child, cut arm falling through glass window   . HYDROCELE EXCISION / REPAIR    . REPLACEMENT TOTAL KNEE Right     Prior to Admission medications   Medication Sig Start Date End Date Taking? Authorizing Provider  aspirin 325 MG tablet Take 325 mg by mouth daily.    [provider]  carvedilol (COREG) 12.5 MG tablet Take 12.5 mg by mouth 2 (two) times daily with a meal.    [provider]  docusate calcium  (SURFAK) 240 MG capsule Take 240 mg by mouth daily.    [provider]  glipiZIDE (GLUCOTROL) 10 MG tablet Take 10 mg by mouth daily before breakfast.    [provider]  insulin aspart (NOVOLOG) 100 UNIT/ML injection Inject 50 Units into the skin once.    [provider]  ranitidine (ZANTAC) 15 MG/ML syrup Take by mouth 2 (two) times daily.    [provider]  simvastatin (ZOCOR) 20 MG tablet Take 20 mg by mouth daily.    [provider]    Allergies Patient has no known allergies.  History reviewed. No pertinent family history.  Social History Social History   Tobacco Use  . Smoking status: Never Smoker  Substance Use Topics  . Alcohol use: No  . Drug use: Not on file    Review of Systems  Constitutional: No fever/chills Eyes: No visual changes. ENT: No sore throat. Cardiovascular: Denies chest pain. Respiratory: Denies shortness of breath. Gastrointestinal: No abdominal pain.  No nausea, no vomiting.  No diarrhea.  No constipation. Genitourinary: Negative for dysuria. Musculoskeletal: Negative for back pain. Skin: Negative for rash. Neurological: Negative for headaches  ____________________________________________   PHYSICAL EXAM:  VITAL SIGNS: ED Triage Vitals  Enc Vitals Group     BP 01/14/20 1126 (!) 173/100     Pulse Rate 01/14/20 1126 73     Resp 01/14/20 1126 19     Temp 01/14/20 1126 97.9 F (36.6 C)     Temp Source 01/14/20 1126 Oral     SpO2 01/14/20 1126 98 %     Weight 01/14/20 1137 252 lb (114.3 kg)     Height 01/14/20 1137 6\' 3"  (1.905 m)     Head Circumference --      Peak Flow --      Pain Score 01/14/20 1137 0     Pain Loc --      Pain Edu? --      Excl. in GC? --     Constitutional: Alert and oriented. Well appearing and in no acute distress. Eyes: Conjunctivae are normal. PERRL. EOMI. Head: Atraumatic. Nose: No congestion/rhinnorhea. Mouth/Throat: Mucous membranes are moist.  Oropharynx  non-erythematous. Neck: No stridor.  Cardiovascular: Normal rate, regular rhythm. Grossly normal heart sounds.  Good peripheral circulation. Respiratory: Normal respiratory effort.  No retractions. Lungs CTAB. Gastrointestinal: Soft and nontender. No distention. No abdominal bruits. No CVA tenderness. Musculoskeletal: No lower extremity tenderness nor edema.  No joint effusions. Neurologic:  Normal speech and language. No gross focal neurologic deficits are appreciated. No gait instability. Skin:  Skin is warm, dry  he does have a dry skin rash on both legs.  He has multiple bruises and abrasions   ____________________________________________   LABS (all labs ordered are listed, but only abnormal results are displayed)  Labs Reviewed  BASIC METABOLIC PANEL - Abnormal; Notable for the following components:      Result Value   Glucose, Bld 133 (*)    All other components within normal limits  URINALYSIS, COMPLETE (UACMP) WITH MICROSCOPIC - Abnormal; Notable for the following components:   Color, Urine YELLOW (*)    APPearance HAZY (*)    Ketones, ur 20 (*)    Protein, ur 100 (*)    Leukocytes,Ua SMALL (*)    All other components within normal limits  TROPONIN I (HIGH SENSITIVITY) - Abnormal; Notable for the following components:   Troponin I (High Sensitivity) 22 (*)    All other components within normal limits  CBC  CBG MONITORING, ED  TROPONIN I (HIGH SENSITIVITY)   ____________________________________________  EKG  EKG read interpreted by me shows normal sinus rhythm rate of 74 left axis 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  No old EKGs to compare this to. ____________________________________________  RADIOLOGY I, 03/15/20, personally viewed and evaluated these images (plain radiographs) as part of my medical decision making, as well as reviewing the written report by the radiologist.  ED MD interpretation: CT of the head read by radiology reviewed  by me is negative MRI looked at by me and read by radiology shows spinal stenosis chest x-ray read by radiology reviewed by me does not show anything that would explain the patient's symptoms  Official radiology report(s): CT Head Wo Contrast  Result Date: 01/14/2020 CLINICAL DATA:  Head trauma EXAM: CT HEAD WITHOUT CONTRAST TECHNIQUE: Contiguous axial images were obtained from the base of the skull through the vertex without intravenous contrast. COMPARISON:  None. FINDINGS: Brain: No acute infarct or intracranial hemorrhage. No mass lesion. No midline shift, ventriculomegaly or extra-axial fluid collection. Mild chronic microvascular ischemic changes. Cerebral volume is within normal limits for patient's age. Vascular: No hyperdense  vessel or unexpected calcification. Bilateral skull base atherosclerotic calcifications. Skull: Negative for fracture or focal lesion. Sinuses/Orbits: Normal orbits. Clear paranasal sinuses. No mastoid effusion. Other: None. IMPRESSION: No acute intracranial process. Mild chronic microvascular ischemic changes. Electronically Signed   By: Stana Bunting M.D.   On: 01/14/2020 12:18   MR LUMBAR SPINE WO CONTRAST  Result Date: 01/14/2020 CLINICAL DATA:  Initial evaluation for low back pain with acute tingling in lower spine, weakness. EXAM: MRI LUMBAR SPINE WITHOUT CONTRAST TECHNIQUE: Multiplanar, multisequence MR imaging of the lumbar spine was performed. No intravenous contrast was administered. COMPARISON:  None available. FINDINGS: Segmentation: Standard. Lowest well-formed disc space labeled the L5-S1 level. Alignment: Dextroscoliosis with apex at L2-3. Trace 2 mm anterolisthesis of L5 on S1, chronic and facet mediated. Vertebrae: Vertebral body height maintained without acute or chronic fracture. L2 and L3 vertebral bodies are partially ankylosed anteriorly, degenerative in nature. Bone marrow signal intensity diffusely heterogeneous without discrete or worrisome  osseous lesions. No abnormal marrow edema. Conus medullaris and cauda equina: Conus extends to the L1 level. Conus and cauda equina appear normal. Paraspinal and other soft tissues: Paraspinous soft tissues demonstrate no acute finding. Disc levels: T12-L1: Disc desiccation with mild disc bulge. Prominent anterior endplate osteophytic spurring. Superimposed tiny right subarticular disc protrusion mildly indents the right ventral thecal sac. Mild facet hypertrophy. No significant canal or foraminal stenosis. L1-2: Diffuse disc bulge with disc desiccation and intervertebral disc space narrowing. Prominent reactive endplate osteophytic spurring, most pronounced anteriorly and to the right. Disc bulge slightly asymmetric to the right. Mild facet hypertrophy. Mild narrowing of the right lateral recess without significant spinal stenosis. Foramina remain patent. L2-3: Severe degenerative intervertebral disc space narrowing with partial ankylosis of the L2 and L3 vertebral bodies. Associated pronounced circumferential reactive endplate osteophytic spurring and diffuse disc bulge. Moderate bilateral facet hypertrophy. Resultant severe spinal stenosis. Thecal sac measures 7-8 mm in AP diameter at its most narrow point. Moderate left L2 foraminal narrowing. Right neural foramen remains patent. L3-4: Degenerative intervertebral disc space narrowing with diffuse disc bulge and disc desiccation. Prominent reactive endplate osteophytic spurring. Moderate to severe bilateral facet hypertrophy, left worse than right. Cystic degeneration at the left ligamentum flavum noted. Resultant severe spinal stenosis with near effacement of the thecal sac. Thecal sac measures 4-5 mm in AP diameter at its most narrow point. Mild-to-moderate bilateral L3 foraminal narrowing, right slightly worse than left. L4-5: Mild diffuse disc bulge with disc desiccation. Prominent reactive endplate osteophytic spurring. Moderate facet and ligament flavum  hypertrophy. Mild prominence of the dorsal epidural fat. Resultant moderate canal with severe bilateral subarticular stenosis. Severe left with moderate right L4 foraminal narrowing. L5-S1: Trace anterolisthesis. Mild disc bulge with disc desiccation. Endplate osteophytic spurring. Severe bilateral facet arthrosis. Complex synovial cyst measuring 12 x 10 x 21 mm seen at the medial aspect of the right L5-S1 facet, encroaching upon the right lateral recess (series 12, image 33). Resultant moderate right lateral recess stenosis with displacement of the descending right S2 nerve root. Thecal sac remains patent. Initial 12 mm synovial cyst seen at the anterior aspect of the right L5-S1 facet at the level of the right neural foramen (series 12, image 30). Resultant mild left with moderate right L5 foraminal stenosis. IMPRESSION: 1. Multifactorial degenerative changes at L2-3 through L4-5 with resultant severe canal with bilateral subarticular stenosis as above, most pronounced at L3-4. 2. Two separate synovial cysts measuring up to 21 mm about the right L5-S1 facet with resultant moderate right  foraminal and right lateral recess stenosis as above. 3. Moderate to severe bilateral L2 through L5 foraminal stenosis as detailed above. Electronically Signed   By: Rise Mu M.D.   On: 01/14/2020 19:42   DG Chest Portable 1 View  Result Date: 01/14/2020 CLINICAL DATA:  Weakness. EXAM: PORTABLE CHEST 1 VIEW COMPARISON:  None. FINDINGS: Upper normal heart size. Normal mediastinal contours. Coronary artery calcifications versus stent. Streaky and bandlike opacities at the left lung base. No confluent consolidation. No pulmonary edema, pneumothorax, or large pleural effusion. No acute osseous abnormalities are seen. IMPRESSION: Streaky and bandlike opacities at the left lung base, favor atelectasis. Electronically Signed   By: Narda Rutherford M.D.   On: 01/14/2020 18:36     ____________________________________________   PROCEDURES  Procedure(s) performed (including Critical Care): Critical care time half an hour.  I discussed the patient with his son on the phone and with his wife in person.  He is having gradually worsening weakness.  He is having difficulty being cared for at home currently.  Family would like him to try to get more aggressive rehab.  Additionally the MRI shows severe spinal stenosis.  I spoke with Dr. Adriana Simas neurosurgery who looked at the films and said it is possible that surgery may benefit him although it would not be an emergent situation.  I then discussed the case with Dr. Emmit Pomfret the hospitalist.  Procedures   ____________________________________________   INITIAL IMPRESSION / ASSESSMENT AND PLAN / ED COURSE  We will attempt to admit the patient and consult social work and case management to see if we can get him more rehab.  Dr. Adriana Simas will evaluate him for the possibility that neurosurgery may perhaps benefit him.  Medicine will evaluate him as well.             ____________________________________________   FINAL CLINICAL IMPRESSION(S) / ED DIAGNOSES  Final diagnoses:  Weakness  Spinal stenosis of lumbar region, unspecified whether neurogenic claudication present  History of progressive weakness     ED Discharge Orders    None      *Please note:  Quanah Majka. was evaluated in Emergency Department on 01/14/2020 for the symptoms described in the history of present illness. He was evaluated in the context of the global COVID-19 pandemic, which necessitated consideration that the patient might be at risk for infection with the SARS-CoV-2 virus that causes COVID-19. Institutional protocols and algorithms that pertain to the evaluation of patients at risk for COVID-19 are in a state of rapid change based on information released by regulatory bodies including the CDC and federal and state organizations. These  policies and algorithms were followed during the patient's care in the ED.  Some ED evaluations and interventions may be delayed as a result of limited staffing during and the pandemic.*   Note:  This document was prepared using Dragon voice recognition software and may include unintentional dictation errors.    Arnaldo Natal, MD 01/14/20 2049

## 2020-01-15 ENCOUNTER — Inpatient Hospital Stay (HOSPITAL_COMMUNITY)
Admit: 2020-01-15 | Discharge: 2020-01-15 | Disposition: A | Discharge status: Home / Self Care | Payer: Medicare HMO | Attending: Family Medicine | Admitting: Family Medicine

## 2020-01-15 ENCOUNTER — Encounter: Payer: Self-pay | Admitting: Family Medicine

## 2020-01-15 ENCOUNTER — Inpatient Hospital Stay: Payer: Medicare HMO

## 2020-01-15 DIAGNOSIS — R531 Weakness: Secondary | ICD-10-CM | POA: Diagnosis not present

## 2020-01-15 DIAGNOSIS — Z87898 Personal history of other specified conditions: Secondary | ICD-10-CM

## 2020-01-15 DIAGNOSIS — R29898 Other symptoms and signs involving the musculoskeletal system: Secondary | ICD-10-CM

## 2020-01-15 DIAGNOSIS — R7989 Other specified abnormal findings of blood chemistry: Secondary | ICD-10-CM

## 2020-01-15 DIAGNOSIS — M48061 Spinal stenosis, lumbar region without neurogenic claudication: Secondary | ICD-10-CM | POA: Diagnosis not present

## 2020-01-15 DIAGNOSIS — I10 Essential (primary) hypertension: Secondary | ICD-10-CM

## 2020-01-15 DIAGNOSIS — R778 Other specified abnormalities of plasma proteins: Secondary | ICD-10-CM

## 2020-01-15 LAB — ECHOCARDIOGRAM COMPLETE
AR max vel: 2.23 cm2
AV Area VTI: 1.95 cm2
AV Area mean vel: 2 cm2
AV Mean grad: 4 mmHg
AV Peak grad: 8 mmHg
Ao pk vel: 1.41 m/s
Area-P 1/2: 6.96 cm2
Height: 75 in
S' Lateral: 4.35 cm
Weight: 4032 oz

## 2020-01-15 LAB — GLUCOSE, CAPILLARY
Glucose-Capillary: 127 mg/dL — ABNORMAL HIGH (ref 70–99)
Glucose-Capillary: 187 mg/dL — ABNORMAL HIGH (ref 70–99)
Glucose-Capillary: 138 mg/dL — ABNORMAL HIGH (ref 70–99)
Glucose-Capillary: 154 mg/dL — ABNORMAL HIGH (ref 70–99)
Glucose-Capillary: 215 mg/dL — ABNORMAL HIGH (ref 70–99)

## 2020-01-15 LAB — CBC
HCT: 46 % (ref 39.0–52.0)
Hemoglobin: 15.5 g/dL (ref 13.0–17.0)
MCH: 32 pg (ref 26.0–34.0)
MCHC: 33.7 g/dL (ref 30.0–36.0)
MCV: 95 fL (ref 80.0–100.0)
Platelets: 181 10*3/uL (ref 150–400)
RBC: 4.84 MIL/uL (ref 4.22–5.81)
RDW: 12.6 % (ref 11.5–15.5)
WBC: 8.9 10*3/uL (ref 4.0–10.5)
nRBC: 0 % (ref 0.0–0.2)

## 2020-01-15 LAB — BASIC METABOLIC PANEL
Anion gap: 11 (ref 5–15)
BUN: 19 mg/dL (ref 8–23)
CO2: 27 mmol/L (ref 22–32)
Calcium: 9.7 mg/dL (ref 8.9–10.3)
Chloride: 104 mmol/L (ref 98–111)
Creatinine, Ser: 0.93 mg/dL (ref 0.61–1.24)
GFR calc Af Amer: >60 mL/min (ref >60)
GFR calc non Af Amer: >60 mL/min (ref >60)
Glucose, Bld: 118 mg/dL — ABNORMAL HIGH (ref 70–99)
Potassium: 3.6 mmol/L (ref 3.5–5.1)
Sodium: 142 mmol/L (ref 135–145)

## 2020-01-15 LAB — TROPONIN I (HIGH SENSITIVITY): Troponin I (High Sensitivity): 34 ng/L — ABNORMAL HIGH (ref <18)

## 2020-01-15 IMAGING — MR MR THORACIC SPINE W/O CM
6 series · 34 of 48 positions shown · non-contrast
Comparison: Lumbar study done yesterday.

CLINICAL DATA: Acute presentation with bilateral lower extremity
weakness. Myelopathy.

EXAM:
MRI THORACIC SPINE WITHOUT CONTRAST
TECHNIQUE: Multiplanar, multisequence MR imaging of the thoracic spine was
performed. No intravenous contrast was administered.

[Series 16: T1 · sagittal · 5.0mm · 1.88mm/px · 4 of 9 slices shown (1 of 2)]
[im 1/9]
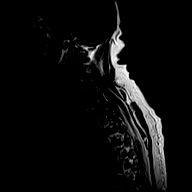
[im 3/9]
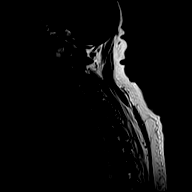
[im 6/9]
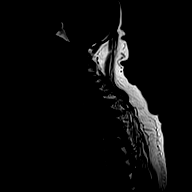
[im 9/9]
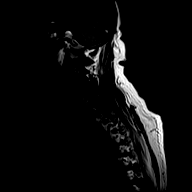

[Series 17: T2 · sagittal · 3.0mm · 1.33mm/px · 6 of 19 slices shown (1 of 2)]
[im 1/19]
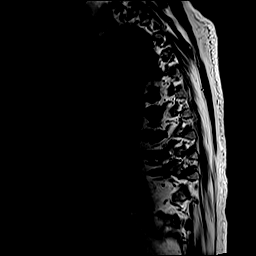
[im 4/19]
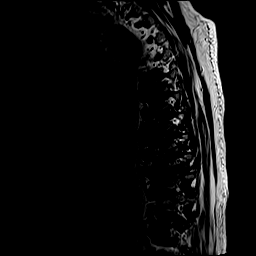
[im 8/19]
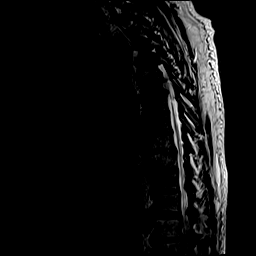
[im 11/19]
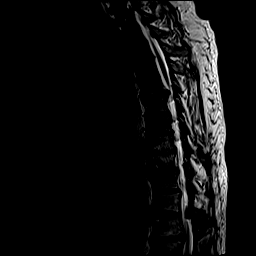
[im 15/19]
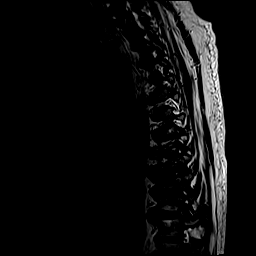
[im 19/19]
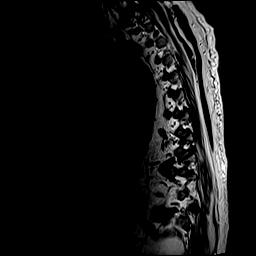

[Series 18: T1 · sagittal · 3.0mm · 1.33mm/px · 6 of 19 slices shown (2 of 2)]
[im 1/19]
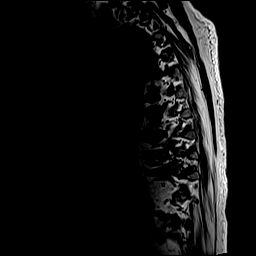
[im 4/19]
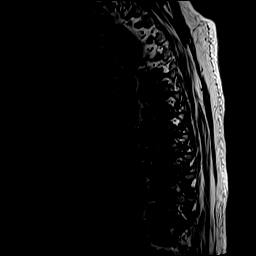
[im 8/19]
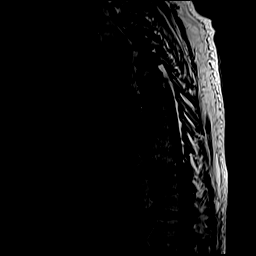
[im 11/19]
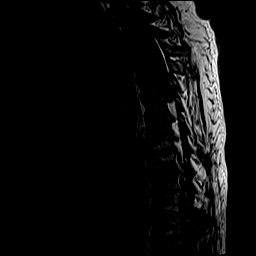
[im 15/19]
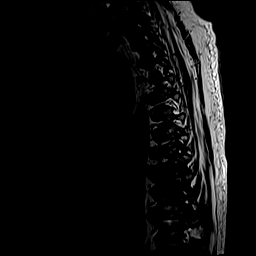
[im 19/19]
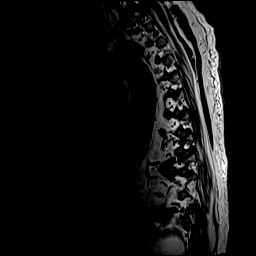

[Series 19: STIR · sagittal · 3.0mm · 0.66mm/px · 6 of 19 slices shown]
[im 1/19]
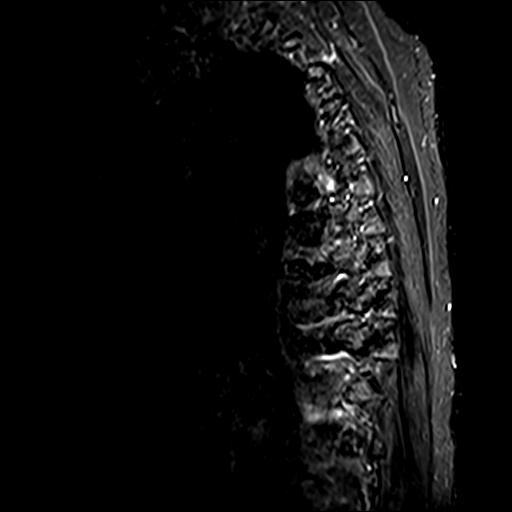
[im 4/19]
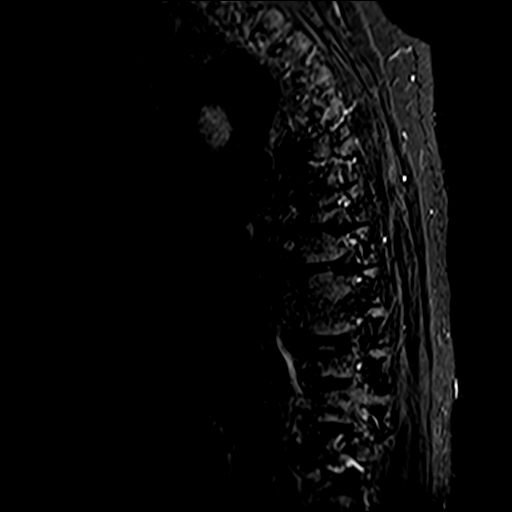
[im 8/19]
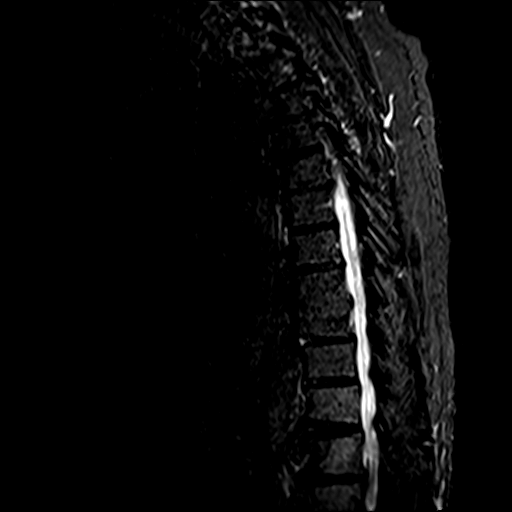
[im 11/19]
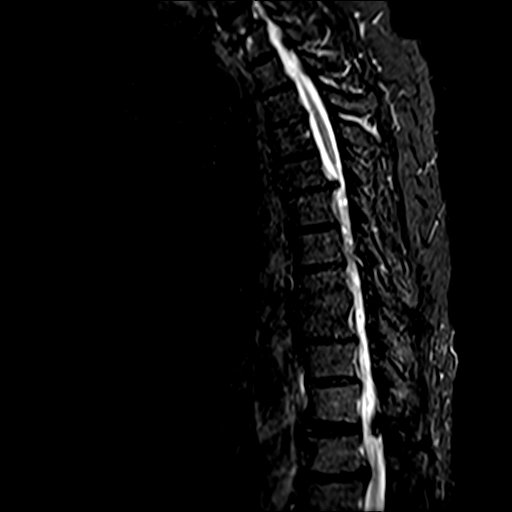
[im 15/19]
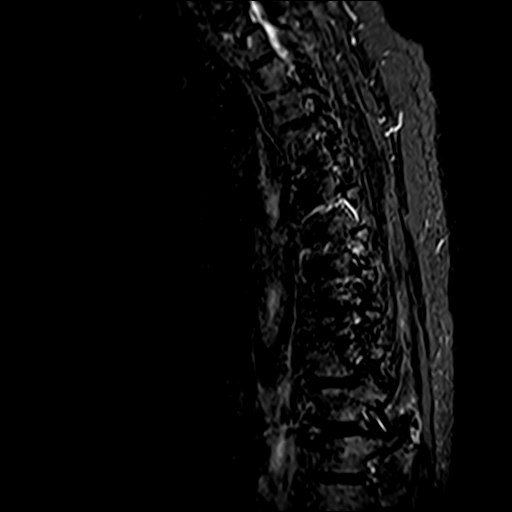
[im 19/19]
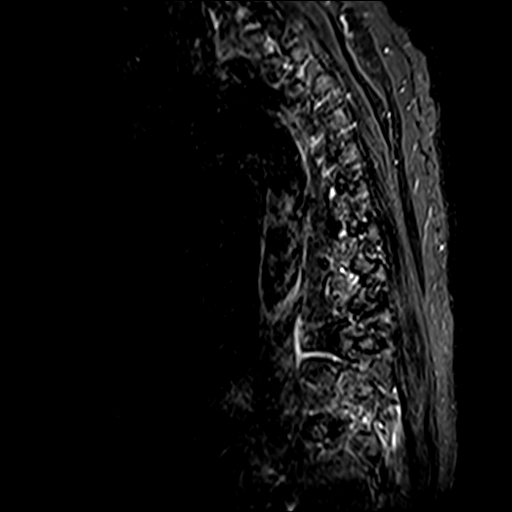

[Series 20: T2 · axial · 4.0mm · 0.59mm/px · z∈[-341,-79]mm · 9 of 40 slices shown (2 of 2)]
[im 1/40]
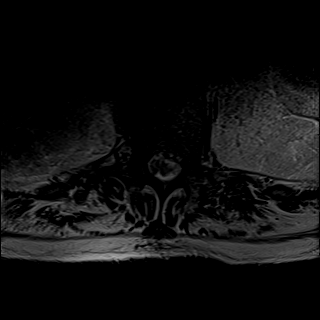
[im 7/40]
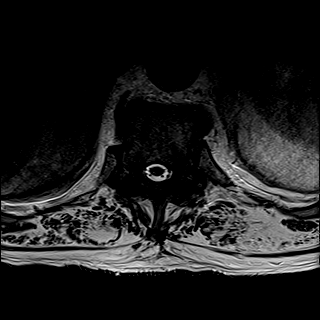
[im 14/40]
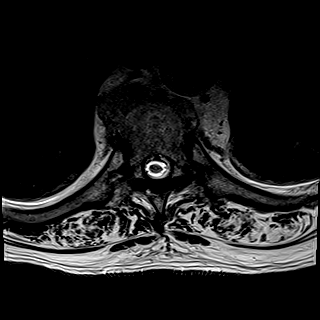
[im 17/40]
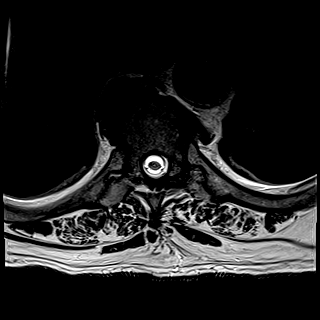
[im 20/40]
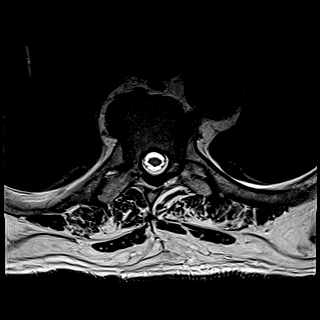
[im 23/40]
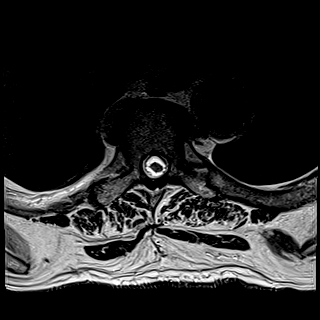
[im 27/40]
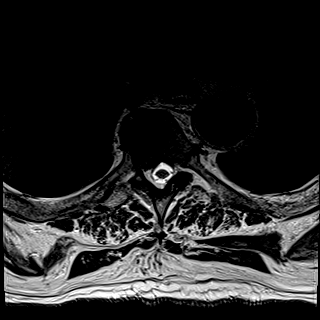
[im 33/40]
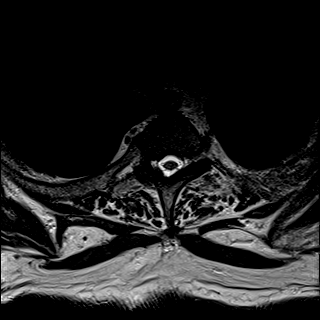
[im 40/40]
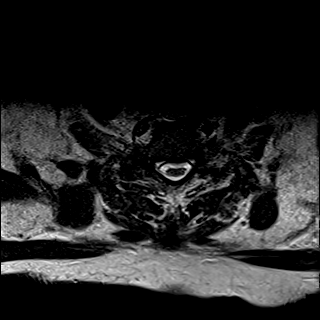

[Series 21: GRE · axial · 4.0mm · 0.37mm/px · z∈[-341,-235]mm · 3 of 40 slices shown]
[im 1/40]
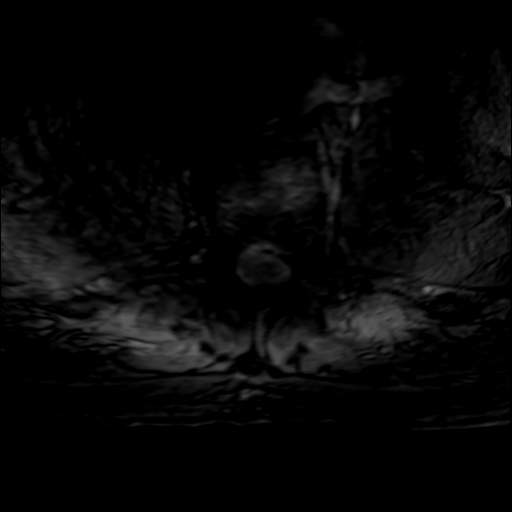
[im 7/40]
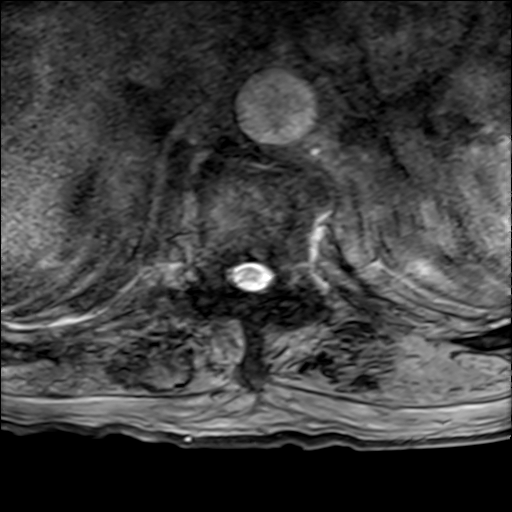
[im 14/40]
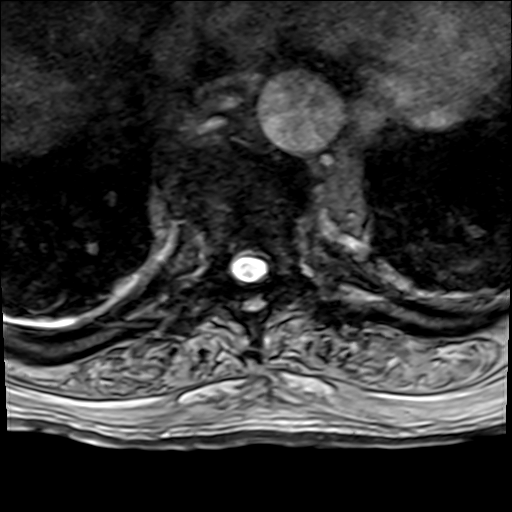

[34 of 48 positions shown; findings below may reference images not displayed]

FINDINGS: Alignment: No significant thoracic malalignment. Mild scoliotic
curvature.

Vertebrae: Chronic fusion at T8 and T9. No evidence of regional
fracture or acute bone pathology.

Cord: No primary cord lesion. See below regarding degenerative
findings.

Paraspinal and other soft tissues: Negative

Disc levels:

There is cervical spondylosis, not studied in any complete fashion.

C7-T1: Endplate osteophytes and bulging of the disc. Narrowing of
the ventral subarachnoid space but no compressive canal stenosis.
Bilateral bony foraminal stenosis.

T1-2: Mild disc bulge and facet hypertrophy. No compressive
stenosis.

T2-3: Mild disc bulge and facet hypertrophy. No compressive
stenosis.

T3-4: Disc bulge and endplate osteophytes more prominent towards the
right. Narrowing of the ventral subarachnoid space on the right but
no cord compression. Bilateral facet degeneration. Mild right
foraminal stenosis.

T4-5: Endplate osteophytes and bulging of the disc. No compressive
canal stenosis. Foramina sufficiently patent.

T5-6: Disc herniation in both the right posterolateral and left
posterolateral direction, more prominent towards the left.
Effacement of the ventral subarachnoid space and some
deformity/flattening of the ventral cord. Subarachnoid space remains
present dorsal to the cord however. Bilateral foraminal stenosis.
Findings at this level could be symptomatic.

T6-7: Disc bulge.  No compressive canal or foraminal stenosis.

T7-8: Disc bulge.  No compressive canal or foraminal stenosis per

T8-9: Chronic fusion.  Sufficient patency of the canal and foramina.

T9-10: Mild bulging of the disc. Bilateral facet degeneration and
hypertrophy. Mild canal stenosis without cord compression or
deformity. Bilateral foraminal narrowing.

T10-11: Bulging of the disc. Bilateral facet degeneration and
hypertrophy. Mild canal narrowing but no compression of the cord.
Bilateral foraminal stenosis left worse than right. Either T10 nerve
could be affected, particularly the left.

T11-12: Bulging of the disc towards the left. Pronounced facet
arthropathy. Canal narrowing without cord compression. Bilateral
foraminal stenosis left more than right. Either T11 nerve could be
affected, particularly the left.

T12-L1: Endplate osteophytes and bulging of the disc. No compressive
canal or foraminal stenosis.
IMPRESSION: 1. No acute fracture in the thoracic region. Chronic fusion at T8
and T9.
2. Degenerative changes throughout the thoracic region as outlined
above. At T5-6, there are bilateral posterolateral disc herniations
more prominent towards the left. Effacement of the ventral
subarachnoid space with mild cord deformity. Subarachnoid space
remains present dorsal to the cord however. Bilateral foraminal
stenosis. No other level shows any deformation of the cord.
3. At T9-10, there is facet arthropathy with bilateral foraminal
stenosis.
4. At T10-11, there is bulging of the disc and facet arthropathy
with foraminal stenosis left worse than right.
5. At T11-12, there is bulging of the disc and facet arthropathy
with foraminal stenosis left worse than right.

## 2020-01-15 MED ORDER — CARVEDILOL 6.25 MG PO TABS
6.2500 mg | ORAL_TABLET | Freq: Two times a day (BID) | ORAL | Status: DC
  Administered 2020-01-15 – 2020-01-25 (×21): 6.25 mg via ORAL
  Filled 2020-01-15 (×21): qty 1

## 2020-01-15 MED ORDER — CARVEDILOL 25 MG PO TABS
25.0000 mg | ORAL_TABLET | Freq: Two times a day (BID) | ORAL | Status: DC
  Filled 2020-01-15: qty 1

## 2020-01-15 MED ORDER — PERFLUTREN LIPID MICROSPHERE
1.0000 mL | INTRAVENOUS | Status: AC | PRN
  Administered 2020-01-15: 3 mL via INTRAVENOUS
  Filled 2020-01-15: qty 10

## 2020-01-15 NOTE — Consult Note (Signed)
Neurosurgery-New Consultation Evaluation 01/15/2020 Dru H Straley Jr. [id number]  Identifying Statement: Roy H Silguero Jr. is a 74 y.o. male from [place] [address] with leg weakness  Physician Requesting Consultation: Dr. Malinda, ED  History of Present Illness: Roy [name] is a 74-year-old gentleman that presented to the emergency department with concern for worsening weakness in his lower extremities.  He has been getting home physical therapy for this but does feel that he has not been improving.  Yesterday, he was noted to have worsening weakness to the point where he could not stand from the toilet.  He does note that he has some difficulty with his left knee.  He was evaluated for possible surgery there but they felt he was not a great candidate at this time.  He has had a previous right total knee replacement.  He does use a walker at home.  He does not endorse any worsening numbness but he does have some numbness at baseline in his legs.  He does not endorse any pain going down his legs.  He did get an odd electrical-like sensation yesterday.  As part of the work-up, a MRI of the lumbar spine was ordered which did reveal some stenosis.  He additionally had some concern for increased cardiac enzymes and was continued on aspirin.  Given the lumbar stenosis, neurosurgery is consulted.  Past Medical History:  Past Medical History:  Diagnosis Date   Diabetes mellitus without complication (HCC)    Hypertension     Social History: Social History   Socioeconomic History   Marital status: Unknown    Spouse name: Not on file   Number of children: Not on file   Years of education: Not on file   Highest education level: Not on file  Occupational History   Not on file  Tobacco Use   Smoking status: Never Smoker  Substance and Sexual Activity   Alcohol use: No   Drug use: Not on file   Sexual activity: Not on file  Other Topics Concern   Not on file  Social History Narrative    Not on file   Social Determinants of Health   Financial Resource Strain:    Difficulty of Paying Living Expenses: Not on file  Food Insecurity:    Worried About Running Out of Food in the Last Year: Not on file   Ran Out of Food in the Last Year: Not on file  Transportation Needs:    Lack of Transportation (Medical): Not on file   Lack of Transportation (Non-Medical): Not on file  Physical Activity:    Days of Exercise per Week: Not on file   Minutes of Exercise per Session: Not on file  Stress:    Feeling of Stress : Not on file  Social Connections:    Frequency of Communication with Friends and Family: Not on file   Frequency of Social Gatherings with Friends and Family: Not on file   Attends Religious Services: Not on file   Active Member of Clubs or Organizations: Not on file   Attends Club or Organization Meetings: Not on file   Marital Status: Not on file  Intimate Partner Violence:    Fear of Current or Ex-Partner: Not on file   Emotionally Abused: Not on file   Physically Abused: Not on file   Sexually Abused: Not on file     Family History: History reviewed. No pertinent family history.  Review of Systems:  Review of Systems - General ROS: Negative   Psychological ROS: Negative Ophthalmic ROS: Negative ENT ROS: Negative Hematological and Lymphatic ROS: Negative  Endocrine ROS: Negative Respiratory ROS: Negative Cardiovascular ROS: Negative Gastrointestinal ROS: Negative Genito-Urinary ROS: Negative Musculoskeletal ROS: Positive for back pain Neurological ROS: Positive for weakness Dermatological ROS: Negative  Physical Exam: BP (!) 160/92 (BP Location: Left Arm)   Pulse 63   Temp 97.9 F (36.6 C) (Oral)   Resp 17   Ht 6\' 3"  (1.905 m)   Wt 114.3 kg   SpO2 96%   BMI 31.50 kg/m  Body mass index is 31.5 kg/m. Body surface area is 2.46 meters squared. General appearance: Alert, cooperative, in no acute distress Head: Normocephalic, atraumatic Eyes:  Normal, EOM intact Ext: Mild edema, discoloration in lower extremities  Neurologic exam:  Mental status: alertness: alert, affect: normal Speech: fluent and clear Motor:strength 5 out of 5 in bilateral hip flexion, knee extension, plantarflexion.  He is 4+ out of 5 in left dorsiflexion and 5 out of 5 in right Sensory: Symmetric light touch sensation in bilateral lower extremities Reflexes: 1+ at bilateral patella, negative clonus at ankle Gait: Not tested  Laboratory: Results for orders placed or performed during the hospital encounter of 01/14/20  Respiratory Panel by RT PCR (Flu A&B, Covid) - Nasopharyngeal Swab   Specimen: Nasopharyngeal Swab  Result Value Ref Range   SARS Coronavirus 2 by RT PCR NEGATIVE NEGATIVE   Influenza A by PCR NEGATIVE NEGATIVE   Influenza B by PCR NEGATIVE NEGATIVE  Basic metabolic panel  Result Value Ref Range   Sodium 141 135 - 145 mmol/L   Potassium 3.9 3.5 - 5.1 mmol/L   Chloride 104 98 - 111 mmol/L   CO2 27 22 - 32 mmol/L   Glucose, Bld 133 (H) 70 - 99 mg/dL   BUN 20 8 - 23 mg/dL   Creatinine, Ser 03/15/20 0.61 - 1.24 mg/dL   Calcium 9.9 8.9 - 5.57 mg/dL   GFR calc non Af Amer >60 >60 mL/min   GFR calc Af Amer >60 >60 mL/min   Anion gap 10 5 - 15  CBC  Result Value Ref Range   WBC 9.9 4.0 - 10.5 K/uL   RBC 4.95 4.22 - 5.81 MIL/uL   Hemoglobin 15.6 13.0 - 17.0 g/dL   HCT 32.2 39 - 52 %   MCV 95.2 80.0 - 100.0 fL   MCH 31.5 26.0 - 34.0 pg   MCHC 33.1 30.0 - 36.0 g/dL   RDW 02.5 42.7 - 06.2 %   Platelets 197 150 - 400 K/uL   nRBC 0.0 0.0 - 0.2 %  Urinalysis, Complete w Microscopic  Result Value Ref Range   Color, Urine YELLOW (A) YELLOW   APPearance HAZY (A) CLEAR   Specific Gravity, Urine 1.017 1.005 - 1.030   pH 5.0 5.0 - 8.0   Glucose, UA NEGATIVE NEGATIVE mg/dL   Hgb urine dipstick NEGATIVE NEGATIVE   Bilirubin Urine NEGATIVE NEGATIVE   Ketones, ur 20 (A) NEGATIVE mg/dL   Protein, ur 37.6 (A) NEGATIVE mg/dL   Nitrite NEGATIVE  NEGATIVE   Leukocytes,Ua SMALL (A) NEGATIVE   RBC / HPF 0-5 0 - 5 RBC/hpf   WBC, UA 0-5 0 - 5 WBC/hpf   Bacteria, UA NONE SEEN NONE SEEN   Squamous Epithelial / LPF 0-5 0 - 5   Mucus PRESENT    Hyaline Casts, UA PRESENT   Magnesium  Result Value Ref Range   Magnesium 1.7 1.7 - 2.4 mg/dL  Phosphorus  Result Value  Ref Range   Phosphorus 2.7 2.5 - 4.6 mg/dL  Basic metabolic panel  Result Value Ref Range   Sodium 142 135 - 145 mmol/L   Potassium 3.6 3.5 - 5.1 mmol/L   Chloride 104 98 - 111 mmol/L   CO2 27 22 - 32 mmol/L   Glucose, Bld 118 (H) 70 - 99 mg/dL   BUN 19 8 - 23 mg/dL   Creatinine, Ser 6.72 0.61 - 1.24 mg/dL   Calcium 9.7 8.9 - 09.4 mg/dL   GFR calc non Af Amer >60 >60 mL/min   GFR calc Af Amer >60 >60 mL/min   Anion gap 11 5 - 15  CBC  Result Value Ref Range   WBC 8.9 4.0 - 10.5 K/uL   RBC 4.84 4.22 - 5.81 MIL/uL   Hemoglobin 15.5 13.0 - 17.0 g/dL   HCT 70.9 39 - 52 %   MCV 95.0 80.0 - 100.0 fL   MCH 32.0 26.0 - 34.0 pg   MCHC 33.7 30.0 - 36.0 g/dL   RDW 62.8 36.6 - 29.4 %   Platelets 181 150 - 400 K/uL   nRBC 0.0 0.0 - 0.2 %  TSH  Result Value Ref Range   TSH 1.101 0.350 - 4.500 uIU/mL  Lipid panel  Result Value Ref Range   Cholesterol 177 0 - 200 mg/dL   Triglycerides 765 <465 mg/dL   HDL 59 >03 mg/dL   Total CHOL/HDL Ratio 3.0 RATIO   VLDL 23 0 - 40 mg/dL   LDL Cholesterol 95 0 - 99 mg/dL  Glucose, capillary  Result Value Ref Range   Glucose-Capillary 126 (H) 70 - 99 mg/dL  Glucose, capillary  Result Value Ref Range   Glucose-Capillary 127 (H) 70 - 99 mg/dL   Comment 1 Notify RN   Troponin I (High Sensitivity)  Result Value Ref Range   Troponin I (High Sensitivity) 22 (H) <18 ng/L  Troponin I (High Sensitivity)  Result Value Ref Range   Troponin I (High Sensitivity) 31 (H) <18 ng/L  Troponin I (High Sensitivity)  Result Value Ref Range   Troponin I (High Sensitivity) 30 (H) <18 ng/L  Troponin I (High Sensitivity)  Result Value Ref Range    Troponin I (High Sensitivity) 34 (H) <18 ng/L   I personally reviewed labs  Imaging: MRI lumbar spine:1. Multifactorial degenerative changes at L2-3 through L4-5 with resultant severe canal with bilateral subarticular stenosis as above, most pronounced at L3-4. 2. 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 3. Moderate to severe bilateral L2 through L5 foraminal stenosis as detailed above.   Impression/Plan:  Roy [name] is here for concerns for lower extremity weakness.  On my exam, he is full strength and I do think this is functional weakness which could be related to the lumbar stenosis.  We did discuss the symptoms of neurogenic claudication but given his concern for blood flow in his lower extremities, there is a possibility of vascular component.  We would recommend an ABI for this.  Given that both legs are affected, I would like to get an MRI of the thoracic spine to rule out any etiology of his symptoms there.  Lastly, we would need him clear for any possible surgery from medical standpoint given the cardiac enzyme abnormality as well as underlying comorbidities including diabetes.  I did discuss with the patient and his wife that given the  longstanding diabetes and peripheral neuropathy, I cannot say definitively that the lumbar stenosis is causing the symptoms but we could discuss intervention if medically cleared and we rule out other possible etiologies.  The patient did express also history of carpal tunnel syndrome on the left which appears to be worsening.  He is wearing a wrist sling at night which appears to help.  He did have an injection which was not very helpful.  He is functionally left-handed and this is affecting him.   1.  Diagnosis  Lumbar stenosis Left carpal tunnel syndrome  2.  Plan MRI thoracic spine ordered Would recommend ABI to rule out  lower extremity vascular claudication We will need medical clearance for any possible intervention to include cardiac evaluation

## 2020-01-15 NOTE — Plan of Care (Signed)

## 2020-01-15 NOTE — Progress Notes (Signed)
PROGRESS NOTE    Patient: Roy [name].                            PCP: Patient, No Pcp Per                    DOB: 1946-03-09            DOA: 01/14/2020 XBJ:[id number]             DOS: 01/15/2020, 7:34 AM   LOS: 1 day   Date of Service: The patient was seen and examined on 01/15/2020  Subjective:   The patient was seen and examined this Am. Stable - pain lower extremity weakness unable to ambulate Otherwise no issues overnight . Wife present at bedside  Brief Narrative:   Per HPI:  Roy [name] is a 74 y.o. mw h/o DM, HTN,  presents to ED forevaluation of bilateral lower extremity weakness.  Patient attempted to use the bathroom this morning but was so weak in his lower extremities that he could not stand from the toilet.  He reports severe progressively worsening bilateral lower extremity weakness despite getting home physical therapy.  Patient reports associated low back pain which radiates down the backs of both legs. .    EMS/ED Course: [facility] admission has been requested for further management of bilateral lower extremity weakness, for likely spinal stenosis, also noted elevated troponin of unclear significance, without chest pain.   Assessment & Plan:   Active Problems:   Lower extremity weakness    Bilateral lower extremity weakness with gait instability affecting activities of daily living likely secondary to severe lumbar spinal stenosis -versus neuropathy, vasculopathy -We will continue to monitor patient closely -We will follow up an MRI spine per neurosurgery recommendation -PT/OT consulted -Pain management -Neurosurgery and Dr. Adriana Simas has been consulted admission appreciate input >>     Elevated troponin- -denies any chest pain, unknown etiology  -Trending troponin  -Continue monitoring, aspirin, as needed nitroglycerin, -We will obtain 2D echocardiogram -also would help evaluation possible preop..  -We will monitor on the monitor bed, EKG as  needed  Diabetes mellitus type 2 -Checking CBG QA CHS, SSI coverage - Heart healthy, carb controlled diet -Hold glipizide  Hypertension -Remained stable, monitoring -Continue Coreg -As needed hydralazine  Hyperlipidemia - Continue Zocor -Stable  Cultures; NONE   Antimicrobials: NONE     Consultants: Neurosurgery Dr. Adriana Simas   --------------------------------------------------------------------------------------------------------------------------------------------- DVT prophylaxis:  SCD/Compression stockings, Lovenox Code Status:   Code Status: Full Code Family Communication: Discussed with patient and his wife at bedside The above findings and plan of care has been discussed with patient (wife)  in detail,  they expressed understanding and agreement of above. -Advance care planning has been discussed.   Admission status:    Status is: Inpatient  Remains inpatient appropriate because:Inpatient level of care appropriate due to severity of illness   Dispo: The patient is from: Home              Anticipated d/c is to: Home with home health versus SNF              Anticipated d/c date is: 3 days              Patient currently is not medically stable to d/c.        Procedures:   No admission procedures for hospital encounter.     Antimicrobials:  Anti-infectives (From  admission, onward)   None       Medication:  . aspirin  324 mg Oral Once  . carvedilol  12.5 mg Oral BID WC  . enoxaparin (LOVENOX) injection  40 mg Subcutaneous Q24H  . insulin aspart  0-15 Units Subcutaneous TID WC  . insulin aspart  0-5 Units Subcutaneous QHS  . simvastatin  20 mg Oral Daily    acetaminophen **OR** acetaminophen, albuterol, bisacodyl, ipratropium, morphine injection, ondansetron **OR** ondansetron (ZOFRAN) IV, senna-docusate, traMADol   Objective:   Vitals:   01/15/20 0221 01/15/20 0351 01/15/20 0607 01/15/20 0614  BP: 109/61 (!) 169/109  (!) 160/92   Pulse: 78 79 63 63  Resp: 16 (!) 26 17 17   Temp: 98.2 F (36.8 C) 97.9 F (36.6 C)    TempSrc: Oral Oral    SpO2: 100% 98% 96% 96%  Weight:      Height:       No intake or output data in the 24 hours ending 01/15/20 0734 Filed Weights   01/14/20 1137  Weight: 114.3 kg     Examination:   Physical Exam  Constitution:  Alert, cooperative, no distress,  Appears calm and comfortable  Psychiatric: Normal and stable mood and affect, cognition intact,   HEENT: Normocephalic, PERRL, otherwise with in Normal limits  Chest:Chest symmetric Cardio vascular:  S1/S2, RRR, No murmure, No Rubs or Gallops  pulmonary: Clear to auscultation bilaterally, respirations unlabored, negative wheezes / crackles Abdomen: Soft, non-tender, non-distended, bowel sounds,no masses, no organomegaly Muscular skeletal:  Severe bilateral lower extremity weakness  Limited exam - in bed, able to move all 4 extremities, Normal strength,  Neuro: CNII-XII intact. , normal motor and sensation, reflexes intact  Extremities: No pitting edema lower extremities, +2 pulses  Skin: Dry, warm to touch, negative for any Rashes, No open wounds Wounds: per nursing documentation    ------------------------------------------------------------------------------------------------------------------------------------------    LABs:  CBC Latest Ref Rng & Units 01/15/2020 01/14/2020  WBC 4.0 - 10.5 K/uL 8.9 9.9  Hemoglobin 13.0 - 17.0 g/dL 03/15/2020 79.8  Hematocrit 39 - 52 % 46.0 47.1  Platelets 150 - 400 K/uL 181 197   CMP Latest Ref Rng & Units 01/15/2020 01/14/2020  Glucose 70 - 99 mg/dL 03/15/2020) 194(R)  BUN 8 - 23 mg/dL 19 20  Creatinine 740(C - 1.24 mg/dL 1.44 8.18  Sodium 5.63 - 145 mmol/L 142 141  Potassium 3.5 - 5.1 mmol/L 3.6 3.9  Chloride 98 - 111 mmol/L 104 104  CO2 22 - 32 mmol/L 27 27  Calcium 8.9 - 10.3 mg/dL 9.7 9.9       Micro Results Recent Results (from the past 240 hour(s))  Respiratory Panel by RT PCR (Flu  A&B, Covid) - Nasopharyngeal Swab     Status: None   Collection Time: 01/14/20  8:52 PM   Specimen: Nasopharyngeal Swab  Result Value Ref Range Status   SARS Coronavirus 2 by RT PCR NEGATIVE NEGATIVE Final    Comment: (NOTE) SARS-CoV-2 target nucleic acids are NOT DETECTED.  The SARS-CoV-2 RNA is generally detectable in upper respiratoy specimens during the acute phase of infection. The lowest concentration of SARS-CoV-2 viral copies this assay can detect is 131 copies/mL. A negative result does not preclude SARS-Cov-2 infection and should not be used as the sole basis for treatment or other patient management decisions. A negative result may occur with  improper specimen collection/handling, submission of specimen other than nasopharyngeal swab, presence of viral mutation(s) within the areas targeted by this assay, and  inadequate number of viral copies (<131 copies/mL). A negative result must be combined with clinical observations, patient history, and epidemiological information. The expected result is Negative.  Fact Sheet for Patients:  https://www.moore.com/  Fact Sheet for Healthcare Providers:  https://www.young.biz/  This test is no t yet approved or cleared by the [place] FDA and  has been authorized for detection and/or diagnosis of SARS-CoV-2 by FDA under an Emergency Use Authorization (EUA). This EUA will remain  in effect (meaning this test can be used) for the duration of the COVID-19 declaration under Section 564(b)(1) of the Act, 21 U.S.C. section 360bbb-3(b)(1), unless the authorization is terminated or revoked sooner.     Influenza A by PCR NEGATIVE NEGATIVE Final   Influenza B by PCR NEGATIVE NEGATIVE Final    Comment: (NOTE) The Xpert Xpress SARS-CoV-2/FLU/RSV assay is intended as an aid in  the diagnosis of influenza from Nasopharyngeal swab specimens and  should not be used as a sole basis for treatment. Nasal  washings and  aspirates are unacceptable for Xpert Xpress SARS-CoV-2/FLU/RSV  testing.  Fact Sheet for Patients: https://www.moore.com/  Fact Sheet for Healthcare Providers: https://www.young.biz/  This test is not yet approved or cleared by the [place] FDA and  has been authorized for detection and/or diagnosis of SARS-CoV-2 by  FDA under an Emergency Use Authorization (EUA). This EUA will remain  in effect (meaning this test can be used) for the duration of the  Covid-19 declaration under Section 564(b)(1) of the Act, 21  U.S.C. section 360bbb-3(b)(1), unless the authorization is  terminated or revoked. Performed at [facility], [address] [place], [place] [address]     Radiology Reports CT Head Wo Contrast  Result Date: 01/14/2020 CLINICAL DATA:  Head trauma EXAM: CT HEAD WITHOUT CONTRAST TECHNIQUE: Contiguous axial images were obtained from the base of the skull through the vertex without intravenous contrast. COMPARISON:  None. FINDINGS: Brain: No acute infarct or intracranial hemorrhage. No mass lesion. No midline shift, ventriculomegaly or extra-axial fluid collection. Mild chronic microvascular ischemic changes. Cerebral volume is within normal limits for patient's age. Vascular: No hyperdense vessel or unexpected calcification. Bilateral skull base atherosclerotic calcifications. Skull: Negative for fracture or focal lesion. Sinuses/Orbits: Normal orbits. Clear paranasal sinuses. No mastoid effusion. Other: None. IMPRESSION: No acute intracranial process. Mild chronic microvascular ischemic changes. Electronically Signed   By: Stana Bunting M.D.   On: 01/14/2020 12:18   MR LUMBAR SPINE WO CONTRAST  Result Date: 01/14/2020 CLINICAL DATA:  Initial evaluation for low back pain with acute tingling in lower spine, weakness. EXAM: MRI LUMBAR SPINE WITHOUT CONTRAST TECHNIQUE: Multiplanar, multisequence MR imaging of the  lumbar spine was performed. No intravenous contrast was administered. COMPARISON:  None available. FINDINGS: Segmentation: Standard. Lowest well-formed disc space labeled the L5-S1 level. Alignment: Dextroscoliosis with apex at L2-3. Trace 2 mm anterolisthesis of L5 on S1, chronic and facet mediated. Vertebrae: Vertebral body height maintained without acute or chronic fracture. L2 and L3 vertebral bodies are partially ankylosed anteriorly, degenerative in nature. Bone marrow signal intensity diffusely heterogeneous without discrete or worrisome osseous lesions. No abnormal marrow edema. Conus medullaris and cauda equina: Conus extends to the L1 level. Conus and cauda equina appear normal. Paraspinal and other soft tissues: Paraspinous soft tissues demonstrate no acute finding. Disc levels: T12-L1: Disc desiccation with mild disc bulge. Prominent anterior endplate osteophytic spurring. Superimposed tiny right subarticular disc protrusion mildly indents the right ventral thecal sac. Mild facet hypertrophy. No significant canal or foraminal  stenosis. L1-2: Diffuse disc bulge with disc desiccation and intervertebral disc space narrowing. Prominent reactive endplate osteophytic spurring, most pronounced anteriorly and to the right. Disc bulge slightly asymmetric to the right. Mild facet hypertrophy. Mild narrowing of the right lateral recess without significant spinal stenosis. Foramina remain patent. L2-3: Severe degenerative intervertebral disc space narrowing with partial ankylosis of the L2 and L3 vertebral bodies. Associated pronounced circumferential reactive endplate osteophytic spurring and diffuse disc bulge. Moderate bilateral facet hypertrophy. Resultant severe spinal stenosis. Thecal sac measures 7-8 mm in AP diameter at its most narrow point. Moderate left L2 foraminal narrowing. Right neural foramen remains patent. L3-4: Degenerative intervertebral disc space narrowing with diffuse disc bulge and disc  desiccation. Prominent reactive endplate osteophytic spurring. Moderate to severe bilateral facet hypertrophy, left worse than right. Cystic degeneration at the left ligamentum flavum noted. Resultant severe spinal stenosis with near effacement of the thecal sac. Thecal sac measures 4-5 mm in AP diameter at its most narrow point. Mild-to-moderate bilateral L3 foraminal narrowing, right slightly worse than left. L4-5: Mild diffuse disc bulge with disc desiccation. Prominent reactive endplate osteophytic spurring. Moderate facet and ligament flavum hypertrophy. Mild prominence of the dorsal epidural fat. Resultant moderate canal with severe bilateral subarticular stenosis. Severe left with moderate right L4 foraminal narrowing. L5-S1: Trace anterolisthesis. Mild disc bulge with disc desiccation. Endplate osteophytic spurring. Severe bilateral facet arthrosis. Complex synovial cyst measuring 12 x 10 x 21 mm seen at the medial aspect of the right L5-S1 facet, encroaching upon the right lateral recess (series 12, image 33). Resultant moderate right lateral recess stenosis with displacement of the descending right S2 nerve root. Thecal sac remains patent. Initial 12 mm synovial cyst seen at the anterior aspect of the right L5-S1 facet at the level of the right neural foramen (series 12, image 30). Resultant mild left with moderate right L5 foraminal stenosis. IMPRESSION: 1. Multifactorial degenerative changes at L2-3 through L4-5 with resultant severe canal with bilateral subarticular stenosis as above, most pronounced at L3-4. 2. 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 3. Moderate to severe bilateral L2 through L5 foraminal stenosis as detailed above. Electronically Signed   By: Rise Mu M.D.   On: 01/14/2020 19:42   DG Chest Portable 1 View  Result Date: 01/14/2020 CLINICAL DATA:   Weakness. EXAM: PORTABLE CHEST 1 VIEW COMPARISON:  None. FINDINGS: Upper normal heart size. Normal mediastinal contours. Coronary artery calcifications versus stent. Streaky and bandlike opacities at the left lung base. No confluent consolidation. No pulmonary edema, pneumothorax, or large pleural effusion. No acute osseous abnormalities are seen. IMPRESSION: Streaky and bandlike opacities at the left lung base, favor atelectasis. Electronically Signed   By: Narda Rutherford M.D.   On: 01/14/2020 18:36    SIGNED: Kendell Bane, MD, FACP, FHM. [facility],  Pager (please use [email or web address] to page/text)  If 7PM-7AM, please contact night-coverage [email or web address] 01/15/2020, 7:34 AM

## 2020-01-15 NOTE — ED Notes (Signed)
Pt assisted to bedpan at this time.

## 2020-01-15 NOTE — Progress Notes (Signed)
PT Cancellation Note  Patient Details Name: Roy [name]. MRN: [id number] DOB: December 05, 1945   Cancelled Treatment:    Reason Eval/Treat Not Completed: Patient at procedure or test/unavailable. Patient went to MRI. Will re-attempt later as time allows.    Tad Fancher 01/15/2020, 9:48 AM

## 2020-01-15 NOTE — TOC Initial Note (Signed)
Transition of Care (TOC) - Initial/Assessment Note    Patient Details  Name: Roy [name]. MRN: [id number] Date of Birth: April 10, 1946  Transition of Care [facility]) CM/SW Contact:    Villa Herb, LCSWA Phone Number: 01/15/2020, 11:03 AM  Clinical Narrative:                 TOC attempted to complete pt assessment. ECHO currently in progress. TOC to follow.         Patient Goals and CMS Choice        Expected Discharge Plan and Services                                                Prior Living Arrangements/Services                       Activities of Daily Living      Permission Sought/Granted                  Emotional Assessment              Admission diagnosis:  Lower extremity weakness [R29.898] Patient Active Problem List   Diagnosis Date Noted  . Lower extremity weakness 01/14/2020   PCP:  Rigoberto Noel, MD Pharmacy:   [facility] [address], [place] - [address] [place] [address] Phone: [phone] Fax: [phone]     Social Determinants of Health (SDOH) Interventions    Readmission Risk Interventions No flowsheet data found.

## 2020-01-15 NOTE — TOC Initial Note (Signed)
Transition of Care (TOC) - Initial/Assessment Note    Patient Details  Name: Roy [name]. MRN: [id number] Date of Birth: [birth year]  Transition of Care [facility]) CM/SW Contact:    Villa Herb, LCSWA Phone Number: 01/15/2020, 2:57 PM  Clinical Narrative:                 Pt admitted for lower extremity weakness. TOC completed assessment with pts wife Myra. Pts wife states that pt does not drive and has difficulty completing ADLS. Per pts wife pt cannot bathe himself, needs assistance eating and dressing. Pt has had home health within the last couple of weeks. They had OT coming out once a week. The company coming out [facility]. Pts wife states that pt now cannot walk and that his weakness is progressing. Pts wife states that pt has 3 canes, 3 wheelchairs, a bed with a lift, a raised toilet seat, rails for the shower and toilet, and a couple shower chairs. Pt and pts wife are agreeable to SNF at d/c. Pts wife states that they want pt to go to [facility] in [place]. [facility] to follow.    Expected Discharge Plan: Skilled Nursing Facility Barriers to Discharge: Continued Medical Work up   Patient Goals and CMS Choice Patient states their goals for this hospitalization and ongoing recovery are:: SNF for rehab   Choice offered to / list presented to : NA  Expected Discharge Plan and Services Expected Discharge Plan: Skilled Nursing Facility In-house Referral: Clinical Social Work Discharge Planning Services: NA Post Acute Care Choice: Skilled Nursing Facility Living arrangements for the past 2 months: Single Family Home                 DME Arranged: N/A DME Agency: NA       HH Arranged: NA HH Agency: NA        Prior Living Arrangements/Services Living arrangements for the past 2 months: Single Family Home Lives with:: Spouse Patient language and need for interpreter reviewed:: Yes Do you feel safe going back to the place where you live?: Yes         Care giver support system in place?: Yes (comment) (Wife (Myra)) Current home services: Home OT Criminal Activity/Legal Involvement Pertinent to Current Situation/Hospitalization: No - Comment as needed  Activities of Daily Living Home Assistive Devices/Equipment: Eyeglasses, Cane (specify quad or straight), Built-in shower seat, Grab bars in shower, Grab bars around toilet, Walker (specify type) ADL Screening (condition at time of admission) Patient's cognitive ability adequate to safely complete daily activities?: Yes Is the patient deaf or have difficulty hearing?: No Does the patient have difficulty seeing, even when wearing glasses/contacts?: Yes Does the patient have difficulty concentrating, remembering, or making decisions?: No Patient able to express need for assistance with ADLs?: Yes Does the patient have difficulty dressing or bathing?: Yes Independently performs ADLs?: No Communication: Independent Dressing (OT): Needs assistance Is this a change from baseline?: Pre-admission baseline Grooming: Needs assistance Is this a change from baseline?: Pre-admission baseline Feeding: Needs assistance Is this a change from baseline?: Pre-admission baseline Bathing: Needs assistance Is this a change from baseline?: Pre-admission baseline Toileting: Needs assistance Is this a change from baseline?: Pre-admission baseline In/Out Bed: Needs assistance Is this a change from baseline?: Pre-admission baseline Walks in Home: Dependent Is this a change from baseline?: Pre-admission baseline Does the patient have difficulty walking or climbing stairs?: Yes Weakness of Legs: Both Weakness of Arms/Hands: Both  Permission Sought/Granted  Emotional Assessment       Orientation: : Oriented to Self, Oriented to Situation, Oriented to  Time, Oriented to Place Alcohol / Substance Use: Not Applicable Psych Involvement: No (comment)  Admission diagnosis:  Weakness  [R53.1] Lower extremity weakness [R29.898] History of progressive weakness [Z87.898] Spinal stenosis of lumbar region, unspecified whether neurogenic claudication present [M48.061] Patient Active Problem List   Diagnosis Date Noted  . Lower extremity weakness 01/14/2020   PCP:  Rigoberto Noel, MD Pharmacy[facility]   [facility] [address] [place] - [address] [place] [address] Phone: [phone] Fax: [phone]     Social Determinants of Health (SDOH) Interventions    Readmission Risk Interventions No flowsheet data found.

## 2020-01-15 NOTE — H&P (View-Only) (Signed)
Neurosurgery-New Consultation Evaluation 01/15/2020 Roy [name] [id number]  Identifying Statement: Roy [name]. is a 74 y.o. male from [facility] [place] [address] with leg weakness  Physician Requesting Consultation: Dr. Darnelle Catalan, ED  History of Present Illness: Roy [name] is a 74 year old gentleman that presented to the emergency department with concern for worsening weakness in his lower extremities.  He has been getting home physical therapy for this but does feel that he has not been improving.  Yesterday, he was noted to have worsening weakness to the point where he could not stand from the toilet.  He does note that he has some difficulty with his left knee.  He was evaluated for possible surgery there but they felt he was not a great candidate at this time.  He has had a previous right total knee replacement.  He does use a walker at home.  He does not endorse any worsening numbness but he does have some numbness at baseline in his legs.  He does not endorse any pain going down his legs.  He did get an odd electrical-like sensation yesterday.  As part of the work-up, a MRI of the lumbar spine was ordered which did reveal some stenosis.  He additionally had some concern for increased cardiac enzymes and was continued on aspirin.  Given the lumbar stenosis, neurosurgery is consulted.  Past Medical History:  Past Medical History:  Diagnosis Date   Diabetes mellitus without complication (HCC)    Hypertension     Social History: Social History   Socioeconomic History   Marital status: Unknown    Spouse name: Not on file   Number of children: Not on file   Years of education: Not on file   Highest education level: Not on file  Occupational History   Not on file  Tobacco Use   Smoking status: Never Smoker  Substance and Sexual Activity   Alcohol use: No   Drug use: Not on file   Sexual activity: Not on file  Other Topics Concern   Not on file  Social History Narrative    Not on file   Social Determinants of Health   Financial Resource Strain:    Difficulty of Paying Living Expenses: Not on file  Food Insecurity:    Worried About Running Out of Food in the Last Year: Not on file   Ran Out of Food in the Last Year: Not on file  Transportation Needs:    Lack of Transportation (Medical): Not on file   Lack of Transportation (Non-Medical): Not on file  Physical Activity:    Days of Exercise per Week: Not on file   Minutes of Exercise per Session: Not on file  Stress:    Feeling of Stress : Not on file  Social Connections:    Frequency of Communication with Friends and Family: Not on file   Frequency of Social Gatherings with Friends and Family: Not on file   Attends Religious Services: Not on file   Active Member of Clubs or Organizations: Not on file   Attends Banker Meetings: Not on file   Marital Status: Not on file  Intimate Partner Violence:    Fear of Current or Ex-Partner: Not on file   Emotionally Abused: Not on file   Physically Abused: Not on file   Sexually Abused: Not on file     Family History: History reviewed. No pertinent family history.  Review of Systems:  Review of Systems - General ROS: Negative  Psychological ROS: Negative Ophthalmic ROS: Negative ENT ROS: Negative Hematological and Lymphatic ROS: Negative  Endocrine ROS: Negative Respiratory ROS: Negative Cardiovascular ROS: Negative Gastrointestinal ROS: Negative Genito-Urinary ROS: Negative Musculoskeletal ROS: Positive for back pain Neurological ROS: Positive for weakness Dermatological ROS: Negative  Physical Exam: BP (!) 160/92 (BP Location: Left Arm)   Pulse 63   Temp 97.9 F (36.6 C) (Oral)   Resp 17   Ht 6\' 3"  (1.905 m)   Wt 114.3 kg   SpO2 96%   BMI 31.50 kg/m  Body mass index is 31.5 kg/m. Body surface area is 2.46 meters squared. General appearance: Alert, cooperative, in no acute distress Head: Normocephalic, atraumatic Eyes:  Normal, EOM intact Ext: Mild edema, discoloration in lower extremities  Neurologic exam:  Mental status: alertness: alert, affect: normal Speech: fluent and clear Motor:strength 5 out of 5 in bilateral hip flexion, knee extension, plantarflexion.  He is 4+ out of 5 in left dorsiflexion and 5 out of 5 in right Sensory: Symmetric light touch sensation in bilateral lower extremities Reflexes: 1+ at bilateral patella, negative clonus at ankle Gait: Not tested  Laboratory: Results for orders placed or performed during the hospital encounter of 01/14/20  Respiratory Panel by RT PCR (Flu A&B, Covid) - Nasopharyngeal Swab   Specimen: Nasopharyngeal Swab  Result Value Ref Range   SARS Coronavirus 2 by RT PCR NEGATIVE NEGATIVE   Influenza A by PCR NEGATIVE NEGATIVE   Influenza B by PCR NEGATIVE NEGATIVE  Basic metabolic panel  Result Value Ref Range   Sodium 141 135 - 145 mmol/L   Potassium 3.9 3.5 - 5.1 mmol/L   Chloride 104 98 - 111 mmol/L   CO2 27 22 - 32 mmol/L   Glucose, Bld 133 (H) 70 - 99 mg/dL   BUN 20 8 - 23 mg/dL   Creatinine, Ser 03/15/20 0.61 - 1.24 mg/dL   Calcium 9.9 8.9 - 5.57 mg/dL   GFR calc non Af Amer >60 >60 mL/min   GFR calc Af Amer >60 >60 mL/min   Anion gap 10 5 - 15  CBC  Result Value Ref Range   WBC 9.9 4.0 - 10.5 K/uL   RBC 4.95 4.22 - 5.81 MIL/uL   Hemoglobin 15.6 13.0 - 17.0 g/dL   HCT 32.2 39 - 52 %   MCV 95.2 80.0 - 100.0 fL   MCH 31.5 26.0 - 34.0 pg   MCHC 33.1 30.0 - 36.0 g/dL   RDW 02.5 42.7 - 06.2 %   Platelets 197 150 - 400 K/uL   nRBC 0.0 0.0 - 0.2 %  Urinalysis, Complete w Microscopic  Result Value Ref Range   Color, Urine YELLOW (A) YELLOW   APPearance HAZY (A) CLEAR   Specific Gravity, Urine 1.017 1.005 - 1.030   pH 5.0 5.0 - 8.0   Glucose, UA NEGATIVE NEGATIVE mg/dL   Hgb urine dipstick NEGATIVE NEGATIVE   Bilirubin Urine NEGATIVE NEGATIVE   Ketones, ur 20 (A) NEGATIVE mg/dL   Protein, ur 37.6 (A) NEGATIVE mg/dL   Nitrite NEGATIVE  NEGATIVE   Leukocytes,Ua SMALL (A) NEGATIVE   RBC / HPF 0-5 0 - 5 RBC/hpf   WBC, UA 0-5 0 - 5 WBC/hpf   Bacteria, UA NONE SEEN NONE SEEN   Squamous Epithelial / LPF 0-5 0 - 5   Mucus PRESENT    Hyaline Casts, UA PRESENT   Magnesium  Result Value Ref Range   Magnesium 1.7 1.7 - 2.4 mg/dL  Phosphorus  Result Value  Ref Range   Phosphorus 2.7 2.5 - 4.6 mg/dL  Basic metabolic panel  Result Value Ref Range   Sodium 142 135 - 145 mmol/L   Potassium 3.6 3.5 - 5.1 mmol/L   Chloride 104 98 - 111 mmol/L   CO2 27 22 - 32 mmol/L   Glucose, Bld 118 (H) 70 - 99 mg/dL   BUN 19 8 - 23 mg/dL   Creatinine, Ser 6.72 0.61 - 1.24 mg/dL   Calcium 9.7 8.9 - 09.4 mg/dL   GFR calc non Af Amer >60 >60 mL/min   GFR calc Af Amer >60 >60 mL/min   Anion gap 11 5 - 15  CBC  Result Value Ref Range   WBC 8.9 4.0 - 10.5 K/uL   RBC 4.84 4.22 - 5.81 MIL/uL   Hemoglobin 15.5 13.0 - 17.0 g/dL   HCT 70.9 39 - 52 %   MCV 95.0 80.0 - 100.0 fL   MCH 32.0 26.0 - 34.0 pg   MCHC 33.7 30.0 - 36.0 g/dL   RDW 62.8 36.6 - 29.4 %   Platelets 181 150 - 400 K/uL   nRBC 0.0 0.0 - 0.2 %  TSH  Result Value Ref Range   TSH 1.101 0.350 - 4.500 uIU/mL  Lipid panel  Result Value Ref Range   Cholesterol 177 0 - 200 mg/dL   Triglycerides 765 <465 mg/dL   HDL 59 >03 mg/dL   Total CHOL/HDL Ratio 3.0 RATIO   VLDL 23 0 - 40 mg/dL   LDL Cholesterol 95 0 - 99 mg/dL  Glucose, capillary  Result Value Ref Range   Glucose-Capillary 126 (H) 70 - 99 mg/dL  Glucose, capillary  Result Value Ref Range   Glucose-Capillary 127 (H) 70 - 99 mg/dL   Comment 1 Notify RN   Troponin I (High Sensitivity)  Result Value Ref Range   Troponin I (High Sensitivity) 22 (H) <18 ng/L  Troponin I (High Sensitivity)  Result Value Ref Range   Troponin I (High Sensitivity) 31 (H) <18 ng/L  Troponin I (High Sensitivity)  Result Value Ref Range   Troponin I (High Sensitivity) 30 (H) <18 ng/L  Troponin I (High Sensitivity)  Result Value Ref Range    Troponin I (High Sensitivity) 34 (H) <18 ng/L   I personally reviewed labs  Imaging: MRI lumbar spine:1. Multifactorial degenerative changes at L2-3 through L4-5 with resultant severe canal with bilateral subarticular stenosis as above, most pronounced at L3-4. 2. 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 3. Moderate to severe bilateral L2 through L5 foraminal stenosis as detailed above.   Impression/Plan:  Mr. Howatt is here for concerns for lower extremity weakness.  On my exam, he is full strength and I do think this is functional weakness which could be related to the lumbar stenosis.  We did discuss the symptoms of neurogenic claudication but given his concern for blood flow in his lower extremities, there is a possibility of vascular component.  We would recommend an ABI for this.  Given that both legs are affected, I would like to get an MRI of the thoracic spine to rule out any etiology of his symptoms there.  Lastly, we would need him clear for any possible surgery from medical standpoint given the cardiac enzyme abnormality as well as underlying comorbidities including diabetes.  I did discuss with the patient and his wife that given the  longstanding diabetes and peripheral neuropathy, I cannot say definitively that the lumbar stenosis is causing the symptoms but we could discuss intervention if medically cleared and we rule out other possible etiologies.  The patient did express also history of carpal tunnel syndrome on the left which appears to be worsening.  He is wearing a wrist sling at night which appears to help.  He did have an injection which was not very helpful.  He is functionally left-handed and this is affecting him.   1.  Diagnosis  Lumbar stenosis Left carpal tunnel syndrome  2.  Plan MRI thoracic spine ordered Would recommend ABI to rule out  lower extremity vascular claudication We will need medical clearance for any possible intervention to include cardiac evaluation

## 2020-01-15 NOTE — Evaluation (Signed)
Physical Therapy Evaluation Patient Details Name: Roy [name]. MRN: [id number] DOB: [birth year] Today's Date: 01/15/2020   History of Present Illness  Roy [name] is a 74 y.o. male with a known history of diabetes, hypertension presents to the emergency department for evaluation of bilateral lower extremity weakness.  Patient attempted to use the bathroom this morning but was so weak in his lower extremities that he could not stand from the toilet.  He reports severe progressively worsening bilateral lower extremity weakness despite getting home physical therapy.  Patient reports associated low back pain which radiates down the backs of both legs.  Clinical Impression  Patient received in bed, agrees to PT session. Wife present in room. He is very pleasant. Reports he is so weak he is unable to stand or walk. Requires mod +2 assist for bed mobility due to weakness. He requires assistance with initial sitting balance and required multiple attempts to try to stand from edge of stretcher. Patient was able to stand with mod +2 assist and RW. Unable to march in place or take any steps at this time. He will benefit from continued skilled PT while here to improve functional independence with mobility for return to prior level.     Follow Up Recommendations SNF;Supervision/Assistance - 24 hour    Equipment Recommendations  Other (comment) (TBD)    Recommendations for Other Services       Precautions / Restrictions Precautions Precautions: Fall Required Braces or Orthoses: Other Brace Other Brace: has B knee braces for support with standing/walking Restrictions Weight Bearing Restrictions: No      Mobility  Bed Mobility Overal bed mobility: Needs Assistance Bed Mobility: Supine to Sit;Sit to Supine     Supine to sit: Mod assist;+2 for physical assistance Sit to supine: Mod assist;+2 for physical assistance   General bed mobility comments: requires mod assist for raising trunk  and scooting legs off bed to sit up. Requires assist to bring LEs back onto bed and for positioning  Transfers Overall transfer level: Needs assistance Equipment used: Rolling walker (2 wheeled) Transfers: Sit to/from Stand Sit to Stand: Mod assist;+2 physical assistance         General transfer comment: required multiple attempts to get standing from edge of stretcher.  Ambulation/Gait             General Gait Details: unable  Stairs            Wheelchair Mobility    Modified Rankin (Stroke Patients Only)       Balance Overall balance assessment: Needs assistance Sitting-balance support: Feet supported Sitting balance-Leahy Scale: Fair Sitting balance - Comments: initially poor sitting balance requiring min assist.   Standing balance support: Bilateral upper extremity supported;During functional activity Standing balance-Leahy Scale: Fair Standing balance comment: Heavy reliance on RW with standing as well as needing external assist to maintain balance                             Pertinent Vitals/Pain Pain Assessment: No/denies pain    Home Living Family/patient expects to be discharged to:: Skilled nursing facility                      Prior Function Level of Independence: Needs assistance   Gait / Transfers Assistance Needed: able to ambulate short distances with RW at home at baseline.  ADL's / Homemaking Assistance Needed: requires assist  Hand Dominance   Dominant Hand: Left    Extremity/Trunk Assessment   Upper Extremity Assessment Upper Extremity Assessment: Generalized weakness    Lower Extremity Assessment Lower Extremity Assessment: Generalized weakness    Cervical / Trunk Assessment Cervical / Trunk Assessment: Normal  Communication   Communication: No difficulties  Cognition Arousal/Alertness: Awake/alert Behavior During Therapy: WFL for tasks assessed/performed Overall Cognitive Status: Within  Functional Limits for tasks assessed                                        General Comments      Exercises     Assessment/Plan    PT Assessment Patient needs continued PT services  PT Problem List Decreased strength;Decreased mobility;Decreased activity tolerance;Decreased balance       PT Treatment Interventions DME instruction;Therapeutic activities;Gait training;Therapeutic exercise;Patient/family education;Functional mobility training;Balance training    PT Goals (Current goals can be found in the Care Plan section)  Acute Rehab PT Goals Patient Stated Goal: to improve, walk again PT Goal Formulation: With patient/family Time For Goal Achievement: 01/29/20 Potential to Achieve Goals: Fair    Frequency Min 3X/week   Barriers to discharge Decreased caregiver support;Inaccessible home environment 4 steps to enter home, wife unable to manage him currently    Co-evaluation               AM-PAC PT "6 Clicks" Mobility  Outcome Measure Help needed turning from your back to your side while in a flat bed without using bedrails?: A Lot Help needed moving from lying on your back to sitting on the side of a flat bed without using bedrails?: A Lot Help needed moving to and from a bed to a chair (including a wheelchair)?: A Lot Help needed standing up from a chair using your arms (e.g., wheelchair or bedside chair)?: A Lot Help needed to walk in hospital room?: Total Help needed climbing 3-5 steps with a railing? : Total 6 Click Score: 10    End of Session Equipment Utilized During Treatment: Gait belt Activity Tolerance: Patient tolerated treatment well;Patient limited by fatigue Patient left: in bed;with call bell/phone within reach;with family/visitor present Nurse Communication: Mobility status PT Visit Diagnosis: Unsteadiness on feet (R26.81);Other abnormalities of gait and mobility (R26.89);Muscle weakness (generalized) (M62.81);Difficulty in  walking, not elsewhere classified (R26.2);Repeated falls (R29.6)    Time: 2263-3354 PT Time Calculation (min) (ACUTE ONLY): 38 min   Charges:   PT Evaluation $PT Eval Moderate Complexity: 1 Mod PT Treatments $Therapeutic Activity: 8-22 mins        Jenessa Gillingham, PT, GCS 01/15/20,12:31 PM

## 2020-01-15 NOTE — Progress Notes (Signed)
Director of floor [employer] gave permission for wife to stay during patients hospital stay. Charge nurse aware as well.

## 2020-01-15 NOTE — Progress Notes (Signed)
*  PRELIMINARY RESULTS* Echocardiogram 2D Echocardiogram has been performed.  Joanette Gula Orla Jolliff 01/15/2020, 11:39 AM

## 2020-01-15 NOTE — ED Notes (Signed)
Pt taken to MRI  

## 2020-01-16 ENCOUNTER — Inpatient Hospital Stay: Payer: Medicare HMO

## 2020-01-16 DIAGNOSIS — M48061 Spinal stenosis, lumbar region without neurogenic claudication: Secondary | ICD-10-CM | POA: Diagnosis not present

## 2020-01-16 DIAGNOSIS — I739 Peripheral vascular disease, unspecified: Secondary | ICD-10-CM | POA: Diagnosis not present

## 2020-01-16 DIAGNOSIS — Z87898 Personal history of other specified conditions: Secondary | ICD-10-CM | POA: Diagnosis not present

## 2020-01-16 DIAGNOSIS — R531 Weakness: Secondary | ICD-10-CM

## 2020-01-16 DIAGNOSIS — M4800 Spinal stenosis, site unspecified: Secondary | ICD-10-CM

## 2020-01-16 LAB — HEMOGLOBIN A1C
Hgb A1c MFr Bld: 5.8 % — ABNORMAL HIGH (ref 4.8–5.6)
Mean Plasma Glucose: 119.76 mg/dL

## 2020-01-16 LAB — GLUCOSE, CAPILLARY
Glucose-Capillary: 173 mg/dL — ABNORMAL HIGH (ref 70–99)
Glucose-Capillary: 164 mg/dL — ABNORMAL HIGH (ref 70–99)
Glucose-Capillary: 197 mg/dL — ABNORMAL HIGH (ref 70–99)
Glucose-Capillary: 240 mg/dL — ABNORMAL HIGH (ref 70–99)

## 2020-01-16 LAB — VITAMIN D 25 HYDROXY (VIT D DEFICIENCY, FRACTURES): Vit D, 25-Hydroxy: 20.21 ng/mL — ABNORMAL LOW (ref 30–100)

## 2020-01-16 LAB — TROPONIN I (HIGH SENSITIVITY): Troponin I (High Sensitivity): 36 ng/L — ABNORMAL HIGH (ref <18)

## 2020-01-16 LAB — VITAMIN B12: Vitamin B-12: 811 pg/mL (ref 180–914)

## 2020-01-16 MED ORDER — INSULIN DETEMIR 100 UNIT/ML ~~LOC~~ SOLN
10.0000 [IU] | Freq: Every day | SUBCUTANEOUS | Status: DC
  Administered 2020-01-16 – 2020-01-17 (×2): 10 [IU] via SUBCUTANEOUS
  Filled 2020-01-16 (×3): qty 0.1

## 2020-01-16 MED ORDER — AMLODIPINE BESYLATE 5 MG PO TABS
5.0000 mg | ORAL_TABLET | Freq: Every day | ORAL | Status: DC
  Administered 2020-01-16 – 2020-01-25 (×10): 5 mg via ORAL
  Filled 2020-01-16 (×10): qty 1

## 2020-01-16 MED ORDER — ADULT MULTIVITAMIN W/MINERALS CH
1.0000 | ORAL_TABLET | Freq: Every day | ORAL | Status: DC
  Administered 2020-01-16 – 2020-01-25 (×10): 1 via ORAL
  Filled 2020-01-16 (×10): qty 1

## 2020-01-16 MED ORDER — ASPIRIN EC 81 MG PO TBEC
81.0000 mg | DELAYED_RELEASE_TABLET | Freq: Every evening | ORAL | Status: DC
  Administered 2020-01-16 – 2020-01-20 (×5): 81 mg via ORAL
  Filled 2020-01-16 (×5): qty 1

## 2020-01-16 MED ORDER — MELATONIN 5 MG PO TABS
5.0000 mg | ORAL_TABLET | Freq: Every evening | ORAL | Status: DC | PRN
  Administered 2020-01-16: 5 mg via ORAL
  Administered 2020-01-17 – 2020-01-24 (×8): 10 mg via ORAL
  Filled 2020-01-16 (×2): qty 2
  Filled 2020-01-16: qty 1
  Filled 2020-01-16 (×6): qty 2

## 2020-01-16 MED ORDER — BRIMONIDINE TARTRATE 0.2 % OP SOLN
1.0000 [drp] | Freq: Two times a day (BID) | OPHTHALMIC | Status: DC
  Administered 2020-01-16 – 2020-01-25 (×19): 1 [drp] via OPHTHALMIC
  Filled 2020-01-16: qty 5

## 2020-01-16 MED ORDER — FAMOTIDINE 20 MG PO TABS
20.0000 mg | ORAL_TABLET | Freq: Every evening | ORAL | Status: DC | PRN
  Administered 2020-01-19: 20 mg via ORAL

## 2020-01-16 MED ORDER — POLYETHYLENE GLYCOL 3350 17 G PO PACK
17.0000 g | PACK | Freq: Every day | ORAL | Status: DC
  Administered 2020-01-17 – 2020-01-22 (×3): 17 g via ORAL
  Filled 2020-01-16 (×5): qty 1

## 2020-01-16 MED ORDER — LATANOPROST 0.005 % OP SOLN
1.0000 [drp] | Freq: Every day | OPHTHALMIC | Status: DC
  Administered 2020-01-16 – 2020-01-24 (×9): 1 [drp] via OPHTHALMIC
  Filled 2020-01-16: qty 2.5

## 2020-01-16 MED ORDER — ENSURE MAX PROTEIN PO LIQD
11.0000 [oz_av] | Freq: Three times a day (TID) | ORAL | Status: DC
  Administered 2020-01-16 – 2020-01-21 (×3): 11 [oz_av] via ORAL
  Filled 2020-01-16: qty 330

## 2020-01-16 MED ORDER — VITAMIN B-12 1000 MCG PO TABS
2000.0000 ug | ORAL_TABLET | Freq: Every day | ORAL | Status: DC
  Administered 2020-01-16 – 2020-01-25 (×10): 2000 ug via ORAL
  Filled 2020-01-16 (×10): qty 2

## 2020-01-16 NOTE — Plan of Care (Signed)

## 2020-01-16 NOTE — Progress Notes (Signed)
Physical Therapy Treatment Patient Details Name: Roy [name]. MRN: [id number] DOB: 01/06/1946 Today's Date: 01/16/2020    History of Present Illness Roy [name] is a 74 y.o. male with a known history of diabetes, hypertension presents to the emergency department for evaluation of bilateral lower extremity weakness.  Patient attempted to use the bathroom this morning but was so weak in his lower extremities that he could not stand from the toilet.  He reports severe progressively worsening bilateral lower extremity weakness despite getting home physical therapy.  Patient reports associated low back pain which radiates down the backs of both legs.    PT Comments    Pt was supine in bed with HOB elevated ~ 15 degrees and spouse at bedside. He agrees to PT session and reports L knee pain that he thought he hurt the previous night. Pt was able to exit L side of bed with mod-max of one and then stood EOB 1 x with +2 assistance. Very minimal standing time 2/2 to pain and weakness. He was returned to bed post session. Lengthy discussion with pt/spouse about SNFs and need to continue to perform there ex in bed to promote strengthening. He will need extensive PT going forward and will benefit from SNF to address deficits and improve independence.    Follow Up Recommendations  SNF;Supervision/Assistance - 24 hour     Equipment Recommendations  Other (comment) (defer to next level of care)    Recommendations for Other Services       Precautions / Restrictions Precautions Precautions: Fall Required Braces or Orthoses: Other Brace (BLES knee braces) Other Brace: has B knee braces for support with standing/walking Restrictions Weight Bearing Restrictions: No    Mobility  Bed Mobility Overal bed mobility: Needs Assistance Bed Mobility: Supine to Sit;Sit to Supine     Supine to sit: Mod assist;Max assist;HOB elevated Sit to supine: Max assist;Mod assist;HOB elevated   General bed  mobility comments: pt required increased time + mod-max assist of one to exit bed and return to supine. Vcs throughout for improved technique and sequencing  Transfers Overall transfer level: Needs assistance Equipment used: Rolling walker (2 wheeled) Transfers: Sit to/from Stand Sit to Stand: Min assist;Mod assist;+2 physical assistance;+2 safety/equipment;From elevated surface         General transfer comment: Pt stood 1 x EOB for very minimal time. c/o L knee pain requesting to sit. unable to trial 2/2 attempt due to pain and fatigue  Ambulation/Gait    General Gait Details: unable/unsafe       Balance Overall balance assessment: Needs assistance Sitting-balance support: Feet supported Sitting balance-Leahy Scale: Good Sitting balance - Comments: no LOB in sitting with BLEs supported on floor   Standing balance support: Bilateral upper extremity supported;During functional activity   Standing balance comment: difficulty to assess 2/2 to very limited standing time       Cognition Arousal/Alertness: Awake/alert Behavior During Therapy: WFL for tasks assessed/performed Overall Cognitive Status: Within Functional Limits for tasks assessed      General Comments: Pt is A and O x 4 and agreeable and cooperative throughout             Pertinent Vitals/Pain Pain Assessment: No/denies pain           PT Goals (current goals can now be found in the care plan section) Acute Rehab PT Goals Patient Stated Goal: to return to PLOF before I started getting weakness in my legs Progress towards PT goals: Not progressing toward  goals - comment (limited by pain and fatigue)    Frequency    Min 3X/week      PT Plan Current plan remains appropriate    Co-evaluation              AM-PAC PT "6 Clicks" Mobility   Outcome Measure  Help needed turning from your back to your side while in a flat bed without using bedrails?: A Lot Help needed moving from lying on your  back to sitting on the side of a flat bed without using bedrails?: A Lot Help needed moving to and from a bed to a chair (including a wheelchair)?: A Lot Help needed standing up from a chair using your arms (e.g., wheelchair or bedside chair)?: A Lot Help needed to walk in hospital room?: Total Help needed climbing 3-5 steps with a railing? : Total 6 Click Score: 10    End of Session Equipment Utilized During Treatment: Gait belt Activity Tolerance: Patient tolerated treatment well;Patient limited by fatigue;Patient limited by pain Patient left: in bed;with call bell/phone within reach;with family/visitor present Nurse Communication: Mobility status PT Visit Diagnosis: Unsteadiness on feet (R26.81);Other abnormalities of gait and mobility (R26.89);Muscle weakness (generalized) (M62.81);Difficulty in walking, not elsewhere classified (R26.2);Repeated falls (R29.6)     Time: 3762-8315 PT Time Calculation (min) (ACUTE ONLY): 27 min  Charges:  $Therapeutic Exercise: 8-22 mins $Therapeutic Activity: 8-22 mins                     Jetta Lout PTA 01/16/20, 3:05 PM

## 2020-01-16 NOTE — Progress Notes (Signed)
Initial Nutrition Assessment  DOCUMENTATION CODES:   Obesity unspecified  INTERVENTION:   Ensure Max protein supplement TID, each supplement provides 150kcal and 30g of protein.  MVI daily   Liberalize diet   Recommend check B12 and vitamin D labs  NUTRITION DIAGNOSIS:   Inadequate oral intake related to acute illness as evidenced by per patient/family report.  GOAL:   Patient will meet greater than or equal to 90% of their needs  MONITOR:   PO intake, Supplement acceptance, Labs, Weight trends, Skin, I & O's  REASON FOR ASSESSMENT:   Malnutrition Screening Tool    ASSESSMENT:   74 y.o. male with a known history of diabetes, hypertension presents to the emergency department for evaluation of bilateral lower extremity weakness.  RD working remotely.  Spoke with pt's wife via phone. Wife reports pt with poor appetite and oral intake for several months pta. Wife reports that pt has been eating more in hospital than he usually eats at home. A typical day for pt would include a protein bar and a banana for breakfast, a salad for lunch and then whatever is prepared by his wife for dinner. Per chart, pt has lost 65lbs(21%) over the past year; this is significant. Wife is aware of weight loss and reports that this was r/t a combination of several new diabetes medications, reducing carbs from his diet and his decreased appetite and oral intake.  Wife bought protein drinks for pt to drink at home but he has only drank 2 from the case so far. Pt does take B12 and MVI daily at home. Wife reports pt with a h/o B12 deficiency and that that he takes per day per his primary care MD recommendations. Would recommend checking B12 and vitamin D labs as deficiencies in both of these can cause weakness. Pt ate 95% of his breakfast today and reports that he is willing to drink protein shakes in hospital; RD will order.   Medications reviewed and include: aspirin, lovenox, insulin, MVI,  miralax, B12  Labs reviewed: cbgs- 187, 138, 154, 215, 173 x 48hrs  NUTRITION - FOCUSED PHYSICAL EXAM: Unable to perform at this time   Diet Order:   Diet Order            Diet Carb Modified Fluid consistency: Thin; Room service appropriate? Yes  Diet effective now                EDUCATION NEEDS:   Education needs have been addressed  Skin:  Skin Assessment: Reviewed RN Assessment (ecchymosis, skin tear arm and finger)  Last BM:  10/3  Height:   Ht Readings from Last 1 Encounters:  01/15/20 6\' 4"  (1.93 m)    Weight:   Wt Readings from Last 1 Encounters:  01/16/20 114.6 kg    Ideal Body Weight:  91.8 kg  BMI:  Body mass index is 30.75 kg/m.  Estimated Nutritional Needs:   Kcal:  2500-2800kcal/day  Protein:  125-140g/day  Fluid:  >2.3L/day  03/17/20 MS, RD, LDN Please refer to [facility] for RD and/or RD on-call/weekend/after hours pager

## 2020-01-16 NOTE — Progress Notes (Signed)
PROGRESS NOTE    Roy [name].  ZOX:[id number] DOB: 1945-09-06 DOA: 01/14/2020 PCP: Roy Noel, MD   Brief Narrative: Taken from H&P Roy [name] a 74 y.o.mw h/o DM, HTN,  presents to ED forevaluation of bilateral lower extremity weakness. Patient attempted to use the bathroom this morning but was so weak in his lower extremities that he could not stand from the toilet. He reports severe progressively worsening bilateral lower extremity weakness despite getting home physical therapy. Patient reports associated low back pain which radiates down the backs of both legs.  Per wife patient has progressively worsening lower extremity weakness and overall declined in his have for the past 1 year.  Up till few weeks ago he was able to walk around the house with walker which is becoming difficult now.  For the past couple of weeks he is spending most of the time in chair and having frequent falls. Patient has history of spinal stenosis and diabetic neuropathy involving both lower extremities. Neurosurgery was also consulted.  Subjective: Patient continued to feel bilateral lower extremity weakness.  Wife was at bedside.  According to her he is having progressively worsening weakness for over a year now, physical therapy at home was not helping.  It is becoming difficult for his wife to take care of him as he requires two-person to help him walk couple of feet at home.  Having frequent falls.  Assessment & Plan:   Active Problems:   Lower extremity weakness  Bilateral lower extremity weakness with gait instability.  Patient is unable to walk due to worsening lower extremity weakness.  Multiple etiologies can be contributory which include severe spinal stenosis, diabetic neuropathy and a possible vasculopathy.  Neurosurgery was consulted and they recommend doing a thoracic MRI and ABI-waiting their comment on thoracic MRI which also shows stenosis and some disc bulging. Good  strength on dorsiflexion and plantarflexion on bilateral feet, poor effort to lift the leg up stating that he cannot do it. -PT is recommending SNF placement,-TOC to work on it. -Continue with pain management. -Check vitamin B12 and D levels.  Elevated troponin.  Mildly elevated troponin with a flat curve.  No chest pain. Most likely secondary to demand.  Echocardiogram obtained yesterday with normal EF, grade 1 diastolic dysfunction and no regional wall motion abnormalities.  Type 2 diabetes mellitus.  CBG mildly elevated. Patient was on Metformin and Levemir at home.  A1c in June 2021 was 6.5. -Add Levemir 10 units at bedtime. -Continue with SSI. -Check A1c.  Hypertension.  Blood pressure within goal. -Continue home dose of Coreg and amlodipine.  Hyperlipidemia. -Continue home dose of Zocor  History of glaucoma. -Continue home eye drops  Objective: Vitals:   01/15/20 1939 01/16/20 0353 01/16/20 0716 01/16/20 1258  BP: (!) 145/86 (!) 168/89 (!) 146/87 130/78  Pulse: 67 90 84 75  Resp: 20 20 18 20   Temp: 98.4 F (36.9 C) 98.9 F (37.2 C) 98.1 F (36.7 C) 98.6 F (37 C)  TempSrc: Oral Oral Oral Oral  SpO2: 96% 97% 96% 98%  Weight:  114.6 kg    Height:        Intake/Output Summary (Last 24 hours) at 01/16/2020 1318 Last data filed at 01/16/2020 1013 Gross per 24 hour  Intake 120 ml  Output --  Net 120 ml   Filed Weights   01/14/20 1137 01/15/20 1341 01/16/20 0353  Weight: 114.3 kg 114.4 kg 114.6 kg    Examination:  General exam: Well-developed, obese elderly  man, appears calm and comfortable  Respiratory system: Clear to auscultation. Respiratory effort normal. Cardiovascular system: S1 & S2 heard, RRR.  Gastrointestinal system: Soft, nontender, nondistended, bowel sounds positive. Central nervous system: Alert and oriented. No focal neurological deficits. Good strength on both feet, refusing to left leg stating that he cannot do it. Extremities: No edema, no  cyanosis, pulses intact and symmetrical. Psychiatry: Judgement and insight appear normal.    DVT prophylaxis: Lovenox Code Status: Full Family Communication: Wife was updated at bedside Disposition Plan:  Status is: Inpatient  Remains inpatient appropriate because:Inpatient level of care appropriate due to severity of illness   Dispo: The patient is from: Home              Anticipated d/c is to: SNF              Anticipated d/c date is: 2 days              Patient currently is not medically stable to d/c.   Consultants:   Neurosurgery  Procedures:  Antimicrobials:   Data Reviewed: I have personally reviewed following labs and imaging studies  CBC: Recent Labs  Lab 01/14/20 1143 01/15/20 0348  WBC 9.9 8.9  HGB 15.6 15.5  HCT 47.1 46.0  MCV 95.2 95.0  PLT 197 181   Basic Metabolic Panel: Recent Labs  Lab 01/14/20 1143 01/14/20 2154 01/15/20 0348  NA 141  --  142  K 3.9  --  3.6  CL 104  --  104  CO2 27  --  27  GLUCOSE 133*  --  118*  BUN 20  --  19  CREATININE 1.07  --  0.93  CALCIUM 9.9  --  9.7  MG  --  1.7  --   PHOS  --  2.7  --    GFR: Estimated Creatinine Clearance: 96.5 mL/min (by C-G formula based on SCr of 0.93 mg/dL). Liver Function Tests: No results for input(s): AST, ALT, ALKPHOS, BILITOT, PROT, ALBUMIN in the last 168 hours. No results for input(s): LIPASE, AMYLASE in the last 168 hours. No results for input(s): AMMONIA in the last 168 hours. Coagulation Profile: No results for input(s): INR, PROTIME in the last 168 hours. Cardiac Enzymes: No results for input(s): CKTOTAL, CKMB, CKMBINDEX, TROPONINI in the last 168 hours. BNP (last 3 results) No results for input(s): PROBNP in the last 8760 hours. HbA1C: No results for input(s): HGBA1C in the last 72 hours. CBG: Recent Labs  Lab 01/15/20 1409 01/15/20 1629 01/15/20 2136 01/16/20 0718 01/16/20 1300  GLUCAP 138* 154* 215* 173* 164*   Lipid Profile: Recent Labs     01/14/20 2154  CHOL 177  HDL 59  LDLCALC 95  TRIG 116  CHOLHDL 3.0   Thyroid Function Tests: Recent Labs    01/14/20 2154  TSH 1.101   Anemia Panel: No results for input(s): VITAMINB12, FOLATE, FERRITIN, TIBC, IRON, RETICCTPCT in the last 72 hours. Sepsis Labs: No results for input(s): PROCALCITON, LATICACIDVEN in the last 168 hours.  Recent Results (from the past 240 hour(s))  Respiratory Panel by RT PCR (Flu A&B, Covid) - Nasopharyngeal Swab     Status: None   Collection Time: 01/14/20  8:52 PM   Specimen: Nasopharyngeal Swab  Result Value Ref Range Status   SARS Coronavirus 2 by RT PCR NEGATIVE NEGATIVE Final    Comment: (NOTE) SARS-CoV-2 target nucleic acids are NOT DETECTED.  The SARS-CoV-2 RNA is generally detectable in upper respiratoy specimens during  the acute phase of infection. The lowest concentration of SARS-CoV-2 viral copies this assay can detect is 131 copies/mL. A negative result does not preclude SARS-Cov-2 infection and should not be used as the sole basis for treatment or other patient management decisions. A negative result may occur with  improper specimen collection/handling, submission of specimen other than nasopharyngeal swab, presence of viral mutation(s) within the areas targeted by this assay, and inadequate number of viral copies (<131 copies/mL). A negative result must be combined with clinical observations, patient history, and epidemiological information. The expected result is Negative.  Fact Sheet for Patients:  https://www.moore.com/  Fact Sheet for Healthcare Providers:  https://www.young.biz/  This test is no t yet approved or cleared by the [place] FDA and  has been authorized for detection and/or diagnosis of SARS-CoV-2 by FDA under an Emergency Use Authorization (EUA). This EUA will remain  in effect (meaning this test can be used) for the duration of the COVID-19 declaration under  Section 564(b)(1) of the Act, 21 U.S.C. section 360bbb-3(b)(1), unless the authorization is terminated or revoked sooner.     Influenza A by PCR NEGATIVE NEGATIVE Final   Influenza B by PCR NEGATIVE NEGATIVE Final    Comment: (NOTE) The Xpert Xpress SARS-CoV-2/FLU/RSV assay is intended as an aid in  the diagnosis of influenza from Nasopharyngeal swab specimens and  should not be used as a sole basis for treatment. Nasal washings and  aspirates are unacceptable for Xpert Xpress SARS-CoV-2/FLU/RSV  testing.  Fact Sheet for Patients: https://www.moore.com/  Fact Sheet for Healthcare Providers: https://www.young.biz/  This test is not yet approved or cleared by the [place] FDA and  has been authorized for detection and/or diagnosis of SARS-CoV-2 by  FDA under an Emergency Use Authorization (EUA). This EUA will remain  in effect (meaning this test can be used) for the duration of the  Covid-19 declaration under Section 564(b)(1) of the Act, 21  U.S.C. section 360bbb-3(b)(1), unless the authorization is  terminated or revoked. Performed at [facility], [address], [place], [place] [address]      Radiology Studies: MR THORACIC SPINE WO CONTRAST  Result Date: 01/15/2020 CLINICAL DATA:  Acute presentation with bilateral lower extremity weakness. Myelopathy. EXAM: MRI THORACIC SPINE WITHOUT CONTRAST TECHNIQUE: Multiplanar, multisequence MR imaging of the thoracic spine was performed. No intravenous contrast was administered. COMPARISON:  Lumbar study done yesterday. FINDINGS: Alignment: No significant thoracic malalignment. Mild scoliotic curvature. Vertebrae: Chronic fusion at T8 and T9. No evidence of regional fracture or acute bone pathology. Cord: No primary cord lesion. See below regarding degenerative findings. Paraspinal and other soft tissues: Negative Disc levels: There is cervical spondylosis, not studied in any complete  fashion. C7-T1: Endplate osteophytes and bulging of the disc. Narrowing of the ventral subarachnoid space but no compressive canal stenosis. Bilateral bony foraminal stenosis. T1-2: Mild disc bulge and facet hypertrophy. No compressive stenosis. T2-3: Mild disc bulge and facet hypertrophy. No compressive stenosis. T3-4: Disc bulge and endplate osteophytes more prominent towards the right. Narrowing of the ventral subarachnoid space on the right but no cord compression. Bilateral facet degeneration. Mild right foraminal stenosis. T4-5: Endplate osteophytes and bulging of the disc. No compressive canal stenosis. Foramina sufficiently patent. T5-6: Disc herniation in both the right posterolateral and left posterolateral direction, more prominent towards the left. Effacement of the ventral subarachnoid space and some deformity/flattening of the ventral cord. Subarachnoid space remains present dorsal to the cord however. Bilateral foraminal stenosis. Findings at this level could  be symptomatic. T6-7: Disc bulge.  No compressive canal or foraminal stenosis. T7-8: Disc bulge.  No compressive canal or foraminal stenosis per T8-9: Chronic fusion.  Sufficient patency of the canal and foramina. T9-10: Mild bulging of the disc. Bilateral facet degeneration and hypertrophy. Mild canal stenosis without cord compression or deformity. Bilateral foraminal narrowing. T10-11: Bulging of the disc. Bilateral facet degeneration and hypertrophy. Mild canal narrowing but no compression of the cord. Bilateral foraminal stenosis left worse than right. Either T10 nerve could be affected, particularly the left. T11-12: Bulging of the disc towards the left. Pronounced facet arthropathy. Canal narrowing without cord compression. Bilateral foraminal stenosis left more than right. Either T11 nerve could be affected, particularly the left. T12-L1: Endplate osteophytes and bulging of the disc. No compressive canal or foraminal stenosis. IMPRESSION:  1. No acute fracture in the thoracic region. Chronic fusion at T8 and T9. 2. Degenerative changes throughout the thoracic region as outlined above. At T5-6, there are bilateral posterolateral disc herniations more prominent towards the left. Effacement of the ventral subarachnoid space with mild cord deformity. Subarachnoid space remains present dorsal to the cord however. Bilateral foraminal stenosis. No other level shows any deformation of the cord. 3. At T9-10, there is facet arthropathy with bilateral foraminal stenosis. 4. At T10-11, there is bulging of the disc and facet arthropathy with foraminal stenosis left worse than right. 5. At T11-12, there is bulging of the disc and facet arthropathy with foraminal stenosis left worse than right. Electronically Signed   By: Paulina Fusi M.D.   On: 01/15/2020 10:04   MR LUMBAR SPINE WO CONTRAST  Result Date: 01/14/2020 CLINICAL DATA:  Initial evaluation for low back pain with acute tingling in lower spine, weakness. EXAM: MRI LUMBAR SPINE WITHOUT CONTRAST TECHNIQUE: Multiplanar, multisequence MR imaging of the lumbar spine was performed. No intravenous contrast was administered. COMPARISON:  None available. FINDINGS: Segmentation: Standard. Lowest well-formed disc space labeled the L5-S1 level. Alignment: Dextroscoliosis with apex at L2-3. Trace 2 mm anterolisthesis of L5 on S1, chronic and facet mediated. Vertebrae: Vertebral body height maintained without acute or chronic fracture. L2 and L3 vertebral bodies are partially ankylosed anteriorly, degenerative in nature. Bone marrow signal intensity diffusely heterogeneous without discrete or worrisome osseous lesions. No abnormal marrow edema. Conus medullaris and cauda equina: Conus extends to the L1 level. Conus and cauda equina appear normal. Paraspinal and other soft tissues: Paraspinous soft tissues demonstrate no acute finding. Disc levels: T12-L1: Disc desiccation with mild disc bulge. Prominent anterior  endplate osteophytic spurring. Superimposed tiny right subarticular disc protrusion mildly indents the right ventral thecal sac. Mild facet hypertrophy. No significant canal or foraminal stenosis. L1-2: Diffuse disc bulge with disc desiccation and intervertebral disc space narrowing. Prominent reactive endplate osteophytic spurring, most pronounced anteriorly and to the right. Disc bulge slightly asymmetric to the right. Mild facet hypertrophy. Mild narrowing of the right lateral recess without significant spinal stenosis. Foramina remain patent. L2-3: Severe degenerative intervertebral disc space narrowing with partial ankylosis of the L2 and L3 vertebral bodies. Associated pronounced circumferential reactive endplate osteophytic spurring and diffuse disc bulge. Moderate bilateral facet hypertrophy. Resultant severe spinal stenosis. Thecal sac measures 7-8 mm in AP diameter at its most narrow point. Moderate left L2 foraminal narrowing. Right neural foramen remains patent. L3-4: Degenerative intervertebral disc space narrowing with diffuse disc bulge and disc desiccation. Prominent reactive endplate osteophytic spurring. Moderate to severe bilateral facet hypertrophy, left worse than right. Cystic degeneration at the left ligamentum flavum noted. Resultant  severe spinal stenosis with near effacement of the thecal sac. Thecal sac measures 4-5 mm in AP diameter at its most narrow point. Mild-to-moderate bilateral L3 foraminal narrowing, right slightly worse than left. L4-5: Mild diffuse disc bulge with disc desiccation. Prominent reactive endplate osteophytic spurring. Moderate facet and ligament flavum hypertrophy. Mild prominence of the dorsal epidural fat. Resultant moderate canal with severe bilateral subarticular stenosis. Severe left with moderate right L4 foraminal narrowing. L5-S1: Trace anterolisthesis. Mild disc bulge with disc desiccation. Endplate osteophytic spurring. Severe bilateral facet arthrosis.  Complex synovial cyst measuring 12 x 10 x 21 mm seen at the medial aspect of the right L5-S1 facet, encroaching upon the right lateral recess (series 12, image 33). Resultant moderate right lateral recess stenosis with displacement of the descending right S2 nerve root. Thecal sac remains patent. Initial 12 mm synovial cyst seen at the anterior aspect of the right L5-S1 facet at the level of the right neural foramen (series 12, image 30). Resultant mild left with moderate right L5 foraminal stenosis. IMPRESSION: 1. Multifactorial degenerative changes at L2-3 through L4-5 with resultant severe canal with bilateral subarticular stenosis as above, most pronounced at L3-4. 2. 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 3. Moderate to severe bilateral L2 through L5 foraminal stenosis as detailed above. Electronically Signed   By: Rise Mu M.D.   On: 01/14/2020 19:42   DG Chest Portable 1 View  Result Date: 01/14/2020 CLINICAL DATA:  Weakness. EXAM: PORTABLE CHEST 1 VIEW COMPARISON:  None. FINDINGS: Upper normal heart size. Normal mediastinal contours. Coronary artery calcifications versus stent. Streaky and bandlike opacities at the left lung base. No confluent consolidation. No pulmonary edema, pneumothorax, or large pleural effusion. No acute osseous abnormalities are seen. IMPRESSION: Streaky and bandlike opacities at the left lung base, favor atelectasis. Electronically Signed   By: Narda Rutherford M.D.   On: 01/14/2020 18:36   ECHOCARDIOGRAM COMPLETE  Result Date: 01/15/2020    ECHOCARDIOGRAM REPORT   Patient Name:   Roy [name]. Date of Exam: 01/15/2020 Medical Rec #:  [id number]             Height:       75.0 in Accession #:    [id number]            Weight:       252.0 lb Date of Birth:  1946/03/08             BSA:          2.421 m Patient Age:    74 years               BP:           160/92 mmHg Patient Gender: M                     HR:           71 bpm. Exam Location:  [facility] Procedure: 2D Echo, Color Doppler, Cardiac Doppler and Intracardiac            Opacification Agent Indications:     Elevated troponin  History:         Patient has no prior history of Echocardiogram examinations.                  Risk Factors:Hypertension and Diabetes.  Sonographer:     Humphrey Rolls  RDCS (AE) Referring Phys:  [id number] Kendell Bane Diagnosing Phys: Cristal Deer End MD  Sonographer Comments: Suboptimal apical window and suboptimal subcostal window. Image acquisition challenging due to patient body habitus. IMPRESSIONS  1. Left ventricular ejection fraction, by estimation, is 60 to 65%. The left ventricle has normal function. The left ventricle has no regional wall motion abnormalities. There is mild left ventricular hypertrophy. Left ventricular diastolic parameters are consistent with Grade I diastolic dysfunction (impaired relaxation).  2. Right ventricular systolic function is normal. The right ventricular size is normal.  3. The mitral valve is normal in structure. Trivial mitral valve regurgitation. No evidence of mitral stenosis.  4. The aortic valve was not well visualized. Aortic valve regurgitation is not visualized. No aortic stenosis is present.  5. Aortic dilatation noted. There is borderline dilatation of the aortic root, measuring 38 mm.  6. The inferior vena cava is normal in size with greater than 50% respiratory variability, suggesting right atrial pressure of 3 mmHg. FINDINGS  Left Ventricle: Left ventricular ejection fraction, by estimation, is 60 to 65%. The left ventricle has normal function. The left ventricle has no regional wall motion abnormalities. Definity contrast agent was given IV to delineate the left ventricular  endocardial borders. The left ventricular internal cavity size was normal in size. There is mild left ventricular hypertrophy. Left ventricular diastolic  parameters are consistent with Grade I diastolic dysfunction (impaired relaxation). Right Ventricle: The right ventricular size is normal. No increase in right ventricular wall thickness. Right ventricular systolic function is normal. Left Atrium: Left atrial size was normal in size. Right Atrium: Right atrial size was not well visualized. Pericardium: There is no evidence of pericardial effusion. Mitral Valve: The mitral valve is normal in structure. Trivial mitral valve regurgitation. No evidence of mitral valve stenosis. MV peak gradient, 3.5 mmHg. The mean mitral valve gradient is 1.0 mmHg. Tricuspid Valve: The tricuspid valve is not well visualized. Tricuspid valve regurgitation is not demonstrated. Aortic Valve: The aortic valve was not well visualized. Aortic valve regurgitation is not visualized. No aortic stenosis is present. Aortic valve mean gradient measures 4.0 mmHg. Aortic valve peak gradient measures 8.0 mmHg. Aortic valve area, by VTI measures 1.95 cm. Pulmonic Valve: The pulmonic valve was not well visualized. Pulmonic valve regurgitation is not visualized. No evidence of pulmonic stenosis. Aorta: Aortic dilatation noted. There is borderline dilatation of the aortic root, measuring 38 mm. Pulmonary Artery: The pulmonary artery is not well seen. Venous: The inferior vena cava is normal in size with greater than 50% respiratory variability, suggesting right atrial pressure of 3 mmHg. IAS/Shunts: The interatrial septum was not well visualized.  LEFT VENTRICLE PLAX 2D LVIDd:         5.44 cm  Diastology LVIDs:         4.35 cm  LV e' medial:    5.11 cm/s LV PW:         1.25 cm  LV E/e' medial:  10.2 LV IVS:        1.38 cm  LV e' lateral:   3.81 cm/s LVOT diam:     2.20 cm  LV E/e' lateral: 13.6 LV SV:         49 LV SV Index:   20 LVOT Area:     3.80 cm  LEFT ATRIUM             Index LA diam:        4.60 cm 1.90 cm/m LA Vol (A2C):  57.0 ml 23.55 ml/m LA Vol (A4C):   54.2 ml 22.39 ml/m LA Biplane Vol:  56.5 ml 23.34 ml/m  AORTIC VALVE                   PULMONIC VALVE AV Area (Vmax):    2.23 cm    PV Vmax:       0.93 m/s AV Area (Vmean):   2.00 cm    PV Vmean:      61.500 cm/s AV Area (VTI):     1.95 cm    PV VTI:        0.181 m AV Vmax:           141.00 cm/s PV Peak grad:  3.5 mmHg AV Vmean:          97.700 cm/s PV Mean grad:  2.0 mmHg AV VTI:            0.254 m AV Peak Grad:      8.0 mmHg AV Mean Grad:      4.0 mmHg LVOT Vmax:         82.80 cm/s LVOT Vmean:        51.500 cm/s LVOT VTI:          0.130 m LVOT/AV VTI ratio: 0.51  AORTA Ao Root diam: 3.80 cm MITRAL VALVE MV Area (PHT): 6.96 cm    SHUNTS MV Peak grad:  3.5 mmHg    Systemic VTI:  0.13 m MV Mean grad:  1.0 mmHg    Systemic Diam: 2.20 cm MV Vmax:       0.93 m/s MV Vmean:      55.6 cm/s MV Decel Time: 109 msec MV E velocity: 51.90 cm/s MV A velocity: 71.80 cm/s MV E/A ratio:  0.72 Cristal Deer End MD Electronically signed by Yvonne Kendall MD Signature Date/Time: 01/15/2020/12:29:39 PM    Final     Scheduled Meds: . amLODipine  5 mg Oral Daily  . aspirin EC  81 mg Oral QPM  . brimonidine  1 drop Right Eye BID  . carvedilol  6.25 mg Oral BID WC  . enoxaparin (LOVENOX) injection  40 mg Subcutaneous Q24H  . insulin aspart  0-15 Units Subcutaneous TID WC  . insulin aspart  0-5 Units Subcutaneous QHS  . insulin detemir  10 Units Subcutaneous QHS  . latanoprost  1 drop Right Eye QHS  . multivitamin with minerals  1 tablet Oral Daily  . polyethylene glycol  17 g Oral Daily  . Ensure Max Protein  11 oz Oral TID  . simvastatin  20 mg Oral Daily  . cyanocobalamin  2,000 mcg Oral Daily   Continuous Infusions:   LOS: 2 days   Time spent: 35 minutes.  Arnetha Courser, MD [facility]  If 7PM-7AM, please contact night-coverage [email or web address]  01/16/2020, 1:18 PM   This record has been created using Conservation officer, historic buildings. Errors have been sought and corrected,but may not always be located. Such creation errors do not  reflect on the standard of care.

## 2020-01-17 DIAGNOSIS — M48061 Spinal stenosis, lumbar region without neurogenic claudication: Secondary | ICD-10-CM | POA: Diagnosis not present

## 2020-01-17 DIAGNOSIS — R531 Weakness: Secondary | ICD-10-CM | POA: Diagnosis not present

## 2020-01-17 DIAGNOSIS — I739 Peripheral vascular disease, unspecified: Secondary | ICD-10-CM | POA: Diagnosis not present

## 2020-01-17 DIAGNOSIS — Z87898 Personal history of other specified conditions: Secondary | ICD-10-CM | POA: Diagnosis not present

## 2020-01-17 LAB — GLUCOSE, CAPILLARY
Glucose-Capillary: 133 mg/dL — ABNORMAL HIGH (ref 70–99)
Glucose-Capillary: 158 mg/dL — ABNORMAL HIGH (ref 70–99)
Glucose-Capillary: 195 mg/dL — ABNORMAL HIGH (ref 70–99)
Glucose-Capillary: 229 mg/dL — ABNORMAL HIGH (ref 70–99)

## 2020-01-17 MED ORDER — VITAMIN D 25 MCG (1000 UNIT) PO TABS
1000.0000 [IU] | ORAL_TABLET | Freq: Every day | ORAL | Status: DC
  Administered 2020-01-17 – 2020-01-25 (×9): 1000 [IU] via ORAL
  Filled 2020-01-17 (×9): qty 1

## 2020-01-17 NOTE — Care Management Important Message (Signed)
Important Message  Patient Details  Name: Roy [name]. MRN: [id number] Date of Birth: [birth year]   Medicare Important Message Given:  Yes     [name] 01/17/2020, 2:12 PM

## 2020-01-17 NOTE — Progress Notes (Signed)
PROGRESS NOTE    Roy [name].  SAY:[id number] DOB: January 11, 1946 DOA: 01/14/2020 PCP: Rigoberto Noel, MD   Brief Narrative: Taken from H&P Roy [name] a 74 y.o.mw h/o DM, HTN,  presents to ED forevaluation of bilateral lower extremity weakness. Patient attempted to use the bathroom this morning but was so weak in his lower extremities that he could not stand from the toilet. He reports severe progressively worsening bilateral lower extremity weakness despite getting home physical therapy. Patient reports associated low back pain which radiates down the backs of both legs.  Per wife patient has progressively worsening lower extremity weakness and overall declined in his have for the past 1 year.  Up till few weeks ago he was able to walk around the house with walker which is becoming difficult now.  For the past couple of weeks he is spending most of the time in chair and having frequent falls. Patient has history of spinal stenosis and diabetic neuropathy involving both lower extremities. Neurosurgery was also consulted.  Subjective: Patient has no new complaint today except lower extremity weakness.  He wants to sit at the site of the bed and asking for help. Wife and he was little frustrated that they have not heard anything back from neurosurgery.  They are under the impression that he needs some procedure. Sent message to Dr. Adriana Simas earlier this morning-waiting for response.  Assessment & Plan:   Active Problems:   Lower extremity weakness   Weakness   Spinal stenosis of lumbar region   History of progressive weakness   Claudication (HCC)  Bilateral lower extremity weakness with gait instability.  Patient is unable to walk due to worsening lower extremity weakness.  Multiple etiologies can be contributory which include severe spinal stenosis, diabetic neuropathy and a possible vasculopathy.  Neurosurgery was consulted and they recommend doing a thoracic MRI and  ABI-waiting their comment on thoracic MRI which also shows stenosis and some disc bulging.  ABI did not show any vascular disease. Good strength on dorsiflexion and plantarflexion on bilateral feet, poor effort to lift the leg up stating that he cannot do it. B12 came back within normal limit, vitamin D low. -PT is recommending SNF placement,-TOC to work on it. -Continue with pain management. -Start him on vitamin D supplement. -Continue vitamin B12 supplement -Awaiting response from neurosurgery as patient is under the impression that he will need a procedure.  Elevated troponin.  Mildly elevated troponin with a flat curve.  No chest pain. Most likely secondary to demand.  Echocardiogram obtained yesterday with normal EF, grade 1 diastolic dysfunction and no regional wall motion abnormalities.  Type 2 diabetes mellitus.  CBG within goal after addition of Levemir. Patient was on Metformin and Levemir at home.  A1c in June 2021 was 6.5 Repeat A1c yesterday was 5.8. -Continue Levemir 10 units at bedtime. -Continue with SSI.  Hypertension.  Blood pressure within goal. -Continue home dose of Coreg and amlodipine.  Hyperlipidemia. -Continue home dose of Zocor  History of glaucoma. -Continue home eye drops  Objective: Vitals:   01/17/20 0404 01/17/20 0433 01/17/20 0736 01/17/20 1236  BP: 120/76 130/78 119/70 133/80  Pulse: 72 70 69 79  Resp: 17  15 16   Temp: 98.1 F (36.7 C) 98.8 F (37.1 C) 98.9 F (37.2 C) 98.3 F (36.8 C)  TempSrc:  Oral Oral Oral  SpO2: 95% 98% 96% 97%  Weight: 116.9 kg     Height:        Intake/Output Summary (  Last 24 hours) at 01/17/2020 1422 Last data filed at 01/17/2020 1420 Gross per 24 hour  Intake 720 ml  Output --  Net 720 ml   Filed Weights   01/15/20 1341 01/16/20 0353 01/17/20 0404  Weight: 114.4 kg 114.6 kg 116.9 kg    Examination:  General.  Well-developed gentleman, in no acute distress. Pulmonary.  Lungs clear bilaterally, normal  respiratory effort. CV.  Regular rate and rhythm, no JVD, rub or murmur. Abdomen.  Soft, nontender, nondistended, BS positive. CNS.  Alert and oriented x3.  No focal neurologic deficit. Extremities.  No edema, no cyanosis, pulses intact and symmetrical. Psychiatry.  Judgment and insight appears normal.  DVT prophylaxis: Lovenox Code Status: Full Family Communication: Wife was updated at bedside Disposition Plan:  Status is: Inpatient  Remains inpatient appropriate because:Inpatient level of care appropriate due to severity of illness   Dispo: The patient is from: Home              Anticipated d/c is to: SNF              Anticipated d/c date is: 2 days              Patient currently is medically stable.  Waiting from neurosurgeon to decide about whether he needs any procedure.  Sent messages to Dr. Adriana Simas and Dr. Myer Haff.   Consultants:   Neurosurgery  Procedures:  Antimicrobials:   Data Reviewed: I have personally reviewed following labs and imaging studies  CBC: Recent Labs  Lab 01/14/20 1143 01/15/20 0348  WBC 9.9 8.9  HGB 15.6 15.5  HCT 47.1 46.0  MCV 95.2 95.0  PLT 197 181   Basic Metabolic Panel: Recent Labs  Lab 01/14/20 1143 01/14/20 2154 01/15/20 0348  NA 141  --  142  K 3.9  --  3.6  CL 104  --  104  CO2 27  --  27  GLUCOSE 133*  --  118*  BUN 20  --  19  CREATININE 1.07  --  0.93  CALCIUM 9.9  --  9.7  MG  --  1.7  --   PHOS  --  2.7  --    GFR: Estimated Creatinine Clearance: 97.4 mL/min (by C-G formula based on SCr of 0.93 mg/dL). Liver Function Tests: No results for input(s): AST, ALT, ALKPHOS, BILITOT, PROT, ALBUMIN in the last 168 hours. No results for input(s): LIPASE, AMYLASE in the last 168 hours. No results for input(s): AMMONIA in the last 168 hours. Coagulation Profile: No results for input(s): INR, PROTIME in the last 168 hours. Cardiac Enzymes: No results for input(s): CKTOTAL, CKMB, CKMBINDEX, TROPONINI in the last 168  hours. BNP (last 3 results) No results for input(s): PROBNP in the last 8760 hours. HbA1C: Recent Labs    01/16/20 0947  HGBA1C 5.8*   CBG: Recent Labs  Lab 01/16/20 1300 01/16/20 1741 01/16/20 2105 01/17/20 0739 01/17/20 1200  GLUCAP 164* 197* 240* 133* 158*   Lipid Profile: Recent Labs    01/14/20 2154  CHOL 177  HDL 59  LDLCALC 95  TRIG 116  CHOLHDL 3.0   Thyroid Function Tests: Recent Labs    01/14/20 2154  TSH 1.101   Anemia Panel: Recent Labs    01/16/20 0947  VITAMINB12 811   Sepsis Labs: No results for input(s): PROCALCITON, LATICACIDVEN in the last 168 hours.  Recent Results (from the past 240 hour(s))  Respiratory Panel by RT PCR (Flu A&B, Covid) - Nasopharyngeal Swab  Status: None   Collection Time: 01/14/20  8:52 PM   Specimen: Nasopharyngeal Swab  Result Value Ref Range Status   SARS Coronavirus 2 by RT PCR NEGATIVE NEGATIVE Final    Comment: (NOTE) SARS-CoV-2 target nucleic acids are NOT DETECTED.  The SARS-CoV-2 RNA is generally detectable in upper respiratoy specimens during the acute phase of infection. The lowest concentration of SARS-CoV-2 viral copies this assay can detect is 131 copies/mL. A negative result does not preclude SARS-Cov-2 infection and should not be used as the sole basis for treatment or other patient management decisions. A negative result may occur with  improper specimen collection/handling, submission of specimen other than nasopharyngeal swab, presence of viral mutation(s) within the areas targeted by this assay, and inadequate number of viral copies (<131 copies/mL). A negative result must be combined with clinical observations, patient history, and epidemiological information. The expected result is Negative.  Fact Sheet for Patients:  https://www.moore.com/  Fact Sheet for Healthcare Providers:  https://www.young.biz/  This test is no t yet approved or cleared  by the [place] FDA and  has been authorized for detection and/or diagnosis of SARS-CoV-2 by FDA under an Emergency Use Authorization (EUA). This EUA will remain  in effect (meaning this test can be used) for the duration of the COVID-19 declaration under Section 564(b)(1) of the Act, 21 U.S.C. section 360bbb-3(b)(1), unless the authorization is terminated or revoked sooner.     Influenza A by PCR NEGATIVE NEGATIVE Final   Influenza B by PCR NEGATIVE NEGATIVE Final    Comment: (NOTE) The Xpert Xpress SARS-CoV-2/FLU/RSV assay is intended as an aid in  the diagnosis of influenza from Nasopharyngeal swab specimens and  should not be used as a sole basis for treatment. Nasal washings and  aspirates are unacceptable for Xpert Xpress SARS-CoV-2/FLU/RSV  testing.  Fact Sheet for Patients: https://www.moore.com/  Fact Sheet for Healthcare Providers: https://www.young.biz/  This test is not yet approved or cleared by the [place] FDA and  has been authorized for detection and/or diagnosis of SARS-CoV-2 by  FDA under an Emergency Use Authorization (EUA). This EUA will remain  in effect (meaning this test can be used) for the duration of the  Covid-19 declaration under Section 564(b)(1) of the Act, 21  U.S.C. section 360bbb-3(b)(1), unless the authorization is  terminated or revoked. Performed at [facility], [address], [place], [place] [address]      Radiology Studies: US ARTERIAL ABI (SCREENING LOWER EXTREMITY)  Result Date: 01/17/2020 CLINICAL DATA:  Claudication.  Diabetes, hypertension, obesity EXAM: NONINVASIVE PHYSIOLOGIC VASCULAR STUDY OF BILATERAL LOWER EXTREMITIES TECHNIQUE: Evaluation of both lower extremities were performed at rest, including calculation of ankle-brachial indices with single level Doppler, pressure recording. COMPARISON:  None. FINDINGS: Right ABI:  1.17 Left ABI:  1.02 Right Lower Extremity:  Normal arterial waveforms at the ankle. A nonspecific cardiac arrhythmia is noted. Left Lower Extremity:  Normal arterial waveforms at the ankle. IMPRESSION: No evidence of hemodynamically significant lower extremity arterial occlusive disease at rest. Electronically Signed   By: Corlis Leak M.D.   On: 01/17/2020 08:04    Scheduled Meds: . amLODipine  5 mg Oral Daily  . aspirin EC  81 mg Oral QPM  . brimonidine  1 drop Right Eye BID  . carvedilol  6.25 mg Oral BID WC  . cholecalciferol  1,000 Units Oral Daily  . enoxaparin (LOVENOX) injection  40 mg Subcutaneous Q24H  . insulin aspart  0-15 Units Subcutaneous TID WC  .  insulin aspart  0-5 Units Subcutaneous QHS  . insulin detemir  10 Units Subcutaneous QHS  . latanoprost  1 drop Right Eye QHS  . multivitamin with minerals  1 tablet Oral Daily  . polyethylene glycol  17 g Oral Daily  . Ensure Max Protein  11 oz Oral TID  . simvastatin  20 mg Oral Daily  . cyanocobalamin  2,000 mcg Oral Daily   Continuous Infusions:   LOS: 3 days   Time spent: 25 minutes.  Arnetha Courser, MD [facility]  If 7PM-7AM, please contact night-coverage [email or web address]  01/17/2020, 2:22 PM   This record has been created using Conservation officer, historic buildings. Errors have been sought and corrected,but may not always be located. Such creation errors do not reflect on the standard of care.

## 2020-01-17 NOTE — NC FL2 (Addendum)
[place] MEDICAID FL2 LEVEL OF CARE SCREENING TOOL     IDENTIFICATION  Patient Name: Roy [name]. Birthdate: September 13, 1945 Sex: male Admission Date (Current Location): 01/14/2020  [place] and [place] Number:  Chiropodist and Address:  [facility], [address] [place], [place] [address]      Provider Number: [id number]  Attending Physician Name and Address:  Arnetha Courser, MD  Relative Name and Phone Number:  Kellie Chisolm  [phone]    Current Level of Care: Hospital Recommended Level of Care: Skilled Nursing Facility Prior Approval Number:    Date Approved/Denied:   PASRR Number:  [id number]  Discharge Plan: SNF    Current Diagnoses: Patient Active Problem List   Diagnosis Date Noted  . Weakness   . Spinal stenosis of lumbar region   . History of progressive weakness   . Claudication (HCC)   . Lower extremity weakness 01/14/2020    Orientation RESPIRATION BLADDER Height & Weight     Self, Time, Situation, Place  Normal Incontinent, Continent Weight: 116.9 kg Height:  6\' 4"  (193 cm)  BEHAVIORAL SYMPTOMS/MOOD NEUROLOGICAL BOWEL NUTRITION STATUS      Continent Diet (Regular)  AMBULATORY STATUS COMMUNICATION OF NEEDS Skin   Extensive Assist Verbally Normal                       Personal Care Assistance Level of Assistance  Bathing, Dressing Bathing Assistance: Maximum assistance   Dressing Assistance: Maximum assistance     Functional Limitations Info  Sight Sight Info: Impaired (glasses)        SPECIAL CARE FACTORS FREQUENCY  PT (By licensed PT), OT (By licensed OT)     PT Frequency: 5x a week OT Frequency: 5x a week            Contractures Contractures Info: Not present    Additional Factors Info  Code Status Code Status Info: Full             Current Medications (01/17/2020):  This is the current hospital active medication list Current Facility-Administered Medications   Medication Dose Route Frequency Provider Last Rate Last Admin  . acetaminophen (TYLENOL) tablet 650 mg  650 mg Oral Q6H PRN Hugelmeyer, Alexis, DO       Or  . acetaminophen (TYLENOL) suppository 650 mg  650 mg Rectal Q6H PRN Hugelmeyer, Alexis, DO      . albuterol (PROVENTIL) (2.5 MG/3ML) 0.083% nebulizer solution 2.5 mg  2.5 mg Nebulization Q6H PRN Hugelmeyer, Alexis, DO      . amLODipine (NORVASC) tablet 5 mg  5 mg Oral Daily 03/18/2020, MD   5 mg at 01/17/20 0907  . aspirin EC tablet 81 mg  81 mg Oral QPM 03/18/20, MD   81 mg at 01/16/20 1743  . bisacodyl (DULCOLAX) EC tablet 5 mg  5 mg Oral Daily PRN Hugelmeyer, Alexis, DO      . brimonidine (ALPHAGAN) 0.2 % ophthalmic solution 1 drop  1 drop Right Eye BID 03/17/20, MD   1 drop at 01/17/20 0914  . carvedilol (COREG) tablet 6.25 mg  6.25 mg Oral BID WC Shahmehdi, Seyed A, MD   6.25 mg at 01/17/20 0907  . cholecalciferol (VITAMIN D3) tablet 1,000 Units  1,000 Units Oral Daily 03/18/20, MD   1,000 Units at 01/17/20 1232  . enoxaparin (LOVENOX) injection 40 mg  40 mg Subcutaneous Q24H Hugelmeyer, Alexis, DO   40 mg at 01/16/20 2216  .  famotidine (PEPCID) tablet 20 mg  20 mg Oral QHS PRN Arnetha Courser, MD      . insulin aspart (novoLOG) injection 0-15 Units  0-15 Units Subcutaneous TID WC Hugelmeyer, Alexis, DO   3 Units at 01/17/20 1231  . insulin aspart (novoLOG) injection 0-5 Units  0-5 Units Subcutaneous QHS Hugelmeyer, Alexis, DO   2 Units at 01/16/20 2216  . insulin detemir (LEVEMIR) injection 10 Units  10 Units Subcutaneous QHS Arnetha Courser, MD   10 Units at 01/16/20 2217  . ipratropium (ATROVENT) nebulizer solution 0.5 mg  0.5 mg Nebulization Q6H PRN Hugelmeyer, Alexis, DO      . latanoprost (XALATAN) 0.005 % ophthalmic solution 1 drop  1 drop Right Eye Lind Covert, MD   1 drop at 01/16/20 2217  . melatonin tablet 5-10 mg  5-10 mg Oral QHS PRN Arnetha Courser, MD   5 mg at 01/16/20 2218  . morphine 2 MG/ML injection  2 mg  2 mg Intravenous Q4H PRN Hugelmeyer, Alexis, DO      . multivitamin with minerals tablet 1 tablet  1 tablet Oral Daily Arnetha Courser, MD   1 tablet at 01/17/20 0906  . ondansetron (ZOFRAN) tablet 4 mg  4 mg Oral Q6H PRN Hugelmeyer, Alexis, DO       Or  . ondansetron (ZOFRAN) injection 4 mg  4 mg Intravenous Q6H PRN Hugelmeyer, Alexis, DO      . polyethylene glycol (MIRALAX / GLYCOLAX) packet 17 g  17 g Oral Daily Arnetha Courser, MD   17 g at 01/17/20 0904  . protein supplement (ENSURE MAX) liquid  11 oz Oral TID Arnetha Courser, MD   11 oz at 01/16/20 1744  . senna-docusate (Senokot-S) tablet 1 tablet  1 tablet Oral QHS PRN Hugelmeyer, Alexis, DO      . simvastatin (ZOCOR) tablet 20 mg  20 mg Oral Daily Hugelmeyer, Alexis, DO   20 mg at 01/17/20 0907  . traMADol (ULTRAM) tablet 50 mg  50 mg Oral Q6H PRN Hugelmeyer, Alexis, DO      . vitamin B-12 (CYANOCOBALAMIN) tablet 2,000 mcg  2,000 mcg Oral Daily Arnetha Courser, MD   2,000 mcg at 01/17/20 3903     Discharge Medications: Please see discharge summary for a list of discharge medications.  Relevant Imaging Results:  Relevant Lab Results:   Additional Information # [id number]  Shawn Route, RN

## 2020-01-17 NOTE — TOC Progression Note (Signed)
Transition of Care (TOC) - Progression Note    Patient Details  Name: Makhai Fulco. MRN: [id number] Date of Birth: Apr 25, 1945  Transition of Care [facility]) CM/SW Contact  Shawn Route, RN Phone Number: 01/17/2020, 4:44 PM  Clinical Narrative:     Talked with wife Myra and let her know I did send referral for Admission to [place] in [place].  Son visited facility today.  I spoke with [facility] Admissions Coordinator [phone]), Bed request faxed to [phone].  Bed Search in hub as well.   Expected Discharge Plan: Skilled Nursing Facility Barriers to Discharge: Continued Medical Work up  Expected Discharge Plan and Services Expected Discharge Plan: Skilled Nursing Facility In-house Referral: Clinical Social Work Discharge Planning Services: NA Post Acute Care Choice: Skilled Nursing Facility Living arrangements for the past 2 months: Single Family Home                 DME Arranged: N/A DME Agency: NA       HH Arranged: NA HH Agency: NA         Social Determinants of Health (SDOH) Interventions    Readmission Risk Interventions No flowsheet data found.

## 2020-01-17 NOTE — Plan of Care (Signed)

## 2020-01-18 DIAGNOSIS — M48061 Spinal stenosis, lumbar region without neurogenic claudication: Secondary | ICD-10-CM | POA: Diagnosis not present

## 2020-01-18 DIAGNOSIS — Z87898 Personal history of other specified conditions: Secondary | ICD-10-CM | POA: Diagnosis not present

## 2020-01-18 DIAGNOSIS — R531 Weakness: Secondary | ICD-10-CM | POA: Diagnosis not present

## 2020-01-18 DIAGNOSIS — I739 Peripheral vascular disease, unspecified: Secondary | ICD-10-CM | POA: Diagnosis not present

## 2020-01-18 LAB — CBC
HCT: 40.8 % (ref 39.0–52.0)
Hemoglobin: 13.8 g/dL (ref 13.0–17.0)
MCH: 32.3 pg (ref 26.0–34.0)
MCHC: 33.8 g/dL (ref 30.0–36.0)
MCV: 95.6 fL (ref 80.0–100.0)
Platelets: 160 10*3/uL (ref 150–400)
RBC: 4.27 MIL/uL (ref 4.22–5.81)
RDW: 12.5 % (ref 11.5–15.5)
WBC: 10.5 10*3/uL (ref 4.0–10.5)
nRBC: 0 % (ref 0.0–0.2)

## 2020-01-18 LAB — BASIC METABOLIC PANEL
Anion gap: 10 (ref 5–15)
BUN: 38 mg/dL — ABNORMAL HIGH (ref 8–23)
CO2: 25 mmol/L (ref 22–32)
Calcium: 9.3 mg/dL (ref 8.9–10.3)
Chloride: 103 mmol/L (ref 98–111)
Creatinine, Ser: 1.09 mg/dL (ref 0.61–1.24)
GFR calc non Af Amer: >60 mL/min (ref >60)
Glucose, Bld: 154 mg/dL — ABNORMAL HIGH (ref 70–99)
Potassium: 3.4 mmol/L — ABNORMAL LOW (ref 3.5–5.1)
Sodium: 138 mmol/L (ref 135–145)

## 2020-01-18 LAB — GLUCOSE, CAPILLARY
Glucose-Capillary: 159 mg/dL — ABNORMAL HIGH (ref 70–99)
Glucose-Capillary: 219 mg/dL — ABNORMAL HIGH (ref 70–99)
Glucose-Capillary: 203 mg/dL — ABNORMAL HIGH (ref 70–99)
Glucose-Capillary: 232 mg/dL — ABNORMAL HIGH (ref 70–99)
Glucose-Capillary: 208 mg/dL — ABNORMAL HIGH (ref 70–99)

## 2020-01-18 MED ORDER — TAMSULOSIN HCL 0.4 MG PO CAPS
0.4000 mg | ORAL_CAPSULE | Freq: Every day | ORAL | Status: DC
  Administered 2020-01-18 – 2020-01-24 (×7): 0.4 mg via ORAL
  Filled 2020-01-18 (×7): qty 1

## 2020-01-18 MED ORDER — INSULIN DETEMIR 100 UNIT/ML ~~LOC~~ SOLN
12.0000 [IU] | Freq: Every day | SUBCUTANEOUS | Status: DC
  Administered 2020-01-18 – 2020-01-24 (×7): 12 [IU] via SUBCUTANEOUS
  Filled 2020-01-18 (×8): qty 0.12

## 2020-01-18 MED ORDER — CHLORHEXIDINE GLUCONATE CLOTH 2 % EX PADS: 6.0000 | MEDICATED_PAD | Freq: Every day | CUTANEOUS | Status: DC

## 2020-01-18 MED ORDER — POTASSIUM CHLORIDE CRYS ER 20 MEQ PO TBCR
40.0000 meq | EXTENDED_RELEASE_TABLET | Freq: Once | ORAL | Status: AC
  Administered 2020-01-18: 40 meq via ORAL
  Filled 2020-01-18: qty 2

## 2020-01-18 NOTE — Progress Notes (Signed)
PROGRESS NOTE    Roy [name].  GEZ:[id number] DOB: 1945-04-30 DOA: 01/14/2020 PCP: Rigoberto Noel, MD   Brief Narrative: Taken from H&P [name] a 74 y.o.mw h/o DM, HTN,  presents to ED forevaluation of bilateral lower extremity weakness. Patient attempted to use the bathroom this morning but was so weak in his lower extremities that he could not stand from the toilet. He reports severe progressively worsening bilateral lower extremity weakness despite getting home physical therapy. Patient reports associated low back pain which radiates down the backs of both legs.  Per wife patient has progressively worsening lower extremity weakness and overall declined in his have for the past 1 year.  Up till few weeks ago he was able to walk around the house with walker which is becoming difficult now.  For the past couple of weeks he is spending most of the time in chair and having frequent falls. Patient has history of spinal stenosis and diabetic neuropathy involving both lower extremities. Neurosurgery was also consulted.  Subjective: Patient developed urinary retention overnight.  Bladder scan with more than 1000.  In and out catheter was done resulted in draining of more than 1500 cc of urine.  Patient did not void since that in and out catheter was done around 1 AM.  Repeat bladder scan with 300 cc, still unable to void.  We will try at bedside commode.  Assessment & Plan:   Active Problems:   Lower extremity weakness   Weakness   Spinal stenosis of lumbar region   History of progressive weakness   Claudication (HCC)  Bilateral lower extremity weakness with gait instability.  Patient is unable to walk due to worsening lower extremity weakness.  Multiple etiologies can be contributory which include severe spinal stenosis, diabetic neuropathy and a possible vasculopathy.  Neurosurgery was consulted and they recommend doing a thoracic MRI and ABI-waiting their comment on  thoracic MRI which also shows stenosis and some disc bulging.  ABI did not show any vascular disease. Good strength on dorsiflexion and plantarflexion on bilateral feet, poor effort to lift the leg up stating that he cannot do it. B12 came back within normal limit, vitamin D low. -PT is recommending SNF placement,-TOC to work on it. -Continue with pain management. -Start him on vitamin D supplement. -Continue vitamin B12 supplement -Neurosurgery wants to follow-up as an outpatient-not recommending any procedure during current hospitalization.  Urinary retention.  Patient with new urinary retention and inability to void.  Not on any BPH medication before. -Encouraged voiding by sitting on the commode instead of laying down.  If unable to void and bladder scan shows more than 500 cc he will need a Foley catheter and a outpatient urology consult. -Start him on Flomax.  Elevated troponin.  Mildly elevated troponin with a flat curve.  No chest pain. Most likely secondary to demand.  Echocardiogram obtained yesterday with normal EF, grade 1 diastolic dysfunction and no regional wall motion abnormalities.  Type 2 diabetes mellitus.  CBG mildly elevated. Patient was on Metformin and Levemir at home.  A1c in June 2021 was 6.5 Repeat A1c yesterday was 5.8. -Increase Levemir to 12  units at bedtime. -Continue with SSI.  Hypertension.  Blood pressure within goal. -Continue home dose of Coreg and amlodipine.  Hyperlipidemia. -Continue home dose of Zocor  History of glaucoma. -Continue home eye drops  Objective: Vitals:   01/17/20 1957 01/18/20 0413 01/18/20 0751 01/18/20 1139  BP: (!) 136/51 110/65 139/85 110/62  Pulse: 73  71 79 70  Resp: 18 18 17 17   Temp: 98.4 F (36.9 C) 98.7 F (37.1 C) 98.3 F (36.8 C) 98.2 F (36.8 C)  TempSrc: Oral Oral Oral Oral  SpO2: 95% 95% 95% 99%  Weight:  116.8 kg    Height:        Intake/Output Summary (Last 24 hours) at 01/18/2020 1437 Last data  filed at 01/18/2020 1030 Gross per 24 hour  Intake 240 ml  Output 1700 ml  Net -1460 ml   Filed Weights   01/16/20 0353 01/17/20 0404 01/18/20 0413  Weight: 114.6 kg 116.9 kg 116.8 kg    Examination:  General.  Well-developed obese gentleman, in no acute distress. Pulmonary.  Lungs clear bilaterally, normal respiratory effort. CV.  Regular rate and rhythm, no JVD, rub or murmur. Abdomen.  Soft, nontender, nondistended, BS positive. CNS.  Alert and oriented x3.  No focal neurologic deficit. Extremities.  No edema, no cyanosis, pulses intact and symmetrical. Psychiatry.  Judgment and insight appears normal.  DVT prophylaxis: Lovenox Code Status: Full Family Communication: Wife was updated at bedside Disposition Plan:  Status is: Inpatient  Remains inpatient appropriate because:Inpatient level of care appropriate due to severity of illness   Dispo: The patient is from: Home              Anticipated d/c is to: SNF              Anticipated d/c date is: 2 days              Patient currently is medically stable.  TOC is working on bed search.  Consultants:   Neurosurgery  Procedures:  Antimicrobials:   Data Reviewed: I have personally reviewed following labs and imaging studies  CBC: Recent Labs  Lab 01/14/20 1143 01/15/20 0348 01/18/20 0447  WBC 9.9 8.9 10.5  HGB 15.6 15.5 13.8  HCT 47.1 46.0 40.8  MCV 95.2 95.0 95.6  PLT 197 181 160   Basic Metabolic Panel: Recent Labs  Lab 01/14/20 1143 01/14/20 2154 01/15/20 0348 01/18/20 0448  NA 141  --  142 138  K 3.9  --  3.6 3.4*  CL 104  --  104 103  CO2 27  --  27 25  GLUCOSE 133*  --  118* 154*  BUN 20  --  19 38*  CREATININE 1.07  --  0.93 1.09  CALCIUM 9.9  --  9.7 9.3  MG  --  1.7  --   --   PHOS  --  2.7  --   --    GFR: Estimated Creatinine Clearance: 83.1 mL/min (by C-G formula based on SCr of 1.09 mg/dL). Liver Function Tests: No results for input(s): AST, ALT, ALKPHOS, BILITOT, PROT, ALBUMIN in  the last 168 hours. No results for input(s): LIPASE, AMYLASE in the last 168 hours. No results for input(s): AMMONIA in the last 168 hours. Coagulation Profile: No results for input(s): INR, PROTIME in the last 168 hours. Cardiac Enzymes: No results for input(s): CKTOTAL, CKMB, CKMBINDEX, TROPONINI in the last 168 hours. BNP (last 3 results) No results for input(s): PROBNP in the last 8760 hours. HbA1C: Recent Labs    01/16/20 0947  HGBA1C 5.8*   CBG: Recent Labs  Lab 01/17/20 1200 01/17/20 1631 01/17/20 2024 01/18/20 0751 01/18/20 1140  GLUCAP 158* 195* 229* 159* 219*   Lipid Profile: No results for input(s): CHOL, HDL, LDLCALC, TRIG, CHOLHDL, LDLDIRECT in the last 72 hours. Thyroid Function Tests: No results  for input(s): TSH, T4TOTAL, FREET4, T3FREE, THYROIDAB in the last 72 hours. Anemia Panel: Recent Labs    01/16/20 0947  VITAMINB12 811   Sepsis Labs: No results for input(s): PROCALCITON, LATICACIDVEN in the last 168 hours.  Recent Results (from the past 240 hour(s))  Respiratory Panel by RT PCR (Flu A&B, Covid) - Nasopharyngeal Swab     Status: None   Collection Time: 01/14/20  8:52 PM   Specimen: Nasopharyngeal Swab  Result Value Ref Range Status   SARS Coronavirus 2 by RT PCR NEGATIVE NEGATIVE Final    Comment: (NOTE) SARS-CoV-2 target nucleic acids are NOT DETECTED.  The SARS-CoV-2 RNA is generally detectable in upper respiratoy specimens during the acute phase of infection. The lowest concentration of SARS-CoV-2 viral copies this assay can detect is 131 copies/mL. A negative result does not preclude SARS-Cov-2 infection and should not be used as the sole basis for treatment or other patient management decisions. A negative result may occur with  improper specimen collection/handling, submission of specimen other than nasopharyngeal swab, presence of viral mutation(s) within the areas targeted by this assay, and inadequate number of viral copies (<131  copies/mL). A negative result must be combined with clinical observations, patient history, and epidemiological information. The expected result is Negative.  Fact Sheet for Patients:  https://www.moore.com/  Fact Sheet for Healthcare Providers:  https://www.young.biz/  This test is no t yet approved or cleared by the [place] FDA and  has been authorized for detection and/or diagnosis of SARS-CoV-2 by FDA under an Emergency Use Authorization (EUA). This EUA will remain  in effect (meaning this test can be used) for the duration of the COVID-19 declaration under Section 564(b)(1) of the Act, 21 U.S.C. section 360bbb-3(b)(1), unless the authorization is terminated or revoked sooner.     Influenza A by PCR NEGATIVE NEGATIVE Final   Influenza B by PCR NEGATIVE NEGATIVE Final    Comment: (NOTE) The Xpert Xpress SARS-CoV-2/FLU/RSV assay is intended as an aid in  the diagnosis of influenza from Nasopharyngeal swab specimens and  should not be used as a sole basis for treatment. Nasal washings and  aspirates are unacceptable for Xpert Xpress SARS-CoV-2/FLU/RSV  testing.  Fact Sheet for Patients: https://www.moore.com/  Fact Sheet for Healthcare Providers: https://www.young.biz/  This test is not yet approved or cleared by the [place] FDA and  has been authorized for detection and/or diagnosis of SARS-CoV-2 by  FDA under an Emergency Use Authorization (EUA). This EUA will remain  in effect (meaning this test can be used) for the duration of the  Covid-19 declaration under Section 564(b)(1) of the Act, 21  U.S.C. section 360bbb-3(b)(1), unless the authorization is  terminated or revoked. Performed at [facility], [address], [place], [place] [address]      Radiology Studies: US ARTERIAL ABI (SCREENING LOWER EXTREMITY)  Result Date: 01/17/2020 CLINICAL DATA:  Claudication.   Diabetes, hypertension, obesity EXAM: NONINVASIVE PHYSIOLOGIC VASCULAR STUDY OF BILATERAL LOWER EXTREMITIES TECHNIQUE: Evaluation of both lower extremities were performed at rest, including calculation of ankle-brachial indices with single level Doppler, pressure recording. COMPARISON:  None. FINDINGS: Right ABI:  1.17 Left ABI:  1.02 Right Lower Extremity: Normal arterial waveforms at the ankle. A nonspecific cardiac arrhythmia is noted. Left Lower Extremity:  Normal arterial waveforms at the ankle. IMPRESSION: No evidence of hemodynamically significant lower extremity arterial occlusive disease at rest. Electronically Signed   By: Corlis Leak M.D.   On: 01/17/2020 08:04    Scheduled Meds: .  amLODipine  5 mg Oral Daily  . aspirin EC  81 mg Oral QPM  . brimonidine  1 drop Right Eye BID  . carvedilol  6.25 mg Oral BID WC  . cholecalciferol  1,000 Units Oral Daily  . enoxaparin (LOVENOX) injection  40 mg Subcutaneous Q24H  . insulin aspart  0-15 Units Subcutaneous TID WC  . insulin aspart  0-5 Units Subcutaneous QHS  . insulin detemir  10 Units Subcutaneous QHS  . latanoprost  1 drop Right Eye QHS  . multivitamin with minerals  1 tablet Oral Daily  . polyethylene glycol  17 g Oral Daily  . Ensure Max Protein  11 oz Oral TID  . simvastatin  20 mg Oral Daily  . tamsulosin  0.4 mg Oral QPC supper  . cyanocobalamin  2,000 mcg Oral Daily   Continuous Infusions:   LOS: 4 days   Time spent: 25 minutes.  Arnetha Courser, MD [facility]  If 7PM-7AM, please contact night-coverage [email or web address]  01/18/2020, 2:37 PM   This record has been created using Conservation officer, historic buildings. Errors have been sought and corrected,but may not always be located. Such creation errors do not reflect on the standard of care.

## 2020-01-18 NOTE — Progress Notes (Signed)
Physical Therapy Treatment Patient Details Name: Roy [name]. MRN: [id number] DOB: 05-20-1945 Today's Date: 01/18/2020    History of Present Illness Roy [name] is a 74 y.o. male with a known history of diabetes, hypertension presents to the emergency department for evaluation of bilateral lower extremity weakness.  Patient attempted to use the bathroom this morning but was so weak in his lower extremities that he could not stand from the toilet.  He reports severe progressively worsening bilateral lower extremity weakness despite getting home physical therapy.  Patient reports associated low back pain which radiates down the backs of both legs.    PT Comments    Pt voiced frustration over limited mobility and weakness.  Encouragement given.  He is able to sit EOB with mod a  1 and remain sitting x 20 minutes.  Participated in exercises as described below.  He reports continued pain in L knee limiting his ability to attempt standing.  Questions answered regarding rehab and progression of therapy.   Follow Up Recommendations  SNF;Supervision/Assistance - 24 hour     Equipment Recommendations       Recommendations for Other Services       Precautions / Restrictions Precautions Precautions: Fall Required Braces or Orthoses: Other Brace (BLES knee braces) Other Brace: has B knee braces for support with standing/walking Restrictions Weight Bearing Restrictions: No Other Position/Activity Restrictions: B shoulder pain/weakness limting UE assist.    Mobility  Bed Mobility Overal bed mobility: Needs Assistance Bed Mobility: Supine to Sit;Sit to Supine     Supine to sit: Mod assist Sit to supine: Max assist      Transfers                 General transfer comment: did not attempt due to knee pain  Ambulation/Gait             General Gait Details: unable/unsafe   Stairs             Wheelchair Mobility    Modified Rankin (Stroke Patients  Only)       Balance Overall balance assessment: Needs assistance Sitting-balance support: Feet supported Sitting balance-Leahy Scale: Good                                      Cognition Arousal/Alertness: Awake/alert Behavior During Therapy: WFL for tasks assessed/performed Overall Cognitive Status: Within Functional Limits for tasks assessed                                 General Comments: Pt is A and O x 4 and agreeable and cooperative throughout      Exercises Other Exercises Other Exercises: seated LE ex 2 x 10 for LAQ, marches, ab/add    General Comments        Pertinent Vitals/Pain Pain Assessment: Faces Faces Pain Scale: Hurts even more Pain Location: L knee, B shoulders Pain Descriptors / Indicators: Aching;Sore Pain Intervention(s): Limited activity within patient's tolerance;Monitored during session;Repositioned    Home Living                      Prior Function            PT Goals (current goals can now be found in the care plan section) Progress towards PT goals: Progressing toward goals    Frequency  Min 3X/week      PT Plan Current plan remains appropriate    Co-evaluation              AM-PAC PT "6 Clicks" Mobility   Outcome Measure  Help needed turning from your back to your side while in a flat bed without using bedrails?: A Lot Help needed moving from lying on your back to sitting on the side of a flat bed without using bedrails?: A Lot Help needed moving to and from a bed to a chair (including a wheelchair)?: Total Help needed standing up from a chair using your arms (e.g., wheelchair or bedside chair)?: Total Help needed to walk in hospital room?: Total Help needed climbing 3-5 steps with a railing? : Total 6 Click Score: 8    End of Session   Activity Tolerance: Patient tolerated treatment well Patient left: in bed;with call bell/phone within reach;with family/visitor  present Nurse Communication: Mobility status       Time: 0865-7846 PT Time Calculation (min) (ACUTE ONLY): 31 min  Charges:  $Therapeutic Exercise: 8-22 mins $Therapeutic Activity: 8-22 mins                    Danielle Dess, PTA 01/18/20, 12:43 PM

## 2020-01-18 NOTE — Progress Notes (Addendum)
Pt only voiding small amounts at a time. Wife at bedside reports pt feeling abdominal discomfort and unable to void. Bladder scan showed >1386. NP made aware and In and out cath ordered. 1550 ml urine from straight cath.

## 2020-01-18 NOTE — Progress Notes (Signed)
MD paged to inform patient still has not voided and refusing foley. Asked for [place] to attempt in and out cath before inserting foley. Documentation shows the patient had an in and out cath on night shift. Order put in for in and out cath.   Update: patient had a total of 850 ml output from in and out cath. Oncoming nurse notified.

## 2020-01-19 ENCOUNTER — Inpatient Hospital Stay: Payer: Medicare HMO

## 2020-01-19 DIAGNOSIS — R29898 Other symptoms and signs involving the musculoskeletal system: Secondary | ICD-10-CM | POA: Diagnosis not present

## 2020-01-19 DIAGNOSIS — M48062 Spinal stenosis, lumbar region with neurogenic claudication: Principal | ICD-10-CM

## 2020-01-19 LAB — GLUCOSE, CAPILLARY
Glucose-Capillary: 149 mg/dL — ABNORMAL HIGH (ref 70–99)
Glucose-Capillary: 205 mg/dL — ABNORMAL HIGH (ref 70–99)
Glucose-Capillary: 209 mg/dL — ABNORMAL HIGH (ref 70–99)
Glucose-Capillary: 239 mg/dL — ABNORMAL HIGH (ref 70–99)

## 2020-01-19 IMAGING — MR MR CERVICAL SPINE W/O CM
5 series · 37 of 48 positions shown · non-contrast
Comparison: None.

CLINICAL DATA: Myelopathy, acute or progressive. Bilateral lower
extremity weakness. Unable to stand from the toilet.

EXAM:
MRI CERVICAL SPINE WITHOUT CONTRAST
TECHNIQUE: Multiplanar, multisequence MR imaging of the cervical spine was
performed. No intravenous contrast was administered.

[Series 5: T2 · sagittal · 3.0mm · 0.62mm/px · 6 of 15 slices shown (1 of 2)]
[im 1/15]
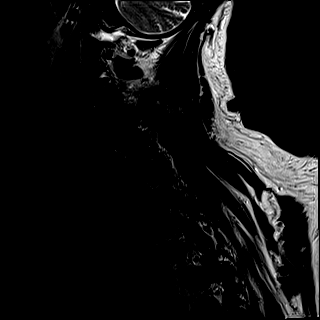
[im 3/15]
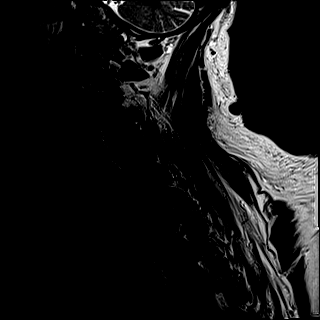
[im 6/15]
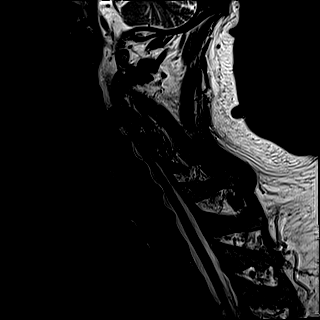
[im 9/15]
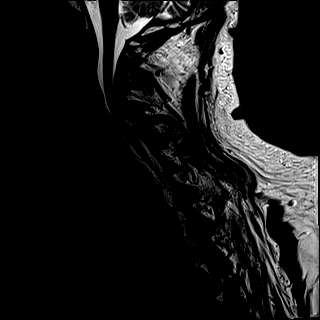
[im 12/15]
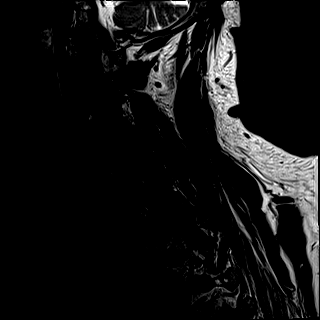
[im 15/15]
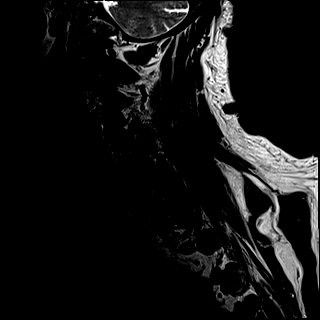

[Series 6: FLAIR · sagittal · 3.0mm · 0.78mm/px · 7 of 15 slices shown]
[im 1/15]
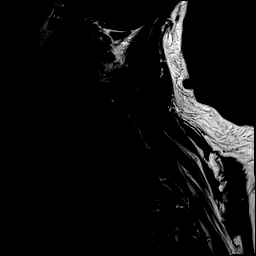
[im 3/15]
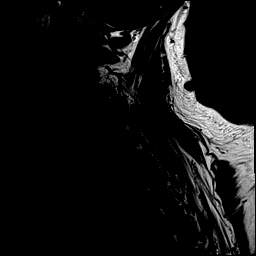
[im 5/15]
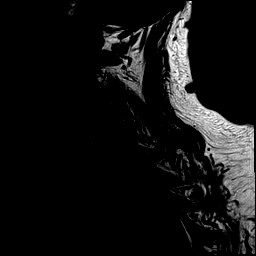
[im 8/15]
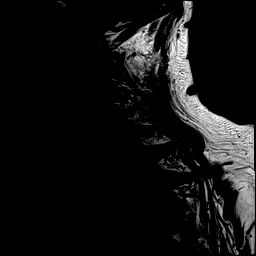
[im 10/15]
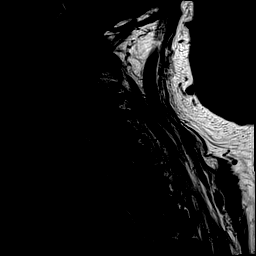
[im 12/15]
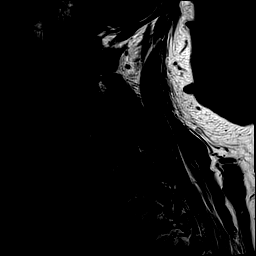
[im 15/15]
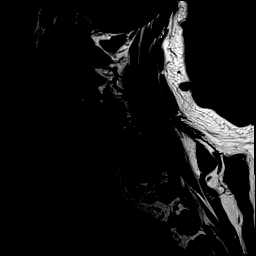

[Series 7: STIR · sagittal · 3.0mm · 0.62mm/px · 7 of 15 slices shown]
[im 1/15]
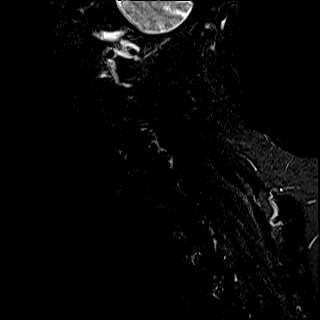
[im 3/15]
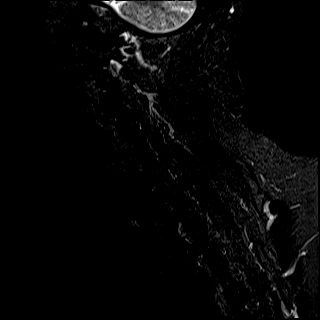
[im 5/15]
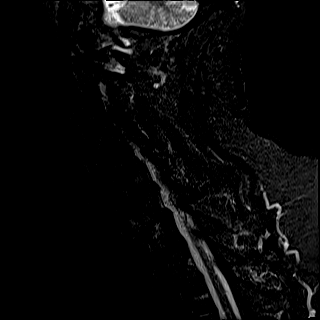
[im 8/15]
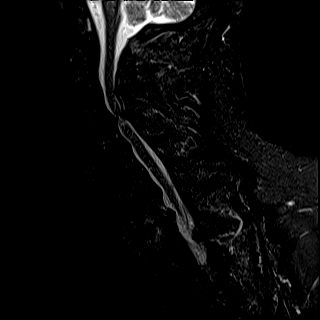
[im 10/15]
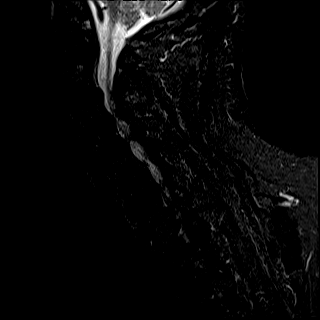
[im 12/15]
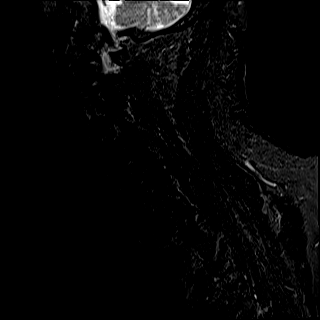
[im 15/15]
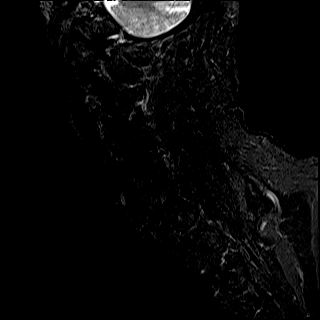

[Series 8: T2 · axial · 3.0mm · 0.70mm/px · z∈[-61,+40]mm · 9 of 31 slices shown (2 of 2)]
[im 1/31]
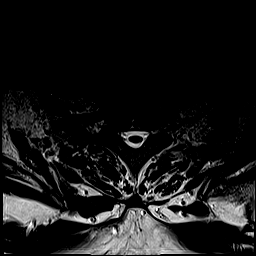
[im 3/31]
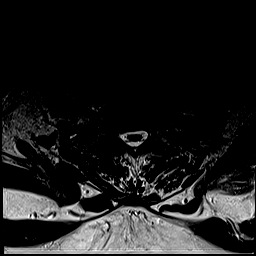
[im 5/31]
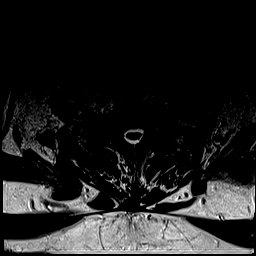
[im 10/31]
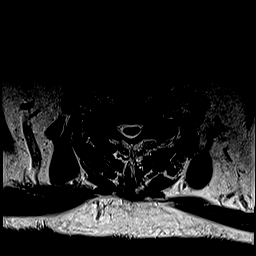
[im 14/31]
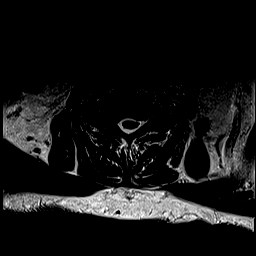
[im 17/31]
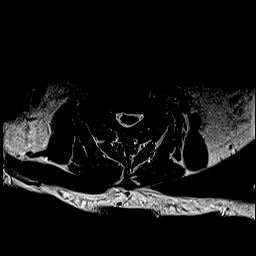
[im 21/31]
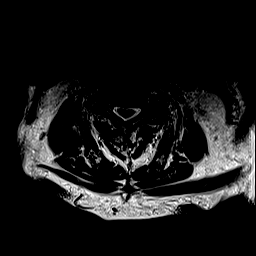
[im 26/31]
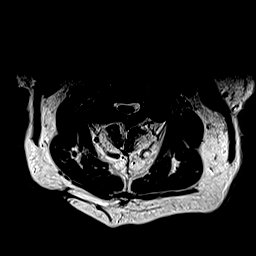
[im 31/31]
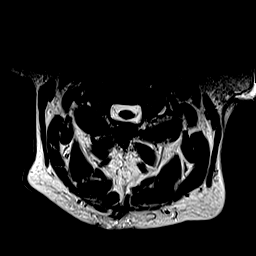

[Series 9: ax mpgr · axial · 3.0mm · 0.35mm/px · z∈[-61,+40]mm · 8 of 31 slices shown]
[im 1/31]
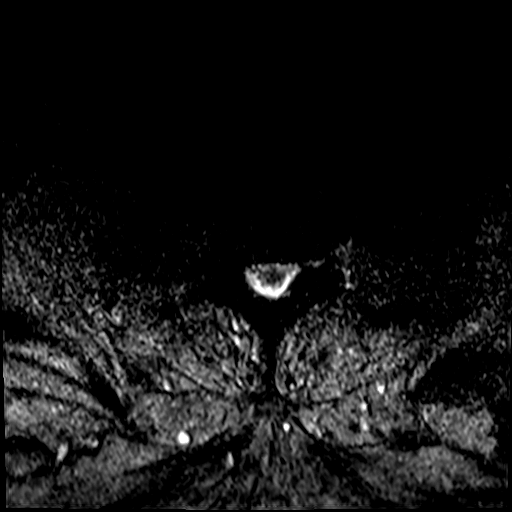
[im 5/31]
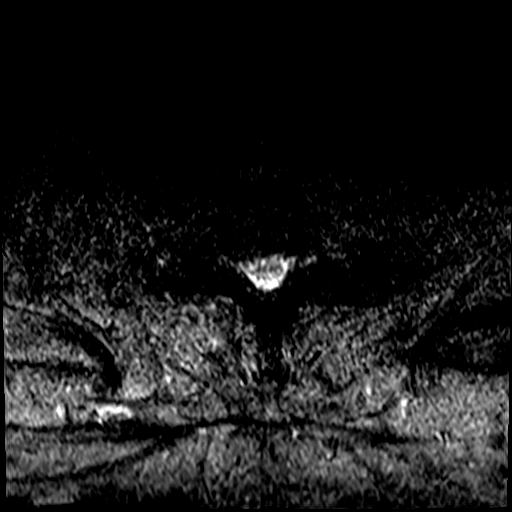
[im 10/31]
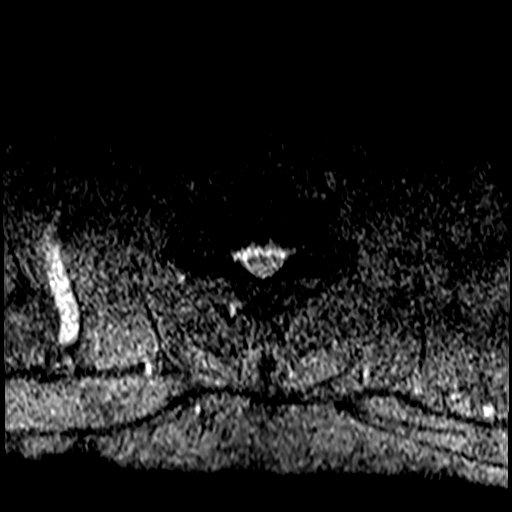
[im 14/31]
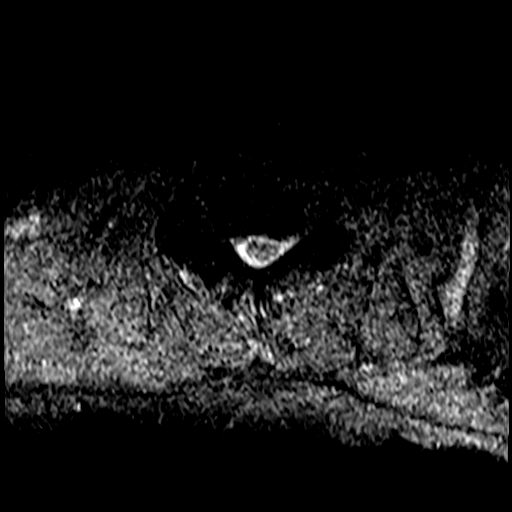
[im 17/31]
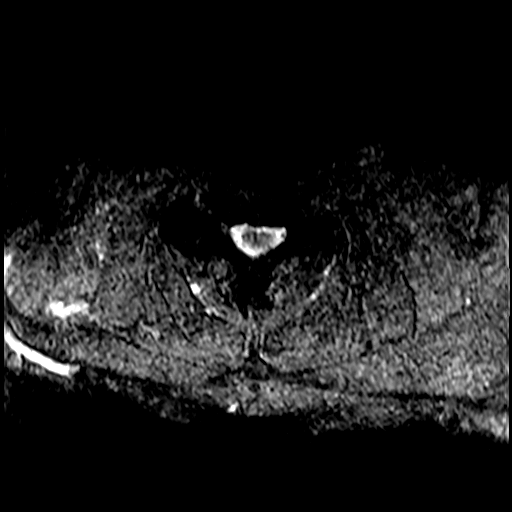
[im 21/31]
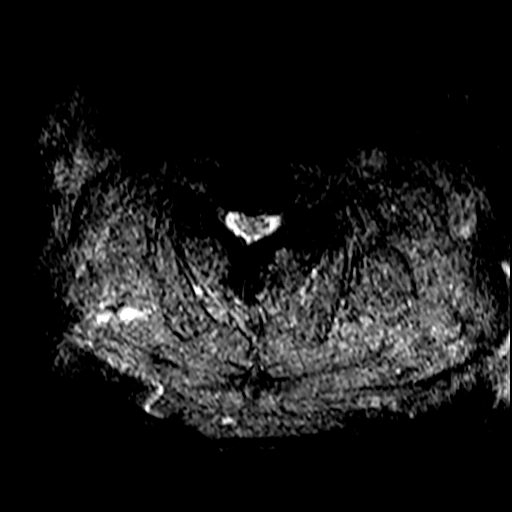
[im 26/31]
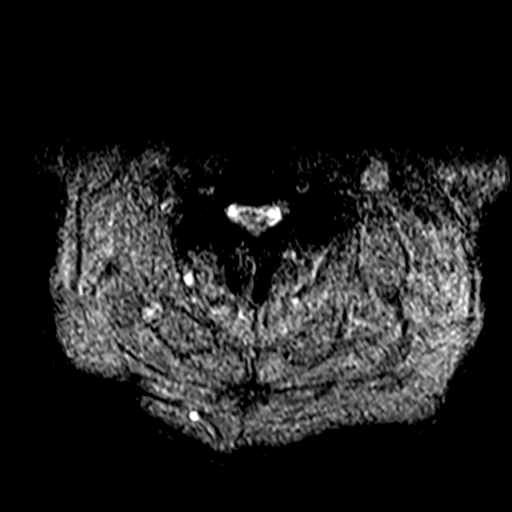
[im 31/31]
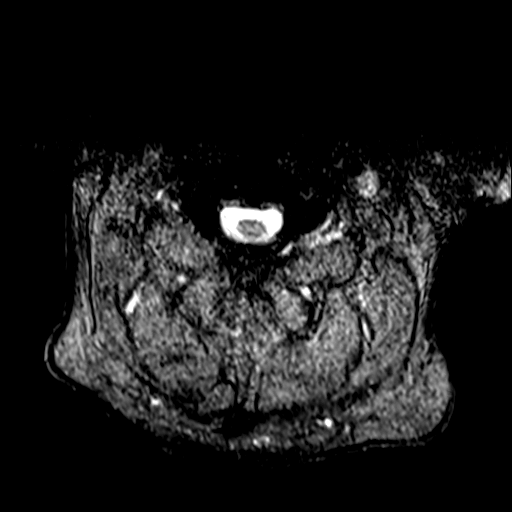

[37 of 48 positions shown; findings below may reference images not displayed]

FINDINGS: Alignment: No significant listhesis is present. Straightening of the
normal lumbar lordosis is noted.

Vertebrae: Edematous endplate marrow changes are present C7-T1.
Chronic changes are present at C5-6 and C6-7. Vertebral body heights
are maintained. Marrow signal is otherwise normal.

Cord: Focal T2 signal hyperintensity is present at C3-4. Cord signal
and morphology is otherwise normal.

Posterior Fossa, vertebral arteries, paraspinal tissues:
Craniocervical junction is normal. Vertebral arteries are patent.

Disc levels:

C2-3: Broad-based disc osteophyte complex present. Uncovertebral
spurring and facet disease contribute to moderate right and mild
left foraminal stenosis. Central canal is patent.

C3-4: A broad-based disc osteophyte complex present. BAMYAN
hypertrophy is worse on left. This results in severe central canal
stenosis. The canal is narrowed to 4 mm. Severe left and moderate
right foraminal stenosis is present.

C4-5: A broad-based disc osteophyte complex partially effaces the
ventral CSF. Uncovertebral and facet hypertrophy result in moderate
right and mild left foraminal stenosis.

C5-6: Uncovertebral and facet disease is worse on the right. Severe
right and moderate left foraminal stenosis is present. The central
canal is patent.

C6-7: Uncovertebral and facet spurring is present bilaterally.
Moderate bilateral foraminal narrowing is present. The central canal
is patent.

C7-T1: Uncovertebral spurring is present bilaterally. Mild facet
hypertrophy is noted bilaterally. Central canal is patent. Moderate
foraminal narrowing is evident bilaterally.
IMPRESSION: 1. Severe central canal stenosis at C3-4. The canal is narrowed to 4
mm. Focal T2 cord signal abnormality is present at this level, most
consistent with edema.
2. Moderate right and mild left foraminal stenosis at C2-3.
3. Moderate right and mild left foraminal stenosis at C4-5.
4. Severe right and moderate left foraminal stenosis at C5-6.
5. Moderate bilateral foraminal narrowing at C6-7 and C7-T1.

## 2020-01-19 MED ORDER — CHLORHEXIDINE GLUCONATE CLOTH 2 % EX PADS
6.0000 | MEDICATED_PAD | Freq: Every day | CUTANEOUS | Status: DC
  Administered 2020-01-19 – 2020-01-25 (×7): 6 via TOPICAL

## 2020-01-19 NOTE — TOC Progression Note (Signed)
Transition of Care (TOC) - Progression Note    Patient Details  Name: Elster Corbello. MRN: [id number] Date of Birth: 12-21-1945  Transition of Care [facility]) CM/SW Contact  Eilleen Kempf, [place] Phone Number: 01/19/2020, 12:50 PM  Clinical Narrative:    CSW received request from attending to check into if this patient could be discharged to SNF today. CSW called Candy at [facility] to find out. Candy stated she had not received any paperwork or referral for this patient. Candy stated she had given CM fax number and needed information to determine if they would be able to accept patient. CSW explained this patient is new to caseload today and assured Candy CSW would send required info via fax immediately. CSW faxed all documents and called Candy back to make sure she did receive them. CSW ask if Auth had been started, Candy stated no because we can't request auth without documentation. Candy stated it will be Monday before she is able to start the auth request, but did confirm they will offer patient a bed. CSW will continue to follow patient as needed.   Expected Discharge Plan: Skilled Nursing Facility Barriers to Discharge: Continued Medical Work up  Expected Discharge Plan and Services Expected Discharge Plan: Skilled Nursing Facility In-house Referral: Clinical Social Work Discharge Planning Services: NA Post Acute Care Choice: Skilled Nursing Facility Living arrangements for the past 2 months: Single Family Home                 DME Arranged: N/A DME Agency: NA       HH Arranged: NA HH Agency: NA         Social Determinants of Health (SDOH) Interventions    Readmission Risk Interventions No flowsheet data found.

## 2020-01-19 NOTE — Progress Notes (Signed)
PROGRESS NOTE    Roy [name].  WPY:[id number] DOB: 1945/04/21 DOA: 01/14/2020 PCP: Rigoberto Noel, MD   Brief Narrative: Taken from H&P [name] a 74 y.o.mw h/o DM, HTN,  presents to ED forevaluation of bilateral lower extremity weakness. Patient attempted to use the bathroom this morning but was so weak in his lower extremities that he could not stand from the toilet. He reports severe progressively worsening bilateral lower extremity weakness despite getting home physical therapy. Patient reports associated low back pain which radiates down the backs of both legs.  Per wife patient has progressively worsening lower extremity weakness and overall declined in his have for the past 1 year.  Up till few weeks ago he was able to walk around the house with walker which is becoming difficult now.  For the past couple of weeks he is spending most of the time in chair and having frequent falls. Patient has history of spinal stenosis and diabetic neuropathy involving both lower extremities. Neurosurgery was also consulted.  Subjective: Pt reported no back pain today.  Frustrated that he was stuck in bed.  Ate well.    Later was able to void.   Assessment & Plan:   Active Problems:   Lower extremity weakness   Weakness   Spinal stenosis of lumbar region   History of progressive weakness   Claudication (HCC)  Bilateral lower extremity weakness with gait instability.  Patient is unable to walk due to worsening lower extremity weakness.  Multiple etiologies can be contributory which include severe spinal stenosis, diabetic neuropathy and a possible vasculopathy.  Neurosurgery was consulted and they recommend doing a thoracic MRI and ABI-waiting their comment on thoracic MRI which also shows stenosis and some disc bulging.  ABI did not show any vascular disease. Good strength on dorsiflexion and plantarflexion on bilateral feet, poor effort to lift the leg up stating that he  cannot do it. B12 came back within normal limit, vitamin D low. PLAN: --MRI cervical spine, per neurosurg --TOC working on SNF placement -Continue with pain management. -continue vitamin D supplement. -Continue vitamin B12 supplement -Neurosurgery wants to follow-up as an outpatient-not recommending any procedure during current hospitalization.  Urinary retention.  Patient with new urinary retention and inability to void.  Not on any BPH medication before. -Encouraged voiding by sitting on the commode instead of laying down.   PLAN: --If unable to void and bladder scan shows more than 500 cc, then place Foley for decompression.  --continue Flomax (new)  Elevated troponin.  Mildly elevated troponin with a flat curve.  No chest pain. Most likely secondary to demand.  Echocardiogram obtained with normal EF, grade 1 diastolic dysfunction and no regional wall motion abnormalities.  Type 2 diabetes mellitus.  CBG mildly elevated. Patient was on Metformin and Levemir at home.  A1c in June 2021 was 6.5 Repeat A1c yesterday was 5.8. PLAN: --continue Levemir 12u nightly --SSI  Hypertension.  Blood pressure within goal. -cont home Coreg and amlodipine  Hyperlipidemia. -Continue home dose of Zocor  History of glaucoma. -Continue home eye drops   Objective: Vitals:   01/18/20 2039 01/19/20 0405 01/19/20 0736 01/19/20 1133  BP: 126/72 126/73 119/78 132/78  Pulse: 67 68 (!) 44 66  Resp: 18 18 18 18   Temp: 98.5 F (36.9 C) 98.4 F (36.9 C) 98.3 F (36.8 C) 98.2 F (36.8 C)  TempSrc: Oral Oral Oral Oral  SpO2: 98% 96% 96% 96%  Weight:  117.3 kg    Height:  Intake/Output Summary (Last 24 hours) at 01/19/2020 1330 Last data filed at 01/19/2020 1134 Gross per 24 hour  Intake 240 ml  Output 1255 ml  Net -1015 ml   Filed Weights   01/17/20 0404 01/18/20 0413 01/19/20 0405  Weight: 116.9 kg 116.8 kg 117.3 kg    Examination:  Constitutional: NAD, AAOx3 HEENT:  conjunctivae and lids normal, EOMI CV: No cyanosis.   RESP: normal respiratory effort, on RA Extremities: No effusions, edema in BLE SKIN: warm, dry and intact Neuro: II - XII grossly intact.   Psych: Normal mood and affect.  Appropriate judgement and reason   DVT prophylaxis: Lovenox Code Status: Full Family Communication: son was updated at bedside today Disposition Plan:  Status is: Inpatient  Remains inpatient appropriate because:Inpatient level of care appropriate due to severity of illness   Dispo: The patient is from: Home              Anticipated d/c is to: SNF              Anticipated d/c date is: whenever bed available              Patient currently is medically stable for discharge.  TOC is working on bed search.   Consultants:   Neurosurgery  Procedures:  Antimicrobials:   Data Reviewed: I have personally reviewed following labs and imaging studies  CBC: Recent Labs  Lab 01/14/20 1143 01/15/20 0348 01/18/20 0447  WBC 9.9 8.9 10.5  HGB 15.6 15.5 13.8  HCT 47.1 46.0 40.8  MCV 95.2 95.0 95.6  PLT 197 181 160   Basic Metabolic Panel: Recent Labs  Lab 01/14/20 1143 01/14/20 2154 01/15/20 0348 01/18/20 0448  NA 141  --  142 138  K 3.9  --  3.6 3.4*  CL 104  --  104 103  CO2 27  --  27 25  GLUCOSE 133*  --  118* 154*  BUN 20  --  19 38*  CREATININE 1.07  --  0.93 1.09  CALCIUM 9.9  --  9.7 9.3  MG  --  1.7  --   --   PHOS  --  2.7  --   --    GFR: Estimated Creatinine Clearance: 83.3 mL/min (by C-G formula based on SCr of 1.09 mg/dL). Liver Function Tests: No results for input(s): AST, ALT, ALKPHOS, BILITOT, PROT, ALBUMIN in the last 168 hours. No results for input(s): LIPASE, AMYLASE in the last 168 hours. No results for input(s): AMMONIA in the last 168 hours. Coagulation Profile: No results for input(s): INR, PROTIME in the last 168 hours. Cardiac Enzymes: No results for input(s): CKTOTAL, CKMB, CKMBINDEX, TROPONINI in the last 168  hours. BNP (last 3 results) No results for input(s): PROBNP in the last 8760 hours. HbA1C: No results for input(s): HGBA1C in the last 72 hours. CBG: Recent Labs  Lab 01/18/20 1650 01/18/20 2040 01/18/20 2205 01/19/20 0736 01/19/20 1132  GLUCAP 203* 232* 208* 149* 205*   Lipid Profile: No results for input(s): CHOL, HDL, LDLCALC, TRIG, CHOLHDL, LDLDIRECT in the last 72 hours. Thyroid Function Tests: No results for input(s): TSH, T4TOTAL, FREET4, T3FREE, THYROIDAB in the last 72 hours. Anemia Panel: No results for input(s): VITAMINB12, FOLATE, FERRITIN, TIBC, IRON, RETICCTPCT in the last 72 hours. Sepsis Labs: No results for input(s): PROCALCITON, LATICACIDVEN in the last 168 hours.  Recent Results (from the past 240 hour(s))  Respiratory Panel by RT PCR (Flu A&B, Covid) - Nasopharyngeal Swab  Status: None   Collection Time: 01/14/20  8:52 PM   Specimen: Nasopharyngeal Swab  Result Value Ref Range Status   SARS Coronavirus 2 by RT PCR NEGATIVE NEGATIVE Final    Comment: (NOTE) SARS-CoV-2 target nucleic acids are NOT DETECTED.  The SARS-CoV-2 RNA is generally detectable in upper respiratoy specimens during the acute phase of infection. The lowest concentration of SARS-CoV-2 viral copies this assay can detect is 131 copies/mL. A negative result does not preclude SARS-Cov-2 infection and should not be used as the sole basis for treatment or other patient management decisions. A negative result may occur with  improper specimen collection/handling, submission of specimen other than nasopharyngeal swab, presence of viral mutation(s) within the areas targeted by this assay, and inadequate number of viral copies (<131 copies/mL). A negative result must be combined with clinical observations, patient history, and epidemiological information. The expected result is Negative.  Fact Sheet for Patients:  https://www.moore.com/  Fact Sheet for Healthcare  Providers:  https://www.young.biz/  This test is no t yet approved or cleared by the [place] FDA and  has been authorized for detection and/or diagnosis of SARS-CoV-2 by FDA under an Emergency Use Authorization (EUA). This EUA will remain  in effect (meaning this test can be used) for the duration of the COVID-19 declaration under Section 564(b)(1) of the Act, 21 U.S.C. section 360bbb-3(b)(1), unless the authorization is terminated or revoked sooner.     Influenza A by PCR NEGATIVE NEGATIVE Final   Influenza B by PCR NEGATIVE NEGATIVE Final    Comment: (NOTE) The Xpert Xpress SARS-CoV-2/FLU/RSV assay is intended as an aid in  the diagnosis of influenza from Nasopharyngeal swab specimens and  should not be used as a sole basis for treatment. Nasal washings and  aspirates are unacceptable for Xpert Xpress SARS-CoV-2/FLU/RSV  testing.  Fact Sheet for Patients: https://www.moore.com/  Fact Sheet for Healthcare Providers: https://www.young.biz/  This test is not yet approved or cleared by the [place] FDA and  has been authorized for detection and/or diagnosis of SARS-CoV-2 by  FDA under an Emergency Use Authorization (EUA). This EUA will remain  in effect (meaning this test can be used) for the duration of the  Covid-19 declaration under Section 564(b)(1) of the Act, 21  U.S.C. section 360bbb-3(b)(1), unless the authorization is  terminated or revoked. Performed at [facility], [address] [place], [place] [address]      Radiology Studies: No results found.  Scheduled Meds:  amLODipine  5 mg Oral Daily   aspirin EC  81 mg Oral QPM   brimonidine  1 drop Right Eye BID   carvedilol  6.25 mg Oral BID WC   cholecalciferol  1,000 Units Oral Daily   enoxaparin (LOVENOX) injection  40 mg Subcutaneous Q24H   insulin aspart  0-15 Units Subcutaneous TID WC   insulin aspart  0-5 Units  Subcutaneous QHS   insulin detemir  12 Units Subcutaneous QHS   latanoprost  1 drop Right Eye QHS   multivitamin with minerals  1 tablet Oral Daily   polyethylene glycol  17 g Oral Daily   Ensure Max Protein  11 oz Oral TID   simvastatin  20 mg Oral Daily   tamsulosin  0.4 mg Oral QPC supper   cyanocobalamin  2,000 mcg Oral Daily   Continuous Infusions:   LOS: 5 days    Darlin Priestly, MD [facility]  If 7PM-7AM, please contact night-coverage [email or web address]  01/19/2020, 1:30 PM

## 2020-01-19 NOTE — Plan of Care (Signed)

## 2020-01-19 NOTE — Progress Notes (Signed)
Bladder scan showed in bladder. Pt states he does not want a foley and would like to see if he can void by the morning. Pt educated on risks and benefits of a foley due to retention of urine.

## 2020-01-20 ENCOUNTER — Inpatient Hospital Stay: Payer: Medicare HMO

## 2020-01-20 DIAGNOSIS — M4802 Spinal stenosis, cervical region: Secondary | ICD-10-CM | POA: Diagnosis not present

## 2020-01-20 LAB — GLUCOSE, CAPILLARY
Glucose-Capillary: 165 mg/dL — ABNORMAL HIGH (ref 70–99)
Glucose-Capillary: 185 mg/dL — ABNORMAL HIGH (ref 70–99)
Glucose-Capillary: 231 mg/dL — ABNORMAL HIGH (ref 70–99)
Glucose-Capillary: 175 mg/dL — ABNORMAL HIGH (ref 70–99)

## 2020-01-20 LAB — CBC
HCT: 40.4 % (ref 39.0–52.0)
Hemoglobin: 13.4 g/dL (ref 13.0–17.0)
MCH: 31.8 pg (ref 26.0–34.0)
MCHC: 33.2 g/dL (ref 30.0–36.0)
MCV: 96 fL (ref 80.0–100.0)
Platelets: 208 10*3/uL (ref 150–400)
RBC: 4.21 MIL/uL — ABNORMAL LOW (ref 4.22–5.81)
RDW: 12.4 % (ref 11.5–15.5)
WBC: 8.9 10*3/uL (ref 4.0–10.5)
nRBC: 0 % (ref 0.0–0.2)

## 2020-01-20 LAB — BASIC METABOLIC PANEL
Anion gap: 11 (ref 5–15)
BUN: 38 mg/dL — ABNORMAL HIGH (ref 8–23)
CO2: 26 mmol/L (ref 22–32)
Calcium: 9.4 mg/dL (ref 8.9–10.3)
Chloride: 102 mmol/L (ref 98–111)
Creatinine, Ser: 1.01 mg/dL (ref 0.61–1.24)
GFR, Estimated: >60 mL/min (ref >60)
Glucose, Bld: 188 mg/dL — ABNORMAL HIGH (ref 70–99)
Potassium: 3.6 mmol/L (ref 3.5–5.1)
Sodium: 139 mmol/L (ref 135–145)

## 2020-01-20 LAB — MAGNESIUM: Magnesium: 2 mg/dL (ref 1.7–2.4)

## 2020-01-20 IMAGING — CR DG ABDOMEN 1V
1 series · 3 of 3 positions shown · non-contrast
Comparison: None.

CLINICAL DATA: Right lower quadrant pain

EXAM:
ABDOMEN - 1 VIEW

[Series 1: dg abd 1 view · 0.14mm/px · 3 of 3 slices shown]
[im 1/3]
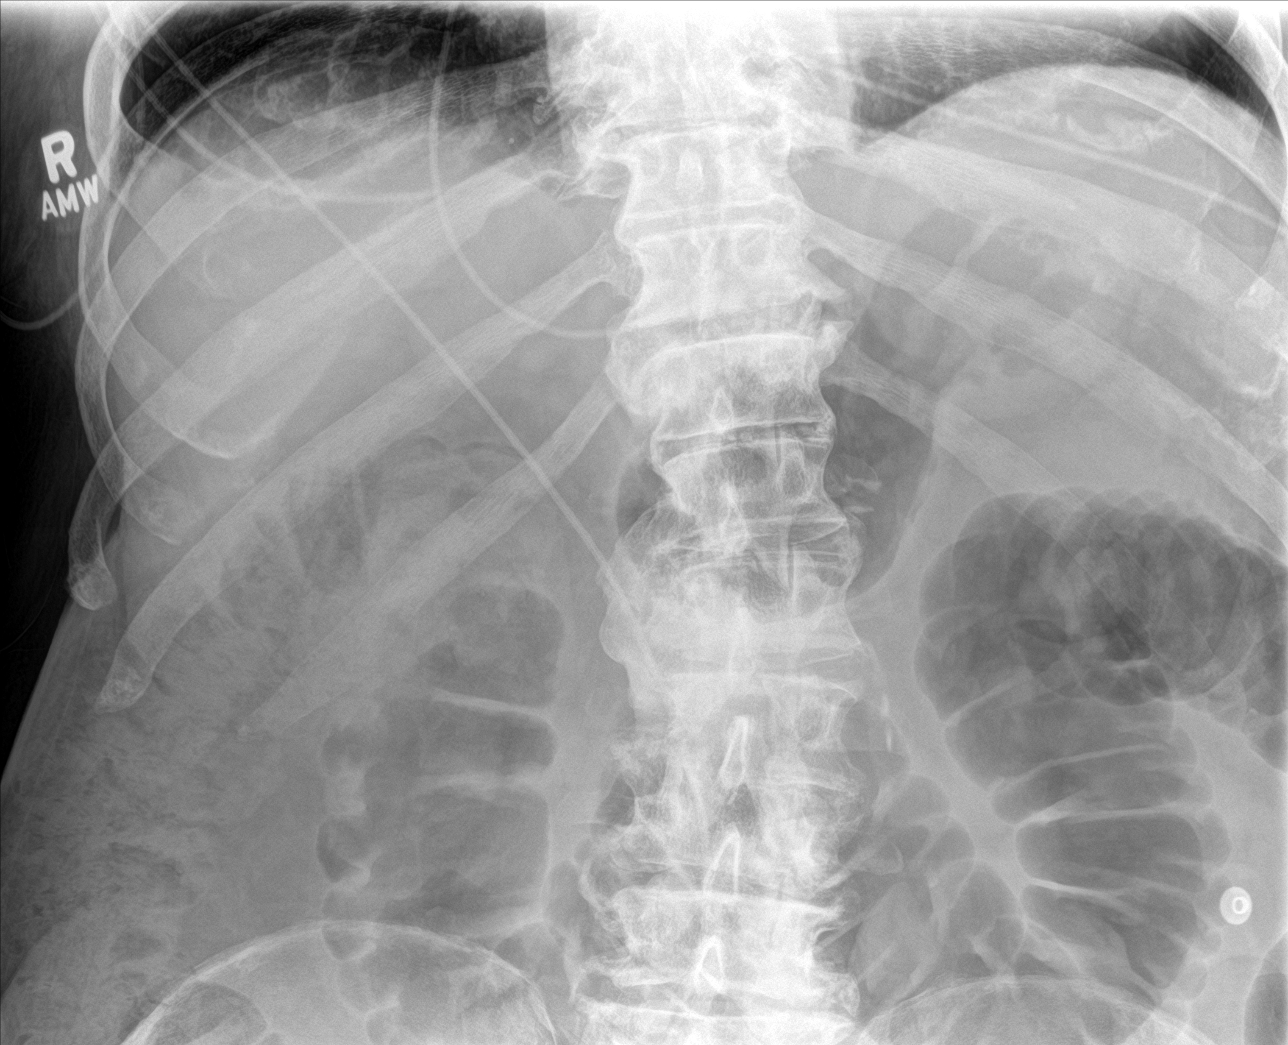
[im 2/3]
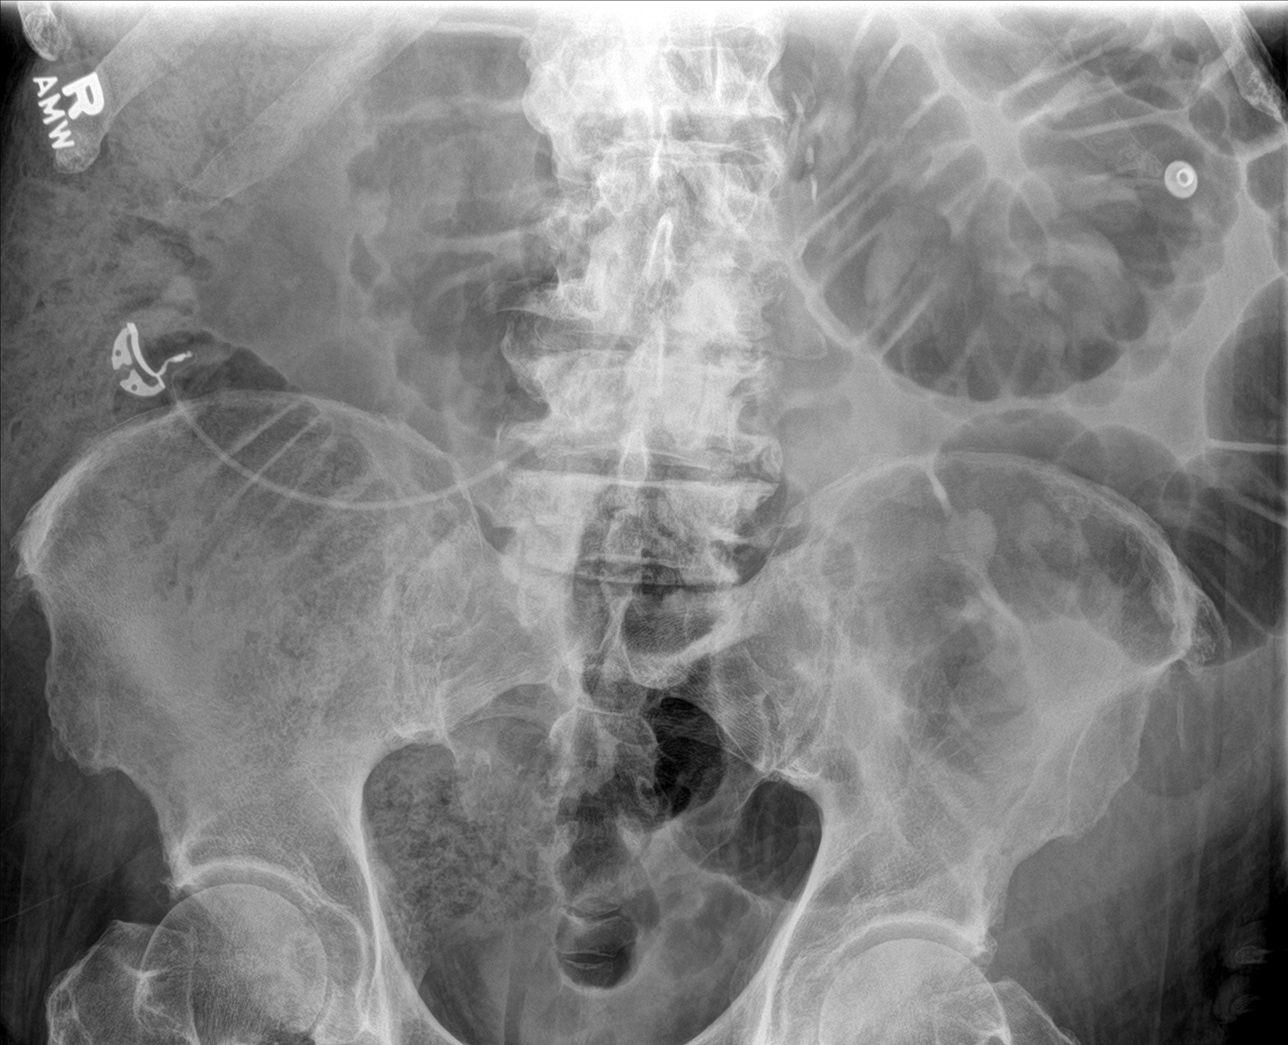
[im 3/3]
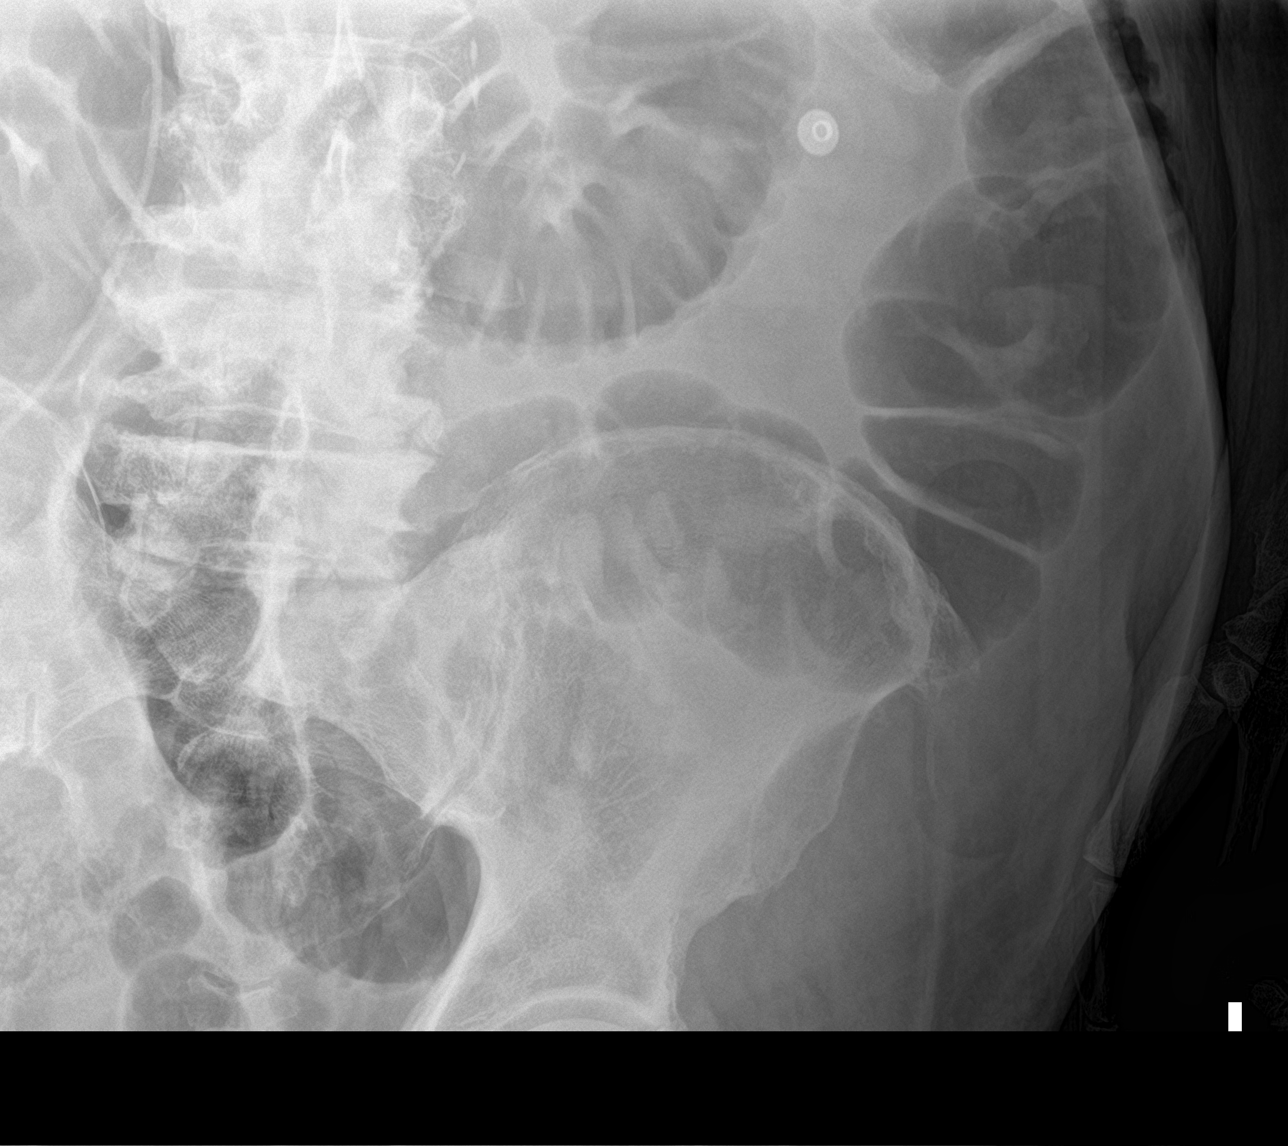

[3 of 3 positions shown; findings below may reference images not displayed]

FINDINGS: There is nonspecific gaseous distention throughout the majority of
the colon. There is a large amount of stool in the right hemicolon.
There is no pneumatosis or free air. No evidence for small bowel
obstruction. Advanced multilevel degenerative changes are noted
throughout the lumbar spine. There are degenerative changes of both
hips. No convincing radiopaque kidney stone.
IMPRESSION: Nonspecific gaseous distention throughout the majority of the colon.
Large amount of stool in the right hemicolon. No evidence for small
bowel obstruction.

## 2020-01-20 MED ORDER — POLYETHYLENE GLYCOL 3350 17 G PO PACK
34.0000 g | PACK | ORAL | Status: DC
  Administered 2020-01-20: 34 g via ORAL
  Filled 2020-01-20 (×3): qty 2

## 2020-01-20 MED ORDER — POLYETHYLENE GLYCOL 3350 17 G PO PACK
34.0000 g | PACK | ORAL | Status: AC
  Administered 2020-01-20: 34 g via ORAL
  Filled 2020-01-20: qty 2

## 2020-01-20 NOTE — Progress Notes (Signed)
PROGRESS NOTE    [name]  NTI:[id number] DOB: [birth year] DOA: 01/14/2020 PCP: Rigoberto Noel, MD   Brief Narrative: Taken from H&P [name] a 74 y.o.mw h/o DM, HTN,  presents to ED forevaluation of bilateral lower extremity weakness. Patient attempted to use the bathroom this morning but was so weak in his lower extremities that he could not stand from the toilet. He reports severe progressively worsening bilateral lower extremity weakness despite getting home physical therapy. Patient reports associated low back pain which radiates down the backs of both legs.  Per wife patient has progressively worsening lower extremity weakness and overall declined in his have for the past 1 year.  Up till few weeks ago he was able to walk around the house with walker which is becoming difficult now.  For the past couple of weeks he is spending most of the time in chair and having frequent falls. Patient has history of spinal stenosis and diabetic neuropathy involving both lower extremities. Neurosurgery was also consulted.  Subjective: Foley was inserted yesterday for retention.  Today, pt reported RLQ pain and felt constipated.  Has been eating very well but having only watery stool with straining.  Pt and family have decided to proceed with surgery.   Assessment & Plan:   Active Problems:   Lower extremity weakness   Weakness   Spinal stenosis of lumbar region   History of progressive weakness   Claudication (HCC)  Bilateral lower extremity weakness with gait instability.   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  Multiple etiologies can be contributory which include severe spinal stenosis, diabetic neuropathy and a possible vasculopathy.  Neurosurgery was consulted and they recommend doing a thoracic MRI and ABI-waiting their comment on thoracic MRI which also shows stenosis and some disc bulging.   ABI did not show any vascular disease. Good strength on dorsiflexion and plantarflexion on bilateral feet, poor effort to lift the leg up stating that he cannot do it. B12 came back within normal limit, vitamin D low. 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 -continue vitamin D supplement. -Continue vitamin B12 supplement -Neurosurgery offered C3/4 ACDF on Monday.  Per nurse, pt and family had agreed to it.  Pt made NPO MN.  Dr. Lenise Herald.    Urinary retention.   Patient with new urinary retention and inability to void.  Not on any BPH medication before. -Multiple I/O performed for retention before Foley inserted yesterday.  PLAN: --cont Foley for bladder decompression.  --Consider voiding trial in 3 days --continue Flomax (new)  Elevated troponin.  Mildly elevated troponin with a flat curve.  No chest pain. Most likely secondary to demand.  Echocardiogram obtained with normal EF, grade 1 diastolic dysfunction and no regional wall motion abnormalities.  Type 2 diabetes mellitus.  CBG mildly elevated. Patient was on Metformin and Levemir at home.  A1c in June 2021 was 6.5 Repeat A1c yesterday was 5.8. PLAN: --continue Levemir 12u nightly --SSI  Hypertension.   Blood pressure within goal. --continue home coreg and amlodipine  Hyperlipidemia. -Continue home dose of Zocor  History of glaucoma. -Continue home eye drops  RLQ 2/2 stool impaction --Pt reported eating very well but having only watery stool with straining.  Pain started today. PLAN: --KUB today, per my view, had a column full of stool on the right, corresponding to pt's location of pain.  Later confirmed  by radiology read. --ordered Miralax 34g q2h until BM achieved.     Objective: Vitals:   01/19/20 2001 01/20/20 0427 01/20/20 0830 01/20/20 1121  BP: 138/73 124/74 130/78 124/89  Pulse: 72 67 70 60  Resp: 19 18 18 18   Temp: 98.7 F (37.1 C)  98.2 F (36.8 C) 98.1 F (36.7 C) 98.2 F (36.8 C)  TempSrc: Oral Oral Oral Oral  SpO2: 97% 97% 96% 96%  Weight:      Height:        Intake/Output Summary (Last 24 hours) at 01/20/2020 1620 Last data filed at 01/20/2020 1320 Gross per 24 hour  Intake 960 ml  Output 1001 ml  Net -41 ml   Filed Weights   01/17/20 0404 01/18/20 0413 01/19/20 0405  Weight: 116.9 kg 116.8 kg 117.3 kg    Examination:  Constitutional: NAD, AAOx3 HEENT: conjunctivae and lids normal, EOMI CV: No cyanosis.   RESP: normal respiratory effort  GI: ND, tenderness to palpation over RLQ, and something hard felt with deep palpation Extremities: No effusions, edema in BLE SKIN: warm, dry and intact Neuro: II - XII grossly intact.  Sensation intact Psych: Normal mood and affect.  Appropriate judgement and reason   DVT prophylaxis: Lovenox Code Status: Full Family Communication: son was updated at bedside today Disposition Plan:  Status is: Inpatient  Remains inpatient appropriate because:Inpatient level of care appropriate due to severity of illness   Dispo: The patient is from: Home              Anticipated d/c is to: SNF              Anticipated d/c date is: >3 days              Patient currently is not medically stable for discharge.  Plan to undergo C3/4 ACDF with neurosurgery.    Consultants:   Neurosurgery  Procedures:  Antimicrobials:   Data Reviewed: I have personally reviewed following labs and imaging studies  CBC: Recent Labs  Lab 01/14/20 1143 01/15/20 0348 01/18/20 0447 01/20/20 0508  WBC 9.9 8.9 10.5 8.9  HGB 15.6 15.5 13.8 13.4  HCT 47.1 46.0 40.8 40.4  MCV 95.2 95.0 95.6 96.0  PLT 197 181 160 208   Basic Metabolic Panel: Recent Labs  Lab 01/14/20 1143 01/14/20 2154 01/15/20 0348 01/18/20 0448 01/20/20 0508  NA 141  --  142 138 139  K 3.9  --  3.6 3.4* 3.6  CL 104  --  104 103 102  CO2 27  --  27 25 26   GLUCOSE 133*  --  118* 154* 188*  BUN 20  --  19  38* 38*  CREATININE 1.07  --  0.93 1.09 1.01  CALCIUM 9.9  --  9.7 9.3 9.4  MG  --  1.7  --   --  2.0  PHOS  --  2.7  --   --   --    GFR: Estimated Creatinine Clearance: 89.9 mL/min (by C-G formula based on SCr of 1.01 mg/dL). Liver Function Tests: No results for input(s): AST, ALT, ALKPHOS, BILITOT, PROT, ALBUMIN in the last 168 hours. No results for input(s): LIPASE, AMYLASE in the last 168 hours. No results for input(s): AMMONIA in the last 168 hours. Coagulation Profile: No results for input(s): INR, PROTIME in the last 168 hours. Cardiac Enzymes: No results for input(s): CKTOTAL, CKMB, CKMBINDEX, TROPONINI in the last 168 hours. BNP (last 3 results) No results for input(s): PROBNP in  the last 8760 hours. HbA1C: No results for input(s): HGBA1C in the last 72 hours. CBG: Recent Labs  Lab 01/19/20 1132 01/19/20 1629 01/19/20 2008 01/20/20 0830 01/20/20 1122  GLUCAP 205* 209* 239* 165* 185*   Lipid Profile: No results for input(s): CHOL, HDL, LDLCALC, TRIG, CHOLHDL, LDLDIRECT in the last 72 hours. Thyroid Function Tests: No results for input(s): TSH, T4TOTAL, FREET4, T3FREE, THYROIDAB in the last 72 hours. Anemia Panel: No results for input(s): VITAMINB12, FOLATE, FERRITIN, TIBC, IRON, RETICCTPCT in the last 72 hours. Sepsis Labs: No results for input(s): PROCALCITON, LATICACIDVEN in the last 168 hours.  Recent Results (from the past 240 hour(s))  Respiratory Panel by RT PCR (Flu A&B, Covid) - Nasopharyngeal Swab     Status: None   Collection Time: 01/14/20  8:52 PM   Specimen: Nasopharyngeal Swab  Result Value Ref Range Status   SARS Coronavirus 2 by RT PCR NEGATIVE NEGATIVE Final    Comment: (NOTE) SARS-CoV-2 target nucleic acids are NOT DETECTED.  The SARS-CoV-2 RNA is generally detectable in upper respiratoy specimens during the acute phase of infection. The lowest concentration of SARS-CoV-2 viral copies this assay can detect is 131 copies/mL. A negative  result does not preclude SARS-Cov-2 infection and should not be used as the sole basis for treatment or other patient management decisions. A negative result may occur with  improper specimen collection/handling, submission of specimen other than nasopharyngeal swab, presence of viral mutation(s) within the areas targeted by this assay, and inadequate number of viral copies (<131 copies/mL). A negative result must be combined with clinical observations, patient history, and epidemiological information. The expected result is Negative.  Fact Sheet for Patients:  https://www.moore.com/  Fact Sheet for Healthcare Providers:  https://www.young.biz/  This test is no t yet approved or cleared by the [place] FDA and  has been authorized for detection and/or diagnosis of SARS-CoV-2 by FDA under an Emergency Use Authorization (EUA). This EUA will remain  in effect (meaning this test can be used) for the duration of the COVID-19 declaration under Section 564(b)(1) of the Act, 21 U.S.C. section 360bbb-3(b)(1), unless the authorization is terminated or revoked sooner.     Influenza A by PCR NEGATIVE NEGATIVE Final   Influenza B by PCR NEGATIVE NEGATIVE Final    Comment: (NOTE) The Xpert Xpress SARS-CoV-2/FLU/RSV assay is intended as an aid in  the diagnosis of influenza from Nasopharyngeal swab specimens and  should not be used as a sole basis for treatment. Nasal washings and  aspirates are unacceptable for Xpert Xpress SARS-CoV-2/FLU/RSV  testing.  Fact Sheet for Patients: https://www.moore.com/  Fact Sheet for Healthcare Providers: https://www.young.biz/  This test is not yet approved or cleared by the [place] FDA and  has been authorized for detection and/or diagnosis of SARS-CoV-2 by  FDA under an Emergency Use Authorization (EUA). This EUA will remain  in effect (meaning this test can be used)  for the duration of the  Covid-19 declaration under Section 564(b)(1) of the Act, 21  U.S.C. section 360bbb-3(b)(1), unless the authorization is  terminated or revoked. Performed at [facility], [address] [place], [place] [address]      Radiology Studies: MR CERVICAL SPINE WO CONTRAST  Result Date: 01/19/2020 CLINICAL DATA:  Myelopathy, acute or progressive. Bilateral lower extremity weakness. Unable to stand from the toilet. EXAM: MRI CERVICAL SPINE WITHOUT CONTRAST TECHNIQUE: Multiplanar, multisequence MR imaging of the cervical spine was performed. No intravenous contrast was administered. COMPARISON:  None. FINDINGS: Alignment: No  significant listhesis is present. Straightening of the normal lumbar lordosis is noted. Vertebrae: Edematous endplate marrow changes are present C7-T1. Chronic changes are present at C5-6 and C6-7. Vertebral body heights are maintained. Marrow signal is otherwise normal. Cord: Focal T2 signal hyperintensity is present at C3-4. Cord signal and morphology is otherwise normal. Posterior Fossa, vertebral arteries, paraspinal tissues: Craniocervical junction is normal. Vertebral arteries are patent. Disc levels: C2-3: Broad-based disc osteophyte complex present. Uncovertebral spurring and facet disease contribute to moderate right and mild left foraminal stenosis. Central canal is patent. C3-4: A broad-based disc osteophyte complex present. Vance facet hypertrophy is worse on left. This results in severe central canal stenosis. The canal is narrowed to 4 mm. Severe left and moderate right foraminal stenosis is present. C4-5: A broad-based disc osteophyte complex partially effaces the ventral CSF. Uncovertebral and facet hypertrophy result in moderate right and mild left foraminal stenosis. C5-6: Uncovertebral and facet disease is worse on the right. Severe right and moderate left foraminal stenosis is present. The central canal is patent. C6-7: Uncovertebral and  facet spurring is present bilaterally. Moderate bilateral foraminal narrowing is present. The central canal is patent. C7-T1: Uncovertebral spurring is present bilaterally. Mild facet hypertrophy is noted bilaterally. Central canal is patent. Moderate foraminal narrowing is evident bilaterally. IMPRESSION: 1. 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 The canal is narrowed to 4 mm. Focal T2 cord signal abnormality is present at this level, most consistent with edema. 2. Moderate right and mild left foraminal stenosis at C2-3. 3. Moderate right and mild left foraminal stenosis at C4-5. 4. Severe right and moderate left foraminal stenosis at C5-6. 5. Moderate bilateral foraminal narrowing at C6-7 and C7-T1. Electronically Signed   By: Marin Roberts M.D.   On: 01/19/2020 19:50    Scheduled Meds: . amLODipine  5 mg Oral Daily  . aspirin EC  81 mg Oral QPM  . brimonidine  1 drop Right Eye BID  . carvedilol  6.25 mg Oral BID WC  . Chlorhexidine Gluconate Cloth  6 each Topical Daily  . cholecalciferol  1,000 Units Oral Daily  . enoxaparin (LOVENOX) injection  40 mg Subcutaneous Q24H  . insulin aspart  0-15 Units Subcutaneous TID WC  . insulin aspart  0-5 Units Subcutaneous QHS  . insulin detemir  12 Units Subcutaneous QHS  . latanoprost  1 drop Right Eye QHS  . multivitamin with minerals  1 tablet Oral Daily  . polyethylene glycol  17 g Oral Daily  . polyethylene glycol  34 g Oral Q2H  . Ensure Max Protein  11 oz Oral TID  . simvastatin  20 mg Oral Daily  . tamsulosin  0.4 mg Oral QPC supper  . cyanocobalamin  2,000 mcg Oral Daily   Continuous Infusions:   LOS: 6 days    Darlin Priestly, MD [facility]  If 7PM-7AM, please contact night-coverage [email or web address]  01/20/2020, 4:20 PM

## 2020-01-21 DIAGNOSIS — I739 Peripheral vascular disease, unspecified: Secondary | ICD-10-CM | POA: Diagnosis not present

## 2020-01-21 DIAGNOSIS — R531 Weakness: Secondary | ICD-10-CM | POA: Diagnosis not present

## 2020-01-21 DIAGNOSIS — M48061 Spinal stenosis, lumbar region without neurogenic claudication: Secondary | ICD-10-CM | POA: Diagnosis not present

## 2020-01-21 DIAGNOSIS — Z87898 Personal history of other specified conditions: Secondary | ICD-10-CM | POA: Diagnosis not present

## 2020-01-21 LAB — MAGNESIUM: Magnesium: 1.7 mg/dL (ref 1.7–2.4)

## 2020-01-21 LAB — BASIC METABOLIC PANEL
Anion gap: 9 (ref 5–15)
BUN: 36 mg/dL — ABNORMAL HIGH (ref 8–23)
CO2: 28 mmol/L (ref 22–32)
Calcium: 9.3 mg/dL (ref 8.9–10.3)
Chloride: 101 mmol/L (ref 98–111)
Creatinine, Ser: 0.97 mg/dL (ref 0.61–1.24)
GFR, Estimated: >60 mL/min (ref >60)
Glucose, Bld: 170 mg/dL — ABNORMAL HIGH (ref 70–99)
Potassium: 3.9 mmol/L (ref 3.5–5.1)
Sodium: 138 mmol/L (ref 135–145)

## 2020-01-21 LAB — GLUCOSE, CAPILLARY
Glucose-Capillary: 157 mg/dL — ABNORMAL HIGH (ref 70–99)
Glucose-Capillary: 176 mg/dL — ABNORMAL HIGH (ref 70–99)
Glucose-Capillary: 203 mg/dL — ABNORMAL HIGH (ref 70–99)
Glucose-Capillary: 214 mg/dL — ABNORMAL HIGH (ref 70–99)

## 2020-01-21 LAB — CBC
HCT: 39.8 % (ref 39.0–52.0)
Hemoglobin: 13.4 g/dL (ref 13.0–17.0)
MCH: 31.9 pg (ref 26.0–34.0)
MCHC: 33.7 g/dL (ref 30.0–36.0)
MCV: 94.8 fL (ref 80.0–100.0)
Platelets: 237 10*3/uL (ref 150–400)
RBC: 4.2 MIL/uL — ABNORMAL LOW (ref 4.22–5.81)
RDW: 12 % (ref 11.5–15.5)
WBC: 9.3 10*3/uL (ref 4.0–10.5)
nRBC: 0 % (ref 0.0–0.2)

## 2020-01-21 MED ORDER — FLEET ENEMA 7-19 GM/118ML RE ENEM
1.0000 | ENEMA | Freq: Once | RECTAL | Status: AC
  Administered 2020-01-21: 1 via RECTAL

## 2020-01-21 NOTE — Progress Notes (Signed)
PROGRESS NOTE    Roy [name].  ZHG:[id number] DOB: 15-Aug-1945 DOA: 01/14/2020 PCP: Rigoberto Noel, MD   Brief Narrative: Taken from H&P Roy [name] a 74 y.o.mw h/o DM, HTN,  presents to ED forevaluation of bilateral lower extremity weakness. Patient attempted to use the bathroom this morning but was so weak in his lower extremities that he could not stand from the toilet. He reports severe progressively worsening bilateral lower extremity weakness despite getting home physical therapy. Patient reports associated low back pain which radiates down the backs of both legs.  Per wife patient has progressively worsening lower extremity weakness and overall declined in his have for the past 1 year.  Up till few weeks ago he was able to walk around the house with walker which is becoming difficult now.  For the past couple of weeks he is spending most of the time in chair and having frequent falls. Patient has history of spinal stenosis and diabetic neuropathy involving both lower extremities. Neurosurgery was also consulted, surgery got postponed till next week as patient was on aspirin.  Discontinuing aspirin today.  Subjective: Patient continued to experience right lower quadrant pain.  Still no good bowel movement.  Discussed about getting an enema and he agrees.  We will discussed about postponement of his surgery and he agrees to proceed with SNF and will be back next week for surgery if the bed is available. Wife was in the room.  Assessment & Plan:   Active Problems:   Lower extremity weakness   Weakness   Spinal stenosis of lumbar region   History of progressive weakness   Claudication (HCC)  Bilateral lower extremity weakness with gait instability.   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  Multiple etiologies can be contributory which include severe spinal stenosis, diabetic neuropathy and a  possible vasculopathy.  Neurosurgery was consulted and they recommend doing a thoracic MRI and ABI-waiting their comment on thoracic MRI which also shows stenosis and some disc bulging.  ABI did not show any vascular disease. Good strength on dorsiflexion and plantarflexion on bilateral feet, poor effort to lift the leg up stating that he cannot do it. B12 came back within normal limit, vitamin D low. 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  Plan is to have surgery next week.  Per neurosurgery recommendation he can be discharged to SNF and they can arrange surgery for next week with a new admission. -Continue with pain management. -continue vitamin D supplement. -Continue vitamin B12 supplement -Discontinue aspirin.  Urinary retention.   Patient with new urinary retention and inability to void.  Not on any BPH medication before. -Multiple I/O performed for retention before Foley inserted 01/19/20.Marland Kitchen  PLAN: -cont Foley for bladder decompression.  -continue Flomax (new) -Outpatient urology follow-up for voiding trial and further management.  Elevated troponin.  Mildly elevated troponin with a flat curve.  No chest pain. Most likely secondary to demand.  Echocardiogram obtained with normal EF, grade 1 diastolic dysfunction and no regional wall motion abnormalities.  Type 2 diabetes mellitus.  CBG mildly elevated. Patient was on Metformin and Levemir at home.  A1c in June 2021 was 6.5 Repeat A1c yesterday was 5.8. PLAN: --continue Levemir 12u nightly --SSI  Hypertension.   Blood pressure within goal. -continue home coreg and amlodipine  Hyperlipidemia. -Continue home  dose of Zocor  History of glaucoma. -Continue home eye drops  RLQ 2/2 stool impaction --Pt reported eating very well but having only watery stool with straining. KUB with full of stool on right. -Fleet enema  x1. -Continue with daily MiraLAX.  Objective: Vitals:   01/20/20 1946 01/21/20 0337 01/21/20 0737 01/21/20 1204  BP: 129/74 124/72 138/76 139/80  Pulse: 68 61 67 60  Resp:   17 17  Temp: 98.9 F (37.2 C) 98.2 F (36.8 C) 98.1 F (36.7 C) 98 F (36.7 C)  TempSrc: Oral Oral    SpO2: 98% 97% 94% 97%  Weight:  117.6 kg    Height:        Intake/Output Summary (Last 24 hours) at 01/21/2020 1549 Last data filed at 01/21/2020 1403 Gross per 24 hour  Intake 480 ml  Output 1801 ml  Net -1321 ml   Filed Weights   01/18/20 0413 01/19/20 0405 01/21/20 0337  Weight: 116.8 kg 117.3 kg 117.6 kg    Examination:  General.  Well-developed, obese gentleman, in no acute distress. Pulmonary.  Lungs clear bilaterally, normal respiratory effort. CV.  Regular rate and rhythm, no JVD, rub or murmur. Abdomen.  Soft, nontender, nondistended, BS positive. CNS.  Alert and oriented x3.  No focal neurologic deficit. Extremities.  No edema, no cyanosis, pulses intact and symmetrical. Psychiatry.  Judgment and insight appears normal.  DVT prophylaxis: Lovenox Code Status: Full Family Communication: Wife was updated at bedside. Disposition Plan:  Status is: Inpatient  Remains inpatient appropriate because:Inpatient level of care appropriate due to severity of illness   Dispo: The patient is from: Home              Anticipated d/c is to: SNF              Anticipated d/c date is: 1-2 days .  Waiting for insurance authorization.  Will come back next week for spinal surgery.               Consultants:   Neurosurgery  Procedures:  Antimicrobials:   Data Reviewed: I have personally reviewed following labs and imaging studies  CBC: Recent Labs  Lab 01/15/20 0348 01/18/20 0447 01/20/20 0508 01/21/20 0501  WBC 8.9 10.5 8.9 9.3  HGB 15.5 13.8 13.4 13.4  HCT 46.0 40.8 40.4 39.8  MCV 95.0 95.6 96.0 94.8  PLT 181 160 208 237   Basic Metabolic Panel: Recent Labs  Lab 01/14/20 2154  01/15/20 0348 01/18/20 0448 01/20/20 0508 01/21/20 0501  NA  --  142 138 139 138  K  --  3.6 3.4* 3.6 3.9  CL  --  104 103 102 101  CO2  --  27 25 26 28   GLUCOSE  --  118* 154* 188* 170*  BUN  --  19 38* 38* 36*  CREATININE  --  0.93 1.09 1.01 0.97  CALCIUM  --  9.7 9.3 9.4 9.3  MG 1.7  --   --  2.0 1.7  PHOS 2.7  --   --   --   --    GFR: Estimated Creatinine Clearance: 93.7 mL/min (by C-G formula based on SCr of 0.97 mg/dL). Liver Function Tests: No results for input(s): AST, ALT, ALKPHOS, BILITOT, PROT, ALBUMIN in the last 168 hours. No results for input(s): LIPASE, AMYLASE in the last 168 hours. No results for input(s): AMMONIA in the last 168 hours. Coagulation Profile: No results for input(s): INR, PROTIME in the last 168 hours. Cardiac Enzymes:  No results for input(s): CKTOTAL, CKMB, CKMBINDEX, TROPONINI in the last 168 hours. BNP (last 3 results) No results for input(s): PROBNP in the last 8760 hours. HbA1C: No results for input(s): HGBA1C in the last 72 hours. CBG: Recent Labs  Lab 01/20/20 1122 01/20/20 1707 01/20/20 2217 01/21/20 0737 01/21/20 1203  GLUCAP 185* 231* 175* 157* 176*   Lipid Profile: No results for input(s): CHOL, HDL, LDLCALC, TRIG, CHOLHDL, LDLDIRECT in the last 72 hours. Thyroid Function Tests: No results for input(s): TSH, T4TOTAL, FREET4, T3FREE, THYROIDAB in the last 72 hours. Anemia Panel: No results for input(s): VITAMINB12, FOLATE, FERRITIN, TIBC, IRON, RETICCTPCT in the last 72 hours. Sepsis Labs: No results for input(s): PROCALCITON, LATICACIDVEN in the last 168 hours.  Recent Results (from the past 240 hour(s))  Respiratory Panel by RT PCR (Flu A&B, Covid) - Nasopharyngeal Swab     Status: None   Collection Time: 01/14/20  8:52 PM   Specimen: Nasopharyngeal Swab  Result Value Ref Range Status   SARS Coronavirus 2 by RT PCR NEGATIVE NEGATIVE Final    Comment: (NOTE) SARS-CoV-2 target nucleic acids are NOT DETECTED.  The  SARS-CoV-2 RNA is generally detectable in upper respiratoy specimens during the acute phase of infection. The lowest concentration of SARS-CoV-2 viral copies this assay can detect is 131 copies/mL. A negative result does not preclude SARS-Cov-2 infection and should not be used as the sole basis for treatment or other patient management decisions. A negative result may occur with  improper specimen collection/handling, submission of specimen other than nasopharyngeal swab, presence of viral mutation(s) within the areas targeted by this assay, and inadequate number of viral copies (<131 copies/mL). A negative result must be combined with clinical observations, patient history, and epidemiological information. The expected result is Negative.  Fact Sheet for Patients:  https://www.moore.com/  Fact Sheet for Healthcare Providers:  https://www.young.biz/  This test is no t yet approved or cleared by the [place] FDA and  has been authorized for detection and/or diagnosis of SARS-CoV-2 by FDA under an Emergency Use Authorization (EUA). This EUA will remain  in effect (meaning this test can be used) for the duration of the COVID-19 declaration under Section 564(b)(1) of the Act, 21 U.S.C. section 360bbb-3(b)(1), unless the authorization is terminated or revoked sooner.     Influenza A by PCR NEGATIVE NEGATIVE Final   Influenza B by PCR NEGATIVE NEGATIVE Final    Comment: (NOTE) The Xpert Xpress SARS-CoV-2/FLU/RSV assay is intended as an aid in  the diagnosis of influenza from Nasopharyngeal swab specimens and  should not be used as a sole basis for treatment. Nasal washings and  aspirates are unacceptable for Xpert Xpress SARS-CoV-2/FLU/RSV  testing.  Fact Sheet for Patients: https://www.moore.com/  Fact Sheet for Healthcare Providers: https://www.young.biz/  This test is not yet approved or cleared  by the [place] FDA and  has been authorized for detection and/or diagnosis of SARS-CoV-2 by  FDA under an Emergency Use Authorization (EUA). This EUA will remain  in effect (meaning this test can be used) for the duration of the  Covid-19 declaration under Section 564(b)(1) of the Act, 21  U.S.C. section 360bbb-3(b)(1), unless the authorization is  terminated or revoked. Performed at [facility], [address] [place], [place] [address]      Radiology Studies: DG Abd 1 View  Result Date: 01/20/2020 CLINICAL DATA:  Right lower quadrant pain EXAM: ABDOMEN - 1 VIEW COMPARISON:  None. FINDINGS: There is nonspecific gaseous distention throughout the  majority of the colon. There is a large amount of stool in the right hemicolon. There is no pneumatosis or free air. No evidence for small bowel obstruction. Advanced multilevel degenerative changes are noted throughout the lumbar spine. There are degenerative changes of both hips. No convincing radiopaque kidney stone. IMPRESSION: Nonspecific gaseous distention throughout the majority of the colon. Large amount of stool in the right hemicolon. No evidence for small bowel obstruction. Electronically Signed   By: Katherine Mantle M.D.   On: 01/20/2020 19:32   MR CERVICAL SPINE WO CONTRAST  Result Date: 01/19/2020 CLINICAL DATA:  Myelopathy, acute or progressive. Bilateral lower extremity weakness. Unable to stand from the toilet. EXAM: MRI CERVICAL SPINE WITHOUT CONTRAST TECHNIQUE: Multiplanar, multisequence MR imaging of the cervical spine was performed. No intravenous contrast was administered. COMPARISON:  None. FINDINGS: Alignment: No significant listhesis is present. Straightening of the normal lumbar lordosis is noted. Vertebrae: Edematous endplate marrow changes are present C7-T1. Chronic changes are present at C5-6 and C6-7. Vertebral body heights are maintained. Marrow signal is otherwise normal. Cord: Focal T2 signal  hyperintensity is present at C3-4. Cord signal and morphology is otherwise normal. Posterior Fossa, vertebral arteries, paraspinal tissues: Craniocervical junction is normal. Vertebral arteries are patent. Disc levels: C2-3: Broad-based disc osteophyte complex present. Uncovertebral spurring and facet disease contribute to moderate right and mild left foraminal stenosis. Central canal is patent. C3-4: A broad-based disc osteophyte complex present. Vance facet hypertrophy is worse on left. This results in severe central canal stenosis. The canal is narrowed to 4 mm. Severe left and moderate right foraminal stenosis is present. C4-5: A broad-based disc osteophyte complex partially effaces the ventral CSF. Uncovertebral and facet hypertrophy result in moderate right and mild left foraminal stenosis. C5-6: Uncovertebral and facet disease is worse on the right. Severe right and moderate left foraminal stenosis is present. The central canal is patent. C6-7: Uncovertebral and facet spurring is present bilaterally. Moderate bilateral foraminal narrowing is present. The central canal is patent. C7-T1: Uncovertebral spurring is present bilaterally. Mild facet hypertrophy is noted bilaterally. Central canal is patent. Moderate foraminal narrowing is evident bilaterally. IMPRESSION: 1. 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 The canal is narrowed to 4 mm. Focal T2 cord signal abnormality is present at this level, most consistent with edema. 2. Moderate right and mild left foraminal stenosis at C2-3. 3. Moderate right and mild left foraminal stenosis at C4-5. 4. Severe right and moderate left foraminal stenosis at C5-6. 5. Moderate bilateral foraminal narrowing at C6-7 and C7-T1. Electronically Signed   By: Marin Roberts M.D.   On: 01/19/2020 19:50    Scheduled Meds: . amLODipine  5 mg Oral Daily  . brimonidine  1 drop Right Eye BID  . carvedilol  6.25 mg Oral BID WC  . Chlorhexidine Gluconate Cloth  6 each Topical  Daily  . cholecalciferol  1,000 Units Oral Daily  . enoxaparin (LOVENOX) injection  40 mg Subcutaneous Q24H  . insulin aspart  0-15 Units Subcutaneous TID WC  . insulin aspart  0-5 Units Subcutaneous QHS  . insulin detemir  12 Units Subcutaneous QHS  . latanoprost  1 drop Right Eye QHS  . multivitamin with minerals  1 tablet Oral Daily  . polyethylene glycol  17 g Oral Daily  . Ensure Max Protein  11 oz Oral TID  . simvastatin  20 mg Oral Daily  . tamsulosin  0.4 mg Oral QPC supper  . cyanocobalamin  2,000 mcg Oral Daily   Continuous  Infusions:   LOS: 7 days    Arnetha Courser, MD [facility]  If 7PM-7AM, please contact night-coverage [email or web address]  01/21/2020, 3:49 PM

## 2020-01-21 NOTE — Care Management Important Message (Signed)
Important Message  Patient Details  Name: Roy [name]. MRN: [id number] Date of Birth: [birth year]   Medicare Important Message Given:  Yes     [name] 01/21/2020, 12:08 PM

## 2020-01-22 DIAGNOSIS — M48061 Spinal stenosis, lumbar region without neurogenic claudication: Secondary | ICD-10-CM | POA: Diagnosis not present

## 2020-01-22 DIAGNOSIS — Z87898 Personal history of other specified conditions: Secondary | ICD-10-CM | POA: Diagnosis not present

## 2020-01-22 DIAGNOSIS — I739 Peripheral vascular disease, unspecified: Secondary | ICD-10-CM | POA: Diagnosis not present

## 2020-01-22 DIAGNOSIS — R531 Weakness: Secondary | ICD-10-CM | POA: Diagnosis not present

## 2020-01-22 LAB — CBC
HCT: 40.1 % (ref 39.0–52.0)
Hemoglobin: 13.5 g/dL (ref 13.0–17.0)
MCH: 32.1 pg (ref 26.0–34.0)
MCHC: 33.7 g/dL (ref 30.0–36.0)
MCV: 95.2 fL (ref 80.0–100.0)
Platelets: 272 10*3/uL (ref 150–400)
RBC: 4.21 MIL/uL — ABNORMAL LOW (ref 4.22–5.81)
RDW: 12 % (ref 11.5–15.5)
WBC: 8.4 10*3/uL (ref 4.0–10.5)
nRBC: 0 % (ref 0.0–0.2)

## 2020-01-22 LAB — BASIC METABOLIC PANEL
Anion gap: 9 (ref 5–15)
BUN: 29 mg/dL — ABNORMAL HIGH (ref 8–23)
CO2: 27 mmol/L (ref 22–32)
Calcium: 9.3 mg/dL (ref 8.9–10.3)
Chloride: 102 mmol/L (ref 98–111)
Creatinine, Ser: 0.92 mg/dL (ref 0.61–1.24)
GFR, Estimated: >60 mL/min (ref >60)
Glucose, Bld: 175 mg/dL — ABNORMAL HIGH (ref 70–99)
Potassium: 3.8 mmol/L (ref 3.5–5.1)
Sodium: 138 mmol/L (ref 135–145)

## 2020-01-22 LAB — GLUCOSE, CAPILLARY
Glucose-Capillary: 176 mg/dL — ABNORMAL HIGH (ref 70–99)
Glucose-Capillary: 202 mg/dL — ABNORMAL HIGH (ref 70–99)
Glucose-Capillary: 207 mg/dL — ABNORMAL HIGH (ref 70–99)
Glucose-Capillary: 279 mg/dL — ABNORMAL HIGH (ref 70–99)

## 2020-01-22 LAB — MAGNESIUM: Magnesium: 1.7 mg/dL (ref 1.7–2.4)

## 2020-01-22 NOTE — Progress Notes (Signed)
Progress Note   Date: 01/22/2020  Subjective: Roy [name] continues to have weakness in arms and legs. His abdominal pain has improved and he did have a bowel movement. He has worked with PT previously but not in last few days. Aspirin was stopped yesterday  Vital Signs: Temp:  [97.8 F (36.6 C)-98.4 F (36.9 C)] 97.8 F (36.6 C) (10/12 0458) Pulse Rate:  [60-73] 71 (10/12 0458) Resp:  [17-18] 18 (10/12 0458) BP: (139-150)/(63-86) 139/63 (10/12 0458) SpO2:  [95 %-97 %] 95 % (10/12 0458) Weight:  [115.8 kg] 115.8 kg (10/12 0458) Temp (24hrs), Avg:98 F (36.7 C), Min:97.8 F (36.6 C), Max:98.4 F (36.9 C)  Weight: 115.8 kg  Problem List Patient Active Problem List   Diagnosis Date Noted  . Weakness   . Spinal stenosis of lumbar region   . History of progressive weakness   . Claudication (HCC)   . Lower extremity weakness 01/14/2020    Medications: Scheduled Meds: . amLODipine  5 mg Oral Daily  . brimonidine  1 drop Right Eye BID  . carvedilol  6.25 mg Oral BID WC  . Chlorhexidine Gluconate Cloth  6 each Topical Daily  . cholecalciferol  1,000 Units Oral Daily  . enoxaparin (LOVENOX) injection  40 mg Subcutaneous Q24H  . insulin aspart  0-15 Units Subcutaneous TID WC  . insulin aspart  0-5 Units Subcutaneous QHS  . insulin detemir  12 Units Subcutaneous QHS  . latanoprost  1 drop Right Eye QHS  . multivitamin with minerals  1 tablet Oral Daily  . polyethylene glycol  17 g Oral Daily  . Ensure Max Protein  11 oz Oral TID  . simvastatin  20 mg Oral Daily  . tamsulosin  0.4 mg Oral QPC supper  . cyanocobalamin  2,000 mcg Oral Daily   Continuous Infusions: PRN Meds:.acetaminophen **OR** acetaminophen, albuterol, bisacodyl, famotidine, ipratropium, melatonin, morphine injection, ondansetron **OR** ondansetron (ZOFRAN) IV, senna-docusate, traMADol  Labs:  Lab Results  Component Value Date   WBC 8.4 01/22/2020   WBC 9.3 01/21/2020   HCT 40.1 01/22/2020   HCT  39.8 01/21/2020   PLT 272 01/22/2020   PLT 237 01/21/2020   No results found for: LABPT, INR, APTT Lab Results  Component Value Date   NA 138 01/22/2020   NA 138 01/21/2020   K 3.8 01/22/2020   K 3.9 01/21/2020   BUN 29 (H) 01/22/2020   BUN 36 (H) 01/21/2020    Lab Results  Component Value Date   MG 1.7 01/22/2020   Exam: Strength: 4-/5 in bilateral grip, interossei, 5/5 in bicep and tricep, deltoid restricted by pain 5/5 in bilateral lower extremities including hip flexion Sensation: Decreased in lateral left hand, otherwise intact to LT throughout   MRI Cervical Spine: 1. 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 The canal is narrowed to 4 mm. Focal T2 cord signal abnormality is present at this level, most consistent with edema. 2. Moderate right and mild left foraminal stenosis at C2-3. 3. Moderate right and mild left foraminal stenosis at C4-5. 4. Severe right and moderate left foraminal stenosis at C5-6. 5. Moderate bilateral foraminal narrowing at C6-7 and C7-T1.  A/P Roy. [name] has weakness and symptoms concerning for myelopathy. MRI Cervical spine shows severe stenosis at C3/4. He additionally has lumbar spinal stenosis that is severe  #Cervical spinal stenosis - Surgery for C3/4 ACDF planned for 10/19, patient agrees to proceed - OK for DVT prophylaxis, stop 24 hours prior - Continue to work with  PT - OK for discharge and return for surgery  #Lumbar Stenosis - Will treat on outpatient basis after recovery from cervical spine surgery   [name], MD

## 2020-01-22 NOTE — Plan of Care (Signed)

## 2020-01-22 NOTE — Progress Notes (Signed)
PROGRESS NOTE    Roy [name].  QIH:[id number] DOB: 02-27-1946 DOA: 01/14/2020 PCP: Rigoberto Noel, MD   Brief Narrative: Taken from H&P [name] a 74 y.o.mw h/o DM, HTN,  presents to ED forevaluation of bilateral lower extremity weakness. Patient attempted to use the bathroom this morning but was so weak in his lower extremities that he could not stand from the toilet. He reports severe progressively worsening bilateral lower extremity weakness despite getting home physical therapy. Patient reports associated low back pain which radiates down the backs of both legs.  Per wife patient has progressively worsening lower extremity weakness and overall declined in his have for the past 1 year.  Up till few weeks ago he was able to walk around the house with walker which is becoming difficult now.  For the past couple of weeks he is spending most of the time in chair and having frequent falls. Patient has history of spinal stenosis and diabetic neuropathy involving both lower extremities. Neurosurgery was also consulted, surgery got postponed till next Tuesday as patient was on aspirin.  Discontinuing aspirin on 01/22/2020. Insurance is refusing SNF placement at this time asking for peer to peer review-tried calling twice and unable to reach anywhere.  Subjective: Right lower quadrant abdominal pain seems improving.  Had a bowel movement.  Wife was concerned that insurance is not approving his stay to SNF at this time.  Assessment & Plan:   Active Problems:   Lower extremity weakness   Weakness   Spinal stenosis of lumbar region   History of progressive weakness   Claudication (HCC)  Bilateral lower extremity weakness with gait instability.   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  Multiple etiologies can be contributory which include severe spinal stenosis, diabetic neuropathy and a possible  vasculopathy.  Neurosurgery was consulted and they recommend doing a thoracic MRI and ABI-waiting their comment on thoracic MRI which also shows stenosis and some disc bulging.  ABI did not show any vascular disease. Good strength on dorsiflexion and plantarflexion on bilateral feet, poor effort to lift the leg up stating that he cannot do it. B12 came back within normal limit, vitamin D low. 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  Plan is to have surgery next week, rescheduled for next Tuesday. per neurosurgery recommendation he can be discharged to SNF and they can arrange surgery for next week with a new admission. -Continue with pain management. -continue vitamin D supplement. -Continue vitamin B12 supplement -Discontinue aspirin-discontinued on 01/22/2020.  Urinary retention.   Patient with new urinary retention and inability to void.  Not on any BPH medication before. -Multiple I/O performed for retention before Foley inserted 01/19/20.Marland Kitchen  PLAN: -cont Foley for bladder decompression.  -continue Flomax (new) -Outpatient urology follow-up for voiding trial and further management. -Can trial voiding trial if patient stays inpatient till next week.  Elevated troponin.  Mildly elevated troponin with a flat curve.  No chest pain. Most likely secondary to demand.  Echocardiogram obtained with normal EF, grade 1 diastolic dysfunction and no regional wall motion abnormalities.  Type 2 diabetes mellitus.  CBG mildly elevated. Patient was on Metformin and Levemir at home.  A1c in June 2021 was 6.5 Repeat A1c yesterday was 5.8. PLAN: --continue Levemir 12u nightly --SSI  Hypertension.   Blood pressure within goal. -  continue home coreg and amlodipine  Hyperlipidemia. -Continue home dose of Zocor  History of glaucoma. -Continue home eye drops  RLQ 2/2 stool impaction --Pt  reported eating very well but having only watery stool with straining. KUB with full of stool on right.  Had a bowel movement with enema yesterday. Some relief in his pain. -Continue with daily MiraLAX.  Objective: Vitals:   01/21/20 2030 01/22/20 0458 01/22/20 0844 01/22/20 1159  BP: 140/78 139/63 136/77 132/72  Pulse: 68 71 63 (!) 56  Resp: 18 18 16 17   Temp: 98.4 F (36.9 C) 97.8 F (36.6 C) 97.9 F (36.6 C) 98 F (36.7 C)  TempSrc: Oral Oral  Oral  SpO2: 97% 95% 95% 93%  Weight:  115.8 kg    Height:        Intake/Output Summary (Last 24 hours) at 01/22/2020 1512 Last data filed at 01/22/2020 1330 Gross per 24 hour  Intake 480 ml  Output 2400 ml  Net -1920 ml   Filed Weights   01/19/20 0405 01/21/20 0337 01/22/20 0458  Weight: 117.3 kg 117.6 kg 115.8 kg    Examination:  General.  Well-developed gentleman, in no acute distress. Pulmonary.  Lungs clear bilaterally, normal respiratory effort. CV.  Regular rate and rhythm, no JVD, rub or murmur. Abdomen.  Soft, nontender, nondistended, BS positive. CNS.  Alert and oriented x3.  No focal neurologic deficit. Extremities.  No edema, no cyanosis, pulses intact and symmetrical. Psychiatry.  Judgment and insight appears normal.  DVT prophylaxis: Lovenox Code Status: Full Family Communication: Wife was updated at bedside. Disposition Plan:  Status is: Inpatient  Remains inpatient appropriate because:Inpatient level of care appropriate due to severity of illness   Dispo: The patient is from: Home              Anticipated d/c is to: SNF              Anticipated d/c date is: 1-2 days .  Original plan was to send him to SNF for rehab and then come back for surgery next week.  Insurance is refusing.  Unable to reach anywhere after trying for peer to peer.        Consultants:   Neurosurgery  Procedures:  Antimicrobials:   Data Reviewed: I have personally reviewed following labs and imaging studies  CBC: Recent  Labs  Lab 01/18/20 0447 01/20/20 0508 01/21/20 0501 01/22/20 0434  WBC 10.5 8.9 9.3 8.4  HGB 13.8 13.4 13.4 13.5  HCT 40.8 40.4 39.8 40.1  MCV 95.6 96.0 94.8 95.2  PLT 160 208 237 272   Basic Metabolic Panel: Recent Labs  Lab 01/18/20 0448 01/20/20 0508 01/21/20 0501 01/22/20 0434  NA 138 139 138 138  K 3.4* 3.6 3.9 3.8  CL 103 102 101 102  CO2 25 26 28 27   GLUCOSE 154* 188* 170* 175*  BUN 38* 38* 36* 29*  CREATININE 1.09 1.01 0.97 0.92  CALCIUM 9.3 9.4 9.3 9.3  MG  --  2.0 1.7 1.7   GFR: Estimated Creatinine Clearance: 98 mL/min (by C-G formula based on SCr of 0.92 mg/dL). Liver Function Tests: No results for input(s): AST, ALT, ALKPHOS, BILITOT, PROT, ALBUMIN in the last 168 hours. No results for input(s): LIPASE, AMYLASE in the last 168 hours. No results for input(s): AMMONIA in the last 168 hours. Coagulation Profile: No results for input(s): INR, PROTIME in the last 168 hours. Cardiac Enzymes: No results for input(s): CKTOTAL, CKMB, CKMBINDEX, TROPONINI in the last 168  hours. BNP (last 3 results) No results for input(s): PROBNP in the last 8760 hours. HbA1C: No results for input(s): HGBA1C in the last 72 hours. CBG: Recent Labs  Lab 01/21/20 1203 01/21/20 1640 01/21/20 2032 01/22/20 0846 01/22/20 1202  GLUCAP 176* 203* 214* 176* 202*   Lipid Profile: No results for input(s): CHOL, HDL, LDLCALC, TRIG, CHOLHDL, LDLDIRECT in the last 72 hours. Thyroid Function Tests: No results for input(s): TSH, T4TOTAL, FREET4, T3FREE, THYROIDAB in the last 72 hours. Anemia Panel: No results for input(s): VITAMINB12, FOLATE, FERRITIN, TIBC, IRON, RETICCTPCT in the last 72 hours. Sepsis Labs: No results for input(s): PROCALCITON, LATICACIDVEN in the last 168 hours.  Recent Results (from the past 240 hour(s))  Respiratory Panel by RT PCR (Flu A&B, Covid) - Nasopharyngeal Swab     Status: None   Collection Time: 01/14/20  8:52 PM   Specimen: Nasopharyngeal Swab    Result Value Ref Range Status   SARS Coronavirus 2 by RT PCR NEGATIVE NEGATIVE Final    Comment: (NOTE) SARS-CoV-2 target nucleic acids are NOT DETECTED.  The SARS-CoV-2 RNA is generally detectable in upper respiratoy specimens during the acute phase of infection. The lowest concentration of SARS-CoV-2 viral copies this assay can detect is 131 copies/mL. A negative result does not preclude SARS-Cov-2 infection and should not be used as the sole basis for treatment or other patient management decisions. A negative result may occur with  improper specimen collection/handling, submission of specimen other than nasopharyngeal swab, presence of viral mutation(s) within the areas targeted by this assay, and inadequate number of viral copies (<131 copies/mL). A negative result must be combined with clinical observations, patient history, and epidemiological information. The expected result is Negative.  Fact Sheet for Patients:  https://www.moore.com/  Fact Sheet for Healthcare Providers:  https://www.young.biz/  This test is no t yet approved or cleared by the [place] FDA and  has been authorized for detection and/or diagnosis of SARS-CoV-2 by FDA under an Emergency Use Authorization (EUA). This EUA will remain  in effect (meaning this test can be used) for the duration of the COVID-19 declaration under Section 564(b)(1) of the Act, 21 U.S.C. section 360bbb-3(b)(1), unless the authorization is terminated or revoked sooner.     Influenza A by PCR NEGATIVE NEGATIVE Final   Influenza B by PCR NEGATIVE NEGATIVE Final    Comment: (NOTE) The Xpert Xpress SARS-CoV-2/FLU/RSV assay is intended as an aid in  the diagnosis of influenza from Nasopharyngeal swab specimens and  should not be used as a sole basis for treatment. Nasal washings and  aspirates are unacceptable for Xpert Xpress SARS-CoV-2/FLU/RSV  testing.  Fact Sheet for  Patients: https://www.moore.com/  Fact Sheet for Healthcare Providers: https://www.young.biz/  This test is not yet approved or cleared by the [place] FDA and  has been authorized for detection and/or diagnosis of SARS-CoV-2 by  FDA under an Emergency Use Authorization (EUA). This EUA will remain  in effect (meaning this test can be used) for the duration of the  Covid-19 declaration under Section 564(b)(1) of the Act, 21  U.S.C. section 360bbb-3(b)(1), unless the authorization is  terminated or revoked. Performed at [facility], [address], [place], [place] [address]      Radiology Studies: No results found.  Scheduled Meds: . amLODipine  5 mg Oral Daily  . brimonidine  1 drop Right Eye BID  . carvedilol  6.25 mg Oral BID WC  . Chlorhexidine Gluconate Cloth  6 each Topical Daily  .  cholecalciferol  1,000 Units Oral Daily  . enoxaparin (LOVENOX) injection  40 mg Subcutaneous Q24H  . insulin aspart  0-15 Units Subcutaneous TID WC  . insulin aspart  0-5 Units Subcutaneous QHS  . insulin detemir  12 Units Subcutaneous QHS  . latanoprost  1 drop Right Eye QHS  . multivitamin with minerals  1 tablet Oral Daily  . polyethylene glycol  17 g Oral Daily  . Ensure Max Protein  11 oz Oral TID  . simvastatin  20 mg Oral Daily  . tamsulosin  0.4 mg Oral QPC supper  . cyanocobalamin  2,000 mcg Oral Daily   Continuous Infusions:   LOS: 8 days    Arnetha Courser, MD [facility]  If 7PM-7AM, please contact night-coverage [email or web address]  01/22/2020, 3:12 PM

## 2020-01-22 NOTE — TOC Progression Note (Signed)
Transition of Care (TOC) - Progression Note    Patient Details  Name: Roy [name]. MRN: [id number] Date of Birth: January 12, 1946  Transition of [facility]) CM/SW Contact  Maree Krabbe, LCSW Phone Number: 01/22/2020, 3:45 PM  Clinical Narrative:  CSW spoke with pt's spouse via telephone. Pt's spouse states [employer] denied and MD was to do peer to peer prior to 12 today. The peer to peer was not completed so pt's spouse had the option to do a appeal. Pt's spouse did complete the appeal and Monia Pouch opened a expedited appeal asking that updated clinicals be faxed to [phone], and asked that MD call [phone] to complete appeal. CSW will need updated PT note to send in--PT notified.     Expected Discharge Plan: Skilled Nursing Facility Barriers to Discharge: Continued Medical Work up  Expected Discharge Plan and Services Expected Discharge Plan: Skilled Nursing Facility In-house Referral: Clinical Social Work Discharge Planning Services: NA Post Acute Care Choice: Skilled Nursing Facility Living arrangements for the past 2 months: Single Family Home                 DME Arranged: N/A DME Agency: NA       HH Arranged: NA HH Agency: NA         Social Determinants of Health (SDOH) Interventions    Readmission Risk Interventions No flowsheet data found.

## 2020-01-22 NOTE — Progress Notes (Signed)
Physical Therapy Treatment Patient Details Name: Roy [name]. MRN: [id number] DOB: 25-Mar-1946 Today's Date: 01/22/2020    History of Present Illness Roy [name] is a 74 y.o. male with a known history of diabetes, hypertension presents to the emergency department for evaluation of bilateral lower extremity weakness.  Patient attempted to use the bathroom this morning but was so weak in his lower extremities that he could not stand from the toilet.  He reports severe progressively worsening bilateral lower extremity weakness despite getting home physical therapy.  Patient reports associated low back pain which radiates down the backs of both legs.    PT Comments    Pt is pleasant & motivated to work with PT. Pt has very limited use of BUE and BLE (pt reports he had difficulty feeding himself at home and did it in a face>fork vs bringing fork to face motion). PT does don pt's personal B knee braces to provide support during standing. Pt is willing & able to stand with +2 assist from elevated EOB with PT providing cuing for safe hand placement, technique, and cuing for anterior pelvic shift during standing to achieve upright posture but pt continues to look down to ground vs forward. Pt stands twice, 2nd time with duration of ~30 seconds. Pt very motivated to increase independence with functional mobility and wife reports inability to care for pt at this current level of function. Pt would benefit from SNF level of care to maximize potential and to decrease burden of care.     Follow Up Recommendations  SNF;Supervision/Assistance - 24 hour     Equipment Recommendations   (TBD in next venue)    Recommendations for Other Services OT consult     Precautions / Restrictions Precautions Precautions: Fall Required Braces or Orthoses: Other Brace Other Brace: pt has personal B knee braces he brought from home Restrictions Weight Bearing Restrictions: No Other Position/Activity  Restrictions: B shoulder pain/weakness limting UE assist.    Mobility  Bed Mobility Overal bed mobility: Needs Assistance Bed Mobility: Supine to Sit;Sit to Supine;Rolling Rolling: Max assist   Supine to sit: Max assist;+2 for physical assistance;+2 for safety/equipment;HOB elevated Sit to supine: Max assist;+2 for physical assistance;+2 for safety/equipment;HOB elevated   General bed mobility comments: Pt with limited ability to hold bed rails to assist with bed mobiltiy 2/2 numbness/weakness in BUE  Transfers Overall transfer level: Needs assistance Equipment used: Rolling walker (2 wheeled) Transfers: Sit to/from [employer] Sit to Stand: From elevated surface;+2 safety/equipment;+2 physical assistance;Max assist (PT's blocking BLE knees to prevent buckling & provide stability)            Ambulation/Gait                 Stairs             Wheelchair Mobility    Modified Rankin (Stroke Patients Only)       Balance Overall balance assessment: Needs assistance Sitting-balance support: Feet supported;Bilateral upper extremity supported   Sitting balance - Comments: no LOB in sitting with BLEs supported on floor   Standing balance support: Bilateral upper extremity supported;During functional activity   Standing balance comment: min/mod assist for standing balance with BUE support on RW                            Cognition Arousal/Alertness: Awake/alert Behavior During Therapy: [facility] for tasks assessed/performed Overall Cognitive Status: Within Functional Limits for tasks assessed  Exercises      General Comments        Pertinent Vitals/Pain Pain Score: 5  Pain Location: "all over" Pain Descriptors / Indicators: Aching;Sore Pain Intervention(s): Limited activity within patient's tolerance;Monitored during session    Home Living                       Prior Function            PT Goals (current goals can now be found in the care plan section) Acute Rehab PT Goals Patient Stated Goal: to go to rehab PT Goal Formulation: With patient/family Time For Goal Achievement: 01/29/20 Potential to Achieve Goals: Fair Progress towards PT goals: Progressing toward goals    Frequency    Min 3X/week      PT Plan Current plan remains appropriate    Co-evaluation              AM-PAC PT "6 Clicks" Mobility   Outcome Measure  Help needed turning from your back to your side while in a flat bed without using bedrails?: A Lot Help needed moving from lying on your back to sitting on the side of a flat bed without using bedrails?: Total Help needed moving to and from a bed to a chair (including a wheelchair)?: Total Help needed standing up from a chair using your arms (e.g., wheelchair or bedside chair)?: Total Help needed to walk in hospital room?: Total Help needed climbing 3-5 steps with a railing? : Total 6 Click Score: 7    End of Session Equipment Utilized During Treatment: Gait belt (pt's personal B knee braces) Activity Tolerance: Patient tolerated treatment well Patient left: in bed;with call bell/phone within reach;with bed alarm set;with family/visitor present   PT Visit Diagnosis: Unsteadiness on feet (R26.81);Other abnormalities of gait and mobility (R26.89);Muscle weakness (generalized) (M62.81);Difficulty in walking, not elsewhere classified (R26.2);Repeated falls (R29.6)     Time: 3646-8032 PT Time Calculation (min) (ACUTE ONLY): 31 min  Charges:  $Therapeutic Activity: 23-37 mins                     Aleda Grana, PT, DPT 01/22/20, 4:06 PM

## 2020-01-23 DIAGNOSIS — M4802 Spinal stenosis, cervical region: Secondary | ICD-10-CM | POA: Diagnosis not present

## 2020-01-23 LAB — GLUCOSE, CAPILLARY
Glucose-Capillary: 176 mg/dL — ABNORMAL HIGH (ref 70–99)
Glucose-Capillary: 162 mg/dL — ABNORMAL HIGH (ref 70–99)
Glucose-Capillary: 255 mg/dL — ABNORMAL HIGH (ref 70–99)
Glucose-Capillary: 279 mg/dL — ABNORMAL HIGH (ref 70–99)

## 2020-01-23 MED ORDER — ENSURE MAX PROTEIN PO LIQD
11.0000 [oz_av] | Freq: Every day | ORAL | Status: DC
  Filled 2020-01-23 (×2): qty 330

## 2020-01-23 NOTE — Progress Notes (Signed)
Nutrition Follow Up Note   DOCUMENTATION CODES:   Obesity unspecified  INTERVENTION:   Ensure Max protein supplement daily, provides 150kcal and 30g of protein.  MVI daily   NUTRITION DIAGNOSIS:   Inadequate oral intake related to acute illness as evidenced by per patient/family report.  GOAL:   Patient will meet greater than or equal to 90% of their needs  -progressing   MONITOR:   PO intake, Supplement acceptance, Labs, Weight trends, Skin, I & O's  ASSESSMENT:   74 y.o. male with a known history of diabetes, hypertension presents to the emergency department for evaluation of bilateral lower extremity weakness.  Met with pt and pt's wife in room today. Pt reports improved appetite and oral intake in hospital; pt eating 80-100% of meals. Pt is refusing his Ensure supplements; pt reports that he does not feel he needs supplements as he is eating so well. RD discussed with pt the importance of adequate protein needed to preserve lean muscle, especially since he is laying around in the bed a lot more than usual. Pt agrees to drink one strawberry Ensure per day. Pt noted to have low vitamin D; this is being supplemented. B12 wnl. Per chart, pt is fairly weight stable since admit. Plan is for SNF at discharge.   Medications reviewed and include: D3, lovenox, insulin, MVI, miralax, B12  Labs reviewed: BUN 29(H) Vitamin D, 25-hydroxy 20.21(L), B12 811- 10/6 cbgs- 176, 162 x 24 hrs  Nutrition-Focused physical exam completed. Findings are no fat depletions, no muscle depletion and mild edema.   Diet Order:   Diet Order            Diet Carb Modified Fluid consistency: Thin; Room service appropriate? Yes  Diet effective now                EDUCATION NEEDS:   Education needs have been addressed  Skin:  Skin Assessment: Reviewed RN Assessment (ecchymosis, skin tear arm and finger)  Last BM:  10/13- TYPE 6  Height:   Ht Readings from Last 1 Encounters:  01/15/20 6' 4"   (1.93 m)    Weight:   Wt Readings from Last 1 Encounters:  01/23/20 116.4 kg    Ideal Body Weight:  91.8 kg  BMI:  Body mass index is 31.23 kg/m.  Estimated Nutritional Needs:   Kcal:  2500-2800kcal/day  Protein:  125-140g/day  Fluid:  >2.3L/day  Koleen Distance MS, RD, LDN Please refer to [facility] for RD and/or RD on-call/weekend/after hours pager

## 2020-01-23 NOTE — Evaluation (Signed)
Occupational Therapy Evaluation Patient Details Name: Roy [name]. MRN: [id number] DOB: [birth year] Today's Date: 01/23/2020    History of Present Illness Pt. is a 74 y.o. male who was admitted to [facility] with BUE, and LE weakness after being unable to stand from his toilet. 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 PMHx includes: DM, HTN, Hyperlipidemia, glaucoma, and left eye blindness. Pt. is scheduled for ACDF C3-4 on 01/29/20.   Clinical Impression   Pt. presents with impaired ROM, strength, sensation, and limited functional mobility which hinders his ability to complete basic ADLs, and IADL tasks. Pt. Resides at home with his wife who assists with all ADL, and IADL tasks as pt. Has experienced a progressive decline in his ability to perform daily tasks. Pt. was receiving HHOT, and PT in the home prior to admission. Pt. now requires max-totalA for ADL tasks. Pt./caregiver education was provided about positioning, retrograde massage for edema control, and  ROM. Pt. Could benefit from OT services for ADL training, A/E training, there. Ex., and pt. Education about positioning, ROM, home modification, and DME. Pt. Will need SNF level of care upon discharge with follow-up OT services to work towards maximizing functional independence with ADLS, and IADLs.     Follow Up Recommendations  SNF    Equipment Recommendations       Recommendations for Other Services       Precautions / Restrictions Precautions Precautions: Fall Required Braces or Orthoses: Other Brace Other Brace: pt has personal B knee braces he brought from home Restrictions Weight Bearing Restrictions: No      Mobility Bed Mobility Overal bed mobility: Needs Assistance Bed Mobility: Supine to Sit;Sit to Supine;Rolling Rolling: Max assist   Supine to sit: Max assist;+2 for physical assistance;HOB elevated;+2 for safety/equipment     General bed mobility comments: Per PT report  Transfers   Equipment used:  Rolling walker (2 wheeled) Transfers: Sit to/from [employer] Sit to Stand: +2 safety/equipment;+2 physical assistance;Max assist         General transfer comment: Per PT report    Balance                                           ADL either performed or assessed with clinical judgement   ADL Overall ADL's : Needs assistance/impaired                                       General ADL Comments: Pt. requires maxA with self-feeding, grooming. TotaA for UE, and LE bathing, and dressing.     Vision Baseline Vision/History: Glaucoma;Wears glasses Patient Visual Report: No change from baseline (History of left eye blindeness.)       Perception     Praxis      Pertinent Vitals/Pain Pain Assessment: 0-10     Hand Dominance Left   Extremity/Trunk Assessment Upper Extremity Assessment Upper Extremity Assessment: LUE deficits/detail (RUE generalized weakness. Hx of previous injury to the right hand limiting FMC, and sensation.) LUE Deficits / Details: Limited active shoulder AROM and strength. Pt. compensates with abduction when attempting isolated left shoulder flexion,Abduction 2-/5, elbow flexion 3-/5, wrist extension 3-/5. Thumb opposition to the 2nd digit. Pt. is able to achieve full digit extension, and composite fist, Posiitve edema in the left hand. LUE Sensation:  history of peripheral neuropathy           Communication Communication Communication: No difficulties   Cognition Arousal/Alertness: Awake/alert Behavior During Therapy: WFL for tasks assessed/performed Overall Cognitive Status: Within Functional Limits for tasks assessed                                     General Comments       Exercises     Shoulder Instructions      Home Living Family/patient expects to be discharged to:: Skilled nursing facility Living Arrangements: Spouse/significant other Available Help at Discharge:  Family Type of Home: House Home Access: Stairs to enter [employer] of Steps: 7 in front, 4 in the garage   Home Layout: One level               Home Equipment: Walker - 2 wheels;Walker - 4 wheels;Grab bars - tub/shower;Shower seat          Prior Functioning/Environment Level of Independence: Needs assistance    ADL's / Homemaking Assistance Needed: requires assist            OT Problem List: Decreased strength;Decreased range of motion;Pain;Impaired UE functional use;Decreased knowledge of use of DME or AE;Impaired vision/perception;Decreased activity tolerance      OT Treatment/Interventions: Self-care/ADL training;Therapeutic exercise;Patient/family education;Therapeutic activities;Neuromuscular education;DME and/or AE instruction    OT Goals(Current goals can be found in the care plan section) Acute Rehab OT Goals Patient Stated Goal: To go to rehab OT Goal Formulation: With patient Potential to Achieve Goals: Good  OT Frequency: Min 2X/week   Barriers to D/C:            Co-evaluation              AM-PAC OT "6 Clicks" Daily Activity     Outcome Measure Help from another person eating meals?: A Lot Help from another person taking care of personal grooming?: A Lot Help from another person toileting, which includes using toliet, bedpan, or urinal?: Total Help from another person bathing (including washing, rinsing, drying)?: Total Help from another person to put on and taking off regular upper body clothing?: Total Help from another person to put on and taking off regular lower body clothing?: Total 6 Click Score: 8   End of Session    Activity Tolerance: Patient tolerated treatment well Patient left: in bed;with call bell/phone within reach  OT Visit Diagnosis: Unsteadiness on feet (R26.81);Muscle weakness (generalized) (M62.81)                Time: 9528-4132 OT Time Calculation (min): 38 min Charges:  OT General Charges $OT Visit: 1  Visit OT Evaluation $OT Eval Moderate Complexity: 1 Mod  Olegario Messier, MS, OTR/L   Olegario Messier 01/23/2020, 12:16 PM

## 2020-01-23 NOTE — TOC Progression Note (Signed)
Transition of Care (TOC) - Progression Note    Patient Details  Name: Roy [name]. MRN: [id number] Date of Birth: [birth year]  Transition of Care [facility]) CM/SW Contact  Maree Krabbe, LCSW Phone Number: 01/23/2020, 3:32 PM  Clinical Narrative:   CSW received a call from [place], they are processing the appeal. They said they will have a determination by 10/15. MD notified.    Expected Discharge Plan: Skilled Nursing Facility Barriers to Discharge: Continued Medical Work up  Expected Discharge Plan and Services Expected Discharge Plan: Skilled Nursing Facility In-house Referral: Clinical Social Work Discharge Planning Services: NA Post Acute Care Choice: Skilled Nursing Facility Living arrangements for the past 2 months: Single Family Home                 DME Arranged: N/A DME Agency: NA       HH Arranged: NA HH Agency: NA         Social Determinants of Health (SDOH) Interventions    Readmission Risk Interventions No flowsheet data found.

## 2020-01-23 NOTE — Progress Notes (Addendum)
PROGRESS NOTE    Roy [name].  KGU:[id number] DOB: 07-Aug-1945 DOA: 01/14/2020 PCP: Rigoberto Noel, MD   Brief Narrative: Taken from H&P Roy [name] a 74 y.o.mw h/o DM, HTN,  presents to ED forevaluation of bilateral lower extremity weakness. Patient attempted to use the bathroom this morning but was so weak in his lower extremities that he could not stand from the toilet. He reports severe progressively worsening bilateral lower extremity weakness despite getting home physical therapy. Patient reports associated low back pain which radiates down the backs of both legs.  Per wife patient has progressively worsening lower extremity weakness and overall declined in his have for the past 1 year.  Up till few weeks ago he was able to walk around the house with walker which is becoming difficult now.  For the past couple of weeks he is spending most of the time in chair and having frequent falls. Patient has history of spinal stenosis and diabetic neuropathy involving both lower extremities. Neurosurgery was also consulted, surgery got postponed till next Tuesday as patient was on aspirin.  Discontinuing aspirin on 01/22/2020. Insurance is refusing SNF placement at this time asking for peer to peer review-tried calling twice and unable to reach anywhere.  Subjective: No pain. Tolerating diet. Was able to stand yesterday with PT.   Assessment & Plan:   Active Problems:   Lower extremity weakness   Weakness   Spinal stenosis of lumbar region   History of progressive weakness   Claudication (HCC)  Cervical myelopathy - 2/2 severe central canal stenosis at C3-4 MRI with severe c3/4 stenosis, also severe lumbar stenosis. B12 came back within normal limit, vitamin D low. Patient was initially going for C3/4 ACDF 10/12 which got canceled as he was on aspirin.  Plan is to have surgery next week, rescheduled for next Tuesday 10/19. per neurosurgery recommendation he can be  discharged to SNF and they can arrange surgery for next week with a new admission. -Continue with pain management. -continue vitamin D supplement. -Continue vitamin B12 supplement -Discontinue aspirin-discontinued on 01/22/2020. - wife has filed an appeal and says will hear back on 10/15. Per lcsw no peer to peer needed.  Urinary retention.   Patient with new urinary retention and inability to void.  Not on any BPH medication before. -Multiple I/O performed for retention before Foley inserted 01/19/20.Marland Kitchen  PLAN: -cont Foley for bladder decompression.  -continue Flomax (new) -Outpatient urology follow-up for voiding trial and further management. -Can trial voiding trial if patient stays inpatient till next week.  Elevated troponin.  Mildly elevated troponin with a flat curve.  No chest pain. Most likely secondary to demand.  Echocardiogram obtained with normal EF, grade 1 diastolic dysfunction and no regional wall motion abnormalities.  Type 2 diabetes mellitus.  CBG mildly elevated. Patient was on Metformin and Levemir at home.  A1c in June 2021 was 6.5 Repeat A1c yesterday was 5.8. PLAN: --continue Levemir 12u nightly --SSI  Hypertension.   Blood pressure within goal. -continue home coreg and amlodipine  Hyperlipidemia. -Continue home dose of Zocor  History of glaucoma. -Continue home eye drops  RLQ 2/2 stool impaction --Pt reported eating very well but having only watery stool with straining. KUB with full of stool on right.  Had a bowel movement with enema . -Continue with daily MiraLAX.  Objective: Vitals:   01/22/20 1625 01/22/20 1939 01/23/20 0420 01/23/20 0840  BP: (!) 153/80 (!) 147/74 131/81 113/80  Pulse: 60 62 62 65  Resp:  18     Temp: 97.8 F (36.6 C) 98 F (36.7 C) 97.8 F (36.6 C) 98 F (36.7 C)  TempSrc: Oral Oral Oral Oral  SpO2: 96% 100% 96% 98%  Weight:   116.4 kg   Height:        Intake/Output Summary (Last 24 hours) at 01/23/2020 1159 Last  data filed at 01/23/2020 0615 Gross per 24 hour  Intake 720 ml  Output 1650 ml  Net -930 ml   Filed Weights   01/21/20 0337 01/22/20 0458 01/23/20 0420  Weight: 117.6 kg 115.8 kg 116.4 kg    Examination:  General.  Well-developed gentleman, in no acute distress. Pulmonary.  Lungs clear bilaterally, normal respiratory effort. CV.  Regular rate and rhythm, no JVD, rub or murmur. Abdomen.  Soft, nontender, nondistended, BS positive. CNS.  Alert and oriented x3.  No focal neurologic deficit. Sensation intact LEs. Able to move them Extremities.  No edema, no cyanosis, pulses intact and symmetrical. Psychiatry.  Judgment and insight appears normal.  DVT prophylaxis: Lovenox Code Status: Full Family Communication: Wife was updated at bedside. Disposition Plan:  Status is: Inpatient  Remains inpatient appropriate because:Inpatient level of care appropriate due to severity of illness   Dispo: The patient is from: Home              Anticipated d/c is to: SNF              Anticipated d/c date is: 1-2 days .  Original plan was to send him to SNF for rehab and then come back for surgery next week. Insurance communication as above regarding SNF discharge   Consultants:   Neurosurgery  Procedures:  Antimicrobials:   Data Reviewed: I have personally reviewed following labs and imaging studies  CBC: Recent Labs  Lab 01/18/20 0447 01/20/20 0508 01/21/20 0501 01/22/20 0434  WBC 10.5 8.9 9.3 8.4  HGB 13.8 13.4 13.4 13.5  HCT 40.8 40.4 39.8 40.1  MCV 95.6 96.0 94.8 95.2  PLT 160 208 237 272   Basic Metabolic Panel: Recent Labs  Lab 01/18/20 0448 01/20/20 0508 01/21/20 0501 01/22/20 0434  NA 138 139 138 138  K 3.4* 3.6 3.9 3.8  CL 103 102 101 102  CO2 25 26 28 27   GLUCOSE 154* 188* 170* 175*  BUN 38* 38* 36* 29*  CREATININE 1.09 1.01 0.97 0.92  CALCIUM 9.3 9.4 9.3 9.3  MG  --  2.0 1.7 1.7   GFR: Estimated Creatinine Clearance: 98.2 mL/min (by C-G formula based on  SCr of 0.92 mg/dL). Liver Function Tests: No results for input(s): AST, ALT, ALKPHOS, BILITOT, PROT, ALBUMIN in the last 168 hours. No results for input(s): LIPASE, AMYLASE in the last 168 hours. No results for input(s): AMMONIA in the last 168 hours. Coagulation Profile: No results for input(s): INR, PROTIME in the last 168 hours. Cardiac Enzymes: No results for input(s): CKTOTAL, CKMB, CKMBINDEX, TROPONINI in the last 168 hours. BNP (last 3 results) No results for input(s): PROBNP in the last 8760 hours. HbA1C: No results for input(s): HGBA1C in the last 72 hours. CBG: Recent Labs  Lab 01/22/20 1202 01/22/20 1628 01/22/20 2052 01/23/20 0841 01/23/20 1148  GLUCAP 202* 207* 279* 176* 162*   Lipid Profile: No results for input(s): CHOL, HDL, LDLCALC, TRIG, CHOLHDL, LDLDIRECT in the last 72 hours. Thyroid Function Tests: No results for input(s): TSH, T4TOTAL, FREET4, T3FREE, THYROIDAB in the last 72 hours. Anemia Panel: No results for input(s): VITAMINB12, FOLATE, FERRITIN, TIBC, IRON,  RETICCTPCT in the last 72 hours. Sepsis Labs: No results for input(s): PROCALCITON, LATICACIDVEN in the last 168 hours.  Recent Results (from the past 240 hour(s))  Respiratory Panel by RT PCR (Flu A&B, Covid) - Nasopharyngeal Swab     Status: None   Collection Time: 01/14/20  8:52 PM   Specimen: Nasopharyngeal Swab  Result Value Ref Range Status   SARS Coronavirus 2 by RT PCR NEGATIVE NEGATIVE Final    Comment: (NOTE) SARS-CoV-2 target nucleic acids are NOT DETECTED.  The SARS-CoV-2 RNA is generally detectable in upper respiratoy specimens during the acute phase of infection. The lowest concentration of SARS-CoV-2 viral copies this assay can detect is 131 copies/mL. A negative result does not preclude SARS-Cov-2 infection and should not be used as the sole basis for treatment or other patient management decisions. A negative result may occur with  improper specimen collection/handling,  submission of specimen other than nasopharyngeal swab, presence of viral mutation(s) within the areas targeted by this assay, and inadequate number of viral copies (<131 copies/mL). A negative result must be combined with clinical observations, patient history, and epidemiological information. The expected result is Negative.  Fact Sheet for Patients:  https://www.moore.com/  Fact Sheet for Healthcare Providers:  https://www.young.biz/  This test is no t yet approved or cleared by the [place] FDA and  has been authorized for detection and/or diagnosis of SARS-CoV-2 by FDA under an Emergency Use Authorization (EUA). This EUA will remain  in effect (meaning this test can be used) for the duration of the COVID-19 declaration under Section 564(b)(1) of the Act, 21 U.S.C. section 360bbb-3(b)(1), unless the authorization is terminated or revoked sooner.     Influenza A by PCR NEGATIVE NEGATIVE Final   Influenza B by PCR NEGATIVE NEGATIVE Final    Comment: (NOTE) The Xpert Xpress SARS-CoV-2/FLU/RSV assay is intended as an aid in  the diagnosis of influenza from Nasopharyngeal swab specimens and  should not be used as a sole basis for treatment. Nasal washings and  aspirates are unacceptable for Xpert Xpress SARS-CoV-2/FLU/RSV  testing.  Fact Sheet for Patients: https://www.moore.com/  Fact Sheet for Healthcare Providers: https://www.young.biz/  This test is not yet approved or cleared by the [place] FDA and  has been authorized for detection and/or diagnosis of SARS-CoV-2 by  FDA under an Emergency Use Authorization (EUA). This EUA will remain  in effect (meaning this test can be used) for the duration of the  Covid-19 declaration under Section 564(b)(1) of the Act, 21  U.S.C. section 360bbb-3(b)(1), unless the authorization is  terminated or revoked. Performed at [facility], [address], [place], [place] [address]      Radiology Studies: No results found.  Scheduled Meds: . amLODipine  5 mg Oral Daily  . brimonidine  1 drop Right Eye BID  . carvedilol  6.25 mg Oral BID WC  . Chlorhexidine Gluconate Cloth  6 each Topical Daily  . cholecalciferol  1,000 Units Oral Daily  . enoxaparin (LOVENOX) injection  40 mg Subcutaneous Q24H  . insulin aspart  0-15 Units Subcutaneous TID WC  . insulin aspart  0-5 Units Subcutaneous QHS  . insulin detemir  12 Units Subcutaneous QHS  . latanoprost  1 drop Right Eye QHS  . multivitamin with minerals  1 tablet Oral Daily  . polyethylene glycol  17 g Oral Daily  . Ensure Max Protein  11 oz Oral TID  . simvastatin  20 mg Oral Daily  . tamsulosin  0.4  mg Oral QPC supper  . cyanocobalamin  2,000 mcg Oral Daily   Continuous Infusions:   LOS: 9 days    Silvano Bilis, MD [facility]  If 7PM-7AM, please contact night-coverage [email or web address]  01/23/2020, 11:59 AM

## 2020-01-24 DIAGNOSIS — M48062 Spinal stenosis, lumbar region with neurogenic claudication: Secondary | ICD-10-CM | POA: Diagnosis not present

## 2020-01-24 LAB — GLUCOSE, CAPILLARY
Glucose-Capillary: 195 mg/dL — ABNORMAL HIGH (ref 70–99)
Glucose-Capillary: 183 mg/dL — ABNORMAL HIGH (ref 70–99)
Glucose-Capillary: 281 mg/dL — ABNORMAL HIGH (ref 70–99)
Glucose-Capillary: 298 mg/dL — ABNORMAL HIGH (ref 70–99)

## 2020-01-24 NOTE — Progress Notes (Signed)
OT Cancellation Note  Patient Details Name: Roy [name]. MRN: [id number] DOB: [birth year]   Cancelled Treatment:    Reason Eval/Treat Not Completed: Patient at procedure or test/ unavailable  Pt on Bed pan attempting to have BM when OT checks on him for treatment. Will f/u next available date/time as able. Thank you.  Rejeana Brock, MS, OTR/L ascom [phone] 01/24/20, 4:00 PM

## 2020-01-24 NOTE — Progress Notes (Signed)
PROGRESS NOTE    Roy [name].  RSW:[id number] DOB: 03-31-1946 DOA: 01/14/2020 PCP: Rigoberto Noel, MD   Brief Narrative: Taken from H&P Roy [name] a 74 y.o.mw h/o DM, HTN,  presents to ED forevaluation of bilateral lower extremity weakness. Patient attempted to use the bathroom this morning but was so weak in his lower extremities that he could not stand from the toilet. He reports severe progressively worsening bilateral lower extremity weakness despite getting home physical therapy. Patient reports associated low back pain which radiates down the backs of both legs.  Per wife patient has progressively worsening lower extremity weakness and overall declined in his have for the past 1 year.  Up till few weeks ago he was able to walk around the house with walker which is becoming difficult now.  For the past couple of weeks he is spending most of the time in chair and having frequent falls. Patient has history of spinal stenosis and diabetic neuropathy involving both lower extremities. Neurosurgery was also consulted, surgery got postponed till next Tuesday as patient was on aspirin.  Discontinued aspirin on 01/22/2020. Insurance is refusing SNF placement at this time asking for peer to peer review-tried calling twice and unable to reach anywhere.  Subjective: No pain. Tolerating diet. Sat up in bed yesterday to eat, liked that.   Assessment & Plan:   Active Problems:   Lower extremity weakness   Weakness   Spinal stenosis of lumbar region   History of progressive weakness   Claudication (HCC)  Cervical myelopathy - 2/2 severe central canal stenosis at C3-4 MRI with severe c3/4 stenosis, also severe lumbar stenosis. B12 came back within normal limit, vitamin D low. Patient was initially going for C3/4 ACDF 10/12 which got canceled as he was on aspirin.  Plan is to have surgery next week, rescheduled for next Tuesday 10/19. per neurosurgery recommendation he can be  discharged to SNF and they can arrange surgery for next week with a new admission. -Continue with pain management. -continue vitamin D supplement. -Continue vitamin B12 supplement -Discontinue aspirin-discontinued on 01/22/2020. - wife has filed an appeal regarding SNF placement and says will hear back on 10/15. Per lcsw no peer to peer needed.  Urinary retention.   Patient with new urinary retention and inability to void.  Not on any BPH medication before. -Multiple I/O performed for retention before Foley inserted 01/19/20.Marland Kitchen  PLAN: -cont Foley for bladder decompression.  -continue Flomax (new) -Outpatient urology follow-up for voiding trial and further management. -Can trial voiding trial if patient stays inpatient till next week.  Elevated troponin.  Mildly elevated troponin with a flat curve.  No chest pain. Most likely secondary to demand.  Echocardiogram obtained with normal EF, grade 1 diastolic dysfunction and no regional wall motion abnormalities.  Type 2 diabetes mellitus.  CBG mildly elevated. Patient was on Metformin and Levemir at home.  A1c in June 2021 was 6.5 Repeat A1c this admission was 5.8. PLAN: --continue Levemir 12u nightly --SSI  Hypertension.   Blood pressure within goal. -continue home coreg and amlodipine  Hyperlipidemia. -Continue home dose of Zocor  History of glaucoma. -Continue home eye drops  RLQ 2/2 stool impaction --Pt reported eating very well but having only watery stool with straining. KUB with full of stool on right.  Had a bowel movement with enema. Stooling now improved. -Continue with daily MiraLAX.  Objective: Vitals:   01/23/20 1735 01/23/20 1929 01/24/20 0336 01/24/20 0801  BP:  138/77 (!) 148/83 (!) 148/84  Pulse: 100 73 62 65  Resp:  19 (!) 21   Temp:  98.1 F (36.7 C) 97.7 F (36.5 C) 97.6 F (36.4 C)  TempSrc:  Oral Oral Oral  SpO2:  94% 97% 97%  Weight:   116 kg   Height:        Intake/Output Summary (Last 24  hours) at 01/24/2020 1039 Last data filed at 01/24/2020 0930 Gross per 24 hour  Intake 270 ml  Output 1650 ml  Net -1380 ml   Filed Weights   01/22/20 0458 01/23/20 0420 01/24/20 0336  Weight: 115.8 kg 116.4 kg 116 kg    Examination:  General.  Well-developed gentleman, in no acute distress. Pulmonary.  Lungs clear bilaterally, normal respiratory effort. CV.  Regular rate and rhythm, no JVD, rub or murmur. Abdomen.  Soft, nontender, nondistended, BS positive. CNS.  Alert and oriented x3.  No focal neurologic deficit. Sensation intact LEs. Able to move them Extremities.  No edema, no cyanosis, pulses intact and symmetrical. Psychiatry.  Judgment and insight appears normal.  DVT prophylaxis: Lovenox Code Status: Full Family Communication: Wife was updated at bedside. Disposition Plan:  Status is: Inpatient  Remains inpatient appropriate because:Unsafe d/c plan   Dispo: The patient is from: Home              Anticipated d/c is to: SNF              Anticipated d/c date is: 1-2 days pending insurance approval for SNF   Consultants:   Neurosurgery  Procedures:  Antimicrobials:   Data Reviewed: I have personally reviewed following labs and imaging studies  CBC: Recent Labs  Lab 01/18/20 0447 01/20/20 0508 01/21/20 0501 01/22/20 0434  WBC 10.5 8.9 9.3 8.4  HGB 13.8 13.4 13.4 13.5  HCT 40.8 40.4 39.8 40.1  MCV 95.6 96.0 94.8 95.2  PLT 160 208 237 272   Basic Metabolic Panel: Recent Labs  Lab 01/18/20 0448 01/20/20 0508 01/21/20 0501 01/22/20 0434  NA 138 139 138 138  K 3.4* 3.6 3.9 3.8  CL 103 102 101 102  CO2 25 26 28 27   GLUCOSE 154* 188* 170* 175*  BUN 38* 38* 36* 29*  CREATININE 1.09 1.01 0.97 0.92  CALCIUM 9.3 9.4 9.3 9.3  MG  --  2.0 1.7 1.7   GFR: Estimated Creatinine Clearance: 98.1 mL/min (by C-G formula based on SCr of 0.92 mg/dL). Liver Function Tests: No results for input(s): AST, ALT, ALKPHOS, BILITOT, PROT, ALBUMIN in the last 168  hours. No results for input(s): LIPASE, AMYLASE in the last 168 hours. No results for input(s): AMMONIA in the last 168 hours. Coagulation Profile: No results for input(s): INR, PROTIME in the last 168 hours. Cardiac Enzymes: No results for input(s): CKTOTAL, CKMB, CKMBINDEX, TROPONINI in the last 168 hours. BNP (last 3 results) No results for input(s): PROBNP in the last 8760 hours. HbA1C: No results for input(s): HGBA1C in the last 72 hours. CBG: Recent Labs  Lab 01/23/20 0841 01/23/20 1148 01/23/20 1712 01/23/20 2101 01/24/20 0758  GLUCAP 176* 162* 255* 279* 195*   Lipid Profile: No results for input(s): CHOL, HDL, LDLCALC, TRIG, CHOLHDL, LDLDIRECT in the last 72 hours. Thyroid Function Tests: No results for input(s): TSH, T4TOTAL, FREET4, T3FREE, THYROIDAB in the last 72 hours. Anemia Panel: No results for input(s): VITAMINB12, FOLATE, FERRITIN, TIBC, IRON, RETICCTPCT in the last 72 hours. Sepsis Labs: No results for input(s): PROCALCITON, LATICACIDVEN in the last 168 hours.  Recent Results (from the  past 240 hour(s))  Respiratory Panel by RT PCR (Flu A&B, Covid) - Nasopharyngeal Swab     Status: None   Collection Time: 01/14/20  8:52 PM   Specimen: Nasopharyngeal Swab  Result Value Ref Range Status   SARS Coronavirus 2 by RT PCR NEGATIVE NEGATIVE Final    Comment: (NOTE) SARS-CoV-2 target nucleic acids are NOT DETECTED.  The SARS-CoV-2 RNA is generally detectable in upper respiratoy specimens during the acute phase of infection. The lowest concentration of SARS-CoV-2 viral copies this assay can detect is 131 copies/mL. A negative result does not preclude SARS-Cov-2 infection and should not be used as the sole basis for treatment or other patient management decisions. A negative result may occur with  improper specimen collection/handling, submission of specimen other than nasopharyngeal swab, presence of viral mutation(s) within the areas targeted by this assay,  and inadequate number of viral copies (<131 copies/mL). A negative result must be combined with clinical observations, patient history, and epidemiological information. The expected result is Negative.  Fact Sheet for Patients:  https://www.moore.com/  Fact Sheet for Healthcare Providers:  https://www.young.biz/  This test is no t yet approved or cleared by the [place] FDA and  has been authorized for detection and/or diagnosis of SARS-CoV-2 by FDA under an Emergency Use Authorization (EUA). This EUA will remain  in effect (meaning this test can be used) for the duration of the COVID-19 declaration under Section 564(b)(1) of the Act, 21 U.S.C. section 360bbb-3(b)(1), unless the authorization is terminated or revoked sooner.     Influenza A by PCR NEGATIVE NEGATIVE Final   Influenza B by PCR NEGATIVE NEGATIVE Final    Comment: (NOTE) The Xpert Xpress SARS-CoV-2/FLU/RSV assay is intended as an aid in  the diagnosis of influenza from Nasopharyngeal swab specimens and  should not be used as a sole basis for treatment. Nasal washings and  aspirates are unacceptable for Xpert Xpress SARS-CoV-2/FLU/RSV  testing.  Fact Sheet for Patients: https://www.moore.com/  Fact Sheet for Healthcare Providers: https://www.young.biz/  This test is not yet approved or cleared by the [place] FDA and  has been authorized for detection and/or diagnosis of SARS-CoV-2 by  FDA under an Emergency Use Authorization (EUA). This EUA will remain  in effect (meaning this test can be used) for the duration of the  Covid-19 declaration under Section 564(b)(1) of the Act, 21  U.S.C. section 360bbb-3(b)(1), unless the authorization is  terminated or revoked. Performed at [facility], [address] [place], [place] [address]      Radiology Studies: No results found.  Scheduled Meds: . amLODipine  5 mg  Oral Daily  . brimonidine  1 drop Right Eye BID  . carvedilol  6.25 mg Oral BID WC  . Chlorhexidine Gluconate Cloth  6 each Topical Daily  . cholecalciferol  1,000 Units Oral Daily  . enoxaparin (LOVENOX) injection  40 mg Subcutaneous Q24H  . insulin aspart  0-15 Units Subcutaneous TID WC  . insulin aspart  0-5 Units Subcutaneous QHS  . insulin detemir  12 Units Subcutaneous QHS  . latanoprost  1 drop Right Eye QHS  . multivitamin with minerals  1 tablet Oral Daily  . polyethylene glycol  17 g Oral Daily  . Ensure Max Protein  11 oz Oral Daily  . simvastatin  20 mg Oral Daily  . tamsulosin  0.4 mg Oral QPC supper  . cyanocobalamin  2,000 mcg Oral Daily   Continuous Infusions:   LOS: 10 days  Silvano Bilis, MD [facility]  If 7PM-7AM, please contact night-coverage [email or web address]  01/24/2020, 10:39 AM

## 2020-01-24 NOTE — Progress Notes (Signed)
Physical Therapy Treatment Patient Details Name: Roy [name]. MRN: [id number] DOB: 05-Aug-1945 Today's Date: 01/24/2020    History of Present Illness Pt. is a 74 y.o. male who was admitted to [facility] with BUE, and LE weakness after being unable to stand from his toilet. 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 PMHx includes: DM, HTN, Hyperlipidemia, glaucoma, and left eye blindness. Pt. is scheduled for ACDF C3-4 on 01/29/20.    PT Comments    Pt pleasant & cooperative but does require edu re: attempting difficult tasks to improve independence with mobility & for strengthening purposes. Pt ultimately requires +2 assist for bed mobility and is limited in use of BUE. Pt is able to stand from elevated EOB with blocking of BLE knees and cuing for technique. Pt with limited anterior pelvic shift and unable to achieve full upright posture despite PT attempting to assist with shifting pelvis forward. Pt is able to stand 3 times with max of ~10-15 seconds. Pt does endorse feeling "swimmy headed" sitting EOB but vitals stable, see below. Pt notes fatigue once he returned supine. Pt verbalizes & demos BLE supine exercises with PT educating him on need to perform exercises 3x/daily if possible.  Sitting EOB: BP = 124/86 mmHg (LUE) After standing attempts while sitting EOB: BP = 140/76 mmHg (LUE)    Follow Up Recommendations  SNF;Supervision/Assistance - 24 hour     Equipment Recommendations   (TBD in next venue)    Recommendations for Other Services       Precautions / Restrictions Precautions Precautions: Fall Required Braces or Orthoses: Other Brace Other Brace: pt has personal B knee braces he brought from home Restrictions Weight Bearing Restrictions: No Other Position/Activity Restrictions: B shoulder pain/weakness limting UE assist.    Mobility  Bed Mobility Overal bed mobility: Needs Assistance Bed Mobility: Supine to Sit;Sit to Supine     Supine to sit: Max assist;+2  for physical assistance Sit to supine: Max assist;+2 for physical assistance      Transfers Overall transfer level: Needs assistance Equipment used: Rolling walker (2 wheeled) Transfers: Sit to/from Stand Sit to Stand: +2 physical assistance;Max assist;+2 safety/equipment            Ambulation/Gait                 Stairs             Wheelchair Mobility    Modified Rankin (Stroke Patients Only)       Balance Overall balance assessment: Needs assistance Sitting-balance support: Bilateral upper extremity supported;Feet supported Sitting balance-Leahy Scale: Good Sitting balance - Comments: no LOB in sitting with BLEs supported on floor   Standing balance support: Bilateral upper extremity supported;During functional activity Standing balance-Leahy Scale: Poor Standing balance comment: +2 assist for standing balance with BUE support on RW, posterior lean                            Cognition Arousal/Alertness: Awake/alert Behavior During Therapy: WFL for tasks assessed/performed                                          Exercises      General Comments        Pertinent Vitals/Pain Pain Assessment: Faces Faces Pain Scale: Hurts a little bit Pain Location: "all over" Pain Descriptors / Indicators: Aching;Sore  Pain Intervention(s): Limited activity within patient's tolerance;Monitored during session    Home Living                      Prior Function            PT Goals (current goals can now be found in the care plan section) Acute Rehab PT Goals Patient Stated Goal: to get better PT Goal Formulation: With patient Time For Goal Achievement: 01/29/20 Potential to Achieve Goals: Fair Progress towards PT goals: Progressing toward goals    Frequency    Min 2X/week      PT Plan Current plan remains appropriate    Co-evaluation              AM-PAC PT "6 Clicks" Mobility   Outcome Measure   Help needed turning from your back to your side while in a flat bed without using bedrails?: A Lot Help needed moving from lying on your back to sitting on the side of a flat bed without using bedrails?: Total Help needed moving to and from a bed to a chair (including a wheelchair)?: Total Help needed standing up from a chair using your arms (e.g., wheelchair or bedside chair)?: Total Help needed to walk in hospital room?: Total Help needed climbing 3-5 steps with a railing? : Total 6 Click Score: 7    End of Session Equipment Utilized During Treatment: Gait belt Activity Tolerance: Patient tolerated treatment well Patient left: in bed;with call bell/phone within reach;with bed alarm set;with family/visitor present   PT Visit Diagnosis: Unsteadiness on feet (R26.81);Other abnormalities of gait and mobility (R26.89);Muscle weakness (generalized) (M62.81);Difficulty in walking, not elsewhere classified (R26.2);Repeated falls (R29.6)     Time: 1426-1500 PT Time Calculation (min) (ACUTE ONLY): 34 min  Charges:  $Therapeutic Activity: 23-37 mins                     Aleda Grana, PT, DPT 01/24/20, 4:17 PM

## 2020-01-24 NOTE — Care Management Important Message (Signed)
Important Message  Patient Details  Name: Roy [name]. MRN: [id number] Date of Birth: 1945/10/24   Medicare Important Message Given:  Yes     [name] 01/24/2020, 2:21 PM

## 2020-01-25 ENCOUNTER — Ambulatory Visit: Payer: Medicare HMO | Admitting: Cardiology

## 2020-01-25 ENCOUNTER — Other Ambulatory Visit: Payer: Self-pay | Admitting: Neurosurgery

## 2020-01-25 ENCOUNTER — Other Ambulatory Visit: Admit: 2020-01-25 | Payer: Medicare HMO

## 2020-01-25 DIAGNOSIS — M4802 Spinal stenosis, cervical region: Secondary | ICD-10-CM | POA: Diagnosis not present

## 2020-01-25 LAB — CBC
HCT: 40.4 % (ref 39.0–52.0)
Hemoglobin: 13.4 g/dL (ref 13.0–17.0)
MCH: 31.5 pg (ref 26.0–34.0)
MCHC: 33.2 g/dL (ref 30.0–36.0)
MCV: 94.8 fL (ref 80.0–100.0)
Platelets: 309 10*3/uL (ref 150–400)
RBC: 4.26 MIL/uL (ref 4.22–5.81)
RDW: 11.9 % (ref 11.5–15.5)
WBC: 7.8 10*3/uL (ref 4.0–10.5)
nRBC: 0 % (ref 0.0–0.2)

## 2020-01-25 LAB — GLUCOSE, CAPILLARY
Glucose-Capillary: 163 mg/dL — ABNORMAL HIGH (ref 70–99)
Glucose-Capillary: 231 mg/dL — ABNORMAL HIGH (ref 70–99)

## 2020-01-25 LAB — APTT: aPTT: 29 seconds (ref 24–36)

## 2020-01-25 LAB — RESPIRATORY PANEL BY RT PCR (FLU A&B, COVID)
Influenza A by PCR: NEGATIVE
Influenza B by PCR: NEGATIVE
SARS Coronavirus 2 by RT PCR: NEGATIVE

## 2020-01-25 LAB — PROTIME-INR
INR: 1 (ref 0.8–1.2)
Prothrombin Time: 12.4 seconds (ref 11.4–15.2)

## 2020-01-25 MED ORDER — TAMSULOSIN HCL 0.4 MG PO CAPS
0.4000 mg | ORAL_CAPSULE | Freq: Every day | ORAL | 1 refills | Status: DC
Start: 2020-01-25 — End: 2021-03-18

## 2020-01-25 NOTE — Progress Notes (Signed)
Occupational Therapy Treatment Patient Details Name: Roy [name]. MRN: [id number] DOB: September 11, 1945 Today's Date: 01/25/2020    History of present illness Pt. is a 74 y.o. male who was admitted to [facility] with BUE, and LE weakness after being unable to stand from his toilet. 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 PMHx includes: DM, HTN, Hyperlipidemia, glaucoma, and left eye blindness. Pt. is scheduled for ACDF C3-4 on 01/29/20.   OT comments  Pt seen for OT tx this date to f/u re: safety with ADLs/ADL mobility. OT facilitates Pt participation in sup to sit with MAX A +2, Pt demos G static sitting with alternating UEs to engage in UB ADLs. OT engages pt in oral care with MOD A with cues to look at hands to better engage in task 2/2 decreased sensation. Pt requires MOD A for oral care, MOD A to wash face with some hand/over hand assist initially, pt able to complete 50% with SETUP using both hands. Pt requires MAX A for UB bathing and MAX A to apply deodorant. Tolerates EOB sitting ~20 mins. OT engages pt in sit to sup with MAX A+2 to manage trunk and LEs. Pt repositioned in bed using MAX A to roll and MAX A with bed in trendelenburg to assist towards HOB. Left with HOB elevated, all needs met and in reach. Spouse present in room throughout. Pt with good efforts to participate in EOB sitting ADL tasks. Continue to anticipate that pt will require SNF upon d/c from acute setting. Will continue to follow.    Follow Up Recommendations  SNF    Equipment Recommendations       Recommendations for Other Services      Precautions / Restrictions Precautions Precautions: Fall Required Braces or Orthoses: Other Brace Restrictions Weight Bearing Restrictions: No Other Position/Activity Restrictions: B shoulder pain/weakness limting UE assist.       Mobility Bed Mobility Overal bed mobility: Needs Assistance Bed Mobility: Supine to Sit;Sit to Supine;Sidelying to Sit Rolling: Max assist    Supine to sit: Max assist;+2 for physical assistance Sit to supine: Max assist;+2 for physical assistance   General bed mobility comments: MAX A +2 to manage UEs/LEs.  Transfers                 General transfer comment: NT this date    Balance Overall balance assessment: Needs assistance Sitting-balance support: Feet supported;Single extremity supported Sitting balance-Leahy Scale: Good Sitting balance - Comments: alternates UE support to engagein UB self care       Standing balance comment: NT                           ADL either performed or assessed with clinical judgement   ADL Overall ADL's : Needs assistance/impaired     Grooming: Wash/dry face;Oral care;Moderate assistance;Applying deodorant;Maximal assistance;Sitting Grooming Details (indicate cue type and reason): sitting EOB, MOD A for washing face/brushing teeth and MAX A for applying deodorant. All secondary to poor grasp and decreased shoulder/wrist ROM needed to angle/reach. Upper Body Bathing: Maximal assistance;Sitting Upper Body Bathing Details (indicate cue type and reason): pt requires hand over hand assist to grip wash cloth/towel d/t weak grasp and limited shoulder/wrist ROM                                 Vision Baseline Vision/History: Glaucoma;Wears glasses Patient Visual Report: No change from  baseline (h/o L eye blindness)     Perception     Praxis      Cognition Arousal/Alertness: Awake/alert Behavior During Therapy: WFL for tasks assessed/performed Overall Cognitive Status: Within Functional Limits for tasks assessed                                 General Comments: Pt is A and O x 4 and agreeable and cooperative throughout        Exercises Other Exercises Other Exercises: OT facilitates Pt participation in sup to sit with MAX A +2, Pt demos G static sitting with alternating UEs to engage in UB ADLs. OT engages pt in oral care with MOD A  with cues to look at hands to better engage in task 2/2 decreased sensation. Pt requires MOD A for oral care, MOD A to wash face with some hand/over hand assist initially, pt able to complete 50% with SETUP using both hands. Pt requires MAX A for UB bathing and MAX A to apply deodorant. Tolerates EOB sitting ~20 mins. OT engages pt in sit to sup with MAX A+2 to manage trunk and LEs. Pt repositioned in bed using MAX A to roll and MAX A with bed in trendelenburg to assist towards HOB. Left with HOB elevated, all needs met and in reach. Spouse present in room throughout. Pt with good efforts to participate in EOB sitting ADL tasks.   Shoulder Instructions       General Comments      Pertinent Vitals/ Pain       Pain Assessment: Faces Faces Pain Scale: Hurts a little bit Pain Location: back Pain Descriptors / Indicators: Sore Pain Intervention(s): Monitored during session;Repositioned  Home Living Family/patient expects to be discharged to:: Skilled nursing facility Living Arrangements: Spouse/significant other Available Help at Discharge: Family Type of Home: House Home Access: Stairs to enter [employer] of Steps: 7 in front, 4 in the garage   Home Layout: One level               Home Equipment: [place] - 2 wheels;Walker - 4 wheels;Grab bars - tub/shower;Shower seat          Prior Functioning/Environment Level of Independence: Needs assistance  Gait / Transfers Assistance Needed: able to ambulate short distances with RW at home at baseline. ADL's / Homemaking Assistance Needed: requires assist       Frequency  Min 2X/week        Progress Toward Goals  OT Goals(current goals can now be found in the care plan section)  Progress towards OT goals: Progressing toward goals  Acute Rehab OT Goals Patient Stated Goal: to get better OT Goal Formulation: With patient Potential to Achieve Goals: Good  Plan Discharge plan remains appropriate     Co-evaluation                 AM-PAC OT "6 Clicks" Daily Activity     Outcome Measure   Help from another person eating meals?: A Lot Help from another person taking care of personal grooming?: A Lot Help from another person toileting, which includes using toliet, bedpan, or urinal?: Total Help from another person bathing (including washing, rinsing, drying)?: Total Help from another person to put on and taking off regular upper body clothing?: Total Help from another person to put on and taking off regular lower body clothing?: Total 6 Click Score: 8    End of  Session    OT Visit Diagnosis: Unsteadiness on feet (R26.81);Muscle weakness (generalized) (M62.81)   Activity Tolerance Patient tolerated treatment well   Patient Left in bed;with call bell/phone within reach;with bed alarm set;with family/visitor present   Nurse Communication Mobility status        Time: 2023-3435 OT Time Calculation (min): 32 min  Charges: OT General Charges $OT Visit: 1 Visit OT Treatments $Self Care/Home Management : 8-22 mins $Therapeutic Activity: 8-22 mins  Gerrianne Scale, MS, OTR/L ascom [phone] 01/25/20, 1:42 PM

## 2020-01-25 NOTE — TOC Transition Note (Signed)
Transition of Care [facility]) - CM/SW Discharge Note   Patient Details  Name: Agustine Rossitto. MRN: [id number] Date of Birth: 27-Apr-1945  [facility] CM/SW Contact:  Maree Krabbe, LCSW Phone Number: 01/25/2020, 1:01 PM   Clinical Narrative:   Clinical Social Worker facilitated patient discharge including contacting patient family and facility to confirm patient discharge plans.  Clinical information faxed to facility and family agreeable with plan.  CSW arranged ambulance transport via [facility] to [facility]. RN to call [phone] for report prior to discharge.    Final next level of care: Skilled Nursing Facility Barriers to Discharge: No Barriers Identified   Patient Goals and CMS Choice Patient states their goals for this hospitalization and ongoing recovery are:: SNF for rehab   Choice offered to / list presented to : NA  Discharge Placement              Patient chooses bed at:  ([facility]) Patient to be transferred to facility by: [facility] Name of family member notified: pt's spouse Patient and family notified of of transfer: 01/25/20  Discharge Plan and Services In-house Referral: Clinical Social Work Discharge Planning Services: NA Post Acute Care Choice: Skilled Nursing Facility          DME Arranged: N/A DME Agency: NA       HH Arranged: NA HH Agency: NA        Social Determinants of Health (SDOH) Interventions     Readmission Risk Interventions No flowsheet data found.

## 2020-01-25 NOTE — Plan of Care (Signed)
Pt dc to rehab facility in [place], report call to the receiving nurse Jody. AVS printed and placed inside the package. IV catheter dc. Pt is going to the facility with the foley for retention.    Problem: Education: Goal: Knowledge of General Education information will improve Description: Including pain rating scale, medication(s)/side effects and non-pharmacologic comfort measures Outcome: Adequate for Discharge   Problem: Health Behavior/Discharge Planning: Goal: Ability to manage health-related needs will improve Outcome: Adequate for Discharge   Problem: Clinical Measurements: Goal: Ability to maintain clinical measurements within normal limits will improve Outcome: Adequate for Discharge Goal: Will remain free from infection Outcome: Adequate for Discharge Goal: Diagnostic test results will improve Outcome: Adequate for Discharge Goal: Respiratory complications will improve Outcome: Adequate for Discharge Goal: Cardiovascular complication will be avoided Outcome: Adequate for Discharge   Problem: Activity: Goal: Risk for activity intolerance will decrease Outcome: Adequate for Discharge   Problem: Nutrition: Goal: Adequate nutrition will be maintained Outcome: Adequate for Discharge   Problem: Coping: Goal: Level of anxiety will decrease Outcome: Adequate for Discharge   Problem: Elimination: Goal: Will not experience complications related to bowel motility Outcome: Adequate for Discharge Goal: Will not experience complications related to urinary retention Outcome: Adequate for Discharge   Problem: Pain Managment: Goal: General experience of comfort will improve Outcome: Adequate for Discharge   Problem: Safety: Goal: Ability to remain free from injury will improve Outcome: Adequate for Discharge   Problem: Skin Integrity: Goal: Risk for impaired skin integrity will decrease Outcome: Adequate for Discharge

## 2020-01-25 NOTE — Discharge Summary (Signed)
Roy [name]. UJW:[id number] DOB: March 29, 1946 DOA: 01/14/2020  PCP: Roy Noel, MD  Admit date: 01/14/2020 Discharge date: 01/25/2020  Time spent: 35 min minutes  Recommendations for Outpatient Follow-up:  1. Has c-spine surgery scheduled for 10/19  2. Discharged with Foley catheter in place for urinary retention, will need outpatient urology follow-up (referral made)    Discharge Diagnoses:  Active Problems:   Lower extremity weakness   Weakness   Spinal stenosis of lumbar region   History of progressive weakness   Claudication Roy [name])   Discharge Condition: stable  Diet recommendation: regular  Filed Weights   01/23/20 0420 01/24/20 0336 01/25/20 0331  Weight: 116.4 kg 116 kg 115.5 kg    History of present illness:  [name] a 74 y.o.mw h/o DM, HTN,presents to EDforevaluationof bilateral lower extremity weakness. Patient attempted to use the bathroom this morning but was so weak in his lower extremities that he could not stand from the toilet. He reports severe progressively worsening bilateral lower extremity weakness despite getting home physical therapy. Patient reports associated low back pain which radiates down the backs of both legs.  Per wife patient has progressively worsening lower extremity weakness and overall declined in his have for the past 1 year.  Up till few weeks ago he was able to walk around the house with walker which is becoming difficult now.  For the past couple of weeks he is spending most of the time in chair and having frequent falls. Patient has history of spinal stenosis and diabetic neuropathy involving both lower extremities. Neurosurgery was also consulted, surgery got postponed till next Tuesday as patient was on aspirin.  Discontinued aspirin on 01/22/2020. Insurance is refusing SNF placement at this time asking for peer to peer review-tried calling twice and unable to reach anywhere.  Hospital Course:  Cervical  myelopathy - 2/2 severe central canal stenosis at C3-4 MRI with severe c3/4 stenosis, also severe lumbar stenosis. B12 came back within normal limit, vitamin D low. Patient was initially going for C3/4 ACDF 10/12 which got canceled as he was on aspirin.  Plan is to have surgery next week, rescheduled for next Tuesday 10/19. per neurosurgery recommendation he can be discharged to SNF. Pre-op will call family with instructions. Discussed w/ Dr. Teola Bradley day of discharge, appears all pre-op testing is completed save for coags, which I've ordered -Discontinued aspirin-discontinued on 01/22/2020.  Urinary retention.   Patient with new urinary retention and inability to void.  Not on any BPH medication before. -Multiple I/O performed for retention before Foley inserted 01/19/20.Marland Kitchen  PLAN: -cont Foley for bladder decompression.  -continue Flomax (new) -Outpatient urology follow-up for voiding trial and further management (referral placed)  Elevated troponin.  Mildly elevated troponin with a flat curve.  No chest pain. Most likely secondary to demand.  Echocardiogram obtained with normal EF, grade 1 diastolic dysfunction and no regional wall motion abnormalities.  Type 2 diabetes mellitus.  CBG mildly elevated. Patient was on Metformin and Levemir at home.  A1c in June 2021 was 6.5 Repeat A1c this admission was 5.8. Will resume home meds at d/c  Hypertension.   Blood pressure within goal. -continued home coreg and amlodipine  Hyperlipidemia. -Continued home dose of Zocor  History of glaucoma. -Continue home eye drops  RLQ 2/2 stool impaction --Pt reported eating very well but having only watery stool with straining. KUB with full of stool on right.  Had a bowel movement with enema. Stooling now improved. -Continued with daily MiraLAX.  Procedures:  none   Consultations:  neurosurgery  Discharge Exam: Vitals:   01/25/20 0421 01/25/20 0747  BP: 128/80 123/70  Pulse: (!) 59 (!)  55  Resp: 18 20  Temp: 97.6 F (36.4 C) 97.8 F (36.6 C)  SpO2: 97% 96%    General.  Well-developed gentleman, in no acute distress. Pulmonary.  Lungs clear bilaterally, normal respiratory effort. CV.  Regular rate and rhythm, no JVD, rub or murmur. Abdomen.  Soft, nontender, nondistended, BS positive. CNS.  Alert and oriented x3.  No focal neurologic deficit. Sensation intact LEs. Able to move them Extremities.  No edema, no cyanosis, pulses intact and symmetrical. Psychiatry.  Judgment and insight appears normal.  Discharge Instructions   Discharge Instructions    Ambulatory referral to Urology   Complete by: As directed    Urinary retention   Call MD for:  difficulty breathing, headache or visual disturbances   Complete by: As directed    Call MD for:  extreme fatigue   Complete by: As directed    Call MD for:  persistant dizziness or light-headedness   Complete by: As directed    Call MD for:  persistant nausea and vomiting   Complete by: As directed    Call MD for:  redness, tenderness, or signs of infection (pain, swelling, redness, odor or green/yellow discharge around incision site)   Complete by: As directed    Call MD for:  severe uncontrolled pain   Complete by: As directed    Call MD for:  temperature >100.4   Complete by: As directed    Diet general   Complete by: As directed    Increase activity slowly   Complete by: As directed    No wound care   Complete by: As directed      Allergies as of 01/25/2020   No Known Allergies     Medication List    STOP taking these medications   aspirin 81 MG EC tablet     TAKE these medications   amLODipine 5 MG tablet Commonly known as: NORVASC Take 5 mg by mouth daily.   brimonidine 0.2 % ophthalmic solution Commonly known as: ALPHAGAN Place 1 drop into the right eye in the morning and at bedtime.   carvedilol 6.25 MG tablet Commonly known as: COREG Take 6.25 mg by mouth 2 (two) times daily.    CENTRUM SILVER 50+MEN PO Take 1 tablet by mouth daily.   cyanocobalamin 2000 MCG tablet Take 2,000 mcg by mouth daily.   famotidine 20 MG tablet Commonly known as: PEPCID Take 20 mg by mouth at bedtime as needed for heartburn.   insulin aspart 100 UNIT/ML injection Commonly known as: novoLOG Inject 4-8 Units into the skin 3 (three) times daily with meals. Sliding scale   latanoprost 0.005 % ophthalmic solution Commonly known as: XALATAN Place 1 drop into the right eye at bedtime.   Levemir FlexTouch 100 UNIT/ML FlexPen Generic drug: insulin detemir Inject 12 Units into the skin at bedtime.   melatonin 5 MG Tabs Take 5-10 mg by mouth at bedtime as needed.   meloxicam 7.5 MG tablet Commonly known as: MOBIC Take 7.5 mg by mouth daily.   metFORMIN 500 MG 24 hr tablet Commonly known as: GLUCOPHAGE-XR Take 500 mg by mouth 2 (two) times daily.   polyethylene glycol powder 17 GM/SCOOP powder Commonly known as: GLYCOLAX/MIRALAX Take 17 g by mouth in the morning.   simvastatin 20 MG tablet Commonly known as: ZOCOR Take 20  mg by mouth daily.   tamsulosin 0.4 MG Caps capsule Commonly known as: FLOMAX Take 1 capsule (0.4 mg total) by mouth daily after supper.   traZODone 50 MG tablet Commonly known as: DESYREL Take 25 mg by mouth at bedtime as needed for sleep.      No Known Allergies  Follow-up Information    [facility] Follow up.   Specialty: Urology Why: A referral has been made to this clinic, but I encourage you to call to schedule an appointment Contact information: [address], [address] [place] [address] [phone]               The results of significant diagnostics from this hospitalization (including imaging, microbiology, ancillary and laboratory) are listed below for reference.    Significant Diagnostic Studies: DG Abd 1 View  Result Date: 01/20/2020 CLINICAL DATA:  Right lower quadrant pain  EXAM: ABDOMEN - 1 VIEW COMPARISON:  None. FINDINGS: There is nonspecific gaseous distention throughout the majority of the colon. There is a large amount of stool in the right hemicolon. There is no pneumatosis or free air. No evidence for small bowel obstruction. Advanced multilevel degenerative changes are noted throughout the lumbar spine. There are degenerative changes of both hips. No convincing radiopaque kidney stone. IMPRESSION: Nonspecific gaseous distention throughout the majority of the colon. Large amount of stool in the right hemicolon. No evidence for small bowel obstruction. Electronically Signed   By: Katherine Mantle M.D.   On: 01/20/2020 19:32   CT Head Wo Contrast  Result Date: 01/14/2020 CLINICAL DATA:  Head trauma EXAM: CT HEAD WITHOUT CONTRAST TECHNIQUE: Contiguous axial images were obtained from the base of the skull through the vertex without intravenous contrast. COMPARISON:  None. FINDINGS: Brain: No acute infarct or intracranial hemorrhage. No mass lesion. No midline shift, ventriculomegaly or extra-axial fluid collection. Mild chronic microvascular ischemic changes. Cerebral volume is within normal limits for patient's age. Vascular: No hyperdense vessel or unexpected calcification. Bilateral skull base atherosclerotic calcifications. Skull: Negative for fracture or focal lesion. Sinuses/Orbits: Normal orbits. Clear paranasal sinuses. No mastoid effusion. Other: None. IMPRESSION: No acute intracranial process. Mild chronic microvascular ischemic changes. Electronically Signed   By: Stana Bunting M.D.   On: 01/14/2020 12:18   MR CERVICAL SPINE WO CONTRAST  Result Date: 01/19/2020 CLINICAL DATA:  Myelopathy, acute or progressive. Bilateral lower extremity weakness. Unable to stand from the toilet. EXAM: MRI CERVICAL SPINE WITHOUT CONTRAST TECHNIQUE: Multiplanar, multisequence MR imaging of the cervical spine was performed. No intravenous contrast was administered.  COMPARISON:  None. FINDINGS: Alignment: No significant listhesis is present. Straightening of the normal lumbar lordosis is noted. Vertebrae: Edematous endplate marrow changes are present C7-T1. Chronic changes are present at C5-6 and C6-7. Vertebral body heights are maintained. Marrow signal is otherwise normal. Cord: Focal T2 signal hyperintensity is present at C3-4. Cord signal and morphology is otherwise normal. Posterior Fossa, vertebral arteries, paraspinal tissues: Craniocervical junction is normal. Vertebral arteries are patent. Disc levels: C2-3: Broad-based disc osteophyte complex present. Uncovertebral spurring and facet disease contribute to moderate right and mild left foraminal stenosis. Central canal is patent. C3-4: A broad-based disc osteophyte complex present. Vance facet hypertrophy is worse on left. This results in severe central canal stenosis. The canal is narrowed to 4 mm. Severe left and moderate right foraminal stenosis is present. C4-5: A broad-based disc osteophyte complex partially effaces the ventral CSF. Uncovertebral and facet hypertrophy result in moderate right and mild left foraminal  stenosis. C5-6: Uncovertebral and facet disease is worse on the right. Severe right and moderate left foraminal stenosis is present. The central canal is patent. C6-7: Uncovertebral and facet spurring is present bilaterally. Moderate bilateral foraminal narrowing is present. The central canal is patent. C7-T1: Uncovertebral spurring is present bilaterally. Mild facet hypertrophy is noted bilaterally. Central canal is patent. Moderate foraminal narrowing is evident bilaterally. IMPRESSION: 1. 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 The canal is narrowed to 4 mm. Focal T2 cord signal abnormality is present at this level, most consistent with edema. 2. Moderate right and mild left foraminal stenosis at C2-3. 3. Moderate right and mild left foraminal stenosis at C4-5. 4. Severe right and moderate left  foraminal stenosis at C5-6. 5. Moderate bilateral foraminal narrowing at C6-7 and C7-T1. Electronically Signed   By: Marin Roberts M.D.   On: 01/19/2020 19:50   MR THORACIC SPINE WO CONTRAST  Result Date: 01/15/2020 CLINICAL DATA:  Acute presentation with bilateral lower extremity weakness. Myelopathy. EXAM: MRI THORACIC SPINE WITHOUT CONTRAST TECHNIQUE: Multiplanar, multisequence MR imaging of the thoracic spine was performed. No intravenous contrast was administered. COMPARISON:  Lumbar study done yesterday. FINDINGS: Alignment: No significant thoracic malalignment. Mild scoliotic curvature. Vertebrae: Chronic fusion at T8 and T9. No evidence of regional fracture or acute bone pathology. Cord: No primary cord lesion. See below regarding degenerative findings. Paraspinal and other soft tissues: Negative Disc levels: There is cervical spondylosis, not studied in any complete fashion. C7-T1: Endplate osteophytes and bulging of the disc. Narrowing of the ventral subarachnoid space but no compressive canal stenosis. Bilateral bony foraminal stenosis. T1-2: Mild disc bulge and facet hypertrophy. No compressive stenosis. T2-3: Mild disc bulge and facet hypertrophy. No compressive stenosis. T3-4: Disc bulge and endplate osteophytes more prominent towards the right. Narrowing of the ventral subarachnoid space on the right but no cord compression. Bilateral facet degeneration. Mild right foraminal stenosis. T4-5: Endplate osteophytes and bulging of the disc. No compressive canal stenosis. Foramina sufficiently patent. T5-6: Disc herniation in both the right posterolateral and left posterolateral direction, more prominent towards the left. Effacement of the ventral subarachnoid space and some deformity/flattening of the ventral cord. Subarachnoid space remains present dorsal to the cord however. Bilateral foraminal stenosis. Findings at this level could be symptomatic. T6-7: Disc bulge.  No compressive canal or  foraminal stenosis. T7-8: Disc bulge.  No compressive canal or foraminal stenosis per T8-9: Chronic fusion.  Sufficient patency of the canal and foramina. T9-10: Mild bulging of the disc. Bilateral facet degeneration and hypertrophy. Mild canal stenosis without cord compression or deformity. Bilateral foraminal narrowing. T10-11: Bulging of the disc. Bilateral facet degeneration and hypertrophy. Mild canal narrowing but no compression of the cord. Bilateral foraminal stenosis left worse than right. Either T10 nerve could be affected, particularly the left. T11-12: Bulging of the disc towards the left. Pronounced facet arthropathy. Canal narrowing without cord compression. Bilateral foraminal stenosis left more than right. Either T11 nerve could be affected, particularly the left. T12-L1: Endplate osteophytes and bulging of the disc. No compressive canal or foraminal stenosis. IMPRESSION: 1. No acute fracture in the thoracic region. Chronic fusion at T8 and T9. 2. Degenerative changes throughout the thoracic region as outlined above. At T5-6, there are bilateral posterolateral disc herniations more prominent towards the left. Effacement of the ventral subarachnoid space with mild cord deformity. Subarachnoid space remains present dorsal to the cord however. Bilateral foraminal stenosis. No other level shows any deformation of the cord. 3. At T9-10, there is  facet arthropathy with bilateral foraminal stenosis. 4. At T10-11, there is bulging of the disc and facet arthropathy with foraminal stenosis left worse than right. 5. At T11-12, there is bulging of the disc and facet arthropathy with foraminal stenosis left worse than right. Electronically Signed   By: Paulina Fusi M.D.   On: 01/15/2020 10:04   MR LUMBAR SPINE WO CONTRAST  Result Date: 01/14/2020 CLINICAL DATA:  Initial evaluation for low back pain with acute tingling in lower spine, weakness. EXAM: MRI LUMBAR SPINE WITHOUT CONTRAST TECHNIQUE: Multiplanar,  multisequence MR imaging of the lumbar spine was performed. No intravenous contrast was administered. COMPARISON:  None available. FINDINGS: Segmentation: Standard. Lowest well-formed disc space labeled the L5-S1 level. Alignment: Dextroscoliosis with apex at L2-3. Trace 2 mm anterolisthesis of L5 on S1, chronic and facet mediated. Vertebrae: Vertebral body height maintained without acute or chronic fracture. L2 and L3 vertebral bodies are partially ankylosed anteriorly, degenerative in nature. Bone marrow signal intensity diffusely heterogeneous without discrete or worrisome osseous lesions. No abnormal marrow edema. Conus medullaris and cauda equina: Conus extends to the L1 level. Conus and cauda equina appear normal. Paraspinal and other soft tissues: Paraspinous soft tissues demonstrate no acute finding. Disc levels: T12-L1: Disc desiccation with mild disc bulge. Prominent anterior endplate osteophytic spurring. Superimposed tiny right subarticular disc protrusion mildly indents the right ventral thecal sac. Mild facet hypertrophy. No significant canal or foraminal stenosis. L1-2: Diffuse disc bulge with disc desiccation and intervertebral disc space narrowing. Prominent reactive endplate osteophytic spurring, most pronounced anteriorly and to the right. Disc bulge slightly asymmetric to the right. Mild facet hypertrophy. Mild narrowing of the right lateral recess without significant spinal stenosis. Foramina remain patent. L2-3: Severe degenerative intervertebral disc space narrowing with partial ankylosis of the L2 and L3 vertebral bodies. Associated pronounced circumferential reactive endplate osteophytic spurring and diffuse disc bulge. Moderate bilateral facet hypertrophy. Resultant severe spinal stenosis. Thecal sac measures 7-8 mm in AP diameter at its most narrow point. Moderate left L2 foraminal narrowing. Right neural foramen remains patent. L3-4: Degenerative intervertebral disc space narrowing with  diffuse disc bulge and disc desiccation. Prominent reactive endplate osteophytic spurring. Moderate to severe bilateral facet hypertrophy, left worse than right. Cystic degeneration at the left ligamentum flavum noted. Resultant severe spinal stenosis with near effacement of the thecal sac. Thecal sac measures 4-5 mm in AP diameter at its most narrow point. Mild-to-moderate bilateral L3 foraminal narrowing, right slightly worse than left. L4-5: Mild diffuse disc bulge with disc desiccation. Prominent reactive endplate osteophytic spurring. Moderate facet and ligament flavum hypertrophy. Mild prominence of the dorsal epidural fat. Resultant moderate canal with severe bilateral subarticular stenosis. Severe left with moderate right L4 foraminal narrowing. L5-S1: Trace anterolisthesis. Mild disc bulge with disc desiccation. Endplate osteophytic spurring. Severe bilateral facet arthrosis. Complex synovial cyst measuring 12 x 10 x 21 mm seen at the medial aspect of the right L5-S1 facet, encroaching upon the right lateral recess (series 12, image 33). Resultant moderate right lateral recess stenosis with displacement of the descending right S2 nerve root. Thecal sac remains patent. Initial 12 mm synovial cyst seen at the anterior aspect of the right L5-S1 facet at the level of the right neural foramen (series 12, image 30). Resultant mild left with moderate right L5 foraminal stenosis. IMPRESSION: 1. Multifactorial degenerative changes at L2-3 through L4-5 with resultant severe canal with bilateral subarticular stenosis as above, most pronounced at L3-4. 2. Two separate synovial cysts measuring up to 21 mm about the  right L5-S1 facet with resultant moderate right foraminal and right lateral recess stenosis as above. 3. Moderate to severe bilateral L2 through L5 foraminal stenosis as detailed above. Electronically Signed   By: Rise Mu M.D.   On: 01/14/2020 19:42   US ARTERIAL ABI (SCREENING LOWER  EXTREMITY)  Result Date: 01/17/2020 CLINICAL DATA:  Claudication.  Diabetes, hypertension, obesity EXAM: NONINVASIVE PHYSIOLOGIC VASCULAR STUDY OF BILATERAL LOWER EXTREMITIES TECHNIQUE: Evaluation of both lower extremities were performed at rest, including calculation of ankle-brachial indices with single level Doppler, pressure recording. COMPARISON:  None. FINDINGS: Right ABI:  1.17 Left ABI:  1.02 Right Lower Extremity: Normal arterial waveforms at the ankle. A nonspecific cardiac arrhythmia is noted. Left Lower Extremity:  Normal arterial waveforms at the ankle. IMPRESSION: No evidence of hemodynamically significant lower extremity arterial occlusive disease at rest. Electronically Signed   By: Corlis Leak M.D.   On: 01/17/2020 08:04   DG Chest Portable 1 View  Result Date: 01/14/2020 CLINICAL DATA:  Weakness. EXAM: PORTABLE CHEST 1 VIEW COMPARISON:  None. FINDINGS: Upper normal heart size. Normal mediastinal contours. Coronary artery calcifications versus stent. Streaky and bandlike opacities at the left lung base. No confluent consolidation. No pulmonary edema, pneumothorax, or large pleural effusion. No acute osseous abnormalities are seen. IMPRESSION: Streaky and bandlike opacities at the left lung base, favor atelectasis. Electronically Signed   By: Narda Rutherford M.D.   On: 01/14/2020 18:36   ECHOCARDIOGRAM COMPLETE  Result Date: 01/15/2020    ECHOCARDIOGRAM REPORT   Patient Name:   Izzak Fries. Date of Exam: 01/15/2020 Medical Rec #:  [id number]             Height:       75.0 in Accession #:    [id number]            Weight:       252.0 lb Date of Birth:  1945/04/20             BSA:          2.421 m Patient Age:    74 years              BP:           160/92 mmHg Patient Gender: M                     HR:           71 bpm. Exam Location:  [facility] Procedure: 2D Echo, Color Doppler, Cardiac Doppler and Intracardiac            Opacification Agent Indications:     Elevated troponin  History:          Patient has no prior history of Echocardiogram examinations.                  Risk Factors:Hypertension and Diabetes.  Sonographer:     Humphrey Rolls RDCS (AE) Referring Phys:  [id number] Kendell Bane Diagnosing Phys: Cristal Deer End MD  Sonographer Comments: Suboptimal apical window and suboptimal subcostal window. Image acquisition challenging due to patient body habitus. IMPRESSIONS  1. Left ventricular ejection fraction, by estimation, is 60 to 65%. The left ventricle has normal function. The left ventricle has no regional wall motion abnormalities. There is mild left ventricular hypertrophy. Left ventricular diastolic parameters are consistent with Grade I diastolic dysfunction (impaired relaxation).  2. Right ventricular systolic function is normal. The right ventricular size is normal.  3. The mitral  valve is normal in structure. Trivial mitral valve regurgitation. No evidence of mitral stenosis.  4. The aortic valve was not well visualized. Aortic valve regurgitation is not visualized. No aortic stenosis is present.  5. Aortic dilatation noted. There is borderline dilatation of the aortic root, measuring 38 mm.  6. The inferior vena cava is normal in size with greater than 50% respiratory variability, suggesting right atrial pressure of 3 mmHg. FINDINGS  Left Ventricle: Left ventricular ejection fraction, by estimation, is 60 to 65%. The left ventricle has normal function. The left ventricle has no regional wall motion abnormalities. Definity contrast agent was given IV to delineate the left ventricular  endocardial borders. The left ventricular internal cavity size was normal in size. There is mild left ventricular hypertrophy. Left ventricular diastolic parameters are consistent with Grade I diastolic dysfunction (impaired relaxation). Right Ventricle: The right ventricular size is normal. No increase in right ventricular wall thickness. Right ventricular systolic function is normal. Left Atrium: Left  atrial size was normal in size. Right Atrium: Right atrial size was not well visualized. Pericardium: There is no evidence of pericardial effusion. Mitral Valve: The mitral valve is normal in structure. Trivial mitral valve regurgitation. No evidence of mitral valve stenosis. MV peak gradient, 3.5 mmHg. The mean mitral valve gradient is 1.0 mmHg. Tricuspid Valve: The tricuspid valve is not well visualized. Tricuspid valve regurgitation is not demonstrated. Aortic Valve: The aortic valve was not well visualized. Aortic valve regurgitation is not visualized. No aortic stenosis is present. Aortic valve mean gradient measures 4.0 mmHg. Aortic valve peak gradient measures 8.0 mmHg. Aortic valve area, by VTI measures 1.95 cm. Pulmonic Valve: The pulmonic valve was not well visualized. Pulmonic valve regurgitation is not visualized. No evidence of pulmonic stenosis. Aorta: Aortic dilatation noted. There is borderline dilatation of the aortic root, measuring 38 mm. Pulmonary Artery: The pulmonary artery is not well seen. Venous: The inferior vena cava is normal in size with greater than 50% respiratory variability, suggesting right atrial pressure of 3 mmHg. IAS/Shunts: The interatrial septum was not well visualized.  LEFT VENTRICLE PLAX 2D LVIDd:         5.44 cm  Diastology LVIDs:         4.35 cm  LV e' medial:    5.11 cm/s LV PW:         1.25 cm  LV E/e' medial:  10.2 LV IVS:        1.38 cm  LV e' lateral:   3.81 cm/s LVOT diam:     2.20 cm  LV E/e' lateral: 13.6 LV SV:         49 LV SV Index:   20 LVOT Area:     3.80 cm  LEFT ATRIUM             Index LA diam:        4.60 cm 1.90 cm/m LA Vol (A2C):   57.0 ml 23.55 ml/m LA Vol (A4C):   54.2 ml 22.39 ml/m LA Biplane Vol: 56.5 ml 23.34 ml/m  AORTIC VALVE                   PULMONIC VALVE AV Area (Vmax):    2.23 cm    PV Vmax:       0.93 m/s AV Area (Vmean):   2.00 cm    PV Vmean:      61.500 cm/s AV Area (VTI):     1.95 cm    PV VTI:  0.181 m AV Vmax:            141.00 cm/s PV Peak grad:  3.5 mmHg AV Vmean:          97.700 cm/s PV Mean grad:  2.0 mmHg AV VTI:            0.254 m AV Peak Grad:      8.0 mmHg AV Mean Grad:      4.0 mmHg LVOT Vmax:         82.80 cm/s LVOT Vmean:        51.500 cm/s LVOT VTI:          0.130 m LVOT/AV VTI ratio: 0.51  AORTA Ao Root diam: 3.80 cm MITRAL VALVE MV Area (PHT): 6.96 cm    SHUNTS MV Peak grad:  3.5 mmHg    Systemic VTI:  0.13 m MV Mean grad:  1.0 mmHg    Systemic Diam: 2.20 cm MV Vmax:       0.93 m/s MV Vmean:      55.6 cm/s MV Decel Time: 109 msec MV E velocity: 51.90 cm/s MV A velocity: 71.80 cm/s MV E/A ratio:  0.72 Cristal Deer End MD Electronically signed by Yvonne Kendall MD Signature Date/Time: 01/15/2020/12:29:39 PM    Final     Microbiology: No results found for this or any previous visit (from the past 240 hour(s)).   Labs: Basic Metabolic Panel: Recent Labs  Lab 01/20/20 0508 01/21/20 0501 01/22/20 0434  NA 139 138 138  K 3.6 3.9 3.8  CL 102 101 102  CO2 26 28 27   GLUCOSE 188* 170* 175*  BUN 38* 36* 29*  CREATININE 1.01 0.97 0.92  CALCIUM 9.4 9.3 9.3  MG 2.0 1.7 1.7   Liver Function Tests: No results for input(s): AST, ALT, ALKPHOS, BILITOT, PROT, ALBUMIN in the last 168 hours. No results for input(s): LIPASE, AMYLASE in the last 168 hours. No results for input(s): AMMONIA in the last 168 hours. CBC: Recent Labs  Lab 01/20/20 0508 01/21/20 0501 01/22/20 0434 01/25/20 0527  WBC 8.9 9.3 8.4 7.8  HGB 13.4 13.4 13.5 13.4  HCT 40.4 39.8 40.1 40.4  MCV 96.0 94.8 95.2 94.8  PLT 208 237 272 309   Cardiac Enzymes: No results for input(s): CKTOTAL, CKMB, CKMBINDEX, TROPONINI in the last 168 hours. BNP: BNP (last 3 results) No results for input(s): BNP in the last 8760 hours.  ProBNP (last 3 results) No results for input(s): PROBNP in the last 8760 hours.  CBG: Recent Labs  Lab 01/24/20 0758 01/24/20 1143 01/24/20 1758 01/24/20 2005 01/25/20 0743  GLUCAP 195* 183* 281* 298* 163*        Signed:  Silvano Bilis MD.  [facility] 01/25/2020, 10:38 AM

## 2020-01-28 ENCOUNTER — Encounter: Payer: Self-pay | Admitting: Neurosurgery

## 2020-01-28 ENCOUNTER — Other Ambulatory Visit
Admission: RE | Admit: 2020-01-28 | Discharge: 2020-01-28 | Disposition: A | Discharge status: Home / Self Care | Payer: Medicare HMO | Source: Ambulatory Visit | Attending: Neurosurgery | Admitting: Neurosurgery

## 2020-01-28 ENCOUNTER — Encounter: Payer: Self-pay | Admitting: Cardiology

## 2020-01-28 ENCOUNTER — Other Ambulatory Visit: Payer: Self-pay

## 2020-01-28 ENCOUNTER — Ambulatory Visit (INDEPENDENT_AMBULATORY_CARE_PROVIDER_SITE_OTHER): Payer: Medicare HMO | Admitting: Cardiology

## 2020-01-28 VITALS — BP 110/62 | HR 55 | Ht 76.0 in

## 2020-01-28 DIAGNOSIS — I428 Other cardiomyopathies: Secondary | ICD-10-CM

## 2020-01-28 DIAGNOSIS — Z01818 Encounter for other preprocedural examination: Secondary | ICD-10-CM | POA: Insufficient documentation

## 2020-01-28 DIAGNOSIS — I10 Essential (primary) hypertension: Secondary | ICD-10-CM | POA: Diagnosis not present

## 2020-01-28 DIAGNOSIS — Z20822 Contact with and (suspected) exposure to covid-19: Secondary | ICD-10-CM | POA: Insufficient documentation

## 2020-01-28 DIAGNOSIS — E7800 Pure hypercholesterolemia, unspecified: Secondary | ICD-10-CM

## 2020-01-28 DIAGNOSIS — I2510 Atherosclerotic heart disease of native coronary artery without angina pectoris: Secondary | ICD-10-CM

## 2020-01-28 HISTORY — DX: Heart failure, unspecified: I50.9

## 2020-01-28 HISTORY — DX: Atherosclerotic heart disease of native coronary artery without angina pectoris: I25.10

## 2020-01-28 NOTE — Patient Instructions (Addendum)
Medication Instructions:  Your physician has recommended you make the following change in your medication:  1- STOP Amlodipine. 2- START Lisinopril 5 mg by mouth once a day.   *If you need a refill on your cardiac medications before your next appointment, please call your pharmacy*  Follow-Up: At [facility], you and your health needs are our priority.  As part of our continuing mission to provide you with exceptional heart care, we have created designated Provider Care Teams.  These Care Teams include your primary Cardiologist (physician) and Advanced Practice Providers (APPs -  Physician Assistants and Nurse Practitioners) who all work together to provide you with the care you need, when you need it.  We recommend signing up for the patient portal called "MyChart".  Sign up information is provided on this After Visit Summary.  MyChart is used to connect with patients for Virtual Visits (Telemedicine).  Patients are able to view lab/test results, encounter notes, upcoming appointments, etc.  Non-urgent messages can be sent to your provider as well.   To learn more about what you can do with MyChart, go to [email or web address].    Your next appointment:   As needed.

## 2020-01-28 NOTE — Progress Notes (Signed)
Cardiology Office Note:    Date:  01/28/2020   ID:  Roy Austria., DOB 1946/03/05, MRN [id number]  PCP:  Rigoberto Noel, MD  [facility] Cardiologist:  Roy Odea, MD  [facility] Electrophysiologist:  None   Referring MD: Rigoberto Noel, MD   No chief complaint on file.   History of Present Illness:    Roy Hartnett. is a 74 y.o. male with a hx of nonischemic cardiomyopathy (EF 07/2007 EF 25-30%) normalized with echo 01/2020 EF 60%, hypertension, hyperlipidemia, diabetes, who presents for cardiac preoperative stratification.  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  Cervical laminectomy is being planned tomorrow.   He currently denies any symptoms of chest pain or shortness of breath.  Has neuropathy in the lower extremity limiting his mobility.  He was diagnosed with nonischemic cardiomyopathy back in 2009 at [facility].  Was placed on heart failure medications with improvement and normalization of ejection fraction.  Last echocardiogram on 01/15/2020 showed normal systolic function, impaired relaxation, EF 60 to 65% Prior echocardiogram at [facility], 07/2007 showed severely reduced ejection fraction, EF 25 to 30%.  Left heart cath 07/2007 nonobstructive CAD, 30% proximal LAD lesion.  RCA and left circumflex no stenosis.  Past Medical History:  Diagnosis Date  . CHF (congestive heart failure) (HCC)   . Coronary artery disease   . Diabetes mellitus without complication (HCC)   . Glaucoma   . HLD (hyperlipidemia)   . Hypertension   . Non-ischemic cardiomyopathy [facility])     Past Surgical History:  Procedure Laterality Date  . arm surgery Right    4x surgery as a child, cut arm falling through glass window   . HYDROCELE EXCISION / REPAIR    . REPLACEMENT TOTAL KNEE Right     Current Medications: Current Meds  Medication Sig  . amLODipine (NORVASC) 5 MG tablet Take 5 mg by mouth daily.  .  brimonidine (ALPHAGAN) 0.2 % ophthalmic solution Place 1 drop into the right eye in the morning and at bedtime.  . carvedilol (COREG) 6.25 MG tablet Take 6.25 mg by mouth 2 (two) times daily.  . cyanocobalamin 2000 MCG tablet Take 2,000 mcg by mouth daily.  . famotidine (PEPCID) 20 MG tablet Take 20 mg by mouth at bedtime as needed for heartburn.  . insulin aspart (NOVOLOG) 100 UNIT/ML injection Inject 4-8 Units into the skin 3 (three) times daily with meals. Sliding scale  . insulin detemir (LEVEMIR FLEXTOUCH) 100 UNIT/ML FlexPen Inject 12 Units into the skin at bedtime.   Marland Kitchen latanoprost (XALATAN) 0.005 % ophthalmic solution Place 1 drop into the right eye at bedtime.  . melatonin 5 MG TABS Take 5-10 mg by mouth at bedtime as needed.  . meloxicam (MOBIC) 7.5 MG tablet Take 7.5 mg by mouth daily.  . metFORMIN (GLUCOPHAGE-XR) 500 MG 24 hr tablet Take 500 mg by mouth 2 (two) times daily.  . Multiple Vitamins-Minerals (CENTRUM SILVER 50+MEN PO) Take 1 tablet by mouth daily.  . polyethylene glycol powder (GLYCOLAX/MIRALAX) 17 GM/SCOOP powder Take 17 g by mouth in the morning.  . simvastatin (ZOCOR) 20 MG tablet Take 20 mg by mouth daily.  . tamsulosin (FLOMAX) 0.4 MG CAPS capsule Take 1 capsule (0.4 mg total) by mouth daily after supper.  . traZODone (DESYREL) 50 MG tablet Take 25 mg by mouth at bedtime as needed for sleep.      Allergies:   Patient has no known allergies.  Social History   Socioeconomic History  . Marital status: Unknown    Spouse name: Not on file  . Number of children: Not on file  . Years of education: Not on file  . Highest education level: Not on file  Occupational History  . Not on file  Tobacco Use  . Smoking status: Never Smoker  . Smokeless tobacco: Never Used  Substance and Sexual Activity  . Alcohol use: No  . Drug use: Not on file  . Sexual activity: Not on file  Other Topics Concern  . Not on file  Social History Narrative  . Not on file   Social  Determinants of Health   Financial Resource Strain:   . Difficulty of Paying Living Expenses: Not on file  Food Insecurity:   . Worried About Programme researcher, broadcasting/film/video in the Last Year: Not on file  . Ran Out of Food in the Last Year: Not on file  Transportation Needs:   . Lack of Transportation (Medical): Not on file  . Lack of Transportation (Non-Medical): Not on file  Physical Activity:   . Days of Exercise per Week: Not on file  . Minutes of Exercise per Session: Not on file  Stress:   . Feeling of Stress : Not on file  Social Connections:   . Frequency of Communication with Friends and Family: Not on file  . Frequency of Social Gatherings with Friends and Family: Not on file  . Attends Religious Services: Not on file  . Active Member of Clubs or Organizations: Not on file  . Attends Banker Meetings: Not on file  . Marital Status: Not on file     Family History: The patient's family history is not on file.  ROS:   Please see the history of present illness.     All other systems reviewed and are negative.  EKGs/Labs/Other Studies Reviewed:    The following studies were reviewed today:   EKG:  EKG is  ordered today.  The ekg ordered today demonstrates sinus bradycardia, heart rate 55.  Recent Labs: 01/14/2020: TSH 1.101 01/22/2020: BUN 29; Creatinine, Ser 0.92; Magnesium 1.7; Potassium 3.8; Sodium 138 01/25/2020: Hemoglobin 13.4; Platelets 309  Recent Lipid Panel    Component Value Date/Time   CHOL 177 01/14/2020 2154   TRIG 116 01/14/2020 2154   HDL 59 01/14/2020 2154   CHOLHDL 3.0 01/14/2020 2154   VLDL 23 01/14/2020 2154   LDLCALC 95 01/14/2020 2154     Risk Assessment/Calculations:      Physical Exam:    VS:  BP 110/62   Pulse (!) 55   Ht 6\' 4"  (1.93 m)   BMI 31.00 kg/m     Wt Readings from Last 3 Encounters:  01/25/20 254 lb 11.2 oz (115.5 kg)  04/09/15 (!) 309 lb (140.2 kg)     GEN:  Well nourished, well developed in no acute  distress HEENT: Normal NECK: No JVD; No carotid bruits LYMPHATICS: No lymphadenopathy CARDIAC: RRR, no murmurs, rubs, gallops RESPIRATORY:  Clear to auscultation without rales, wheezing or rhonchi  ABDOMEN: Soft, non-tender, non-distended MUSCULOSKELETAL:  No edema; No deformity  SKIN: Warm and dry NEUROLOGIC:  Alert and oriented x 3 PSYCHIATRIC:  Normal affect   ASSESSMENT:    1. Pre-op examination   2. Coronary artery disease involving native coronary artery of native heart without angina pectoris   3. NICM (nonischemic cardiomyopathy) (HCC)   4. Primary hypertension   5. Pure hypercholesterolemia  PLAN:    In order of problems listed above:  1. Patient presents for cardiac risk stratification prior to cervical laminectomy.  Last echocardiogram 10/5 showed normal systolic function, EF 60 to 65%.  Previous left heart cath with no evidence of obstructive CAD.  Continue statin.  Aspirin being held due to upcoming surgery tomorrow, restart aspirin as soon as possible after surgery.  Patient can undergo surgery from a cardiac perspective with no additional testing.  Currently without symptoms, EF normal. 2. History of nonobstructive CAD.  Continue statin as prescribed.  Resume aspirin as soon as possible after surgery. 3. History of nonischemic cardiomyopathy, continue Coreg 6.25 twice daily.  Stop Norvasc, start lisinopril 5 mg daily.  Lisinopril also appropriate due to patient being a diabetic. 4. History of hypertension, BP controlled.  Coreg, lisinopril as above. 5. History of hyperlipidemia, continue statin.  Follow-up as needed.  Patient plans to follow-up with primary cardiologist at [facility] which I think is appropriate.   Total encounter time 60 minutes  Greater than 50% was spent in counseling and coordination of care with the patient    Medication Adjustments/Labs and Tests Ordered: Current medicines are reviewed at length with the patient today.  Concerns regarding  medicines are outlined above.  Orders Placed This Encounter  Procedures  . EKG 12-Lead   No orders of the defined types were placed in this encounter.   Patient Instructions  Medication Instructions:  Your physician has recommended you make the following change in your medication:  1- STOP Amlodipine. 2- START Lisinopril 5 mg by mouth once a day.   *If you need a refill on your cardiac medications before your next appointment, please call your pharmacy*  Follow-Up: At [facility], you and your health needs are our priority.  As part of our continuing mission to provide you with exceptional heart care, we have created designated Provider Care Teams.  These Care Teams include your primary Cardiologist (physician) and Advanced Practice Providers (APPs -  Physician Assistants and Nurse Practitioners) who all work together to provide you with the care you need, when you need it.  We recommend signing up for the patient portal called "MyChart".  Sign up information is provided on this After Visit Summary.  MyChart is used to connect with patients for Virtual Visits (Telemedicine).  Patients are able to view lab/test results, encounter notes, upcoming appointments, etc.  Non-urgent messages can be sent to your provider as well.   To learn more about what you can do with MyChart, go to [email or web address].    Your next appointment:   As needed.      Signed, Roy Odea, MD  01/28/2020 5:34 PM    [facility]

## 2020-01-28 NOTE — Progress Notes (Signed)
[facility] Perioperative Services  Pre-Admission/Anesthesia Testing Clinical Review  Date: 01/28/20  Patient Demographics:  Name: Roy [name]. 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 Clinical nursing staff has updated patient's PMH/PSHx, current medication list, and drug allergies/intolerances to ensure comprehensive history available to assist in medical decision making as it pertains to the aforementioned surgical procedure and anticipated anesthetic course.   Clinical Discussion:  Roy [name]. is a 74 y.o. [name] who is submitted for pre-surgical anesthesia review and clearance prior to him undergoing the above procedure. Patient has never been a smoker. Pertinent PMH includes: CAD, non-ischemic cardiomyopathy, CHF, HTN, HLD, T2DM, glaucoma, OA.  Patient is followed by cardiology Christell Constant, MD). He was last seen in the cardiology clinic on 10/18/2019; notes reviewed. Patient reporting that he had been doing well overall. His only complaints was episodes of hypotension; BP running 100s/60s in the morning, but increasing throughout the day. No chest pain, shortness of breath, PND, orthopnea, peripheral edema, palpitations, or presyncope/syncope.  Patient does note some mild vertiginous symptoms during the time that he was in clinic; VSS.  Patient has a history of nonischemic cardiomyopathy.  TTE in 07/2007 revealed a severe LV dilation with severe global hypokinesis and LVEF 25-30%.  Subsequent cardiac catheterization revealed nonobstructive CAD, with the most  significant lesion being about 30%.  Repeat TTE in 11/2014 revealed an improvement of his LVEF to 45% with septal hypokinesis.  Holter monitor study performed in 10/2018 revealed very frequent PVCs and a low heart rate with average of 58 bpm; beta-blocker dose was decreased.  Patient was scheduled follow-up outpatient cardiology in 6 months for reevaluation.  Patient admitted to [facility] from 01/14/2020 through 01/25/2020; notes from hospital course reviewed.  Patient presented to the ED with progressive weakness in his lower extremities. Patient receiving at home physical therapy services, however he is not making progress per his wife's report.  Patient reported an "electrical type tingling" at the base of his spine when he was in a seated position earlier today that has since resolved.  Patient denied any back pain.  Exam revealed LEFT lower extremity weakness, and even with assistance, patient only able to stand for about 10 seconds.  Lab work-up in the ED was negative for infection and electrolyte abnormalities.  High-sensitivity troponins (+) x 2 sets.  EKG showed sinus rhythm with PACs, RBBB, and a LAFB.  Given patient's elevated cardiac enzymes, he underwent inpatient TTE that revealed normal LV systolic function with a LVEF of 60 to 65%. Patient also complaining of claudication pain.  Bilateral lower extremity ABIs revealed no evidence of hemodynamically significant lower extremity arterial occlusive disease at rest (see full interpretation of cardiovascular testing below). Patient was subsequently admitted to the hospital for further evaluation by neurosurgery and placement consideration.  Patient was seen in consult by neurosurgery Adriana Simas, MD) during his admission. CT of the head was negative.  MRI imaging of the spine revealed severe spinal stenosis.  Family did not feel the patient was progressing with his therapy at home, and are requesting  placement in a skilled facility for  more aggressive physical therapy. Patient continued daily low dose ASA through 01/21/2020; surgery deferred and planned for 01/29/2020 pending medical and cardiac clearance. During admission, patient experienced acute  urinary retention for which a foley catheter was placed. Outpatient urology referral ordered. Discharging MD advised spouse that patient was cleared medically for surgery. Notes indicated that elevated cardiac enzymes were felt to be related to demand given normal TTE. Discharging hospitalist advising that chronic co-morbid conditions managed as follows:  T2DM on metformin and Levemir at home. Hgb A1c 5.8% (was 6.5 on 09/2019)  HTN within goal range on beta blocker (carvedilol) and CCB (amlodipine) at home.   HLD controlled on statin (simvastatin); TC 177, TG 116, HDL 59, and LDL 95.  Patient scheduled to undergo ACDF of C3-4 on 01/29/2020 with Dr. [name].  In review of patient's admission notes, Dr. Adriana Simas has requested cardiac clearance prior to proceeding.  Patient had ABI and TTE study performed during his most recent admission. We learned of patient was scheduled to undergo neurosurgical procedure late on Friday afternoon.  In review of his chart, presurgical cardiac clearance has been requested by the performing neurosurgeon.  Unable to contact patient to determine cardiologist.  In efforts to prevent surgical delays, [facility] in [place] was contacted and appointment for patient to be seen was secured for Monday (10/18/202)1 at 1320 p.m.  Patient was seen in preoperative consult by Dr. Debbe Odea, MD (cardiology); notes reviewed.  Amlodipine was stopped and patient was started on lisinopril 5 mg daily.  Per cardiology, "patient can undergo surgery from cardiac perspective with no additional testing.  Patient is currently without symptoms, EF is normal".  Patient scheduled to follow-up with his primary cardiologist at [facility] as previously scheduled. This patient is not  on daily anticoagulation therapy.   He denies previous perioperative complications with anesthesia. He underwent a neuraxial anesthetic course at [facility] (ASA III) in 12/2014 with no documented complications.  Again, patient has a history of significant spinal disease.  Will include most recent imaging results below for review by the anesthesia team in the event that a neuraxial anesthetic course is considered as part of patient's plan of care.  Vitals with BMI 01/28/2020 01/25/2020 01/25/2020  Height 6\' 4"  - -  Weight (No Data) - -  BMI - - -  Systolic 110 123 491  Diastolic 62 70 80  Pulse 55 55 59    Providers/Specialists:   NOTE: Primary physician provider listed below. Patient may have been seen by APP or partner within same practice.   PROVIDER ROLE LAST Tia Masker, MD Neurosurgery 01/21/2020  Rigoberto Noel, MD Primary Care Provider 10/12/2019  Launa Flight, MD Cardiology 10/18/2019  Debbe Odea, MD Cardiology 01/28/2020   Allergies:  Patient has no known allergies.  Current Home Medications:   No current facility-administered medications for this encounter.   Marland Kitchen amLODipine (NORVASC) 5 MG tablet  . brimonidine (ALPHAGAN) 0.2 % ophthalmic solution  . carvedilol (COREG) 6.25 MG tablet  . cyanocobalamin 2000 MCG tablet  . famotidine (PEPCID) 20 MG tablet  . insulin aspart (NOVOLOG) 100 UNIT/ML injection  . insulin detemir (LEVEMIR FLEXTOUCH) 100 UNIT/ML FlexPen  . latanoprost (XALATAN) 0.005 % ophthalmic solution  . melatonin 5 MG TABS  . meloxicam (MOBIC) 7.5 MG tablet  . metFORMIN (GLUCOPHAGE-XR) 500 MG 24 hr tablet  . Multiple Vitamins-Minerals (CENTRUM SILVER 50+MEN PO)  . polyethylene glycol powder (GLYCOLAX/MIRALAX) 17 GM/SCOOP powder  .  simvastatin (ZOCOR) 20 MG tablet  . tamsulosin (FLOMAX) 0.4 MG CAPS capsule  . traZODone (DESYREL) 50 MG tablet   History:   Past Medical History:  Diagnosis Date  . CHF (congestive heart failure)  (HCC)   . Coronary artery disease   . Diabetes mellitus without complication (HCC)   . Glaucoma   . HLD (hyperlipidemia)   . Hypertension   . Non-ischemic cardiomyopathy [facility])    Past Surgical History:  Procedure Laterality Date  . arm surgery Right    4x surgery as a child, cut arm falling through glass window   . HYDROCELE EXCISION / REPAIR    . REPLACEMENT TOTAL KNEE Right    No family history on file. Social History   Tobacco Use  . Smoking status: Never Smoker  . Smokeless tobacco: Never Used  Substance Use Topics  . Alcohol use: No  . Drug use: Not on file    Pertinent Clinical Results:  LABS: Labs reviewed: Acceptable for surgery.     ECG: Date: 01/14/2020 Time ECG obtained: 1137 AM Rate: 72 bpm Rhythm: Sinus rhythm with PACs; RBBB; LAFB Axis (leads I and aVF): Left axis deviation Intervals: PR 188 ms. QRS 182 ms. QTc 494 ms. ST segment and T wave changes: No evidence of significant acute ST segment elevation or depression Comparison: Similar to previous tracing obtained on 11/02/2018 NOTE: Tracing obtained at [facility]; unable for review. Above based on cardiologist's interpretation.    IMAGING / PROCEDURES: ECHOCARDIOGRAM done on 01/15/2020 1. Left ventricular ejection fraction, by estimation, is 60 to 65%. The left ventricle has normal function. The left ventricle has no regional  wall motion abnormalities. There is mild left ventricular hypertrophy.  Left ventricular diastolic parameters are consistent with Grade I diastolic dysfunction (impaired relaxation).  2. Right ventricular systolic function is normal. The right ventricular size is normal.  3. The mitral valve is normal in structure. Trivial mitral valve regurgitation. No evidence of mitral stenosis.  4. The aortic valve was not well visualized. Aortic valve regurgitation is not visualized. No aortic stenosis is present.  5. Aortic dilatation noted. There is borderline dilatation of the aortic root,  measuring 38 mm.  6. The inferior vena cava is normal in size with greater than 50% respiratory variability, suggesting right atrial pressure of 3 mmHg.   MRI CERVICAL SPINE WO CONTRAST done on 01/19/2020 1. 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 The canal is narrowed to 68mm. Focal T2 cord signal abnormality is present at this level, most consistent with edema. 2. Moderate right and mild left foraminal stenosis at C2-3. 3. Moderate right and mild left foraminal stenosis at C4-5. 4. Severe right and moderate left foraminal stenosis at C5-6. 5. Moderate bilateral foraminal narrowing at C6-7 and C7-T1.  US ARTERIAL ABI done on 01/16/2020 1. Right ABI:  1.17. Right Lower Extremity: Normal arterial waveforms at the ankle. A nonspecific cardiac arrhythmia is noted. 2. Left ABI:  1.02. Left Lower Extremity:  Normal arterial waveforms at the ankle. 3. No evidence of hemodynamically significant lower extremity arterial occlusive disease at rest.  MRI THORACIC SPINE WO CONTRAST 1. No acute fracture in the thoracic region. Chronic fusion at T8 and T9. 2. Degenerative changes throughout the thoracic region as outlined above. At T5-6, there are bilateral posterolateral disc herniations more prominent towards the left. Effacement of the ventral subarachnoid space with mild cord deformity. Subarachnoid space remains present dorsal to the cord however. Bilateral foraminal stenosis. No other level shows any deformation of  the cord. 3. At T9-10, there is facet arthropathy with bilateral foraminal stenosis. 4. At T10-11, there is bulging of the disc and facet arthropathy with foraminal stenosis left worse than right. 5. At T11-12, there is bulging of the disc and facet arthropathy with foraminal stenosis left worse than right.  MRI LUMBAR SPINE WO CONTRAST done on 01/14/2020 1. Multifactorial degenerative changes at L2-3 through L4-5 with resultant severe canal with bilateral subarticular stenosis as above, most  pronounced at L3-4. 2. 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 3. Moderate to severe bilateral L2 through L5 foraminal stenosis as detailed above.   Impression and Plan:  Roy [name]. has been referred for pre-anesthesia review and clearance prior to him undergoing the planned anesthetic and procedural courses. Available labs, pertinent testing, and imaging results were personally reviewed by me. This patient has been appropriately cleared by cardiology.   Based on clinical review performed today (01/28/20), barring any significant acute changes in the patient's overall condition, it is anticipated that he will be able to proceed with the planned surgical intervention. Any acute changes in clinical condition may necessitate his procedure being postponed and/or cancelled. Pre-surgical instructions were reviewed with the patient during his PAT appointment and questions were fielded by PAT clinical staff.  Quentin Mulling, MSN, APRN, FNP-C, CEN [facility]  Peri-operative Services Nurse Practitioner Phone: [phone] 01/28/20 2:54 PM  NOTE: This note has been prepared using Dragon dictation software. Despite my best ability to proofread, there is always the potential that unintentional transcriptional errors may still occur from this process.

## 2020-01-29 ENCOUNTER — Inpatient Hospital Stay: Payer: Medicare HMO | Admitting: Urgent Care

## 2020-01-29 ENCOUNTER — Inpatient Hospital Stay
Admission: RE | Admit: 2020-01-29 | Discharge: 2020-02-15 | DRG: 029 | Disposition: A | Discharge status: Skilled Nursing Facility | Payer: Medicare HMO | Attending: Neurosurgery | Admitting: Neurosurgery

## 2020-01-29 ENCOUNTER — Inpatient Hospital Stay: Payer: Medicare HMO

## 2020-01-29 ENCOUNTER — Encounter: Payer: Self-pay | Admitting: Neurosurgery

## 2020-01-29 ENCOUNTER — Encounter
Admission: RE | Disposition: A | Discharge status: Skilled Nursing Facility | Payer: Self-pay | Source: Home / Self Care | Attending: Neurosurgery

## 2020-01-29 DIAGNOSIS — Z79899 Other long term (current) drug therapy: Secondary | ICD-10-CM

## 2020-01-29 DIAGNOSIS — Z20822 Contact with and (suspected) exposure to covid-19: Secondary | ICD-10-CM | POA: Diagnosis present

## 2020-01-29 DIAGNOSIS — E119 Type 2 diabetes mellitus without complications: Secondary | ICD-10-CM | POA: Diagnosis present

## 2020-01-29 DIAGNOSIS — E785 Hyperlipidemia, unspecified: Secondary | ICD-10-CM | POA: Diagnosis not present

## 2020-01-29 DIAGNOSIS — Z419 Encounter for procedure for purposes other than remedying health state, unspecified: Secondary | ICD-10-CM

## 2020-01-29 DIAGNOSIS — M2578 Osteophyte, vertebrae: Secondary | ICD-10-CM | POA: Diagnosis present

## 2020-01-29 DIAGNOSIS — K219 Gastro-esophageal reflux disease without esophagitis: Secondary | ICD-10-CM | POA: Diagnosis present

## 2020-01-29 DIAGNOSIS — M48061 Spinal stenosis, lumbar region without neurogenic claudication: Secondary | ICD-10-CM | POA: Diagnosis present

## 2020-01-29 DIAGNOSIS — I110 Hypertensive heart disease with heart failure: Secondary | ICD-10-CM | POA: Diagnosis present

## 2020-01-29 DIAGNOSIS — Z981 Arthrodesis status: Secondary | ICD-10-CM | POA: Diagnosis not present

## 2020-01-29 DIAGNOSIS — M4802 Spinal stenosis, cervical region: Secondary | ICD-10-CM | POA: Diagnosis present

## 2020-01-29 DIAGNOSIS — Z9889 Other specified postprocedural states: Secondary | ICD-10-CM

## 2020-01-29 DIAGNOSIS — Z7982 Long term (current) use of aspirin: Secondary | ICD-10-CM

## 2020-01-29 DIAGNOSIS — Z96651 Presence of right artificial knee joint: Secondary | ICD-10-CM | POA: Diagnosis present

## 2020-01-29 DIAGNOSIS — I5032 Chronic diastolic (congestive) heart failure: Secondary | ICD-10-CM | POA: Diagnosis not present

## 2020-01-29 DIAGNOSIS — Z794 Long term (current) use of insulin: Secondary | ICD-10-CM

## 2020-01-29 DIAGNOSIS — G5602 Carpal tunnel syndrome, left upper limb: Secondary | ICD-10-CM | POA: Diagnosis present

## 2020-01-29 DIAGNOSIS — G9520 Unspecified cord compression: Principal | ICD-10-CM | POA: Diagnosis present

## 2020-01-29 DIAGNOSIS — I10 Essential (primary) hypertension: Secondary | ICD-10-CM | POA: Diagnosis present

## 2020-01-29 DIAGNOSIS — I2510 Atherosclerotic heart disease of native coronary artery without angina pectoris: Secondary | ICD-10-CM | POA: Diagnosis present

## 2020-01-29 DIAGNOSIS — R338 Other retention of urine: Secondary | ICD-10-CM | POA: Diagnosis present

## 2020-01-29 DIAGNOSIS — N401 Enlarged prostate with lower urinary tract symptoms: Secondary | ICD-10-CM | POA: Diagnosis present

## 2020-01-29 HISTORY — DX: Other cardiomyopathies: I42.8

## 2020-01-29 HISTORY — DX: Hyperlipidemia, unspecified: E78.5

## 2020-01-29 HISTORY — DX: Unspecified glaucoma: H40.9

## 2020-01-29 HISTORY — PX: ANTERIOR CERVICAL DECOMP/DISCECTOMY FUSION: SHX1161

## 2020-01-29 LAB — TYPE AND SCREEN
ABO/RH(D): A POS
Antibody Screen: NEGATIVE

## 2020-01-29 LAB — GLUCOSE, CAPILLARY
Glucose-Capillary: 155 mg/dL — ABNORMAL HIGH (ref 70–99)
Glucose-Capillary: 193 mg/dL — ABNORMAL HIGH (ref 70–99)
Glucose-Capillary: 220 mg/dL — ABNORMAL HIGH (ref 70–99)

## 2020-01-29 LAB — SARS CORONAVIRUS 2 (TAT 6-24 HRS): SARS Coronavirus 2: NEGATIVE

## 2020-01-29 IMAGING — RF DG CERVICAL SPINE 2 OR 3 VIEWS
1 series · 1 of 1 positions shown · non-contrast
Comparison: None.

CLINICAL DATA: Elective surgery

EXAM:
CERVICAL SPINE - 2-3 VIEW

[Series 1: dg x-ray · 0.20mm/px · 1 of 1 slices shown]
[im 1/1]
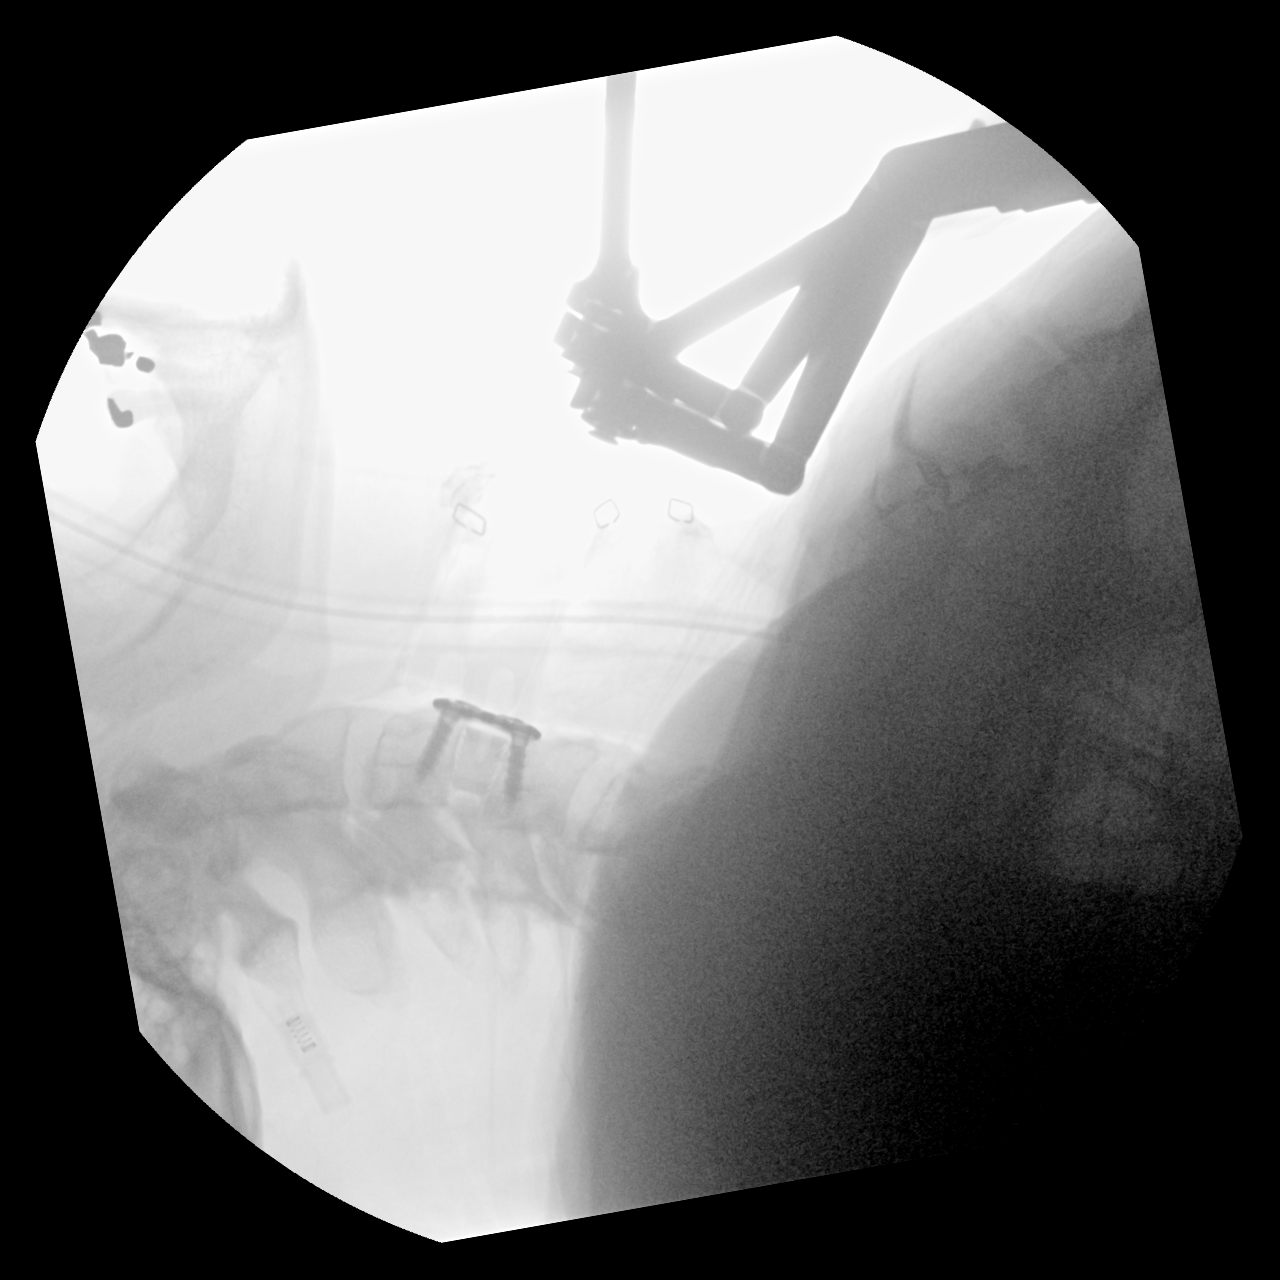

[1 of 1 positions shown; findings below may reference images not displayed]

FINDINGS: Changes of ACDF at C3-4. No hardware complicating feature. Normal
alignment.
IMPRESSION: No hardware complicating feature.

## 2020-01-29 SURGERY — ANTERIOR CERVICAL DECOMPRESSION/DISCECTOMY FUSION 1 LEVEL
Anesthesia: General

## 2020-01-29 MED ORDER — METFORMIN HCL ER 500 MG PO TB24
500.0000 mg | ORAL_TABLET | Freq: Two times a day (BID) | ORAL | Status: DC
  Filled 2020-01-29: qty 1

## 2020-01-29 MED ORDER — POLYETHYLENE GLYCOL 3350 17 GM/SCOOP PO POWD
17.0000 g | Freq: Every morning | ORAL | Status: DC
  Filled 2020-01-29: qty 255

## 2020-01-29 MED ORDER — HYDROMORPHONE HCL 1 MG/ML IJ SOLN: 0.2500 mg | INTRAMUSCULAR | Status: DC | PRN

## 2020-01-29 MED ORDER — EPHEDRINE 5 MG/ML INJ
INTRAVENOUS | Status: AC
  Filled 2020-01-29: qty 10

## 2020-01-29 MED ORDER — INSULIN ASPART 100 UNIT/ML ~~LOC~~ SOLN
0.0000 [IU] | Freq: Three times a day (TID) | SUBCUTANEOUS | Status: DC
  Administered 2020-01-30 (×2): 3 [IU] via SUBCUTANEOUS
  Administered 2020-01-30: 5 [IU] via SUBCUTANEOUS
  Administered 2020-01-31 (×2): 3 [IU] via SUBCUTANEOUS
  Administered 2020-01-31 – 2020-02-01 (×2): 5 [IU] via SUBCUTANEOUS
  Administered 2020-02-01 – 2020-02-03 (×6): 3 [IU] via SUBCUTANEOUS
  Administered 2020-02-03: 5 [IU] via SUBCUTANEOUS
  Administered 2020-02-03 – 2020-02-04 (×2): 3 [IU] via SUBCUTANEOUS
  Administered 2020-02-04 – 2020-02-05 (×4): 2 [IU] via SUBCUTANEOUS
  Administered 2020-02-06: 5 [IU] via SUBCUTANEOUS
  Administered 2020-02-06: 2 [IU] via SUBCUTANEOUS
  Administered 2020-02-07 – 2020-02-08 (×3): 3 [IU] via SUBCUTANEOUS
  Administered 2020-02-08 (×2): 2 [IU] via SUBCUTANEOUS
  Administered 2020-02-09: 3 [IU] via SUBCUTANEOUS
  Administered 2020-02-09 – 2020-02-10 (×2): 2 [IU] via SUBCUTANEOUS
  Administered 2020-02-10 – 2020-02-11 (×2): 3 [IU] via SUBCUTANEOUS
  Administered 2020-02-11: 5 [IU] via SUBCUTANEOUS
  Administered 2020-02-12 (×2): 3 [IU] via SUBCUTANEOUS
  Administered 2020-02-13: 5 [IU] via SUBCUTANEOUS
  Administered 2020-02-13: 3 [IU] via SUBCUTANEOUS
  Administered 2020-02-14: 2 [IU] via SUBCUTANEOUS
  Administered 2020-02-14: 3 [IU] via SUBCUTANEOUS
  Administered 2020-02-14: 2 [IU] via SUBCUTANEOUS
  Administered 2020-02-15: 5 [IU] via SUBCUTANEOUS
  Filled 2020-01-29 (×39): qty 1

## 2020-01-29 MED ORDER — KETAMINE HCL 10 MG/ML IJ SOLN
INTRAMUSCULAR | Status: DC | PRN
  Administered 2020-01-29 (×2): 25 mg via INTRAVENOUS

## 2020-01-29 MED ORDER — FAMOTIDINE 20 MG PO TABS: 20.0000 mg | ORAL_TABLET | Freq: Every evening | ORAL | Status: DC | PRN

## 2020-01-29 MED ORDER — CHLORHEXIDINE GLUCONATE 0.12 % MT SOLN
OROMUCOSAL | Status: AC
  Administered 2020-01-29: 15 mL via OROMUCOSAL
  Filled 2020-01-29: qty 15

## 2020-01-29 MED ORDER — CEFAZOLIN SODIUM-DEXTROSE 2-4 GM/100ML-% IV SOLN
2.0000 g | INTRAVENOUS | Status: AC
  Administered 2020-01-29: 2 g via INTRAVENOUS

## 2020-01-29 MED ORDER — SODIUM CHLORIDE 0.9% FLUSH
3.0000 mL | Freq: Two times a day (BID) | INTRAVENOUS | Status: DC
  Administered 2020-01-29 – 2020-02-14 (×28): 3 mL via INTRAVENOUS

## 2020-01-29 MED ORDER — THROMBIN 5000 UNITS EX SOLR
CUTANEOUS | Status: AC
  Filled 2020-01-29: qty 5000

## 2020-01-29 MED ORDER — OXYCODONE HCL 5 MG PO TABS
5.0000 mg | ORAL_TABLET | ORAL | Status: DC | PRN
  Administered 2020-02-10 – 2020-02-15 (×7): 5 mg via ORAL
  Filled 2020-01-29 (×7): qty 1

## 2020-01-29 MED ORDER — VASOPRESSIN 20 UNIT/ML IV SOLN
INTRAVENOUS | Status: DC | PRN
  Administered 2020-01-29: 2 [IU] via INTRAVENOUS

## 2020-01-29 MED ORDER — BISACODYL 5 MG PO TBEC
5.0000 mg | DELAYED_RELEASE_TABLET | Freq: Every day | ORAL | Status: DC | PRN
  Filled 2020-01-29: qty 1

## 2020-01-29 MED ORDER — OXYCODONE HCL 5 MG PO TABS
10.0000 mg | ORAL_TABLET | ORAL | Status: DC | PRN
  Administered 2020-02-11 – 2020-02-12 (×6): 10 mg via ORAL
  Filled 2020-01-29 (×8): qty 2

## 2020-01-29 MED ORDER — ORAL CARE MOUTH RINSE: 15.0000 mL | Freq: Once | OROMUCOSAL | Status: AC

## 2020-01-29 MED ORDER — TAMSULOSIN HCL 0.4 MG PO CAPS
0.4000 mg | ORAL_CAPSULE | Freq: Every day | ORAL | Status: DC
  Administered 2020-01-30 – 2020-02-14 (×16): 0.4 mg via ORAL
  Filled 2020-01-29 (×16): qty 1

## 2020-01-29 MED ORDER — SODIUM CHLORIDE 0.9 % IV SOLN
INTRAVENOUS | Status: DC | PRN
  Administered 2020-01-29: 40 ug/min via INTRAVENOUS

## 2020-01-29 MED ORDER — ONDANSETRON HCL 4 MG/2ML IJ SOLN: 4.0000 mg | Freq: Four times a day (QID) | INTRAMUSCULAR | Status: DC | PRN

## 2020-01-29 MED ORDER — SENNA 8.6 MG PO TABS
2.0000 | ORAL_TABLET | Freq: Two times a day (BID) | ORAL | Status: DC
  Administered 2020-01-29 – 2020-02-15 (×31): 17.2 mg via ORAL
  Filled 2020-01-29 (×33): qty 2

## 2020-01-29 MED ORDER — PROPOFOL 500 MG/50ML IV EMUL
INTRAVENOUS | Status: DC | PRN
  Administered 2020-01-29: 150 ug/kg/min via INTRAVENOUS

## 2020-01-29 MED ORDER — KETAMINE HCL 50 MG/ML IJ SOLN
INTRAMUSCULAR | Status: AC
  Filled 2020-01-29: qty 10

## 2020-01-29 MED ORDER — LIDOCAINE HCL (CARDIAC) PF 100 MG/5ML IV SOSY
PREFILLED_SYRINGE | INTRAVENOUS | Status: DC | PRN
  Administered 2020-01-29: 100 mg via INTRAVENOUS

## 2020-01-29 MED ORDER — ACETAMINOPHEN 500 MG PO TABS
1000.0000 mg | ORAL_TABLET | Freq: Three times a day (TID) | ORAL | Status: AC
  Administered 2020-01-29 – 2020-02-01 (×6): 1000 mg via ORAL
  Filled 2020-01-29 (×8): qty 2

## 2020-01-29 MED ORDER — LIDOCAINE-EPINEPHRINE 1 %-1:100000 IJ SOLN
INTRAMUSCULAR | Status: DC | PRN
  Administered 2020-01-29: 7 mL

## 2020-01-29 MED ORDER — EPHEDRINE SULFATE 50 MG/ML IJ SOLN
INTRAMUSCULAR | Status: DC | PRN
  Administered 2020-01-29 (×2): 5 mg via INTRAVENOUS
  Administered 2020-01-29: 10 mg via INTRAVENOUS
  Administered 2020-01-29: 5 mg via INTRAVENOUS
  Administered 2020-01-29: 10 mg via INTRAVENOUS

## 2020-01-29 MED ORDER — LATANOPROST 0.005 % OP SOLN
1.0000 [drp] | Freq: Every day | OPHTHALMIC | Status: DC
  Administered 2020-01-29 – 2020-02-14 (×17): 1 [drp] via OPHTHALMIC
  Filled 2020-01-29: qty 2.5

## 2020-01-29 MED ORDER — SODIUM CHLORIDE 0.9% FLUSH: 3.0000 mL | INTRAVENOUS | Status: DC | PRN

## 2020-01-29 MED ORDER — PHENOL 1.4 % MT LIQD
1.0000 | OROMUCOSAL | Status: DC | PRN
  Filled 2020-01-29: qty 177

## 2020-01-29 MED ORDER — REMIFENTANIL HCL 1 MG IV SOLR
INTRAVENOUS | Status: AC
  Filled 2020-01-29: qty 1000

## 2020-01-29 MED ORDER — SUCCINYLCHOLINE CHLORIDE 20 MG/ML IJ SOLN
INTRAMUSCULAR | Status: DC | PRN
  Administered 2020-01-29: 120 mg via INTRAVENOUS

## 2020-01-29 MED ORDER — OXYCODONE HCL 5 MG PO TABS: 5.0000 mg | ORAL_TABLET | Freq: Once | ORAL | Status: DC | PRN

## 2020-01-29 MED ORDER — LIDOCAINE HCL (PF) 2 % IJ SOLN
INTRAMUSCULAR | Status: AC
  Filled 2020-01-29: qty 5

## 2020-01-29 MED ORDER — LACTATED RINGERS IV SOLN: INTRAVENOUS | Status: DC | PRN

## 2020-01-29 MED ORDER — FENTANYL CITRATE (PF) 100 MCG/2ML IJ SOLN
INTRAMUSCULAR | Status: AC
  Filled 2020-01-29: qty 2

## 2020-01-29 MED ORDER — FENTANYL CITRATE (PF) 100 MCG/2ML IJ SOLN: 25.0000 ug | INTRAMUSCULAR | Status: DC | PRN

## 2020-01-29 MED ORDER — CHLORHEXIDINE GLUCONATE 0.12 % MT SOLN: 15.0000 mL | Freq: Once | OROMUCOSAL | Status: AC

## 2020-01-29 MED ORDER — PROPOFOL 500 MG/50ML IV EMUL
INTRAVENOUS | Status: AC
  Filled 2020-01-29: qty 50

## 2020-01-29 MED ORDER — FLEET ENEMA 7-19 GM/118ML RE ENEM: 1.0000 | ENEMA | Freq: Once | RECTAL | Status: DC | PRN

## 2020-01-29 MED ORDER — AMLODIPINE BESYLATE 5 MG PO TABS
5.0000 mg | ORAL_TABLET | Freq: Every day | ORAL | Status: DC
  Administered 2020-01-30 – 2020-02-15 (×17): 5 mg via ORAL
  Filled 2020-01-29 (×17): qty 1

## 2020-01-29 MED ORDER — REMIFENTANIL HCL 1 MG IV SOLR
INTRAVENOUS | Status: DC | PRN
  Administered 2020-01-29: .1 ug/kg/min via INTRAVENOUS
  Administered 2020-01-29: .2 ug/kg/min via INTRAVENOUS

## 2020-01-29 MED ORDER — SIMVASTATIN 20 MG PO TABS
20.0000 mg | ORAL_TABLET | Freq: Every day | ORAL | Status: DC
  Administered 2020-01-29 – 2020-02-14 (×17): 20 mg via ORAL
  Filled 2020-01-29 (×17): qty 1

## 2020-01-29 MED ORDER — FENTANYL CITRATE (PF) 100 MCG/2ML IJ SOLN
INTRAMUSCULAR | Status: DC | PRN
  Administered 2020-01-29: 50 ug via INTRAVENOUS

## 2020-01-29 MED ORDER — PHENYLEPHRINE HCL (PRESSORS) 10 MG/ML IV SOLN
INTRAVENOUS | Status: DC | PRN
  Administered 2020-01-29: 300 ug via INTRAVENOUS
  Administered 2020-01-29 (×2): 200 ug via INTRAVENOUS

## 2020-01-29 MED ORDER — GELATIN ABSORBABLE 12-7 MM EX MISC
CUTANEOUS | Status: AC
  Filled 2020-01-29: qty 1

## 2020-01-29 MED ORDER — ACETAMINOPHEN 10 MG/ML IV SOLN
INTRAVENOUS | Status: DC | PRN
  Administered 2020-01-29: 1000 mg via INTRAVENOUS

## 2020-01-29 MED ORDER — CEFAZOLIN SODIUM-DEXTROSE 2-4 GM/100ML-% IV SOLN
INTRAVENOUS | Status: AC
  Filled 2020-01-29: qty 100

## 2020-01-29 MED ORDER — OXYCODONE HCL 5 MG/5ML PO SOLN: 5.0000 mg | Freq: Once | ORAL | Status: DC | PRN

## 2020-01-29 MED ORDER — PROPOFOL 10 MG/ML IV BOLUS
INTRAVENOUS | Status: DC | PRN
  Administered 2020-01-29: 140 mg via INTRAVENOUS

## 2020-01-29 MED ORDER — SODIUM CHLORIDE 0.9 % IV SOLN
50.0000 mL/h | INTRAVENOUS | Status: DC
  Administered 2020-01-29: 50 mL/h via INTRAVENOUS

## 2020-01-29 MED ORDER — THROMBIN 5000 UNITS EX SOLR
CUTANEOUS | Status: DC | PRN
  Administered 2020-01-29: 5000 [IU] via TOPICAL

## 2020-01-29 MED ORDER — POLYETHYLENE GLYCOL 3350 17 G PO PACK
17.0000 g | PACK | Freq: Every day | ORAL | Status: DC | PRN
  Filled 2020-01-29 (×3): qty 1

## 2020-01-29 MED ORDER — VASOPRESSIN 20 UNIT/ML IV SOLN
INTRAVENOUS | Status: AC
  Filled 2020-01-29: qty 1

## 2020-01-29 MED ORDER — ROCURONIUM BROMIDE 100 MG/10ML IV SOLN
INTRAVENOUS | Status: DC | PRN
  Administered 2020-01-29: 20 mg via INTRAVENOUS

## 2020-01-29 MED ORDER — LIDOCAINE-EPINEPHRINE 1 %-1:100000 IJ SOLN
INTRAMUSCULAR | Status: AC
  Filled 2020-01-29: qty 1

## 2020-01-29 MED ORDER — ACETAMINOPHEN 10 MG/ML IV SOLN
INTRAVENOUS | Status: AC
  Filled 2020-01-29: qty 100

## 2020-01-29 MED ORDER — METHOCARBAMOL 1000 MG/10ML IJ SOLN
500.0000 mg | Freq: Three times a day (TID) | INTRAVENOUS | Status: AC | PRN
  Filled 2020-01-29: qty 5

## 2020-01-29 MED ORDER — BRIMONIDINE TARTRATE 0.2 % OP SOLN
1.0000 [drp] | Freq: Two times a day (BID) | OPHTHALMIC | Status: DC
  Administered 2020-01-29 – 2020-02-15 (×34): 1 [drp] via OPHTHALMIC
  Filled 2020-01-29 (×2): qty 5

## 2020-01-29 MED ORDER — ONDANSETRON HCL 4 MG/2ML IJ SOLN
INTRAMUSCULAR | Status: DC | PRN
  Administered 2020-01-29: 4 mg via INTRAVENOUS

## 2020-01-29 MED ORDER — DEXAMETHASONE SODIUM PHOSPHATE 10 MG/ML IJ SOLN
INTRAMUSCULAR | Status: DC | PRN
  Administered 2020-01-29: 8 mg via INTRAVENOUS

## 2020-01-29 MED ORDER — DOCUSATE SODIUM 100 MG PO CAPS
100.0000 mg | ORAL_CAPSULE | Freq: Two times a day (BID) | ORAL | Status: DC
  Administered 2020-01-29 – 2020-02-15 (×32): 100 mg via ORAL
  Filled 2020-01-29 (×32): qty 1

## 2020-01-29 MED ORDER — SODIUM CHLORIDE 0.9 % IV SOLN: INTRAVENOUS | Status: DC

## 2020-01-29 MED ORDER — ALUM & MAG HYDROXIDE-SIMETH 200-200-20 MG/5ML PO SUSP: 30.0000 mL | Freq: Four times a day (QID) | ORAL | Status: DC | PRN

## 2020-01-29 MED ORDER — DEXAMETHASONE SODIUM PHOSPHATE 10 MG/ML IJ SOLN
INTRAMUSCULAR | Status: AC
  Filled 2020-01-29: qty 1

## 2020-01-29 MED ORDER — ONDANSETRON HCL 4 MG PO TABS: 4.0000 mg | ORAL_TABLET | Freq: Four times a day (QID) | ORAL | Status: DC | PRN

## 2020-01-29 MED ORDER — MENTHOL 3 MG MT LOZG
1.0000 | LOZENGE | OROMUCOSAL | Status: DC | PRN
  Filled 2020-01-29: qty 9

## 2020-01-29 MED ORDER — CARVEDILOL 3.125 MG PO TABS
6.2500 mg | ORAL_TABLET | Freq: Two times a day (BID) | ORAL | Status: DC
  Administered 2020-01-29 – 2020-02-15 (×34): 6.25 mg via ORAL
  Filled 2020-01-29 (×34): qty 2

## 2020-01-29 MED ORDER — METHOCARBAMOL 500 MG PO TABS: 500.0000 mg | ORAL_TABLET | Freq: Three times a day (TID) | ORAL | Status: AC | PRN

## 2020-01-29 SURGICAL SUPPLY — 59 items
ALLOGRAFT LORDOTIC 8X11X14 (Bone Implant) ×2 IMPLANT
BAG URINE DRAIN 2000ML AR STRL (UROLOGICAL SUPPLIES) ×2 IMPLANT
BIT DRILL 13 (BIT) IMPLANT
BLADE BOVIE TIP EXT 4 (BLADE) ×2 IMPLANT
BLADE SURG 15 STRL LF DISP TIS (BLADE) ×1 IMPLANT
BLADE SURG 15 STRL SS (BLADE) ×1
BUR DIAMOND COARSE 4.0 RND (BURR) ×2 IMPLANT
BUR NEURO DRILL SOFT 3.0X3.8M (BURR) ×2 IMPLANT
CANISTER SUCT 1200ML W/VALVE (MISCELLANEOUS) ×2 IMPLANT
CHLORAPREP W/TINT 26 (MISCELLANEOUS) ×4 IMPLANT
COUNTER NEEDLE 20/40 LG (NEEDLE) ×2 IMPLANT
COVER LIGHT HANDLE STERIS (MISCELLANEOUS) ×4 IMPLANT
COVER WAND RF STERILE (DRAPES) ×2 IMPLANT
CUP MEDICINE 2OZ PLAST GRAD ST (MISCELLANEOUS) ×4 IMPLANT
DERMABOND ADVANCED (GAUZE/BANDAGES/DRESSINGS) ×1
DERMABOND ADVANCED .7 DNX12 (GAUZE/BANDAGES/DRESSINGS) ×1 IMPLANT
DRAPE C-ARM 42X72 X-RAY (DRAPES) ×4 IMPLANT
DRAPE MICROSCOPE SPINE 48X150 (DRAPES) ×2 IMPLANT
DRAPE SURG 17X11 SM STRL (DRAPES) ×4 IMPLANT
DRAPE THYROID T SHEET (DRAPES) ×2 IMPLANT
ELECT CAUTERY BLADE TIP 2.5 (TIP) ×2
ELECT EZSTD 165MM 6.5IN (MISCELLANEOUS) ×2
ELECTRODE CAUTERY BLDE TIP 2.5 (TIP) ×1 IMPLANT
ELECTRODE EZSTD 165MM 6.5IN (MISCELLANEOUS) ×1 IMPLANT
FEE INTRAOP MONITOR IMPULS NCS (MISCELLANEOUS) ×1 IMPLANT
GAUZE SPONGE 4X4 12PLY STRL (GAUZE/BANDAGES/DRESSINGS) ×2 IMPLANT
GLOVE BIOGEL PI IND STRL 8 (GLOVE) ×1 IMPLANT
GLOVE BIOGEL PI INDICATOR 8 (GLOVE) ×1
GLOVE INDICATOR 7.0 STRL GRN (GLOVE) ×2 IMPLANT
GLOVE SURG SYN 7.0 (GLOVE) ×4 IMPLANT
GLOVE SURG SYN 8.0 (GLOVE) ×4 IMPLANT
GOWN STRL REUS W/ TWL XL LVL3 (GOWN DISPOSABLE) ×2 IMPLANT
GOWN STRL REUS W/TWL XL LVL3 (GOWN DISPOSABLE) ×2
GRADUATE 1200CC STRL 31836 (MISCELLANEOUS) ×2 IMPLANT
INTRAOP MONITOR FEE IMPULS NCS (MISCELLANEOUS) ×1
INTRAOP MONITOR FEE IMPULSE (MISCELLANEOUS) ×1
IV CATH ANGIO 12GX3 LT BLUE (NEEDLE) ×2 IMPLANT
KIT TURNOVER KIT A (KITS) ×2 IMPLANT
MARKER SKIN DUAL TIP RULER LAB (MISCELLANEOUS) ×4 IMPLANT
NEEDLE HYPO 22GX1.5 SAFETY (NEEDLE) ×2 IMPLANT
NEEDLE SPNL 22GX3.5 QUINCKE BK (NEEDLE) ×2 IMPLANT
NS IRRIG 1000ML POUR BTL (IV SOLUTION) ×2 IMPLANT
PACK LAMINECTOMY NEURO (CUSTOM PROCEDURE TRAY) ×2 IMPLANT
PAD ARMBOARD 7.5X6 YLW CONV (MISCELLANEOUS) ×2 IMPLANT
PIN CASPAR 14 (PIN) ×1 IMPLANT
PIN CASPAR 14MM (PIN) ×2
PLATE ACP 1-LEVEL 1.6V20 (Plate) ×2 IMPLANT
SCREW ACP VA SD 3.5X15 (Screw) ×8 IMPLANT
SPOGE SURGIFLO 8M (HEMOSTASIS) ×1
SPONGE KITTNER 5P (MISCELLANEOUS) ×4 IMPLANT
SPONGE SURGIFLO 8M (HEMOSTASIS) ×1 IMPLANT
SUT POLYSORB 2-0 5X18 GS-10 (SUTURE) ×4 IMPLANT
SUT VICRYL 2-0 SH 8X27 (SUTURE) ×2 IMPLANT
SUT VICRYL 3-0 CR8 SH (SUTURE) ×2 IMPLANT
SYR 30ML LL (SYRINGE) ×2 IMPLANT
TAPE CLOTH 3X10 WHT NS LF (GAUZE/BANDAGES/DRESSINGS) ×2 IMPLANT
TOWEL OR 17X26 4PK STRL BLUE (TOWEL DISPOSABLE) ×4 IMPLANT
TRAY FOLEY MTR SLVR 16FR STAT (SET/KITS/TRAYS/PACK) IMPLANT
TUBING CONNECTING 10 (TUBING) ×2 IMPLANT

## 2020-01-29 NOTE — Op Note (Signed)
Operative Note 01/29/2020  PRE-OP DIAGNOSIS: Cervical  myelopathy  POST-OP DIAGNOSIS:Post-Op Diagnosis Codes: Cervical  myelopathy  Procedure(s): ARTHRODESIS, ANTERIOR INTERBODY, INCLUDING DISC SPACE PREPARATION, DISCECTOMY, OSTEOPHYTECTOMY AND DECOMPRESSION OF SPINAL CORD AND/ORNERVE ROOTS; CERVICAL BELOW C2--C3/4ACDF ARTHRODESIS ANTERIOR INTERBODY W/DISCECTOMY ADDITIONAL FIFTH ANTERIOR INSTRUMENTATION; 2 TO 3 VERTEBRAL SEGMENTS (LIST IN ADDITION TO PRIMARY PROCEDURE) ALLOGRAFT, STRUCTURAL, FOR SPINE SURGERY ONLY (LIST IN ADDITION TO PRIMARY PROCEDURE)   SURGEON: Surgeon(s) and Role: * Nathaniel Man, MD - Primary  Christine Zdeb,PA, Asisstant  ANESTHESIA:General   OPERATIVE FINDINGS:  C3-4 stenosis with disc protrusion  OPERATIVE REPORT:   Indications Roy [name] presented to the ED on 10/4  with ongoing and progressive weakness over the past year.  MRI did reveal severe lumbar stenosis but MRI of the cervical spine also revealed severe stenosis at C3-4 with spinal cord compression. Given this, we recommended an anterior cervical decompression and fusion.The risks of hematoma, infection, poor bone healing and failure of fusion, cord injury, weakness, numbness, neck pain, stroke,difficulty swallowingand death were discussed in detail. All questions were answered and the patient elected to proceed with the surgery.  Procedure After obtaining informed consent, the patient was taken to the Operating Room where general anesthesia was induced and the patient intubated. Vascular access was obtained. Decadron was administered. The head was slightly extended and imaging used to identify a skin crease overlying the C4vertebral body.Appropriate padding was performed. Neuromonitoring electrodes had been placed and baseline MEPs and SSEPS were found.  The patient was prepped and draped in the usual sterile fashion and a timeout was performed per protocol.  Local anesthesia was instilled with epinephrine along the planned incision site. A transverse cervicalincision was performed on the left in a skin crease. The incision was carried to the level of the platysma and then cautery was used to incise the muscle. Blunt dissection was used to expand the plane and the dissection was carried deep medial to the SCM and carotid sheath being careful to identify the trachea and esophagus medially.The prevertebral fascia was dissected bluntly and the disc space visualized. A marker was placed and fluoroscopy confirmed the C3/4disc level.   Next, cautery was used to undermine the longus colli muscles bilaterally and identify theC3/4 disc space. Caspar pins were placed at C3 and C4and a retractor system placed under the muscles to complete the exposure.The anterior osteophyte was removed. Next, a combination of curettes and Kerrison rongeurs were used to remove theremainder of theanterior osteophyte at C3/4and then the disc material. A 29mm matchstick was used to shave the endplates of the adjacent bodies. A trial spacer was used to size the graft and then hemostasis obtained. The microscope was brought into the field for the remainder of the surgery. The matchsitickdrill bitwas used to remove the osteophyte/disc complex deep to the level of the PLL at C3-4.Marland Kitchen There wassignificantdisc protrusion at this level that was removed with rongeurs. The PLL was entered with a hook and then the PLL was removed along with remaining disc material to decompress centrally and then out into bilateral neuro foramen.  The dura appeared decompressed at this point. Floseal was used for hemostasis. Allograft was placed,62mm in height and placed slightly recessed to theanterior edge of vertebral bodyto promote arthrodesis.  The caspar pins were removed and bone wax placed. The remainder of the osteophytes were removedto allow for plating. Next, a87mm plate was found to be the  adequate size and was placed in the midline and secured with two 19mm screws at each  bone level. Xray was obtained confirming good graft placement and adequate depth of screws. The retractors were removed. The wound was irrigated copiously and hemostasis obtained. The platysma was closed with 2-0 vicryl suture. The dermis was closed with 3-0 Vicryl and Dermabond was placed on the skin.  The patient had general anesthesia reversed and was extubated following the procedure. He awoke following commands with symmetric movement. He was taken to the PACU where he continued his recovery.  ESTIMATED BLOOD LOSS:50cc   SPECIMENS:None    IMPLANT ALLOGRAFT LORDOTIC [id number] - [id number]  Inventory Item: ALLOGRAFT LORDOTIC [id number] Serial no.: [id number] Model/Cat no.: [id number]  Implant name: ALLOGRAFT LORDOTIC [id number] - [id number] Laterality: N/A Area: Cervical Level 3-4  Manufacturer: [employer] Date of Manufacture:    Action: Implanted Number Used: 1   Device Identifier:  Device Identifier Type:    PLATE ACP 1-LEVEL 1.6V20 - [id number]  Inventory Item: PLATE ACP 1-LEVEL 1.6V20 Serial no.:  Model/Cat no.: [id number]  Implant name: PLATE ACP 1-LEVEL 1.6V20 - [id number] Laterality: N/A Area: Cervical Level 3-4  Manufacturer: [employer] Date of Manufacture:    Action: Implanted Number Used: 1   Device Identifier:  Device Identifier Type:    SCREW ACP VA SD 3.5X15 - [id number]  Inventory Item: SCREW ACP VA SD 3.5X15 Serial no.:  Model/Cat no.: [id number]  Implant name: SCREW ACP VA SD 3.5X15 - [id number] Laterality: N/A Area: Cervical Level 3-4  Manufacturer: [employer] Date of Manufacture:    Action: Implanted Number Used: 4   Device Identifier:  Device Identifier Type:        ATTESTATION: I performed the procedure in its entirety with the assistance ofChristine Zdeb, physician assistant  [name], MD

## 2020-01-29 NOTE — Interval H&P Note (Signed)
History and Physical Interval Note:  01/29/2020 2:11 PM  Roy [name].  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  The various methods of treatment have been discussed with the patient and family. After consideration of risks, benefits and other options for treatment, the patient has consented to  Procedure(s): ANTERIOR CERVICAL DECOMPRESSION/DISCECTOMY FUSION 1 LEVEL C3/4 (N/A) as a surgical intervention.  The patient's history has been reviewed, patient examined, no change in status, stable for surgery.  I have reviewed the patient's chart and labs.  Questions were answered to the patient's satisfaction.     [name]

## 2020-01-29 NOTE — Anesthesia Postprocedure Evaluation (Signed)
Anesthesia Post Note  Patient: Roy [name].  Procedure(s) Performed: ANTERIOR CERVICAL DECOMPRESSION/DISCECTOMY FUSION 1 LEVEL C3/4 (N/A )  Patient location during evaluation: PACU Anesthesia Type: General Level of consciousness: awake and alert Pain management: pain level controlled Vital Signs Assessment: post-procedure vital signs reviewed and stable Respiratory status: spontaneous breathing and respiratory function stable Cardiovascular status: stable Anesthetic complications: no   No complications documented.   Last Vitals:  Vitals:   01/29/20 1800 01/29/20 1815  BP: 126/73 126/73  Pulse: 85 80  Resp: 20 17  Temp:    SpO2: 99% 93%    Last Pain:  Vitals:   01/29/20 1815  TempSrc:   PainSc: 0-No pain                 Citlali Gautney K

## 2020-01-29 NOTE — Transfer of Care (Signed)
Immediate Anesthesia Transfer of Care Note  Patient: Roy [name].  Procedure(s) Performed: ANTERIOR CERVICAL DECOMPRESSION/DISCECTOMY FUSION 1 LEVEL C3/4 (N/A )  Patient Location: PACU  Anesthesia Type:General  Level of Consciousness: drowsy and patient cooperative  Airway & Oxygen Therapy: Patient Spontanous Breathing and Patient connected to face mask oxygen  Post-op Assessment: Report given to RN and Post -op Vital signs reviewed and stable  Post vital signs: Reviewed and stable  Last Vitals:  Vitals Value Taken Time  BP 119/62 01/29/20 1744  Temp    Pulse 83 01/29/20 1751  Resp 21 01/29/20 1751  SpO2 99 % 01/29/20 1751  Vitals shown include unvalidated device data.  Last Pain:  Vitals:   01/29/20 1325  TempSrc: Oral  PainSc: 5          Complications: No complications documented.

## 2020-01-29 NOTE — Anesthesia Preprocedure Evaluation (Addendum)
Anesthesia Evaluation  Patient identified by MRN, date of birth, ID band Patient awake    Reviewed: Allergy & Precautions, H&P , NPO status , Patient's Chart, lab work & pertinent test results  History of Anesthesia Complications Negative for: history of anesthetic complications  Airway Mallampati: III  TM Distance: <3 FB Neck ROM: limited    Dental  (+) Chipped, Poor Dentition, Missing   Pulmonary neg pulmonary ROS, neg shortness of breath,    Pulmonary exam normal        Cardiovascular Exercise Tolerance: Good hypertension, (-) angina+ CAD and +CHF  (-) Past MI Normal cardiovascular exam     Neuro/Psych negative neurological ROS  negative psych ROS   GI/Hepatic negative GI ROS, Neg liver ROS, neg GERD  ,  Endo/Other  diabetes, Type 2  Renal/GU      Musculoskeletal   Abdominal   Peds  Hematology negative hematology ROS (+)   Anesthesia Other Findings Blind in left eye  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   Past Medical History: No date: CHF (congestive heart failure) (HCC) No date: Coronary artery disease No date: Diabetes mellitus without complication (HCC) No date: Glaucoma No date: HLD (hyperlipidemia) No date: Hypertension No date: Non-ischemic cardiomyopathy (HCC)  Past Surgical History: No date: arm surgery; Right     Comment:  4x surgery as a child, cut arm falling through glass               window  No date: HYDROCELE EXCISION / REPAIR No date: REPLACEMENT TOTAL KNEE; Right  BMI    Body Mass Index: 30.99 kg/m      Reproductive/Obstetrics negative OB ROS                           Anesthesia Physical Anesthesia Plan  ASA: III  Anesthesia Plan: General ETT   Post-op Pain Management:    Induction: Intravenous  PONV Risk Score and Plan: Ondansetron, Dexamethasone, Midazolam and Treatment may vary due to age or medical  condition  Airway Management Planned: Oral ETT and Video Laryngoscope Planned  Additional Equipment: Arterial line  Intra-op Plan:   Post-operative Plan: Extubation in OR  Informed Consent: I have reviewed the patients History and Physical, chart, labs and discussed the procedure including the risks, benefits and alternatives for the proposed anesthesia with the patient or authorized representative who has indicated his/her understanding and acceptance.     Dental Advisory Given  Plan Discussed with: Anesthesiologist, CRNA and Surgeon  Anesthesia Plan Comments: (Patient consented for risks of anesthesia including but not limited to:  - adverse reactions to medications - damage to eyes, teeth, lips or other oral mucosa - nerve damage due to positioning  - sore throat or hoarseness - Damage to heart, brain, nerves, lungs, other parts of body or loss of life  Patient voiced understanding.)        Anesthesia Quick Evaluation

## 2020-01-29 NOTE — Anesthesia Procedure Notes (Signed)
Procedure Name: Intubation Date/Time: 01/29/2020 2:41 PM Performed by: Lia Foyer, CRNA Pre-anesthesia Checklist: Patient identified, Emergency Drugs available, Suction available and Patient being monitored Patient Re-evaluated:Patient Re-evaluated prior to induction Oxygen Delivery Method: Circle system utilized Preoxygenation: Pre-oxygenation with 100% oxygen Induction Type: IV induction Ventilation: Mask ventilation without difficulty Laryngoscope Size: McGraph and 3 Grade View: Grade I Tube type: Oral Tube size: 7.5 mm Number of attempts: 1 Airway Equipment and Method: Stylet,  Oral airway and LTA kit utilized Placement Confirmation: ETT inserted through vocal cords under direct vision,  positive ETCO2 and breath sounds checked- equal and bilateral Secured at: 24 cm Tube secured with: Tape Dental Injury: Teeth and Oropharynx as per pre-operative assessment

## 2020-01-30 ENCOUNTER — Encounter: Payer: Self-pay | Admitting: Neurosurgery

## 2020-01-30 ENCOUNTER — Observation Stay: Payer: Medicare HMO

## 2020-01-30 DIAGNOSIS — I5032 Chronic diastolic (congestive) heart failure: Secondary | ICD-10-CM | POA: Diagnosis present

## 2020-01-30 DIAGNOSIS — Z794 Long term (current) use of insulin: Secondary | ICD-10-CM

## 2020-01-30 DIAGNOSIS — I10 Essential (primary) hypertension: Secondary | ICD-10-CM | POA: Diagnosis present

## 2020-01-30 DIAGNOSIS — Z981 Arthrodesis status: Secondary | ICD-10-CM | POA: Diagnosis not present

## 2020-01-30 DIAGNOSIS — E785 Hyperlipidemia, unspecified: Secondary | ICD-10-CM | POA: Diagnosis present

## 2020-01-30 DIAGNOSIS — E119 Type 2 diabetes mellitus without complications: Secondary | ICD-10-CM

## 2020-01-30 LAB — GLUCOSE, CAPILLARY
Glucose-Capillary: 193 mg/dL — ABNORMAL HIGH (ref 70–99)
Glucose-Capillary: 190 mg/dL — ABNORMAL HIGH (ref 70–99)
Glucose-Capillary: 224 mg/dL — ABNORMAL HIGH (ref 70–99)
Glucose-Capillary: 254 mg/dL — ABNORMAL HIGH (ref 70–99)

## 2020-01-30 IMAGING — CR DG CERVICAL SPINE 2 OR 3 VIEWS
2 series · 2 of 2 positions shown · non-contrast
Comparison: Intraoperative cervical images [DATE]

CLINICAL DATA: Status post fusion

EXAM:
CERVICAL SPINE - 2-3 VIEW

[c-spine lat]
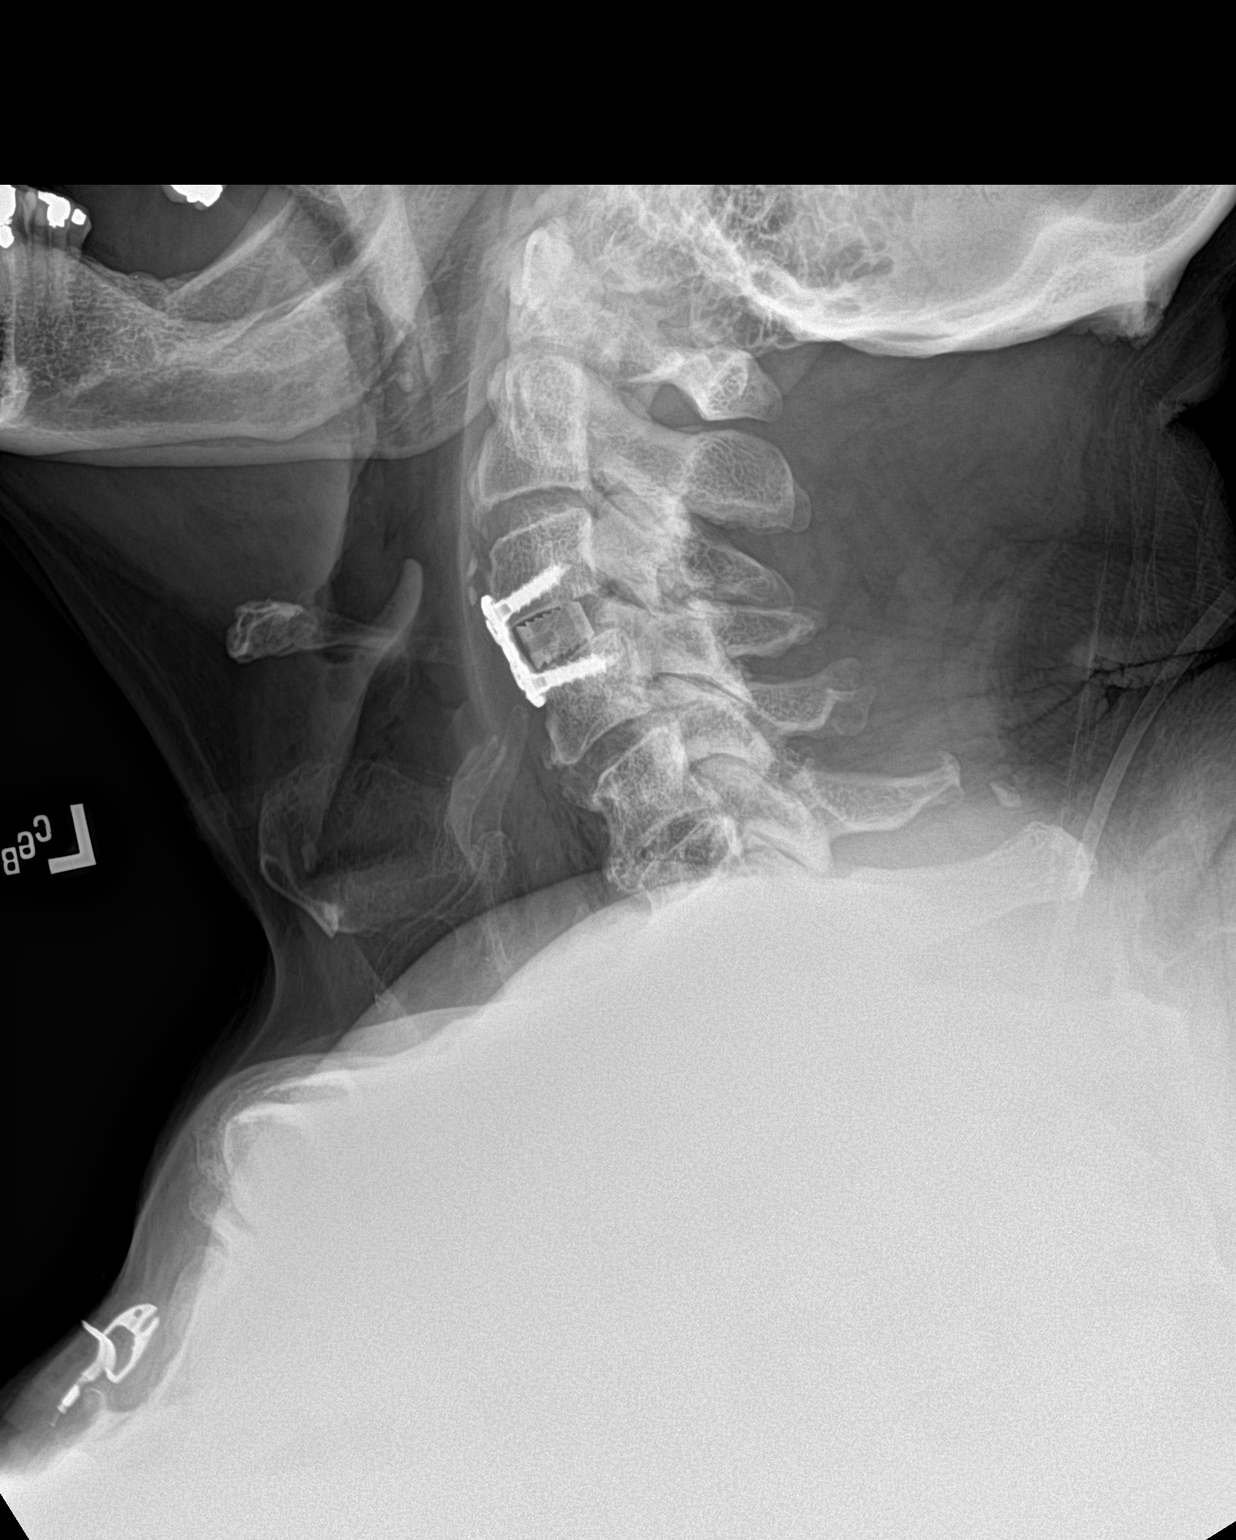

[c-spine ap]
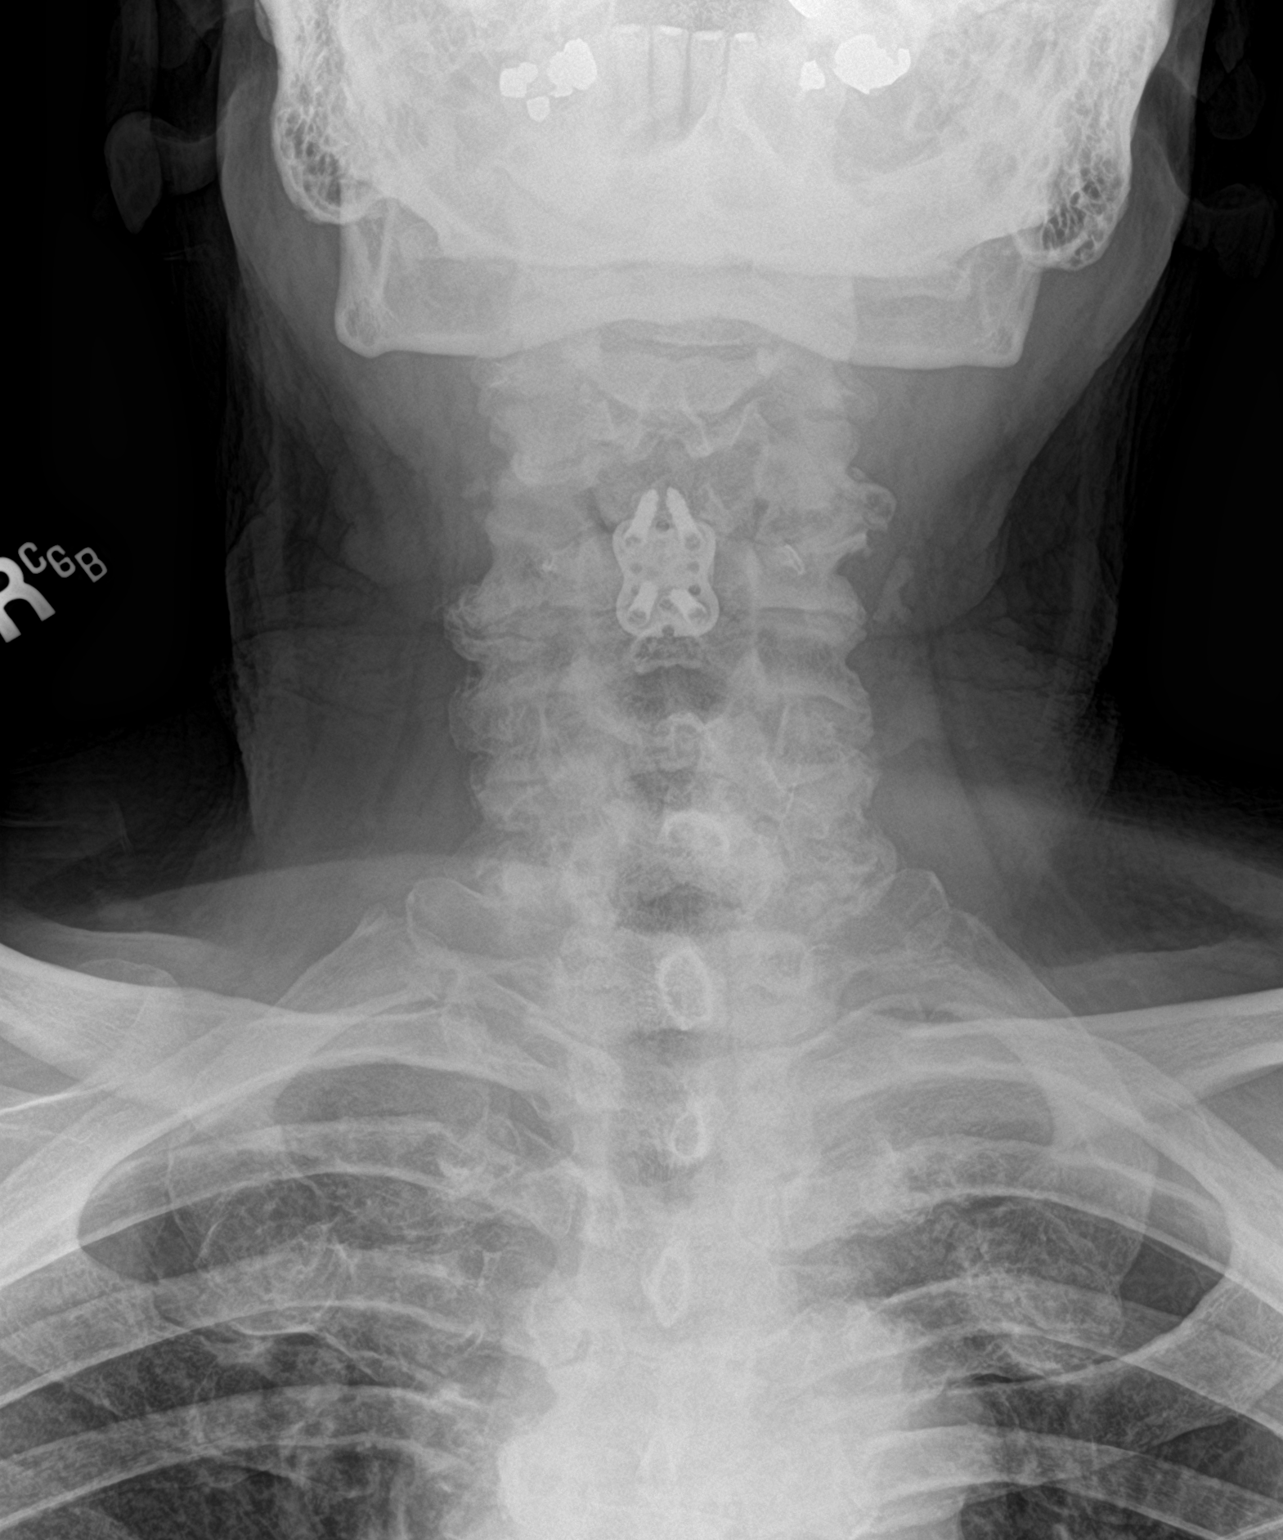

[2 of 2 positions shown; findings below may reference images not displayed]

FINDINGS: Frontal and lateral views were obtained. Patient is status post
anterior screw and plate fixation at C3 and C4 with disc spacer at
C3-4. Support hardware intact. No fracture or spondylolisthesis. The
prevertebral soft tissues and predental space regions are normal.
There is moderately severe disc space narrowing at C5-6 and C6-7.
Other disc spaces appear unremarkable. Lung apices are clear. There
is calcification in each carotid artery.
IMPRESSION: Postoperative anterior screw and plate fixation at C3 and C4 with
disc spacer at C3-4. Support hardware intact. Arthropathy noted at
C5-6 and C6-7. No fracture or spondylolisthesis. There is
calcification in each carotid artery.

## 2020-01-30 MED ORDER — CHLORHEXIDINE GLUCONATE CLOTH 2 % EX PADS
6.0000 | MEDICATED_PAD | Freq: Every day | CUTANEOUS | Status: DC
  Administered 2020-01-30 – 2020-02-15 (×17): 6 via TOPICAL

## 2020-01-30 MED ORDER — ENOXAPARIN SODIUM 40 MG/0.4ML ~~LOC~~ SOLN
40.0000 mg | SUBCUTANEOUS | Status: DC
  Administered 2020-01-30 – 2020-02-14 (×16): 40 mg via SUBCUTANEOUS
  Filled 2020-01-30 (×16): qty 0.4

## 2020-01-30 MED ORDER — INSULIN DETEMIR 100 UNIT/ML ~~LOC~~ SOLN
5.0000 [IU] | Freq: Every day | SUBCUTANEOUS | Status: DC
  Administered 2020-01-30: 5 [IU] via SUBCUTANEOUS
  Filled 2020-01-30 (×2): qty 0.05

## 2020-01-30 NOTE — Progress Notes (Signed)
Procedure: C3-4 ACDF Procedure date: 01/29/2020 Diagnosis: cervi al radiculopathy    History: Roy [name]. is s/p C3-4 ACDF POD0: Tolerated procedure well. Evaluated in post op recovery still disoriented from anesthesia but able to answer questions and obey commands.   Physical Exam: Vitals:   01/30/20 0755 01/30/20 1106  BP: (!) 146/81 115/65  Pulse: 69 (!) 59  Resp: 18 18  Temp: 98 F (36.7 C) 98 F (36.7 C)  SpO2: 98% 99%    General: alert and orietned, lying in bed Strength:5/5 throughout  Sensation: intact and symmetric throughout  Skin: Incision C/D/I, no evidence of hematoma noted  Data:  No results for input(s): NA, K, CL, CO2, BUN, CREATININE, LABGLOM, GLUCOSE, CALCIUM in the last 168 hours. No results for input(s): AST, ALT, ALKPHOS in the last 168 hours.  Invalid input(s): TBILI   Recent Labs  Lab 01/25/20 0527  WBC 7.8  HGB 13.4  HCT 40.4  PLT 309   Recent Labs  Lab 01/25/20 1033  APTT 29  INR 1.0         Assessment/Plan:  Roy [name]. is POD0 s/p ACDF C3-4.. Will continue to monitor   #ACDF C3-4 - mobilize - pain control - PTOT - Imaging  #DVT Prophylaxis - DVT prophylaxis Lovenox 40mg  subq qd - SCDs - Ambulation  ##DM2, CHF, CAD - Continue home medications, appreciate medical team assistance with management of comorbid conditions   , NP Department of Neurosurgery

## 2020-01-30 NOTE — Evaluation (Signed)
Physical Therapy Evaluation Patient Details Name: Roy [name]. MRN: [id number] DOB: [birth year] Today's Date: 01/30/2020   History of Present Illness  Roy [name] is a 74 y.o. male who presented to the [facility] emergency department on 01/14/20 with concern for worsening weakness in his lower extremities.  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 He DC'd briefly to SNF to await sx, and is now s/p C 3-4 anterior cervical decompression/discectomy. PMH significant for chronic diastolic heart failure, HTN, HLD, DM2, GERD, and left eye blindness.  Clinical Impression  Pt lying in bed on bedpan with wife at bedside and agreeable to participate in PT evaluation. Pt required min A for rolling to L for bedpan removal with pericare provided by clinician. Pt performed sidelying <> sit transfer with max+2 A for truncal management into sitting/on descent and for BLE elevation onto bed. In sitting, pt with posterior loss of balance requiring max A to correct. Pt with limited UE ROM and strength. Clinician donned bilateral knee braces on patient which wife reports is for prevention of hyperextension. Pt unable to come into full upright standing on first attempt despite slight increase in bed height. Increased height of bed some more. Pt required bilateral knee blocking and able to come into standing with max+2 A. Verbal cues for hip and trunk extension for improved upright stance with bilateral hands supported on RW. Pt able to tolerate standing for ~1 minute before L knee buckled and required max+2 A for controlled lowering to bed. Pt educated on cervical movement restrictions however pt with difficulty maintaining them throughout session despite verbal and tactile cues. Pt presents with deficits in balance, strength, and functional mobility. Pt was ambulatory about 3 weeks ago and presents today below baseline of function. Pt is very motivated to get back to  better function and remained enthusiastic and fully participatory throughout session. Pt would benefit from skilled PT to address current deficits and CIR at discharge from acute stay to maximize functional mobility and return to PLOF.  This entire session was guided, instructed, and directly supervised by Elizabeth Palau, DPT.      Follow Up Recommendations CIR    Equipment Recommendations  Other (comment) (pt has equipment)    Recommendations for Other Services       Precautions / Restrictions Precautions Precautions: Fall;Cervical Precaution Booklet Issued: Yes (comment) Required Braces or Orthoses: Other Brace Other Brace: pt has personal B knee braces he brought from home that prevent hyperextension Restrictions Weight Bearing Restrictions: No Other Position/Activity Restrictions: B shoulder pain/weakness limting UE assist.      Mobility  Bed Mobility Overal bed mobility: Needs Assistance Bed Mobility: Rolling;Sit to Supine;Sidelying to Sit Rolling: Min assist Sidelying to sit: Max assist;+2 for physical assistance   Sit to supine: Max assist;+2 for physical assistance Sit to sidelying: Max assist;+2 for physical assistance General bed mobility comments: Pt able to roll into sidelying with min A for completion of movement; verbal cues for cervical and BUE/BLE positioning; max+2 A for sidelying <> sit for truncal management elevation/controlled lowering and BLE elevation onto bed    Transfers Overall transfer level: Needs assistance Equipment used: Rolling walker (2 wheeled) Transfers: Sit to/from Stand Sit to Stand: Max assist;+2 physical assistance         General transfer comment: Bilateral knees blocked and pt required 2 attempts to achieve standing with max+2 A.   Ambulation/Gait  General Gait Details: not attempted  Stairs            Wheelchair Mobility    Modified Rankin (Stroke Patients Only)       Balance Overall balance  assessment: Needs assistance Sitting-balance support: Feet supported;Bilateral upper extremity supported Sitting balance-Leahy Scale: Fair Sitting balance - Comments: SBA for static balance however required max A for posterior LOB with pt unable to correct   Standing balance support: Bilateral upper extremity supported;During functional activity Standing balance-Leahy Scale: Poor Standing balance comment: Requires BUE support on RW, bilateral knee blocking, and +2 assist to maintain standing balance                             Pertinent Vitals/Pain Pain Assessment: Faces Faces Pain Scale: Hurts a little bit Pain Location: back, with pt reaching for L side Pain Descriptors / Indicators: Cramping Pain Intervention(s): Monitored during session    Home Living Family/patient expects to be discharged to:: Inpatient rehab Living Arrangements: Spouse/significant other Available Help at Discharge: Family Type of Home: House Home Access: Stairs to enter Entrance Stairs-Rails: Right (at garage steps) Entrance Stairs-Number of Steps: 7 in front, 4 in the garage Home Layout: One level Home Equipment: Walker - 2 wheels;Walker - 4 wheels;Grab bars - tub/shower;Shower seat      Prior Function Level of Independence: Needs assistance   Gait / Transfers Assistance Needed: able to ambulate short distances with RW at home at baseline.  ADL's / Homemaking Assistance Needed: Pt/caregiver report, pt requires assist with most BADL management at baseline. He has difficulty with self-feeding, bathing, and dressing. He has been generally bed level for ADL management since fall this month.  Comments: Wife reports that pt was ambulatory about 3 weeks ago and was unable to stand from toilet at home and she was unable to get him up physically.     Hand Dominance   Dominant Hand: Left    Extremity/Trunk Assessment   Upper Extremity Assessment Upper Extremity Assessment: Generalized  weakness;Defer to OT evaluation (noted decreased BUE ROM and strength bilaterally) LUE Deficits / Details: Pt endorses LUE sensation is improved since his sx, however, it remains weak with poor shoulder flexion/abduction. AROM limited to ~80-90 degrees strength poor grossly 2/5 as well as decreased FMC and edema in L hand. LUE Sensation: history of peripheral neuropathy LUE Coordination: decreased fine motor;decreased gross motor    Lower Extremity Assessment Lower Extremity Assessment: Generalized weakness (grossly 3- to 3+/5 bilaterally)    Cervical / Trunk Assessment Cervical / Trunk Assessment: Kyphotic (Foreward head and shoulders)  Communication   Communication: No difficulties  Cognition Arousal/Alertness: Awake/alert Behavior During Therapy: WFL for tasks assessed/performed Overall Cognitive Status: Within Functional Limits for tasks assessed                                 General Comments: A&O x 4      General Comments      Exercises Other Exercises Other Exercises: Pt/caregiver (spouse) educated on falls prevention strategies, safe use of AE/DME for ADL management including use of built-up handle to support self-feeding, cervical precautions (handout provided), and considerations for ADL management with cervical precautions. Other Exercises: Pt instructed on cervical precautions and demonstrates difficulty maintaining requiring continued verbal and tactile cues. Other Exercises: Additonal time taken to remove pt from bedpan and perform pericare.   Assessment/Plan  PT Assessment Patient needs continued PT services  PT Problem List Decreased strength;Decreased range of motion;Decreased activity tolerance;Decreased balance;Decreased mobility;Decreased coordination;Decreased knowledge of precautions       PT Treatment Interventions DME instruction;Gait training;Stair training;Functional mobility training;Therapeutic activities;Therapeutic exercise;Balance  training;Patient/family education    PT Goals (Current goals can be found in the Care Plan section)  Acute Rehab PT Goals Patient Stated Goal: to get my strength back PT Goal Formulation: With patient/family Time For Goal Achievement: 02/13/20 Potential to Achieve Goals: Fair    Frequency 7X/week   Barriers to discharge Inaccessible home environment;Decreased caregiver support      Co-evaluation               AM-PAC PT "6 Clicks" Mobility  Outcome Measure Help needed turning from your back to your side while in a flat bed without using bedrails?: A Little Help needed moving from lying on your back to sitting on the side of a flat bed without using bedrails?: A Lot Help needed moving to and from a bed to a chair (including a wheelchair)?: A Lot Help needed standing up from a chair using your arms (e.g., wheelchair or bedside chair)?: A Lot Help needed to walk in hospital room?: Total Help needed climbing 3-5 steps with a railing? : Total 6 Click Score: 11    End of Session Equipment Utilized During Treatment: Gait belt Activity Tolerance: Patient tolerated treatment well Patient left: in bed;Other (comment) (with OT present) Nurse Communication: Mobility status PT Visit Diagnosis: Unsteadiness on feet (R26.81);Other abnormalities of gait and mobility (R26.89);Repeated falls (R29.6);Muscle weakness (generalized) (M62.81);History of falling (Z91.81);Difficulty in walking, not elsewhere classified (R26.2);Other symptoms and signs involving the nervous system (R29.898)    Time: 1020-1053 PT Time Calculation (min) (ACUTE ONLY): 33 min   Charges:              Frederich Chick, SPT  Frederich Chick 01/30/2020, 12:47 PM

## 2020-01-30 NOTE — Progress Notes (Signed)
PROGRESS NOTE    Roy [name].  UUE:[id number] DOB: 1946/03/23 DOA: 01/29/2020 PCP: Rigoberto Noel, MD  Brief Narrative:  Consulted by: Dr. Adriana Simas of neurosurgery and Autumn Messing (neurosurgery NP);  Reason for Consult: assistance with post-op management of several chronic medical problems, including type 2 diabetes mellitus, hypertension, and chronic diastolic heart failure.   Patient seen and examined.  Wife at bedside.  Blood pressure and blood sugar reasonably well controlled over interval.  [facility] hospitalist service to continue following this patient with you   Assessment & Plan:   Principal Problem:   S/P cervical spinal fusion Active Problems:   Hypertension   Diabetes mellitus without complication (HCC)   HLD (hyperlipidemia)   Chronic diastolic CHF (congestive heart failure) (HCC)  Chronic diastolic heart failure Most recent echocardiogram was performed on 01/15/2020 showed evidence of mild LVH, EF 60 to 65%, no evidence of focal wall motion abnormality, and showed grade 1 diastolic dysfunction.   Not on any diuretics as an outpatient.   No evidence of acutely decompensated heart failure at this time.  Plan: Monitor strict I's and O's and daily weights.   Type 2 diabetes mellitus  Most recent hemoglobin A1c 5.8% on 01/16/2020.   On Metformin as an outpatient as well as the following basal and short-acting insulin regimen: Levemir 12 units subcu nightly as well as sliding scale NovoLog 3 times daily with meals, typically resulting in 4-8 units of NovoLog per meal.  -Wife is a retired Scientist, clinical (histocompatibility and immunogenetics) so keeps close monitoring of his blood pressure and blood sugar as outpatient  Plan: Continue sliding scale, moderate intensity Add Levemir 5 units nightly Hold Metformin Carb modified diet  Hyperlipidemia  On simvastatin at home.  Plan: Continue home statin.  Essential hypertension  Amlodipine and Coreg outpatient regimen Per outpatient cardiology some  consideration for lisinopril addition Postoperative blood pressures appear to be normotensive.  Plan: Continue amlodipine 5 mg daily Continue carvedilol 6.25 mg twice daily Monitor blood pressure per unit protocol Consider addition of lisinopril if needed yes   DVT prophylaxis: SCDs Code Status: Full Family Communication: Wife at bedside Disposition Plan: Per primary neurosurgical team.  Hospitalist on consult   Consultants:   [facility] hospitalist  Procedures:   Anterior cervical decompression discectomy, 01/29/2020  Antimicrobials:   None   Subjective: Seen and examined.  Wife at bedside.  No pain complaints.  Objective: Vitals:   01/30/20 0001 01/30/20 0402 01/30/20 0755 01/30/20 1106  BP: 122/70 124/67 (!) 146/81 115/65  Pulse: 77 75 69 (!) 59  Resp: 16 15 18 18   Temp: 97.6 F (36.4 C) 98 F (36.7 C) 98 F (36.7 C) 98 F (36.7 C)  TempSrc: Oral Oral Oral Oral  SpO2: 95% 94% 98% 99%  Weight:      Height:        Intake/Output Summary (Last 24 hours) at 01/30/2020 1113 Last data filed at 01/30/2020 1035 Gross per 24 hour  Intake 1776.81 ml  Output 465 ml  Net 1311.81 ml   Filed Weights   01/29/20 1325  Weight: 115.5 kg    Examination:  General exam: Appears calm and comfortable  Respiratory system: Clear to auscultation. Respiratory effort normal. Cardiovascular system: S1 & S2 heard, RRR. No JVD, murmurs, rubs, gallops or clicks. No pedal edema. Gastrointestinal system: Abdomen is nondistended, soft and nontender. No organomegaly or masses felt. Normal bowel sounds heard. Central nervous system: Alert and oriented. No focal neurological deficits. Extremities: Symmetric 5 x 5 power. Skin:  No rashes, lesions or ulcers Psychiatry: Judgement and insight appear normal. Mood & affect appropriate.     Data Reviewed: I have personally reviewed following labs and imaging studies  CBC: Recent Labs  Lab 01/25/20 0527  WBC 7.8  HGB 13.4  HCT 40.4   MCV 94.8  PLT 309   Basic Metabolic Panel: No results for input(s): NA, K, CL, CO2, GLUCOSE, BUN, CREATININE, CALCIUM, MG, PHOS in the last 168 hours. GFR: Estimated Creatinine Clearance: 97.9 mL/min (by C-G formula based on SCr of 0.92 mg/dL). Liver Function Tests: No results for input(s): AST, ALT, ALKPHOS, BILITOT, PROT, ALBUMIN in the last 168 hours. No results for input(s): LIPASE, AMYLASE in the last 168 hours. No results for input(s): AMMONIA in the last 168 hours. Coagulation Profile: Recent Labs  Lab 01/25/20 1033  INR 1.0   Cardiac Enzymes: No results for input(s): CKTOTAL, CKMB, CKMBINDEX, TROPONINI in the last 168 hours. BNP (last 3 results) No results for input(s): PROBNP in the last 8760 hours. HbA1C: No results for input(s): HGBA1C in the last 72 hours. CBG: Recent Labs  Lab 01/25/20 1301 01/29/20 1347 01/29/20 1754 01/29/20 2114 01/30/20 0755  GLUCAP 231* 155* 193* 220* 193*   Lipid Profile: No results for input(s): CHOL, HDL, LDLCALC, TRIG, CHOLHDL, LDLDIRECT in the last 72 hours. Thyroid Function Tests: No results for input(s): TSH, T4TOTAL, FREET4, T3FREE, THYROIDAB in the last 72 hours. Anemia Panel: No results for input(s): VITAMINB12, FOLATE, FERRITIN, TIBC, IRON, RETICCTPCT in the last 72 hours. Sepsis Labs: No results for input(s): PROCALCITON, LATICACIDVEN in the last 168 hours.  Recent Results (from the past 240 hour(s))  Respiratory Panel by RT PCR (Flu A&B, Covid) - Nasopharyngeal Swab     Status: None   Collection Time: 01/25/20 10:33 AM   Specimen: Nasopharyngeal Swab  Result Value Ref Range Status   SARS Coronavirus 2 by RT PCR NEGATIVE NEGATIVE Final    Comment: (NOTE) SARS-CoV-2 target nucleic acids are NOT DETECTED.  The SARS-CoV-2 RNA is generally detectable in upper respiratoy specimens during the acute phase of infection. The lowest concentration of SARS-CoV-2 viral copies this assay can detect is 131 copies/mL. A negative  result does not preclude SARS-Cov-2 infection and should not be used as the sole basis for treatment or other patient management decisions. A negative result may occur with  improper specimen collection/handling, submission of specimen other than nasopharyngeal swab, presence of viral mutation(s) within the areas targeted by this assay, and inadequate number of viral copies (<131 copies/mL). A negative result must be combined with clinical observations, patient history, and epidemiological information. The expected result is Negative.  Fact Sheet for Patients:  https://www.moore.com/  Fact Sheet for Healthcare Providers:  https://www.young.biz/  This test is no t yet approved or cleared by the [place] FDA and  has been authorized for detection and/or diagnosis of SARS-CoV-2 by FDA under an Emergency Use Authorization (EUA). This EUA will remain  in effect (meaning this test can be used) for the duration of the COVID-19 declaration under Section 564(b)(1) of the Act, 21 U.S.C. section 360bbb-3(b)(1), unless the authorization is terminated or revoked sooner.     Influenza A by PCR NEGATIVE NEGATIVE Final   Influenza B by PCR NEGATIVE NEGATIVE Final    Comment: (NOTE) The Xpert Xpress SARS-CoV-2/FLU/RSV assay is intended as an aid in  the diagnosis of influenza from Nasopharyngeal swab specimens and  should not be used as a sole basis for treatment. Nasal washings and  aspirates  are unacceptable for Xpert Xpress SARS-CoV-2/FLU/RSV  testing.  Fact Sheet for Patients: https://www.moore.com/  Fact Sheet for Healthcare Providers: https://www.young.biz/  This test is not yet approved or cleared by the [place] FDA and  has been authorized for detection and/or diagnosis of SARS-CoV-2 by  FDA under an Emergency Use Authorization (EUA). This EUA will remain  in effect (meaning this test can be used)  for the duration of the  Covid-19 declaration under Section 564(b)(1) of the Act, 21  U.S.C. section 360bbb-3(b)(1), unless the authorization is  terminated or revoked. Performed at [facility], [address] [place], [place] [address]   SARS CORONAVIRUS 2 (TAT 6-24 HRS) Nasopharyngeal Nasopharyngeal Swab     Status: None   Collection Time: 01/28/20  2:52 PM   Specimen: Nasopharyngeal Swab  Result Value Ref Range Status   SARS Coronavirus 2 NEGATIVE NEGATIVE Final    Comment: (NOTE) SARS-CoV-2 target nucleic acids are NOT DETECTED.  The SARS-CoV-2 RNA is generally detectable in upper and lower respiratory specimens during the acute phase of infection. Negative results do not preclude SARS-CoV-2 infection, do not rule out co-infections with other pathogens, and should not be used as the sole basis for treatment or other patient management decisions. Negative results must be combined with clinical observations, patient history, and epidemiological information. The expected result is Negative.  Fact Sheet for Patients: HairSlick.no  Fact Sheet for Healthcare Providers: quierodirigir.com  This test is not yet approved or cleared by the [place] FDA and  has been authorized for detection and/or diagnosis of SARS-CoV-2 by FDA under an Emergency Use Authorization (EUA). This EUA will remain  in effect (meaning this test can be used) for the duration of the COVID-19 declaration under Se ction 564(b)(1) of the Act, 21 U.S.C. section 360bbb-3(b)(1), unless the authorization is terminated or revoked sooner.  Performed at [facility], [address], [place], [place] [address]          Radiology Studies: DG Cervical Spine 2 or 3 views  Result Date: 01/30/2020 CLINICAL DATA:  Status post fusion EXAM: CERVICAL SPINE - 2-3 VIEW COMPARISON:  Intraoperative cervical images January 29, 2020 FINDINGS:  Frontal and lateral views were obtained. Patient is status post anterior screw and plate fixation at C3 and C4 with disc spacer at C3-4. Support hardware intact. No fracture or spondylolisthesis. The prevertebral soft tissues and predental space regions are normal. There is moderately severe disc space narrowing at C5-6 and C6-7. Other disc spaces appear unremarkable. Lung apices are clear. There is calcification in each carotid artery. IMPRESSION: Postoperative anterior screw and plate fixation at C3 and C4 with disc spacer at C3-4. Support hardware intact. Arthropathy noted at C5-6 and C6-7. No fracture or spondylolisthesis. There is calcification in each carotid artery. Electronically Signed   By: Bretta Bang III M.D.   On: 01/30/2020 09:12   DG Cervical Spine 2 or 3 views  Result Date: 01/29/2020 CLINICAL DATA:  Elective surgery EXAM: CERVICAL SPINE - 2-3 VIEW COMPARISON:  None. FINDINGS: Changes of ACDF at C3-4. No hardware complicating feature. Normal alignment. IMPRESSION: No hardware complicating feature. Electronically Signed   By: Charlett Nose M.D.   On: 01/29/2020 17:22        Scheduled Meds: . acetaminophen  1,000 mg Oral Q8H  . amLODipine  5 mg Oral Daily  . brimonidine  1 drop Right Eye BID  . carvedilol  6.25 mg Oral BID  . Chlorhexidine Gluconate Cloth  6  each Topical Daily  . docusate sodium  100 mg Oral BID  . insulin aspart  0-15 Units Subcutaneous TID WC  . latanoprost  1 drop Right Eye QHS  . polyethylene glycol powder  17 g Oral q AM  . senna  2 tablet Oral BID  . simvastatin  20 mg Oral Daily  . sodium chloride flush  3 mL Intravenous Q12H  . tamsulosin  0.4 mg Oral QPC supper   Continuous Infusions: . sodium chloride 50 mL/hr (01/29/20 2219)  . methocarbamol (ROBAXIN) IV       LOS: 1 day    Time spent: 25 minutes    Tresa Moore, MD [facility] Pager [phone]  If 7PM-7AM, please contact night-coverage 01/30/2020, 11:13 AM

## 2020-01-30 NOTE — Progress Notes (Signed)
Inpatient Rehab Admissions Coordinator Note:   Per therapy recommendations, pt was screened for CIR candidacy by Estill Dooms, PT, DPT.  At this time we are recommending a CIR consult.  If pt would like to be considered for [facility], please place an IP Rehab MD Consult order.  Please contact me with questions.   Estill Dooms, PT, DPT [phone] 01/30/20 2:56 PM

## 2020-01-30 NOTE — Plan of Care (Signed)

## 2020-01-30 NOTE — Consult Note (Signed)
History and Physical    PLEASE NOTE THAT DRAGON DICTATION SOFTWARE WAS USED IN THE CONSTRUCTION OF THIS NOTE.   Roy [name]. EHU:[id number] DOB: 24-Feb-1946 DOA: 01/29/2020  PCP: Rigoberto Noel, MD Patient coming from: home   I have personally briefly reviewed patient's old medical records in [facility]   Consulted by: Dr. Adriana Simas of neurosurgery and Autumn Messing (neurosurgery NP);  Reason for Consult: assistance with post-op management of several chronic medical problems, including type 2 diabetes mellitus, hypertension, and chronic diastolic heart failure.    HPI: Roy [name]. is a 74 y.o. male with medical history significant for chronic diastolic heart failure, HTN, HLD, DM2, GERD, severe cervical spinal stenosis, who was to admitted to [facility] on 01/29/2020 by the neurosurgical service for definitive surgical management of associated cervical spinal cord compression.   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 On 01/29/20, consistent with this, he underwent anterior cervical decompression discectomy fusion at the level of C3/C4 performed by Dr. Adriana Simas under general anesthesia. The hospital service was subsequently consulted by the neurosurgical service for assistance with postoperative management of patient's multiple chronic medical problems, which include chronic diastolic heart failure, type 2 diabetes mellitus, and hypertension.  Postoperatively, the patient reports adequate pain control.  He denies any recent chest pain, shortness of breath, palpitations, or diaphoresis.  Per chart review, most recent echocardiogram occurred on 01/15/2020 and showed mild LVH, LVEF 60 to 65%, no evidence of  focal wall motion abnormalities, and grade 1 diastolic dysfunction.  He denies any recent orthopnea, PND, or worsening of peripheral edema and confirms that he is not on any diuretics as an outpatient.   The patient confirms a history of type 2 diabetes mellitus for which heis on basal insulin in the form of Levemir 12 units subcu nightly as well as sliding scale NovoLog 3 times daily with meals, which typically translates to 4-8 units per meal.  Denies any recent hypoglycemic episodes on this regimen.  He is also on Metformin as a sole outpatient oral hypoglycemic agent.  Per chart review, most recent hemoglobin A1c noted to be 5.8% on 01/16/2020.  He also confirms a history of essential hypertension for which he is on amlodipine and Coreg.  Also acknowledges history of BPH for which he is on tamsulosin.    ED Course:  Post-op Vital signs notable for the following: Tmax 98.4; HR 78-89; BP 118/73 - 139/75; RR 15-16; O2 sats 95-97% on RA.   Pre-op Labs were notable for the following: INR 1.0    Review of Systems: As per HPI otherwise 10 point review of systems negative.   Past Medical History:  Diagnosis Date  . CHF (congestive heart failure) (HCC)   . Coronary artery disease   . Diabetes mellitus without complication (HCC)   . Glaucoma   . HLD (hyperlipidemia)   . Hypertension   . Non-ischemic cardiomyopathy [facility])     Past Surgical History:  Procedure Laterality Date  . arm surgery Right    4x surgery as a child, cut arm falling through glass window   . HYDROCELE EXCISION / REPAIR    . REPLACEMENT TOTAL KNEE Right     Social History:  reports that he has never smoked. He  has never used smokeless tobacco. He reports that he does not drink alcohol. No history on file for drug use.   No Known Allergies  Family history reviewed and not pertinent.    Prior to Admission medications   Medication Sig Start Date End Date Taking? Authorizing Provider  amLODipine (NORVASC) 5 MG  tablet Take 5 mg by mouth daily.   Yes [provider]  aspirin 81 MG chewable tablet Chew 81 mg by mouth daily.   Yes [provider]  brimonidine (ALPHAGAN) 0.2 % ophthalmic solution Place 1 drop into the right eye in the morning and at bedtime. 06/04/19 06/03/20 Yes [provider]  carvedilol (COREG) 6.25 MG tablet Take 6.25 mg by mouth 2 (two) times daily. 12/31/19  Yes [provider]  cyanocobalamin 2000 MCG tablet Take 2,000 mcg by mouth daily. 01/24/19  Yes [provider]  famotidine (PEPCID) 20 MG tablet Take 20 mg by mouth at bedtime as needed for heartburn.   Yes [provider]  insulin aspart (NOVOLOG) 100 UNIT/ML injection Inject 4-8 Units into the skin 3 (three) times daily with meals. Sliding scale   Yes [provider]  insulin detemir (LEVEMIR FLEXTOUCH) 100 UNIT/ML FlexPen Inject 12 Units into the skin at bedtime.  09/15/18 04/01/20 Yes [provider]  latanoprost (XALATAN) 0.005 % ophthalmic solution Place 1 drop into the right eye at bedtime. 08/20/19  Yes [provider]  melatonin 5 MG TABS Take 5-10 mg by mouth at bedtime as needed.   Yes [provider]  meloxicam (MOBIC) 7.5 MG tablet Take 7.5 mg by mouth daily. 12/18/19  Yes [provider]  metFORMIN (GLUCOPHAGE-XR) 500 MG 24 hr tablet Take 500 mg by mouth 2 (two) times daily. 12/18/19  Yes [provider]  Multiple Vitamins-Minerals (CENTRUM SILVER 50+MEN PO) Take 1 tablet by mouth daily.   Yes [provider]  polyethylene glycol powder (GLYCOLAX/MIRALAX) 17 GM/SCOOP powder Take 17 g by mouth in the morning.   Yes [provider]  simvastatin (ZOCOR) 20 MG tablet Take 20 mg by mouth daily.   Yes [provider]  tamsulosin (FLOMAX) 0.4 MG CAPS capsule Take 1 capsule (0.4 mg total) by mouth daily after supper. 01/25/20  Yes Wouk, Wilfred Curtis, MD  traZODone (DESYREL) 50 MG tablet Take 25 mg by  mouth at bedtime as needed for sleep.  01/02/20  Yes [provider]     Objective    Physical Exam: Vitals:   01/29/20 2021 01/29/20 2132 01/29/20 2229 01/30/20 0001  BP: 129/73 121/77 139/75 122/70  Pulse: 86 73 89 77  Resp: 15 15 15 16   Temp: 97.8 F (36.6 C) 97.7 F (36.5 C) 98.4 F (36.9 C) 97.6 F (36.4 C)  TempSrc: Oral Oral Oral Oral  SpO2: 95% 94% 97% 95%  Weight:      Height:        General: appears to be stated age; alert, oriented Skin: warm, dry, no rash Head:  AT/[place] Mouth:  Oral mucosa membranes appear moist, normal dentition Heart:  RRR; did not appreciate any M/R/G Lungs: CTAB, did not appreciate any wheezes, rales, or rhonchi Abdomen: + BS; soft, ND, NT Vascular: 2+ pedal pulses b/l; 2+ radial pulses b/l Extremities: no peripheral edema, no muscle wasting Neuro: strength and sensation intact in lower extremities b/l    Labs on Admission: I have personally reviewed following labs and imaging studies  CBC: Recent Labs  Lab 01/25/20 0527  WBC 7.8  HGB 13.4  HCT 40.4  MCV 94.8  PLT 309   Basic Metabolic Panel: No results for input(s): NA, K, CL, CO2, GLUCOSE, BUN, CREATININE, CALCIUM, MG, PHOS in the last 168 hours. GFR: Estimated Creatinine Clearance: 97.9 mL/min (by C-G formula based on SCr of 0.92 mg/dL). Liver Function Tests: No results for input(s): AST, ALT, ALKPHOS, BILITOT, PROT, ALBUMIN in the last 168 hours. No results for input(s): LIPASE, AMYLASE in the last 168 hours. No results for input(s): AMMONIA in the last 168 hours. Coagulation Profile: Recent Labs  Lab 01/25/20 1033  INR 1.0   Cardiac Enzymes: No results for input(s): CKTOTAL, CKMB, CKMBINDEX, TROPONINI in the last 168 hours. BNP (last 3 results) No results for input(s): PROBNP in the last 8760 hours. HbA1C: No results for input(s): HGBA1C in the last 72 hours. CBG: Recent Labs  Lab 01/25/20 0743 01/25/20 1301 01/29/20 1347 01/29/20 1754  01/29/20 2114  GLUCAP 163* 231* 155* 193* 220*   Lipid Profile: No results for input(s): CHOL, HDL, LDLCALC, TRIG, CHOLHDL, LDLDIRECT in the last 72 hours. Thyroid Function Tests: No results for input(s): TSH, T4TOTAL, FREET4, T3FREE, THYROIDAB in the last 72 hours. Anemia Panel: No results for input(s): VITAMINB12, FOLATE, FERRITIN, TIBC, IRON, RETICCTPCT in the last 72 hours. Urine analysis:    Component Value Date/Time   COLORURINE YELLOW (A) 01/14/2020 1139   APPEARANCEUR HAZY (A) 01/14/2020 1139   LABSPEC 1.017 01/14/2020 1139   PHURINE 5.0 01/14/2020 1139   GLUCOSEU NEGATIVE 01/14/2020 1139   HGBUR NEGATIVE 01/14/2020 1139   BILIRUBINUR NEGATIVE 01/14/2020 1139   KETONESUR 20 (A) 01/14/2020 1139   PROTEINUR 100 (A) 01/14/2020 1139   NITRITE NEGATIVE 01/14/2020 1139   LEUKOCYTESUR SMALL (A) 01/14/2020 1139    Radiological Exams on Admission: DG Cervical Spine 2 or 3 views  Result Date: 01/29/2020 CLINICAL DATA:  Elective surgery EXAM: CERVICAL SPINE - 2-3 VIEW COMPARISON:  None. FINDINGS: Changes of ACDF at C3-4. No hardware complicating feature. Normal alignment. IMPRESSION: No hardware complicating feature. Electronically Signed   By: Charlett Nose M.D.   On: 01/29/2020 17:22     Assessment/Plan   Key Cen. is a 74 y.o. male with medical history significant for chronic diastolic heart failure, HTN, HLD, DM2, GERD, severe cervical spinal stenosis, who was to admitted to [facility] on 01/29/2020 by the neurosurgical service for definitive surgical management of associated cervical spinal cord compression. Post-operatively, neurosurgical service consulted hospitalist service for assistance with post-op management of patient's several chronic medical problems.     Principal Problem:   S/P cervical spinal fusion Active Problems:   Hypertension   Diabetes mellitus without complication (HCC)   HLD (hyperlipidemia)   Chronic diastolic CHF  (congestive heart failure) (HCC)   #) Chronic diastolic heart failure: Most recent echocardiogram was performed on 01/15/2020 showed evidence of mild LVH, EF 60 to 65%, no evidence of focal wall motion abnormality, and showed grade 1 diastolic dysfunction.  Not on any diuretics as an outpatient.  No evidence of acutely decompensated heart failure at this time.  Plan: Monitor strict I's and O's and daily weights.      #) Type 2 diabetes mellitus: Most recent hemoglobin A1c found to be 5.8% on 01/16/2020.  On Metformin as an outpatient as well as the following basal and short-acting insulin regimen: Levemir 12 units subcu nightly as well as sliding scale NovoLog 3 times daily with meals, typically resulting in 4-8 units of NovoLog per meal.  Plan: Continue Accu-Cheks before every meal and at bedtime with moderate dose sliding scale NovoLog.  Hold basal insulin during this hospitalization.  Order placed to hold Metformin during this hospitalization.      #) Hyperlipidemia: On simvastatin at home.  Plan: Continue home statin.      #) Essential hypertension: Outpatient antihypertensive regimen consists of amlodipine as well as Coreg, while acknowledging potential anti-hypertensive implications for home tamsulosin for treatment of BPH.  Postoperative blood pressures appear to be normotensive.  Plan: Monitoring of blood pressure via routine vital signs.  Continue home amlodipine and Coreg.      #) BPH: On tamsulosin at home.  Plan: Monitor strict I's and O's and daily weights. continue home tamsulosin.      #) GERD: On as needed famotidine at home.   Plan: continue outpatient prn H2 blocker.     #) Severe cervical spinal stenosis: resulting in spinal cord compression at C3-C4 per MRI on 01/14/20. S/p anterior cervical decompression discectomy fusion at level of C3/C4 by Dr. Adriana Simas of surgery on 01/29/20. Without overt perioperative complications. Patient reports adequate pain  control at this time with post-operative prn analgesia ordered by neurosurgery service.   Plan: continue prn analgesia per neurosurgery service. Upon discharge, outpatient follow-up per neurosurgery service.       DVT prophylaxis: scd's  Code Status: Full code Family Communication: none Disposition Plan: Per Rounding Team Admission status: observation; med-surg.     PLEASE NOTE THAT DRAGON DICTATION SOFTWARE WAS USED IN THE CONSTRUCTION OF THIS NOTE.   Angie Fava DO [facility] Pager [phone] From 12PM- 12AM  Otherwise, please contact night-coverage  [email or web address] Password [email or web address]  01/30/2020, 12:52 AM

## 2020-01-30 NOTE — Evaluation (Signed)
Occupational Therapy Evaluation Patient Details Name: Roy [name]. MRN: [id number] DOB: 1946/02/16 Today's Date: 01/30/2020    History of Present Illness Roy [name] is a 74 y.o. male who presented to the [facility] emergency department on 01/14/20 with concern for worsening weakness in his lower extremities.  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 He DC'd briefly to SNF to await sx, and is now s/p C 3-4 anterior cervical decompression/discectomy. PMH significant for chronic diastolic heart failure, HTN, HLD, DM2, GERD, and left eye blindness.   Clinical Impression   Pt seen for OT evaluation this date, POD#1 from above surgery. Prior to hospital admission, pt was close to dependent level for ADL management. His spouse at bedside reports he required assistance for feeding, bathing, dressing, and bed-level toileting since his initial admission earlier this month. Prior to this pt was active and independent. Pt lives with spouse in a single family home with 4 steps to enter at the garage with spouse able to provide 24/7 assist/support as needed for pt. Currently pt requires MAX A for bed level LB dressing, MAX A for toileting at bed level and +2 MAX A for functional mobility. Pt/caregiver educated in cervical precautions, self care skills, AE, and home/routines modifications to maximize safety and functional independence while minimizing falls risk and maintaining precautions. Pt verbalized understanding of education. Pt would benefit from skilled OT to address noted impairments and functional limitations (see below for any additional details) in order to maximize safety and independence while minimizing falls risk and caregiver burden.  Upon hospital discharge, recommend pt discharge to CIR to maximize safety and return to PLOF.    Follow Up Recommendations  CIR    Equipment Recommendations  3 in 1 bedside commode;Other (comment) [name])     Recommendations for Other Services Rehab consult     Precautions / Restrictions Precautions Precautions: Fall;Cervical Precaution Booklet Issued: Yes (comment) Required Braces or Orthoses: Other Brace Other Brace: pt has personal B knee braces he brought from home Restrictions Weight Bearing Restrictions: No Other Position/Activity Restrictions: B shoulder pain/weakness limting UE assist.      Mobility Bed Mobility Overal bed mobility: Needs Assistance Bed Mobility: Rolling;Sit to Sidelying Rolling: Min assist       Sit to sidelying: Max assist;+2 for physical assistance General bed mobility comments: MAX A +2 with cueing for use of log-roll technique. Assist primarily required to bring BLE over EOB.    Transfers     Transfers: Sit to/from Stand Sit to Stand: +2 physical assistance;Max assist              Balance Overall balance assessment: Needs assistance Sitting-balance support: Feet supported;Single extremity supported Sitting balance-Leahy Scale: Good     Standing balance support: Bilateral upper extremity supported;During functional activity Standing balance-Leahy Scale: Poor Standing balance comment: Requires BUE support on RW and +2 assist to maintain standing balance.                           ADL either performed or assessed with clinical judgement   ADL Overall ADL's : Needs assistance/impaired Eating/Feeding: Maximal assistance;Sitting;Bed level;With caregiver independent assisting Eating/Feeding Details (indicate cue type and reason): Pt limited by cervical precautions, unable to utlize learned compensatory stratgies for decreased shoulder flexion and FMC. Spouse has been assisting with all meals since admission. Grooming: Sitting;Cueing for compensatory techniques;Cueing for safety;Moderate assistance   Upper Body Bathing: Moderate  assistance;Sitting;Cueing for safety;Cueing for compensatory techniques   Lower Body Bathing: Cueing  for back precautions;Moderate assistance;Sitting/lateral leans   Upper Body Dressing : Sitting;Maximal assistance;Cueing for safety;Cueing for compensatory techniques   Lower Body Dressing: Cueing for compensatory techniques;+2 for physical assistance;Maximal assistance;Sit to/from stand   Toilet Transfer: +2 for physical assistance;Maximal assistance;Cueing for safety;Cueing for sequencing;Stand-pivot;BSC   Toileting- Clothing Manipulation and Hygiene: Bed level;Maximal assistance Toileting - Clothing Manipulation Details (indicate cue type and reason): Pt is able to roll side-to side with Min A cueing for cervical precautions.             Vision Baseline Vision/History: Glaucoma;Wears glasses (H/o L eye blindness) Wears Glasses:  (Intermittently) Patient Visual Report: No change from baseline       Perception     Praxis      Pertinent Vitals/Pain Pain Assessment: No/denies pain     Hand Dominance Left   Extremity/Trunk Assessment Upper Extremity Assessment Upper Extremity Assessment: LUE deficits/detail;Generalized weakness (RUE generally weak, pt endorses hx of RUE injury which limits FMC particularly at 4th-5th digit.) LUE Deficits / Details: Pt endorses LUE sensation is improved since his sx, however, it remains weak with poor shoulder flexion/abduction. AROM limited to ~80-90 degrees strength poor grossly 2/5 as well as decreased FMC and edema in L hand. LUE Sensation: history of peripheral neuropathy LUE Coordination: decreased fine motor;decreased gross motor   Lower Extremity Assessment Lower Extremity Assessment: Generalized weakness;Defer to PT evaluation   Cervical / Trunk Assessment Cervical / Trunk Assessment: Kyphotic (Foreward head and shoulders)   Communication Communication Communication: No difficulties   Cognition Arousal/Alertness: Awake/alert Behavior During Therapy: WFL for tasks assessed/performed Overall Cognitive Status: Within Functional  Limits for tasks assessed                                 General Comments: Pt is A and O x 4 and agreeable and cooperative throughout   General Comments       Exercises Other Exercises Other Exercises: Pt/caregiver (spouse) educated on falls prevention strategies, safe use of AE/DME for ADL management including use of built-up handle to support self-feeding, cervical precautions (handout provided), and considerations for ADL management with cervical precautions.   Shoulder Instructions      Home Living Family/patient expects to be discharged to:: Skilled nursing facility   Available Help at Discharge: Family Type of Home: House Home Access: Stairs to enter [employer] of Steps: 7 in front, 4 in the garage   Home Layout: One level               Home Equipment: Walker - 2 wheels;Walker - 4 wheels;Grab bars - tub/shower;Shower seat          Prior Functioning/Environment Level of Independence: Needs assistance  Gait / Transfers Assistance Needed: able to ambulate short distances with RW at home at baseline. ADL's / Homemaking Assistance Needed: Pt/caregiver report, pt requires assist with most BADL management at baseline. He has difficulty with self-feeding, bathing, and dressing. He has been generally bed level for ADL management since fall this month.            OT Problem List: Decreased strength;Decreased range of motion;Pain;Impaired UE functional use;Decreased knowledge of use of DME or AE;Impaired vision/perception;Decreased activity tolerance;Impaired balance (sitting and/or standing);Decreased safety awareness      OT Treatment/Interventions: Self-care/ADL training;Therapeutic exercise;Patient/family education;Therapeutic activities;Neuromuscular education;DME and/or AE instruction    OT Goals(Current goals can  be found in the care plan section) Acute Rehab OT Goals Patient Stated Goal: to get my strength back OT Goal Formulation: With  patient Time For Goal Achievement: 02/13/20 Potential to Achieve Goals: Good ADL Goals Pt Will Perform Lower Body Dressing: sit to/from stand;with adaptive equipment;with mod assist (c LRAD PRN for improved safety and functional independence.) Pt Will Transfer to Toilet: stand pivot transfer;bedside commode;with mod assist (c LRAD PRN for improved safety and functional independence.) Pt Will Perform Toileting - Clothing Manipulation and hygiene: with min assist;sit to/from stand;with adaptive equipment (c LRAD PRN for improved safety and functional independence.)  OT Frequency: Min 3X/week   Barriers to D/C: Decreased caregiver support          Co-evaluation              AM-PAC OT "6 Clicks" Daily Activity     Outcome Measure Help from another person eating meals?: A Lot Help from another person taking care of personal grooming?: A Lot Help from another person toileting, which includes using toliet, bedpan, or urinal?: A Lot Help from another person bathing (including washing, rinsing, drying)?: A Lot Help from another person to put on and taking off regular upper body clothing?: A Lot Help from another person to put on and taking off regular lower body clothing?: A Lot 6 Click Score: 12   End of Session Equipment Utilized During Treatment: Gait belt Nurse Communication: Mobility status  Activity Tolerance: Patient tolerated treatment well Patient left: in bed;with call bell/phone within reach;with bed alarm set;with family/visitor present  OT Visit Diagnosis: Unsteadiness on feet (R26.81);Muscle weakness (generalized) (M62.81)                Time: 1761-6073 OT Time Calculation (min): 33 min Charges:  OT General Charges $OT Visit: 1 Visit OT Evaluation $OT Eval Moderate Complexity: 1 Mod OT Treatments $Self Care/Home Management : 23-37 mins  Rockney Ghee, M.S., OTR/L Ascom: [phone] 01/30/20, 12:46 PM

## 2020-01-31 DIAGNOSIS — N401 Enlarged prostate with lower urinary tract symptoms: Secondary | ICD-10-CM | POA: Diagnosis present

## 2020-01-31 DIAGNOSIS — Z9889 Other specified postprocedural states: Secondary | ICD-10-CM

## 2020-01-31 DIAGNOSIS — M4802 Spinal stenosis, cervical region: Secondary | ICD-10-CM | POA: Diagnosis present

## 2020-01-31 DIAGNOSIS — E785 Hyperlipidemia, unspecified: Secondary | ICD-10-CM | POA: Diagnosis present

## 2020-01-31 DIAGNOSIS — Z7982 Long term (current) use of aspirin: Secondary | ICD-10-CM | POA: Diagnosis not present

## 2020-01-31 DIAGNOSIS — G5602 Carpal tunnel syndrome, left upper limb: Secondary | ICD-10-CM | POA: Diagnosis present

## 2020-01-31 DIAGNOSIS — I110 Hypertensive heart disease with heart failure: Secondary | ICD-10-CM | POA: Diagnosis present

## 2020-01-31 DIAGNOSIS — M2578 Osteophyte, vertebrae: Secondary | ICD-10-CM | POA: Diagnosis present

## 2020-01-31 DIAGNOSIS — E119 Type 2 diabetes mellitus without complications: Secondary | ICD-10-CM | POA: Diagnosis present

## 2020-01-31 DIAGNOSIS — I5032 Chronic diastolic (congestive) heart failure: Secondary | ICD-10-CM | POA: Diagnosis present

## 2020-01-31 DIAGNOSIS — I2510 Atherosclerotic heart disease of native coronary artery without angina pectoris: Secondary | ICD-10-CM | POA: Diagnosis present

## 2020-01-31 DIAGNOSIS — Z981 Arthrodesis status: Secondary | ICD-10-CM | POA: Diagnosis not present

## 2020-01-31 DIAGNOSIS — G9520 Unspecified cord compression: Secondary | ICD-10-CM | POA: Diagnosis present

## 2020-01-31 DIAGNOSIS — M48061 Spinal stenosis, lumbar region without neurogenic claudication: Secondary | ICD-10-CM | POA: Diagnosis present

## 2020-01-31 DIAGNOSIS — Z20822 Contact with and (suspected) exposure to covid-19: Secondary | ICD-10-CM | POA: Diagnosis present

## 2020-01-31 DIAGNOSIS — Z79899 Other long term (current) drug therapy: Secondary | ICD-10-CM | POA: Diagnosis not present

## 2020-01-31 DIAGNOSIS — Z96651 Presence of right artificial knee joint: Secondary | ICD-10-CM | POA: Diagnosis present

## 2020-01-31 DIAGNOSIS — R338 Other retention of urine: Secondary | ICD-10-CM | POA: Diagnosis present

## 2020-01-31 DIAGNOSIS — Z794 Long term (current) use of insulin: Secondary | ICD-10-CM | POA: Diagnosis not present

## 2020-01-31 DIAGNOSIS — K219 Gastro-esophageal reflux disease without esophagitis: Secondary | ICD-10-CM | POA: Diagnosis present

## 2020-01-31 LAB — GLUCOSE, CAPILLARY
Glucose-Capillary: 176 mg/dL — ABNORMAL HIGH (ref 70–99)
Glucose-Capillary: 184 mg/dL — ABNORMAL HIGH (ref 70–99)
Glucose-Capillary: 230 mg/dL — ABNORMAL HIGH (ref 70–99)
Glucose-Capillary: 215 mg/dL — ABNORMAL HIGH (ref 70–99)

## 2020-01-31 MED ORDER — INSULIN DETEMIR 100 UNIT/ML ~~LOC~~ SOLN
8.0000 [IU] | Freq: Every day | SUBCUTANEOUS | Status: DC
  Administered 2020-01-31: 8 [IU] via SUBCUTANEOUS
  Filled 2020-01-31: qty 0.08

## 2020-01-31 MED ORDER — HYDRALAZINE HCL 20 MG/ML IJ SOLN: 10.0000 mg | INTRAMUSCULAR | Status: DC | PRN

## 2020-01-31 MED ORDER — RAMELTEON 8 MG PO TABS
8.0000 mg | ORAL_TABLET | Freq: Every day | ORAL | Status: DC
  Administered 2020-01-31 – 2020-02-01 (×2): 8 mg via ORAL
  Filled 2020-01-31 (×4): qty 1

## 2020-01-31 NOTE — Progress Notes (Signed)
Physical Therapy Treatment Patient Details Name: Roy [name]. MRN: [id number] DOB: 06/23/1945 Today's Date: 01/31/2020    History of Present Illness Roy [name] is a 74 y.o. male who presented to the [facility] emergency department on 01/14/20 with concern for worsening weakness in his lower extremities.  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 He DC'd briefly to SNF to await sx, and is now s/p C 3-4 anterior cervical decompression/discectomy. PMH significant for chronic diastolic heart failure, HTN, HLD, DM2, GERD, and left eye blindness.    PT Comments    Pt lying in bed upon arrival to room with wife at bedside. Pt excited and ready to participate in PT session. Pt initially reporting that he may have to have a BM so pt assisted onto and off of bedpan with min A for rolling.  Pt also reporting a cramping sensation on L posterolateral lower back and requested clinician to feel it. Nothing noted upon palpation of the area. Pt then performed BLE therex in bed for promotion of muscle strengthening with verbal cues for correct technique. Pt then assisted to sitting edge of bed with max+2 A for truncal management. Pt then stood twice requiring max+2 A for boost into standing and balance with BUE on RW. Verbal cues for hand placement during transfer and once hips cleared bed, for hip and truncal extension for improved upright stance. Pt required L knee blocking and able to stand ~1 min 20 sec on first attempt. Second attempt, pt stood for 2 mins. Pt attempted weight shifting then unweighting of BLE with pt able to clear both L and R LEs with max+2 for balance. Max+2 A for controlled lowering to bed and for return to supine position. Pt progressing today standing for increased amount of time each bout and able to unweight BLE with L knee blocking for entirety of stance phase. Pt continues to require verbal and tactile cues for maintaining cervical  precautions throughout entire session as pt noted to turn head to converse with wife, clinician, etc. Pt remained engaged throughout session and reported that standing felt really good today. Continue to recommend CIR at discharge from acute hospital stay.    Follow Up Recommendations  CIR     Equipment Recommendations  None recommended by PT    Recommendations for Other Services       Precautions / Restrictions Precautions Precautions: Fall;Cervical Required Braces or Orthoses: Other Brace Other Brace: bilat knee braces Restrictions Weight Bearing Restrictions: No    Mobility  Bed Mobility Overal bed mobility: Needs Assistance Bed Mobility: Rolling;Sidelying to Sit;Sit to Sidelying Rolling: Min assist Sidelying to sit: Max assist;+2 for physical assistance     Sit to sidelying: Max assist;+2 for physical assistance General bed mobility comments: Pt self initiating rolling to L and required min A to come into full sidelying position; max+2 for sidelying <> sit transfer for trunk management with min A for BLE management   Transfers Overall transfer level: Needs assistance Equipment used: Rolling walker (2 wheeled) Transfers: Sit to/from Stand Sit to Stand: Max assist;+2 physical assistance;From elevated surface         General transfer comment: elevated height of bed and pt requested to elevate it more; L knee blocked due to history of buckling; max+2 A for sit <> stand transfers for boost into standing and eccentric control on descent  Ambulation/Gait             General Gait Details:  not attempted   Stairs             Wheelchair Mobility    Modified Rankin (Stroke Patients Only)       Balance Overall balance assessment: Needs assistance Sitting-balance support: Bilateral upper extremity supported;No upper extremity supported;Feet supported Sitting balance-Leahy Scale: Fair Sitting balance - Comments: SBA for safety   Standing balance support:  Bilateral upper extremity supported;During functional activity Standing balance-Leahy Scale: Poor Standing balance comment: BUE on RW, L knee block, and +2 A for balance                            Cognition Arousal/Alertness: Awake/alert Behavior During Therapy: WFL for tasks assessed/performed Overall Cognitive Status: Within Functional Limits for tasks assessed                                        Exercises Other Exercises Other Exercises: pt performed AP, QS, isometric hip ab and add x 12 each; resistive leg presses x 20 Other Exercises: pt performed weight shifting in standing with max+2 A for remaining upright and balance; BUE on RW Other Exercises: additional time taken to place pt on and remove off of bedpan with pericare; noted mostly gas however small drops of loose stool present    General Comments        Pertinent Vitals/Pain Pain Assessment: Faces Faces Pain Scale: Hurts even more Pain Location: posterolateral lower back Pain Descriptors / Indicators: Cramping Pain Intervention(s): Monitored during session;Repositioned    Home Living                      Prior Function            PT Goals (current goals can now be found in the care plan section) Acute Rehab PT Goals Patient Stated Goal: to get my strength back PT Goal Formulation: With patient/family Time For Goal Achievement: 02/13/20 Potential to Achieve Goals: Fair Progress towards PT goals: Progressing toward goals    Frequency    7X/week      PT Plan Current plan remains appropriate    Co-evaluation              AM-PAC PT "6 Clicks" Mobility   Outcome Measure  Help needed turning from your back to your side while in a flat bed without using bedrails?: A Little Help needed moving from lying on your back to sitting on the side of a flat bed without using bedrails?: A Lot Help needed moving to and from a bed to a chair (including a wheelchair)?: A  Lot Help needed standing up from a chair using your arms (e.g., wheelchair or bedside chair)?: A Lot Help needed to walk in hospital room?: Total Help needed climbing 3-5 steps with a railing? : Total 6 Click Score: 11    End of Session Equipment Utilized During Treatment: Gait belt Activity Tolerance: Patient tolerated treatment well Patient left: in bed;with call bell/phone within reach;with bed alarm set;with family/visitor present Nurse Communication: Mobility status PT Visit Diagnosis: Unsteadiness on feet (R26.81);Other abnormalities of gait and mobility (R26.89);Repeated falls (R29.6);Muscle weakness (generalized) (M62.81);History of falling (Z91.81);Difficulty in walking, not elsewhere classified (R26.2);Other symptoms and signs involving the nervous system (I29.798)     Time: 9211-9417 PT Time Calculation (min) (ACUTE ONLY): 43 min  Charges:  Frederich Chick, SPT   Myka Lukins 01/31/2020, 12:00 PM

## 2020-01-31 NOTE — Progress Notes (Signed)
Occupational Therapy Treatment Patient Details Name: Roy [name]. MRN: [id number] DOB: Sep 10, 1945 Today's Date: 01/31/2020    History of present illness Roy [name]" Delair is a 74 y.o. male who presented to the [facility] emergency department on 01/14/20 with concern for worsening weakness in his lower extremities.  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 He DC'd briefly to SNF to await sx, and is now s/p C 3-4 anterior cervical decompression/discectomy. PMH significant for chronic diastolic heart failure, HTN, HLD, DM2, GERD, and left eye blindness.   OT comments  Pt seen for OT tx this date; wife present. Pt endorses no pain, and both motivated to participate. Pt recently off bed pan with nursing. Pt/spouse instructed in AE for ADL mgt including hinged extended utensils and use of tray table to support LUE 2/2 shoulder strength deficits, adaptive strategies for LB dressing and toileting. Both verbalized understanding. Bed mobility deferred, pt BP remains elevated (147/93); RN notified. Pt continues to benefit from skilled OT services. Pt/spouse continue to be hopeful for inpatient rehab bed.    Follow Up Recommendations  CIR    Equipment Recommendations  3 in 1 bedside commode;Other (comment) (bari BSC)    Recommendations for Other Services Rehab consult    Precautions / Restrictions Precautions Precautions: Fall;Cervical Required Braces or Orthoses: Other Brace Other Brace: bilat knee braces Restrictions Weight Bearing Restrictions: No Other Position/Activity Restrictions: B shoulder pain/weakness limting UE assist.       Mobility Bed Mobility                  Transfers                      Balance                                           ADL either performed or assessed with clinical judgement   ADL Overall ADL's : Needs assistance/impaired Eating/Feeding: Maximal  assistance;Sitting;Bed level;With caregiver independent assisting                                           Vision       Perception     Praxis      Cognition Arousal/Alertness: Awake/alert Behavior During Therapy: WFL for tasks assessed/performed Overall Cognitive Status: Within Functional Limits for tasks assessed                                          Exercises Other Exercises Other Exercises: Pt/spouse instructed in AE for ADL mgt including hinged extended utensils and use of tray table to support LUE 2/2 shoulder strength deficits, adaptive strategies for LB dressing and toileting   Shoulder Instructions       General Comments      Pertinent Vitals/ Pain       Pain Assessment: No/denies pain  Home Living                                          Prior Functioning/Environment  Frequency  Min 3X/week        Progress Toward Goals  OT Goals(current goals can now be found in the care plan section)  Progress towards OT goals: Progressing toward goals  Acute Rehab OT Goals Patient Stated Goal: to get my strength back OT Goal Formulation: With patient/family Time For Goal Achievement: 02/13/20 Potential to Achieve Goals: Good  Plan Discharge plan remains appropriate;Frequency remains appropriate    Co-evaluation                 AM-PAC OT "6 Clicks" Daily Activity     Outcome Measure   Help from another person eating meals?: A Lot Help from another person taking care of personal grooming?: A Lot Help from another person toileting, which includes using toliet, bedpan, or urinal?: A Lot Help from another person bathing (including washing, rinsing, drying)?: A Lot Help from another person to put on and taking off regular upper body clothing?: A Lot Help from another person to put on and taking off regular lower body clothing?: A Lot 6 Click Score: 12    End of Session    OT  Visit Diagnosis: Unsteadiness on feet (R26.81);Muscle weakness (generalized) (M62.81)   Activity Tolerance Patient tolerated treatment well;Other (comment) (limited by elevated BP)   Patient Left in bed;with call bell/phone within reach;with bed alarm set;with family/visitor present   Nurse Communication Other (comment) (BP)        Time: 7353-2992 OT Time Calculation (min): 24 min  Charges: OT General Charges $OT Visit: 1 Visit OT Treatments $Self Care/Home Management : 23-37 mins  Richrd Prime, MPH, MS, OTR/L ascom [phone] 01/31/20, 4:12 PM

## 2020-01-31 NOTE — Plan of Care (Signed)

## 2020-01-31 NOTE — Progress Notes (Signed)
PROGRESS NOTE    Roy [name].  XHB:[id number] DOB: February 18, 1946 DOA: 01/29/2020 PCP: Rigoberto Noel, MD  Brief Narrative:  Consulted by: Dr. Adriana Simas of neurosurgery and Autumn Messing (neurosurgery NP);  Reason for Consult: assistance with post-op management of several chronic medical problems, including type 2 diabetes mellitus, hypertension, and chronic diastolic heart failure.   Patient seen and examined.  Wife at bedside.  Blood pressure well controlled.  Blood sugars slowly start to rise  [facility] hospitalist service to continue following this patient with you   Assessment & Plan:   Principal Problem:   S/P cervical spinal fusion Active Problems:   Hypertension   Diabetes mellitus without complication (HCC)   HLD (hyperlipidemia)   Chronic diastolic CHF (congestive heart failure) (HCC)  Chronic diastolic heart failure Most recent echocardiogram was performed on 01/15/2020 showed evidence of mild LVH, EF 60 to 65%, no evidence of focal wall motion abnormality, and showed grade 1 diastolic dysfunction.   Not on any diuretics as an outpatient.   No evidence of acutely decompensated heart failure at this time.  Plan: Monitor strict I's and O's and daily weights.   Type 2 diabetes mellitus  Most recent hemoglobin A1c 5.8% on 01/16/2020.   On Metformin as an outpatient as well as the following basal and short-acting insulin regimen: Levemir 12 units subcu nightly as well as sliding scale NovoLog 3 times daily with meals, typically resulting in 4-8 units of NovoLog per meal.  -Wife is a retired Scientist, clinical (histocompatibility and immunogenetics) so keeps close monitoring of his blood pressure and blood sugar as outpatient  Plan: Continue sliding scale, moderate intensity Increase Levemir to 8 units nightly Hold Metformin Carb modified diet  Hyperlipidemia  On simvastatin at home.  Plan: Continue home statin.  Essential hypertension  Amlodipine and Coreg outpatient regimen Per outpatient cardiology some  consideration for lisinopril addition Postoperative blood pressures appear to be normotensive.  Plan: Continue amlodipine 5 mg daily Continue carvedilol 6.25 mg twice daily As needed hydralazine Monitor blood pressure per unit protocol Consider addition of lisinopril if needed   DVT prophylaxis: SCDs Code Status: Full Family Communication: Wife at bedside 10/21 Disposition Plan: Per primary neurosurgical team.  Hospitalist on consult   Consultants:   [facility] hospitalist  Procedures:   Anterior cervical decompression discectomy, 01/29/2020  Antimicrobials:   None   Subjective: Seen and examined.  Wife at bedside.  No pain complaints.  Objective: Vitals:   01/30/20 1106 01/30/20 1616 01/30/20 2252 01/31/20 0751  BP: 115/65 134/86 (!) 142/76 (!) 145/91  Pulse: (!) 59 73 61 63  Resp: 18 16 18 16   Temp: 98 F (36.7 C) 98 F (36.7 C) 98 F (36.7 C) 97.6 F (36.4 C)  TempSrc: Oral Oral Oral Oral  SpO2: 99% 98% 97% 98%  Weight:      Height:        Intake/Output Summary (Last 24 hours) at 01/31/2020 1242 Last data filed at 01/31/2020 1039 Gross per 24 hour  Intake 243 ml  Output --  Net 243 ml   Filed Weights   01/29/20 1325  Weight: 115.5 kg    Examination:  General exam: Appears calm and comfortable  Respiratory system: Clear to auscultation. Respiratory effort normal. Cardiovascular system: S1 & S2 heard, RRR. No JVD, murmurs, rubs, gallops or clicks. No pedal edema. Gastrointestinal system: Abdomen is nondistended, soft and nontender. No organomegaly or masses felt. Normal bowel sounds heard. Central nervous system: Alert and oriented. No focal neurological deficits. Extremities: Symmetric  5 x 5 power. Skin: No rashes, lesions or ulcers Psychiatry: Judgement and insight appear normal. Mood & affect appropriate.     Data Reviewed: I have personally reviewed following labs and imaging studies  CBC: Recent Labs  Lab 01/25/20 0527  WBC 7.8  HGB  13.4  HCT 40.4  MCV 94.8  PLT 309   Basic Metabolic Panel: No results for input(s): NA, K, CL, CO2, GLUCOSE, BUN, CREATININE, CALCIUM, MG, PHOS in the last 168 hours. GFR: Estimated Creatinine Clearance: 97.9 mL/min (by C-G formula based on SCr of 0.92 mg/dL). Liver Function Tests: No results for input(s): AST, ALT, ALKPHOS, BILITOT, PROT, ALBUMIN in the last 168 hours. No results for input(s): LIPASE, AMYLASE in the last 168 hours. No results for input(s): AMMONIA in the last 168 hours. Coagulation Profile: Recent Labs  Lab 01/25/20 1033  INR 1.0   Cardiac Enzymes: No results for input(s): CKTOTAL, CKMB, CKMBINDEX, TROPONINI in the last 168 hours. BNP (last 3 results) No results for input(s): PROBNP in the last 8760 hours. HbA1C: No results for input(s): HGBA1C in the last 72 hours. CBG: Recent Labs  Lab 01/30/20 1126 01/30/20 1637 01/30/20 2052 01/31/20 0753 01/31/20 1154  GLUCAP 190* 224* 254* 176* 184*   Lipid Profile: No results for input(s): CHOL, HDL, LDLCALC, TRIG, CHOLHDL, LDLDIRECT in the last 72 hours. Thyroid Function Tests: No results for input(s): TSH, T4TOTAL, FREET4, T3FREE, THYROIDAB in the last 72 hours. Anemia Panel: No results for input(s): VITAMINB12, FOLATE, FERRITIN, TIBC, IRON, RETICCTPCT in the last 72 hours. Sepsis Labs: No results for input(s): PROCALCITON, LATICACIDVEN in the last 168 hours.  Recent Results (from the past 240 hour(s))  Respiratory Panel by RT PCR (Flu A&B, Covid) - Nasopharyngeal Swab     Status: None   Collection Time: 01/25/20 10:33 AM   Specimen: Nasopharyngeal Swab  Result Value Ref Range Status   SARS Coronavirus 2 by RT PCR NEGATIVE NEGATIVE Final    Comment: (NOTE) SARS-CoV-2 target nucleic acids are NOT DETECTED.  The SARS-CoV-2 RNA is generally detectable in upper respiratoy specimens during the acute phase of infection. The lowest concentration of SARS-CoV-2 viral copies this assay can detect is 131  copies/mL. A negative result does not preclude SARS-Cov-2 infection and should not be used as the sole basis for treatment or other patient management decisions. A negative result may occur with  improper specimen collection/handling, submission of specimen other than nasopharyngeal swab, presence of viral mutation(s) within the areas targeted by this assay, and inadequate number of viral copies (<131 copies/mL). A negative result must be combined with clinical observations, patient history, and epidemiological information. The expected result is Negative.  Fact Sheet for Patients:  https://www.moore.com/  Fact Sheet for Healthcare Providers:  https://www.young.biz/  This test is no t yet approved or cleared by the [place] FDA and  has been authorized for detection and/or diagnosis of SARS-CoV-2 by FDA under an Emergency Use Authorization (EUA). This EUA will remain  in effect (meaning this test can be used) for the duration of the COVID-19 declaration under Section 564(b)(1) of the Act, 21 U.S.C. section 360bbb-3(b)(1), unless the authorization is terminated or revoked sooner.     Influenza A by PCR NEGATIVE NEGATIVE Final   Influenza B by PCR NEGATIVE NEGATIVE Final    Comment: (NOTE) The Xpert Xpress SARS-CoV-2/FLU/RSV assay is intended as an aid in  the diagnosis of influenza from Nasopharyngeal swab specimens and  should not be used as a sole basis for treatment.  Nasal washings and  aspirates are unacceptable for Xpert Xpress SARS-CoV-2/FLU/RSV  testing.  Fact Sheet for Patients: https://www.moore.com/  Fact Sheet for Healthcare Providers: https://www.young.biz/  This test is not yet approved or cleared by the [place] FDA and  has been authorized for detection and/or diagnosis of SARS-CoV-2 by  FDA under an Emergency Use Authorization (EUA). This EUA will remain  in effect (meaning  this test can be used) for the duration of the  Covid-19 declaration under Section 564(b)(1) of the Act, 21  U.S.C. section 360bbb-3(b)(1), unless the authorization is  terminated or revoked. Performed at [facility], [address] [place], [place] [address]   SARS CORONAVIRUS 2 (TAT 6-24 HRS) Nasopharyngeal Nasopharyngeal Swab     Status: None   Collection Time: 01/28/20  2:52 PM   Specimen: Nasopharyngeal Swab  Result Value Ref Range Status   SARS Coronavirus 2 NEGATIVE NEGATIVE Final    Comment: (NOTE) SARS-CoV-2 target nucleic acids are NOT DETECTED.  The SARS-CoV-2 RNA is generally detectable in upper and lower respiratory specimens during the acute phase of infection. Negative results do not preclude SARS-CoV-2 infection, do not rule out co-infections with other pathogens, and should not be used as the sole basis for treatment or other patient management decisions. Negative results must be combined with clinical observations, patient history, and epidemiological information. The expected result is Negative.  Fact Sheet for Patients: HairSlick.no  Fact Sheet for Healthcare Providers: quierodirigir.com  This test is not yet approved or cleared by the [place] FDA and  has been authorized for detection and/or diagnosis of SARS-CoV-2 by FDA under an Emergency Use Authorization (EUA). This EUA will remain  in effect (meaning this test can be used) for the duration of the COVID-19 declaration under Se ction 564(b)(1) of the Act, 21 U.S.C. section 360bbb-3(b)(1), unless the authorization is terminated or revoked sooner.  Performed at [facility], [address], [place], [place] [address]          Radiology Studies: DG Cervical Spine 2 or 3 views  Result Date: 01/30/2020 CLINICAL DATA:  Status post fusion EXAM: CERVICAL SPINE - 2-3 VIEW COMPARISON:  Intraoperative cervical images January 29, 2020 FINDINGS: Frontal and lateral views were obtained. Patient is status post anterior screw and plate fixation at C3 and C4 with disc spacer at C3-4. Support hardware intact. No fracture or spondylolisthesis. The prevertebral soft tissues and predental space regions are normal. There is moderately severe disc space narrowing at C5-6 and C6-7. Other disc spaces appear unremarkable. Lung apices are clear. There is calcification in each carotid artery. IMPRESSION: Postoperative anterior screw and plate fixation at C3 and C4 with disc spacer at C3-4. Support hardware intact. Arthropathy noted at C5-6 and C6-7. No fracture or spondylolisthesis. There is calcification in each carotid artery. Electronically Signed   By: Bretta Bang III M.D.   On: 01/30/2020 09:12   DG Cervical Spine 2 or 3 views  Result Date: 01/29/2020 CLINICAL DATA:  Elective surgery EXAM: CERVICAL SPINE - 2-3 VIEW COMPARISON:  None. FINDINGS: Changes of ACDF at C3-4. No hardware complicating feature. Normal alignment. IMPRESSION: No hardware complicating feature. Electronically Signed   By: Charlett Nose M.D.   On: 01/29/2020 17:22        Scheduled Meds: . acetaminophen  1,000 mg Oral Q8H  . amLODipine  5 mg Oral Daily  . brimonidine  1 drop Right Eye BID  . carvedilol  6.25 mg Oral BID  .  Chlorhexidine Gluconate Cloth  6 each Topical Daily  . docusate sodium  100 mg Oral BID  . enoxaparin (LOVENOX) injection  40 mg Subcutaneous Q24H  . insulin aspart  0-15 Units Subcutaneous TID WC  . insulin detemir  8 Units Subcutaneous QHS  . latanoprost  1 drop Right Eye QHS  . polyethylene glycol powder  17 g Oral q AM  . senna  2 tablet Oral BID  . simvastatin  20 mg Oral Daily  . sodium chloride flush  3 mL Intravenous Q12H  . tamsulosin  0.4 mg Oral QPC supper   Continuous Infusions: . sodium chloride Stopped (01/30/20 1505)  . methocarbamol (ROBAXIN) IV       LOS: 1 day    Time spent: 15 minutes    Tresa Moore, MD [facility] Pager [phone]  If 7PM-7AM, please contact night-coverage 01/31/2020, 12:42 PM

## 2020-01-31 NOTE — Progress Notes (Signed)
Procedure: C3-4 ACDF Procedure date: 01/29/2020 Diagnosis: cervi al radiculopathy    History: Roy [place]. is s/p C3-4 ACDF POD1: Roy [name] is doing well s/p ACDF. He has worked with PT. He is awaiting case management for assistance with  Placement.  POD0: Tolerated procedure well. Evaluated in post op recovery still disoriented from anesthesia but able to answer questions and obey commands.   Physical Exam: Vitals:   01/30/20 2252 01/31/20 0751  BP: (!) 142/76 (!) 145/91  Pulse: 61 63  Resp: 18 16  Temp: 98 F (36.7 C) 97.6 F (36.4 C)  SpO2: 97% 98%    General: alert and orietned, lying in bed Strength:5/5 throughout  Sensation: intact and symmetric throughout  Skin: Incision C/D/I, no evidence of hematoma noted  Data:  No results for input(s): NA, K, CL, CO2, BUN, CREATININE, LABGLOM, GLUCOSE, CALCIUM in the last 168 hours. No results for input(s): AST, ALT, ALKPHOS in the last 168 hours.  Invalid input(s): TBILI   Recent Labs  Lab 01/25/20 0527  WBC 7.8  HGB 13.4  HCT 40.4  PLT 309   Recent Labs  Lab 01/25/20 1033  APTT 29  INR 1.0         Assessment/Plan:  Roy [place]. is POD1 s/p ACDF C3-4.. Will continue to monitor   #ACDF C3-4 - mobilize - pain control - PTOT - Imaging reviewed, good alignment, no complications  #DVT Prophylaxis - DVT prophylaxis Lovenox 40mg  subq qd - SCDs - Ambulation  ##DM2, CHF, CAD - Continue home medications, appreciate medical team assistance with management of comorbid conditions  Disposition per case management. Appreciate assistance.    , NP Department of Neurosurgery

## 2020-01-31 NOTE — TOC Initial Note (Signed)
Transition of Care (TOC) - Initial/Assessment Note    Patient Details  Name: Roy [name]. MRN: [id number] Date of Birth: Apr 26, 1945  Transition of Care [facility]) CM/SW Contact:    Shelbie Ammons, RN Phone Number: 01/31/2020, 9:44 AM  Clinical Narrative:  RNCM met with patient and wife in room. Patient alert and verbally responsive but most of conversation was with wife. Wife reports that it is there goal for patient to get to inpatient rehab so that he can get strong enough to return home. She reports that they would like for this to be at [facility]. She reports that she worked there during her career, she has friends there and they live much closer to [place] reached out to Welty with [employer] and she reported that they didn't have any beds today but that they should have some opening up. She reported that she would review referral, clinicals faxed to [phone].                Expected Discharge Plan: IP Rehab Facility Barriers to Discharge: Continued Medical Work up   Patient Goals and CMS Choice Patient states their goals for this hospitalization and ongoing recovery are:: Wife reports that they want to get patient to inpatient rehab so he can re-gain some strength and be able to come home.      Expected Discharge Plan and Services Expected Discharge Plan: [place]     Post Acute Care Choice: IP Rehab Living arrangements for the past 2 months: Single Family Home                                      Prior Living Arrangements/Services Living arrangements for the past 2 months: Single Family Home Lives with:: Spouse Patient language and need for interpreter reviewed:: Yes Do you feel safe going back to the place where you live?: Yes      Need for Family Participation in Patient Care: Yes (Comment) Care giver support system in place?: Yes (comment)   Criminal Activity/Legal Involvement Pertinent to Current  Situation/Hospitalization: No - Comment as needed  Activities of Daily Living Home Assistive Devices/Equipment: Eyeglasses ADL Screening (condition at time of admission) Patient's cognitive ability adequate to safely complete daily activities?: No Is the patient deaf or have difficulty hearing?: Yes Does the patient have difficulty seeing, even when wearing glasses/contacts?: Yes (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) Does the patient have difficulty concentrating, remembering, or making decisions?: No Patient able to express need for assistance with ADLs?: Yes Does the patient have difficulty dressing or bathing?: Yes Independently performs ADLs?: No Communication: Independent Does the patient have difficulty walking or climbing stairs?: Yes Weakness of Legs: Both Weakness of Arms/Hands: Both  Permission Sought/Granted                  Emotional Assessment Appearance:: Appears stated age Attitude/Demeanor/Rapport: Engaged Affect (typically observed): Appropriate, Calm Orientation: : Oriented to Place, Oriented to Self, Oriented to  Time, Oriented to Situation Alcohol / Substance Use: Not Applicable Psych Involvement: No (comment)  Admission diagnosis:  S/P cervical spinal fusion [Z98.1] Patient Active Problem List   Diagnosis Date Noted  . Hypertension   . Diabetes mellitus without complication ([place])   . HLD (hyperlipidemia)   . Chronic diastolic CHF (congestive heart failure) ([place])   . S/P cervical spinal fusion 01/29/2020  . Weakness   . Spinal  stenosis of lumbar region   . History of progressive weakness   . Claudication ([place])   . Lower extremity weakness 01/14/2020   PCP:  Lorelee New, MD Pharmacy:   [id number] [place] [place] [address]Phone: [phone]: [phone]    Social Determinants of Health (SDOH) Interventions    Readmission Risk Interventions No flowsheet data found.

## 2020-02-01 DIAGNOSIS — Z981 Arthrodesis status: Secondary | ICD-10-CM | POA: Diagnosis not present

## 2020-02-01 LAB — GLUCOSE, CAPILLARY
Glucose-Capillary: 181 mg/dL — ABNORMAL HIGH (ref 70–99)
Glucose-Capillary: 183 mg/dL — ABNORMAL HIGH (ref 70–99)
Glucose-Capillary: 220 mg/dL — ABNORMAL HIGH (ref 70–99)
Glucose-Capillary: 269 mg/dL — ABNORMAL HIGH (ref 70–99)

## 2020-02-01 MED ORDER — INSULIN DETEMIR 100 UNIT/ML ~~LOC~~ SOLN
11.0000 [IU] | Freq: Every day | SUBCUTANEOUS | Status: DC
  Administered 2020-02-01: 11 [IU] via SUBCUTANEOUS
  Filled 2020-02-01 (×2): qty 0.11

## 2020-02-01 MED ORDER — POLYETHYLENE GLYCOL 3350 17 G PO PACK
17.0000 g | PACK | Freq: Every morning | ORAL | Status: DC
  Administered 2020-02-01 – 2020-02-15 (×10): 17 g via ORAL
  Filled 2020-02-01 (×9): qty 1

## 2020-02-01 MED ORDER — POLYETHYLENE GLYCOL 3350 17 GM/SCOOP PO POWD: 17.0000 g | Freq: Every morning | ORAL | Status: DC

## 2020-02-01 NOTE — Progress Notes (Signed)
PROGRESS NOTE    Roy [name].  OIZ:[id number] DOB: 1945/10/09 DOA: 01/29/2020 PCP: Rigoberto Noel, MD  Brief Narrative:  Consulted by: Dr. Adriana Simas of neurosurgery and Autumn Messing (neurosurgery NP);  Reason for Consult: assistance with post-op management of several chronic medical problems, including type 2 diabetes mellitus, hypertension, and chronic diastolic heart failure.   Seen and examined.  Wife remains at bedside.  Mild hypertension noted over interval.  Blood sugars remain out of target range.   Assessment & Plan:   Principal Problem:   S/P cervical spinal fusion Active Problems:   Hypertension   Diabetes mellitus without complication (HCC)   HLD (hyperlipidemia)   Chronic diastolic CHF (congestive heart failure) (HCC)   Post-operative state  Chronic diastolic heart failure Most recent echocardiogram was performed on 01/15/2020 showed evidence of mild LVH, EF 60 to 65%, no evidence of focal wall motion abnormality, and showed grade 1 diastolic dysfunction.   Not on any diuretics as an outpatient.   However Monitor ins and outs and weights  Type 2 diabetes mellitus  Most recent hemoglobin A1c 5.8% on 01/16/2020.   On Metformin as an outpatient as well as the following basal and short-acting insulin regimen: Levemir 12 units subcu nightly as well as sliding scale NovoLog 3 times daily with meals, typically resulting in 4-8 units of NovoLog per meal.  -Wife is a retired Scientist, clinical (histocompatibility and immunogenetics) so keeps close monitoring of his blood pressure and blood sugar as outpatient -Blood sugars have been slowly climbing likely owing to patient's improved p.o. intake Plan: Continue sliding scale, moderate intensity Increase Levemir to 11 units nightly Hold Metformin Carb modified diet Goal blood sugar while hospitalized 140-180  Hyperlipidemia  On simvastatin at home.  Plan: Continue home statin.  Essential hypertension  Amlodipine and Coreg outpatient regimen Per outpatient  cardiology some consideration for lisinopril addition Blood pressures have been mildly elevated postoperatively Plan: Continue amlodipine 5 mg daily Continue carvedilol 6.25 mg twice daily As needed hydralazine Monitor blood pressure per unit protocol Defer addition of a third agent or dose adjustment at this time.  Use as needed hydralazine if needed.  Will consider dosage adjustment or addition of a third agent if blood pressures continue to rise from this point.. Goal SBP under 140   DVT prophylaxis: SCDs Code Status: Full Family Communication: Wife at bedside 10/22 Disposition Plan: Per primary neurosurgical team.  Hospitalist on consult   Consultants:   [facility] hospitalist  Procedures:   Anterior cervical decompression discectomy, 01/29/2020  Antimicrobials:   None   Subjective: Seen and examined.  Wife bedside.  Patient appears pleasant and communicative.  No complaints  Objective: Vitals:   01/31/20 1608 01/31/20 2100 02/01/20 0037 02/01/20 0728  BP: (!) 160/89 (!) 158/93 (!) 149/89 (!) 154/94  Pulse: 66 68 67 70  Resp: 18 18 18 16   Temp: 97.9 F (36.6 C) 98 F (36.7 C) 98.1 F (36.7 C) 97.6 F (36.4 C)  TempSrc: Oral Oral Oral Oral  SpO2: 100% 98% 97% 98%  Weight:      Height:        Intake/Output Summary (Last 24 hours) at 02/01/2020 1018 Last data filed at 02/01/2020 02/03/2020 Gross per 24 hour  Intake 480 ml  Output 2850 ml  Net -2370 ml   Filed Weights   01/29/20 1325  Weight: 115.5 kg    Examination:  General exam: No acute distress Respiratory system: Clear to auscultation. Respiratory effort normal. Cardiovascular system: S1 & S2 heard, RRR.  No JVD, murmurs, rubs, gallops or clicks. No pedal edema. Gastrointestinal system: Obese, nontender, nondistended, normal bowel sounds Central nervous system: Alert and oriented. No focal neurological deficits. Extremities: Symmetric 5 x 5 power. Skin: No rashes, lesions or ulcers Psychiatry: Judgement  and insight appear normal. Mood & affect appropriate.     Data Reviewed: I have personally reviewed following labs and imaging studies  CBC: No results for input(s): WBC, NEUTROABS, HGB, HCT, MCV, PLT in the last 168 hours. Basic Metabolic Panel: No results for input(s): NA, K, CL, CO2, GLUCOSE, BUN, CREATININE, CALCIUM, MG, PHOS in the last 168 hours. GFR: Estimated Creatinine Clearance: 97.9 mL/min (by C-G formula based on SCr of 0.92 mg/dL). Liver Function Tests: No results for input(s): AST, ALT, ALKPHOS, BILITOT, PROT, ALBUMIN in the last 168 hours. No results for input(s): LIPASE, AMYLASE in the last 168 hours. No results for input(s): AMMONIA in the last 168 hours. Coagulation Profile: Recent Labs  Lab 01/25/20 1033  INR 1.0   Cardiac Enzymes: No results for input(s): CKTOTAL, CKMB, CKMBINDEX, TROPONINI in the last 168 hours. BNP (last 3 results) No results for input(s): PROBNP in the last 8760 hours. HbA1C: No results for input(s): HGBA1C in the last 72 hours. CBG: Recent Labs  Lab 01/31/20 0753 01/31/20 1154 01/31/20 1632 01/31/20 2053 02/01/20 0729  GLUCAP 176* 184* 230* 215* 181*   Lipid Profile: No results for input(s): CHOL, HDL, LDLCALC, TRIG, CHOLHDL, LDLDIRECT in the last 72 hours. Thyroid Function Tests: No results for input(s): TSH, T4TOTAL, FREET4, T3FREE, THYROIDAB in the last 72 hours. Anemia Panel: No results for input(s): VITAMINB12, FOLATE, FERRITIN, TIBC, IRON, RETICCTPCT in the last 72 hours. Sepsis Labs: No results for input(s): PROCALCITON, LATICACIDVEN in the last 168 hours.  Recent Results (from the past 240 hour(s))  Respiratory Panel by RT PCR (Flu A&B, Covid) - Nasopharyngeal Swab     Status: None   Collection Time: 01/25/20 10:33 AM   Specimen: Nasopharyngeal Swab  Result Value Ref Range Status   SARS Coronavirus 2 by RT PCR NEGATIVE NEGATIVE Final    Comment: (NOTE) SARS-CoV-2 target nucleic acids are NOT DETECTED.  The  SARS-CoV-2 RNA is generally detectable in upper respiratoy specimens during the acute phase of infection. The lowest concentration of SARS-CoV-2 viral copies this assay can detect is 131 copies/mL. A negative result does not preclude SARS-Cov-2 infection and should not be used as the sole basis for treatment or other patient management decisions. A negative result may occur with  improper specimen collection/handling, submission of specimen other than nasopharyngeal swab, presence of viral mutation(s) within the areas targeted by this assay, and inadequate number of viral copies (<131 copies/mL). A negative result must be combined with clinical observations, patient history, and epidemiological information. The expected result is Negative.  Fact Sheet for Patients:  https://www.moore.com/  Fact Sheet for Healthcare Providers:  https://www.young.biz/  This test is no t yet approved or cleared by the [place] FDA and  has been authorized for detection and/or diagnosis of SARS-CoV-2 by FDA under an Emergency Use Authorization (EUA). This EUA will remain  in effect (meaning this test can be used) for the duration of the COVID-19 declaration under Section 564(b)(1) of the Act, 21 U.S.C. section 360bbb-3(b)(1), unless the authorization is terminated or revoked sooner.     Influenza A by PCR NEGATIVE NEGATIVE Final   Influenza B by PCR NEGATIVE NEGATIVE Final    Comment: (NOTE) The Xpert Xpress SARS-CoV-2/FLU/RSV assay is intended as an aid  in  the diagnosis of influenza from Nasopharyngeal swab specimens and  should not be used as a sole basis for treatment. Nasal washings and  aspirates are unacceptable for Xpert Xpress SARS-CoV-2/FLU/RSV  testing.  Fact Sheet for Patients: https://www.moore.com/  Fact Sheet for Healthcare Providers: https://www.young.biz/  This test is not yet approved or cleared  by the [place] FDA and  has been authorized for detection and/or diagnosis of SARS-CoV-2 by  FDA under an Emergency Use Authorization (EUA). This EUA will remain  in effect (meaning this test can be used) for the duration of the  Covid-19 declaration under Section 564(b)(1) of the Act, 21  U.S.C. section 360bbb-3(b)(1), unless the authorization is  terminated or revoked. Performed at [facility], [address] [place], [place] [address]   SARS CORONAVIRUS 2 (TAT 6-24 HRS) Nasopharyngeal Nasopharyngeal Swab     Status: None   Collection Time: 01/28/20  2:52 PM   Specimen: Nasopharyngeal Swab  Result Value Ref Range Status   SARS Coronavirus 2 NEGATIVE NEGATIVE Final    Comment: (NOTE) SARS-CoV-2 target nucleic acids are NOT DETECTED.  The SARS-CoV-2 RNA is generally detectable in upper and lower respiratory specimens during the acute phase of infection. Negative results do not preclude SARS-CoV-2 infection, do not rule out co-infections with other pathogens, and should not be used as the sole basis for treatment or other patient management decisions. Negative results must be combined with clinical observations, patient history, and epidemiological information. The expected result is Negative.  Fact Sheet for Patients: HairSlick.no  Fact Sheet for Healthcare Providers: quierodirigir.com  This test is not yet approved or cleared by the [place] FDA and  has been authorized for detection and/or diagnosis of SARS-CoV-2 by FDA under an Emergency Use Authorization (EUA). This EUA will remain  in effect (meaning this test can be used) for the duration of the COVID-19 declaration under Se ction 564(b)(1) of the Act, 21 U.S.C. section 360bbb-3(b)(1), unless the authorization is terminated or revoked sooner.  Performed at [facility], [address], [place], [place] [address]          Radiology  Studies: No results found.      Scheduled Meds: . acetaminophen  1,000 mg Oral Q8H  . amLODipine  5 mg Oral Daily  . brimonidine  1 drop Right Eye BID  . carvedilol  6.25 mg Oral BID  . Chlorhexidine Gluconate Cloth  6 each Topical Daily  . docusate sodium  100 mg Oral BID  . enoxaparin (LOVENOX) injection  40 mg Subcutaneous Q24H  . insulin aspart  0-15 Units Subcutaneous TID WC  . insulin detemir  11 Units Subcutaneous QHS  . latanoprost  1 drop Right Eye QHS  . polyethylene glycol  17 g Oral q AM  . ramelteon  8 mg Oral QHS  . senna  2 tablet Oral BID  . simvastatin  20 mg Oral Daily  . sodium chloride flush  3 mL Intravenous Q12H  . tamsulosin  0.4 mg Oral QPC supper   Continuous Infusions: . sodium chloride Stopped (01/30/20 1505)  . methocarbamol (ROBAXIN) IV       LOS: 2 days    Time spent: 15 minutes    Tresa Moore, MD [facility] Pager [phone]  If 7PM-7AM, please contact night-coverage 02/01/2020, 10:18 AM

## 2020-02-01 NOTE — Progress Notes (Signed)
Physical Therapy Treatment Patient Details Name: Roy [name]. MRN: [id number] DOB: 12-19-1945 Today's Date: 02/01/2020    History of Present Illness Roy [name] is a 74 y.o. male who presented to the [facility] emergency department on 01/14/20 with concern for worsening weakness in his lower extremities.  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 He DC'd briefly to SNF to await sx, and is now s/p C 3-4 anterior cervical decompression/discectomy. PMH significant for chronic diastolic heart failure, HTN, HLD, DM2, GERD, and left eye blindness.    PT Comments    Pt lying in bed upon arrival with wife at bedside. Pt agreeable to participate in PT this morning stating that he just finished performing exercises that were performed during yesterday's session. Pt required min A to roll to L side with verbal cues for LE/UE placement. Once in sidelying, required max+2 A to elevate trunk into sitting. Pt performed sitting balance activity with perturbations and able to demonstrate good sitting balance. Pt performed 3 sit <> stand transfers today, two requiring max+2 A and the third with mod+2A. Once pt clears hips from bed, verbal and tactile cues for activating glutes and for hip/trunk extension for improved upright stance. Pt with anterior lean remaining flexed at hips. Pt requested to have the height of the RW lowered and noted to lock out elbows in extension on lowered walker. Lowered height increased pt's anterior lean despite pt preference for the shorter height adjustment. Pt performed weight shifting to both L and R in preparation for attempting steps. Pt then sidestepped to L (a total of 2 feet in 2 attempts) at bedside with max+2 A for blocking of knees while weightbearing and for balance. Max A for walker management and LLE abduction as pt able to clear LLE from ground but noted poor adbuction strength and control. Pt reports 6/10 L knee pain  with weightbearing which is a limiting factor in today's session. Pt returned to bed with max+2 A for trunk and BLE control. Pt positioned for comfort with all needs within reach. Pt continues to demonstrate difficulty maintaining cervical mobility restrictions requiring both verbal and tactile cues. Pt making progress today with ambulation attempt. Continue to recommend CIR to maximize safety and independence with functional mobility.    Follow Up Recommendations  CIR     Equipment Recommendations  None recommended by PT    Recommendations for Other Services       Precautions / Restrictions Precautions Precautions: Fall;Cervical Precaution Booklet Issued: Yes (comment) Required Braces or Orthoses: Other Brace Other Brace: new order for soft cervical brace for pt comfort; bilat knee braces Restrictions Weight Bearing Restrictions: No    Mobility  Bed Mobility Overal bed mobility: Needs Assistance Bed Mobility: Rolling;Sidelying to Sit;Sit to Sidelying Rolling: Min assist Sidelying to sit: Max assist;+2 for physical assistance     Sit to sidelying: Max assist;+2 for physical assistance General bed mobility comments: Min A for rolling with verbal cues for BLE/BUE placement; max+2 A for truncal elevation and for BLE elevation back onto bed  Transfers Overall transfer level: Needs assistance Equipment used: Rolling walker (2 wheeled) Transfers: Sit to/from Stand Sit to Stand: Max assist;+2 physical assistance;From elevated surface;Mod assist         General transfer comment: height of bed raised; pt performed 2 transfers with max+2 A with the 3rd attempt requiring mod+2 A; bilateral knee blocking provided  Ambulation/Gait Ambulation/Gait assistance: Max assist;+2 physical assistance Gait Distance (Feet):  2 Feet Assistive device: Rolling walker (2 wheeled)   Gait velocity: decreased   General Gait Details: pt ambulated 2 feet to the L with max+2 A for bilateral knee  blocking and for balance; pt utilized [employer] (Stroke Patients Only)       Balance Overall balance assessment: Needs assistance Sitting-balance support: No upper extremity supported;Feet supported Sitting balance-Leahy Scale: Good Sitting balance - Comments: SBA for safety; pt able to maintain balance with perturbations Postural control: Other (comment) (anterior lean) Standing balance support: Bilateral upper extremity supported;During functional activity Standing balance-Leahy Scale: Poor Standing balance comment: bilateral knee blocking, BUE on RW, max+2 A for standing balance                            Cognition Arousal/Alertness: Awake/alert Behavior During Therapy: WFL for tasks assessed/performed Overall Cognitive Status: Within Functional Limits for tasks assessed                                        Exercises Other Exercises Other Exercises: weightshifting in standing with BUE on RW and bilateral knee blocking Other Exercises: sitting balance activity with perturbations with pt able to maintain balance    General Comments        Pertinent Vitals/Pain Pain Assessment: 0-10 Pain Score: 6  Pain Location: L knee Pain Descriptors / Indicators: Crushing;Pressure;Discomfort;Sore;Other (Comment) ("crunching") Pain Intervention(s): Monitored during session;Repositioned;Limited activity within patient's tolerance    Home Living                      Prior Function            PT Goals (current goals can now be found in the care plan section) Acute Rehab PT Goals Patient Stated Goal: to get my strength back PT Goal Formulation: With patient/family Time For Goal Achievement: 02/13/20 Potential to Achieve Goals: Fair Progress towards PT goals: Progressing toward goals    Frequency    7X/week      PT Plan Current plan remains appropriate    Co-evaluation               AM-PAC PT "6 Clicks" Mobility   Outcome Measure  Help needed turning from your back to your side while in a flat bed without using bedrails?: A Little Help needed moving from lying on your back to sitting on the side of a flat bed without using bedrails?: A Lot Help needed moving to and from a bed to a chair (including a wheelchair)?: A Lot Help needed standing up from a chair using your arms (e.g., wheelchair or bedside chair)?: A Lot Help needed to walk in hospital room?: Total Help needed climbing 3-5 steps with a railing? : Total 6 Click Score: 11    End of Session Equipment Utilized During Treatment: Gait belt Activity Tolerance: Patient tolerated treatment well;Patient limited by pain Patient left: in bed;with call bell/phone within reach;with family/visitor present; bed alarm set Nurse Communication: Mobility status PT Visit Diagnosis: Unsteadiness on feet (R26.81);Other abnormalities of gait and mobility (R26.89);Repeated falls (R29.6);Muscle weakness (generalized) (M62.81);History of falling (Z91.81);Difficulty in walking, not elsewhere classified (R26.2);Other symptoms and signs involving the nervous system (E99.371)     Time: 6967-8938 PT Time Calculation (  min) (ACUTE ONLY): 41 min  Charges:                        Frederich Chick, SPT   Frederich Chick 02/01/2020, 11:04 AM

## 2020-02-02 LAB — GLUCOSE, CAPILLARY
Glucose-Capillary: 181 mg/dL — ABNORMAL HIGH (ref 70–99)
Glucose-Capillary: 183 mg/dL — ABNORMAL HIGH (ref 70–99)
Glucose-Capillary: 171 mg/dL — ABNORMAL HIGH (ref 70–99)
Glucose-Capillary: 236 mg/dL — ABNORMAL HIGH (ref 70–99)

## 2020-02-02 MED ORDER — INSULIN DETEMIR 100 UNIT/ML ~~LOC~~ SOLN
12.0000 [IU] | Freq: Every day | SUBCUTANEOUS | Status: DC
  Administered 2020-02-02 – 2020-02-14 (×13): 12 [IU] via SUBCUTANEOUS
  Filled 2020-02-02 (×14): qty 0.12

## 2020-02-02 NOTE — Progress Notes (Signed)
Physical Therapy Treatment Patient Details Name: Roy [name]. MRN: [id number] DOB: May 18, 1945 Today's Date: 02/02/2020    History of Present Illness Roy [name] is a 74 y.o. male who presented to the [facility] emergency department on 01/14/20 with concern for worsening weakness in his lower extremities.  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 He DC'd briefly to SNF to await sx, and is now s/p C 3-4 anterior cervical decompression/discectomy. PMH significant for chronic diastolic heart failure, HTN, HLD, DM2, GERD, and left eye blindness.    PT Comments    PT/OT co treat 2/2 to requiring +2 assist and due to pt's poor activity tolerance. Pt was able to exit L side of bed via log roll technique with +2 assist for safety. Has BLE knee braces donned. Stood EOB several time with +2 assist with protecting L knee from buckling. Was able to progress to side stepping from Fob to [facility] with +2 assist. Pt is limited by fatigue. Recommend continued skilled PT at DC to continue to progress pt towards PLOF. Will benefit form SNF.    Follow Up Recommendations  CIR     Equipment Recommendations  None recommended by PT    Recommendations for Other Services       Precautions / Restrictions Precautions Precautions: Fall;Cervical Precaution Booklet Issued: Yes (comment) Required Braces or Orthoses: Other Brace Other Brace: new order for soft cervical brace for pt comfort; bilat knee braces Restrictions Weight Bearing Restrictions: No Other Position/Activity Restrictions: B shoulder pain/weakness limting UE assist.    Mobility  Bed Mobility Overal bed mobility: Needs Assistance Bed Mobility: Rolling;Sidelying to Sit;Sit to Sidelying Rolling: Min assist Sidelying to sit: Max assist;+2 for physical assistance Supine to sit: Max assist;+2 for physical assistance Sit to supine: Max assist;+2 for physical assistance Sit to sidelying: Max  assist General bed mobility comments: Cueing for sequencing log roll technique.  Transfers Overall transfer level: Needs assistance Equipment used: Rolling walker (2 wheeled) Transfers: Sit to/from Stand Sit to Stand: Mod assist;Min assist;+2 physical assistance;From elevated surface         General transfer comment: height of bed raised; pt able to perform STS x2 given +2 MIN/MOD A and cueing for sequencing/foot placement. Pt fearful of falling and moderately self-limiting t/o session. Requires consistent encouragement that he is doing well/the right thing, but is very motivated to participate in therapy session.  Ambulation/Gait Ambulation/Gait assistance: Max assist;+2 physical assistance Gait Distance (Feet): 3 Feet Assistive device: Rolling walker (2 wheeled)   Gait velocity: decreased   General Gait Details: side step from FOB to [facility]       Balance Overall balance assessment: Needs assistance Sitting-balance support: No upper extremity supported;Feet supported Sitting balance-Leahy Scale: Good Sitting balance - Comments: Steady static sitting, reaching within BOS. Pt performs BUE exercises while seated EOB, no LOB appreciated.   Standing balance support: Bilateral upper extremity supported;During functional activity Standing balance-Leahy Scale: Fair Standing balance comment: Pt requires BUE support on RW,  is able to weight shift while standing EOB no LOB appreciated.       Cognition Arousal/Alertness: Awake/alert Behavior During Therapy: WFL for tasks assessed/performed Overall Cognitive Status: Within Functional Limits for tasks assessed      General Comments: A&O x 4      Exercises Other Exercises Other Exercises: OT/PT facilitate functional mobility including bed mobility, STS x2, weightshifting in standing with BUE on RW and side-stepping at EOB. Pt requires consistent cueing for  safety/sequencing and adherence to cervical precautions.        Pertinent  Vitals/Pain Pain Assessment: No/denies pain Pain Intervention(s): Monitored during session           PT Goals (current goals can now be found in the care plan section) Acute Rehab PT Goals Patient Stated Goal: to get my strength back Progress towards PT goals: Progressing toward goals    Frequency    7X/week      PT Plan Current plan remains appropriate    Co-evaluation PT/OT/SLP Co-Evaluation/Treatment: Yes Reason for Co-Treatment: For patient/therapist safety;To address functional/ADL transfers PT goals addressed during session: Mobility/safety with mobility;Balance;Proper use of DME OT goals addressed during session: ADL's and self-care;Proper use of Adaptive equipment and DME      AM-PAC PT "6 Clicks" Mobility   Outcome Measure  Help needed turning from your back to your side while in a flat bed without using bedrails?: A Little Help needed moving from lying on your back to sitting on the side of a flat bed without using bedrails?: A Lot Help needed moving to and from a bed to a chair (including a wheelchair)?: A Lot Help needed standing up from a chair using your arms (e.g., wheelchair or bedside chair)?: A Lot Help needed to walk in hospital room?: Total Help needed climbing 3-5 steps with a railing? : Total 6 Click Score: 11    End of Session Equipment Utilized During Treatment: Gait belt Activity Tolerance: Patient tolerated treatment well;Patient limited by pain Patient left: in bed;with call bell/phone within reach;with family/visitor present;with bed alarm set Nurse Communication: Mobility status PT Visit Diagnosis: Unsteadiness on feet (R26.81);Other abnormalities of gait and mobility (R26.89);Repeated falls (R29.6);Muscle weakness (generalized) (M62.81);History of falling (Z91.81);Difficulty in walking, not elsewhere classified (R26.2);Other symptoms and signs involving the nervous system (R29.898)     Time: 7425-9563 PT Time Calculation (min) (ACUTE  ONLY): 38 min  Charges:  $Therapeutic Activity: 23-37 mins                     Jetta Lout PTA 02/02/20, 5:39 PM

## 2020-02-02 NOTE — Progress Notes (Signed)
Procedure: C3-4 ACDF Procedure date: 01/29/2020 Diagnosis: cervi al radiculopathy    History: Roy [name]. is s/p C3-4 ACDF POD4: [name] is doing well s/p ACDF.  He does feel like his hand strength has improved and has less pain.  He continues to have numbness.  He did stand with physical therapy yesterday but had some difficulty with pain in his left knee.  He does feel he is improving with them.  He has no difficulty swallowing.  He is not complaining of any significant neck pain.   Physical Exam: Vitals:   02/01/20 2250 02/02/20 0718  BP: 127/75 136/86  Pulse: (!) 51 60  Resp: 17 14  Temp: 97.7 F (36.5 C) 97.9 F (36.6 C)  SpO2: 98% 96%    General: alert and orietned, lying in bed Strength:5/5 throughout  Sensation: Intact in bilateral lower extremities, decreased in bilateral hands Skin: Incision C/D/I, neck is soft   Assessment/Plan:  Roy [name]. is POD4 s/p ACDF C3-4.  He is doing well and is safe for discharge from a medical standpoint.   #ACDF C3-4 - mobilize - pain control - PTOT, will need rehab placement - Imaging reviewed, good alignment, no complications  #DVT Prophylaxis - DVT prophylaxis Lovenox 40mg  subq qd - SCDs - Ambulation  ##DM2, CHF, CAD - Continue home medications, appreciate medical team assistance with management of comorbid conditions  Disposition per case management.    , MD

## 2020-02-02 NOTE — Progress Notes (Signed)
Occupational Therapy Treatment Patient Details Name: Roy [name]. MRN: [id number] DOB: 09/28/1945 Today's Date: 02/02/2020    History of present illness Roy [name] is a 74 y.o. male who presented to the [facility] emergency department on 01/14/20 with concern for worsening weakness in his lower extremities.  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 He DC'd briefly to SNF to await sx, and is now s/p C 3-4 anterior cervical decompression/discectomy. PMH significant for chronic diastolic heart failure, HTN, HLD, DM2, GERD, and left eye blindness.   OT comments  Roy [name] was seen for OT/Roy [name] Co-tx session this date. Units split per protocols. Roy [name] is received semi-supine in bed with wife present at bedside. Roy [name] denies pain but endorses feeling "sluggish" today. Despite fatigue he remains motivated to participate t/o session. He is eager to progress functional mobility. Roy [name] requires Total A to don bilat knee braces, MAX A for log roll technique bed mobility, Min/Mod A +2 for STS transfers and side stepping at EOB. See ADL section below for additional detail regarding occupational performance. Roy [name] making good progress toward goals. Roy [name] continues to benefit from skilled OT services to maximize return to PLOF and minimize risk of future falls, injury, caregiver burden, and readmission. Will continue to follow POC. Discharge recommendation for acute inpatient rehab remains appropriate.    Follow Up Recommendations  CIR    Equipment Recommendations  3 in 1 bedside commode;Other (comment) (BARI BSC)    Recommendations for Other Services Rehab consult    Precautions / Restrictions Precautions Precautions: Fall;Cervical Precaution Booklet Issued: Yes (comment) Required Braces or Orthoses: Other Brace Other Brace: new order for soft cervical brace for Roy [name] comfort; bilat knee braces Restrictions Weight Bearing Restrictions: No Other  Position/Activity Restrictions: B shoulder pain/weakness limting UE assist.       Mobility Bed Mobility Overal bed mobility: Needs Assistance Bed Mobility: Rolling;Sidelying to Sit;Sit to Sidelying Rolling: Min assist Sidelying to sit: Max assist;+2 for physical assistance     Sit to sidelying: Max assist General bed mobility comments: Cueing for sequencing log roll technique.  Transfers Overall transfer level: Needs assistance Equipment used: Rolling walker (2 wheeled) Transfers: Sit to/from Stand Sit to Stand: Mod assist;Min assist;+2 physical assistance;From elevated surface         General transfer comment: height of bed raised; Roy [name] able to perform STS x2 given +2 MIN/MOD A and cueing for sequencing/foot placement. Roy [name] fearful of falling and moderately self-limiting t/o session. Requires consistent encouragement that he is doing well/the right thing, but is very motivated to participate in therapy session.    Balance Overall balance assessment: Needs assistance Sitting-balance support: No upper extremity supported;Feet supported Sitting balance-Leahy Scale: Good Sitting balance - Comments: Steady static sitting, reaching within BOS. Roy [name] performs BUE exercises while seated EOB, no LOB appreciated.   Standing balance support: Bilateral upper extremity supported;During functional activity Standing balance-Leahy Scale: Fair Standing balance comment: Roy [name] requires BUE support on RW,  is able to weight shift while standing EOB no LOB appreciated.                           ADL either performed or assessed with clinical judgement   ADL Overall ADL's : Needs assistance/impaired  Functional mobility during ADLs: Rolling walker;Moderate assistance;Minimal assistance;+2 for physical assistance General ADL Comments: Session today focused on functional mobility. Roy [name] demonstrates improved overall strength and performance with  BLE, however, is fearful of falling and self-limits t/o session. Requires consistent cueing for safety/sequencing during session. He is able to perform log roll for bed mobility given MAX A, STS x2 given +2 MIN/MOD A using RW, and is able to march in place and side-step at EOB with MIN A +2 and assist to block L knee. Roy [name] and caregiver state he continues to require MAX A for self feeding,     Vision Baseline Vision/History: Glaucoma;Wears glasses Patient Visual Report: No change from baseline     Perception     Praxis      Cognition Arousal/Alertness: Awake/alert Behavior During Therapy: WFL for tasks assessed/performed Overall Cognitive Status: Within Functional Limits for tasks assessed                                          Exercises Other Exercises Other Exercises: OT/Roy [name] facilitate functional mobility including bed mobility, STS x2, weightshifting in standing with BUE on RW and side-stepping at EOB. Roy [name] requires consistent cueing for safety/sequencing and adherence to cervical precautions.   Shoulder Instructions       General Comments      Pertinent Vitals/ Pain       Pain Assessment: No/denies pain Pain Intervention(s): Monitored during session  Home Living                                          Prior Functioning/Environment              Frequency  Min 3X/week        Progress Toward Goals  OT Goals(current goals can now be found in the care plan section)  Progress towards OT goals: Progressing toward goals  Acute Rehab OT Goals Patient Stated Goal: to get my strength back OT Goal Formulation: With patient/family Time For Goal Achievement: 02/13/20 Potential to Achieve Goals: Good  Plan Discharge plan remains appropriate;Frequency remains appropriate    Co-evaluation    Roy [name]/OT/SLP Co-Evaluation/Treatment: Yes Reason for Co-Treatment: For patient/therapist safety;To address functional/ADL transfers Roy [name] goals  addressed during session: Mobility/safety with mobility;Balance;Proper use of DME OT goals addressed during session: ADL's and self-care;Proper use of Adaptive equipment and DME      AM-PAC OT "6 Clicks" Daily Activity     Outcome Measure   Help from another person eating meals?: A Lot Help from another person taking care of personal grooming?: A Lot Help from another person toileting, which includes using toliet, bedpan, or urinal?: A Lot Help from another person bathing (including washing, rinsing, drying)?: A Lot Help from another person to put on and taking off regular upper body clothing?: A Lot Help from another person to put on and taking off regular lower body clothing?: A Lot 6 Click Score: 12    End of Session Equipment Utilized During Treatment: Gait belt  OT Visit Diagnosis: Unsteadiness on feet (R26.81);Muscle weakness (generalized) (M62.81)   Activity Tolerance Patient tolerated treatment well;Other (comment)   Patient Left in bed;with call bell/phone within reach;with bed alarm set;with family/visitor present   Nurse Communication          Time: 6269-4854 OT  Time Calculation (min): 38 min  Charges: OT General Charges $OT Visit: 1 Visit OT Treatments $Self Care/Home Management : 8-22 mins  Rockney Ghee, M.S., OTR/L Ascom: [phone] 02/02/20, 4:05 PM

## 2020-02-02 NOTE — Progress Notes (Signed)
Vitals and blood sugars reviewed. BP reasonable control Blood glucose slightly out of range  Recs: Continue current anti-hypertensive regimen Increase Lantus to 12U nightly Moderate sliding scale  [facility] hospitalist will continue to follow Thank you for the consult  No charge  Lolita Patella MD

## 2020-02-03 LAB — GLUCOSE, CAPILLARY
Glucose-Capillary: 166 mg/dL — ABNORMAL HIGH (ref 70–99)
Glucose-Capillary: 217 mg/dL — ABNORMAL HIGH (ref 70–99)
Glucose-Capillary: 173 mg/dL — ABNORMAL HIGH (ref 70–99)
Glucose-Capillary: 210 mg/dL — ABNORMAL HIGH (ref 70–99)

## 2020-02-03 MED ORDER — MELATONIN 5 MG PO TABS
5.0000 mg | ORAL_TABLET | Freq: Every day | ORAL | Status: DC
  Administered 2020-02-03: 5 mg via ORAL
  Filled 2020-02-03: qty 1

## 2020-02-03 NOTE — Progress Notes (Signed)
Received report from Hannah, RN. Assuming care of patient at this time.  

## 2020-02-03 NOTE — Progress Notes (Signed)
Physical Therapy Treatment Patient Details Name: Roy [name]. MRN: [id number] DOB: 1946/02/17 Today's Date: 02/03/2020    History of Present Illness Roy [name] is a 74 y.o. male who presented to the [facility] emergency department on 01/14/20 with concern for worsening weakness in his lower extremities.  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 He DC'd briefly to SNF to await sx, and is now s/p C 3-4 anterior cervical decompression/discectomy. PMH significant for chronic diastolic heart failure, HTN, HLD, DM2, GERD, and left eye blindness.    PT Comments    Pt recently finished HEP on his own.  Did x 10 LE ex again prior to mobility.  To EOB with max a x 1 and light assist from wife.  Cues for rolling and hand placements.  Sitting EOB x 30 minutes with emphasis on LE, UE and trunk exercises.  Occasional rest breaks resting on his knees.  Aware and attentive to neck precautions during session.  Overall tolerated well.  Standing mobility deferred today due to inability to coordinate care for safe +2 assist needed for standing activities.   Follow Up Recommendations  CIR     Equipment Recommendations  None recommended by PT    Recommendations for Other Services       Precautions / Restrictions Precautions Precautions: Fall;Cervical Precaution Booklet Issued: Yes (comment) Required Braces or Orthoses: Other Brace Other Brace: new order for soft cervical brace for pt comfort; bilat knee braces Restrictions Other Position/Activity Restrictions: B shoulder pain/weakness limting UE assist.    Mobility  Bed Mobility Overal bed mobility: Needs Assistance Bed Mobility: Rolling;Sidelying to Sit;Sit to Sidelying Rolling: Mod assist Sidelying to sit: Max assist Supine to sit: Max assist Sit to supine: Max assist Sit to sidelying: Max assist General bed mobility comments: Cueing for sequencing log roll technique.  Transfers                  General transfer comment: deferred due to only +1 available  Ambulation/Gait                 Stairs             Wheelchair Mobility    Modified Rankin (Stroke Patients Only)       Balance Overall balance assessment: Needs assistance Sitting-balance support: No upper extremity supported;Feet supported Sitting balance-Leahy Scale: Good Sitting balance - Comments: Steady static sitting, reaching within BOS. Pt performs BUE exercises while seated EOB, no LOB appreciated.                                    Cognition Arousal/Alertness: Awake/alert Behavior During Therapy: WFL for tasks assessed/performed Overall Cognitive Status: Within Functional Limits for tasks assessed                                        Exercises Other Exercises Other Exercises: sitting EOB x 30 minutes with LE, UE and trunk exercises    General Comments        Pertinent Vitals/Pain Pain Assessment: No/denies pain    Home Living                      Prior Function            PT Goals (  current goals can now be found in the care plan section) Progress towards PT goals: Progressing toward goals    Frequency    7X/week      PT Plan Current plan remains appropriate    Co-evaluation              AM-PAC PT "6 Clicks" Mobility   Outcome Measure  Help needed turning from your back to your side while in a flat bed without using bedrails?: A Little Help needed moving from lying on your back to sitting on the side of a flat bed without using bedrails?: Total Help needed moving to and from a bed to a chair (including a wheelchair)?: A Lot Help needed standing up from a chair using your arms (e.g., wheelchair or bedside chair)?: A Lot Help needed to walk in hospital room?: Total Help needed climbing 3-5 steps with a railing? : Total 6 Click Score: 10    End of Session   Activity Tolerance: Patient tolerated  treatment well Patient left: in bed;with call bell/phone within reach;with family/visitor present;with bed alarm set;with nursing/sitter in room Nurse Communication: Mobility status       Time: 7510-2585 PT Time Calculation (min) (ACUTE ONLY): 45 min  Charges:  $Therapeutic Exercise: 8-22 mins $Therapeutic Activity: 23-37 mins                    Danielle Dess, PTA 02/03/20, 10:27 AM

## 2020-02-03 NOTE — Progress Notes (Signed)
This RN was notified by tele that pt was bradycardic with a heart rate in the 30s. It was not sustained. Pt is now back in the 50s. Adriana Simas, MD notified.  Arlana Hove, RN

## 2020-02-04 DIAGNOSIS — Z981 Arthrodesis status: Secondary | ICD-10-CM | POA: Diagnosis not present

## 2020-02-04 LAB — GLUCOSE, CAPILLARY
Glucose-Capillary: 131 mg/dL — ABNORMAL HIGH (ref 70–99)
Glucose-Capillary: 153 mg/dL — ABNORMAL HIGH (ref 70–99)
Glucose-Capillary: 143 mg/dL — ABNORMAL HIGH (ref 70–99)
Glucose-Capillary: 165 mg/dL — ABNORMAL HIGH (ref 70–99)

## 2020-02-04 MED ORDER — MELATONIN 5 MG PO TABS
10.0000 mg | ORAL_TABLET | Freq: Every day | ORAL | Status: DC
  Administered 2020-02-04 – 2020-02-14 (×11): 10 mg via ORAL
  Filled 2020-02-04 (×11): qty 2

## 2020-02-04 MED ORDER — LINAGLIPTIN 5 MG PO TABS
5.0000 mg | ORAL_TABLET | Freq: Every day | ORAL | Status: DC
  Administered 2020-02-04 – 2020-02-15 (×12): 5 mg via ORAL
  Filled 2020-02-04 (×12): qty 1

## 2020-02-04 NOTE — TOC Progression Note (Signed)
Transition of Care (TOC) - Progression Note    Patient Details  Name: Roy [name]. MRN: [id number] Date of Birth: [birth year]  Transition of Care [facility]) CM/SW Contact  Trenton Founds, RN Phone Number: 02/04/2020, 9:32 AM  Clinical Narrative:   RNCM spoke with and re-faxed clinical information to [facility] at [facility] at [phone].     Expected Discharge Plan: IP Rehab Facility Barriers to Discharge: Continued Medical Work up  Expected Discharge Plan and Services Expected Discharge Plan: IP Rehab Facility     Post Acute Care Choice: IP Rehab Living arrangements for the past 2 months: Single Family Home                                       Social Determinants of Health (SDOH) Interventions    Readmission Risk Interventions No flowsheet data found.

## 2020-02-04 NOTE — Care Management Important Message (Signed)
Important Message  Patient Details  Name: Roy [name]. MRN: [id number] Date of Birth: 1945/10/24   Medicare Important Message Given:  Yes     Olegario Messier A Patrcia Schnepp 02/04/2020, 11:05 AM

## 2020-02-04 NOTE — Progress Notes (Signed)
Procedure: C3-4 ACDF Procedure date: 01/29/2020 Diagnosis: cervical radiculopathy    History: Roy [name]. is s/p C3-4 ACDF POD6: Roy [name] was seen this morning, sleepy but arousable. He denies pain, his only complaint is that he didn't sleep well last night.  He is still pending disposition.   POD4: Roy [name] is doing well s/p ACDF.  He does feel like his hand strength has improved and has less pain.  He continues to have numbness.  He did stand with physical therapy yesterday but had some difficulty with pain in his left knee.  He does feel he is improving with them.  He has no difficulty swallowing.  He is not complaining of any significant neck pain.   Physical Exam: Vitals:   02/03/20 2341 02/04/20 0606  BP: 131/80 123/77  Pulse: 64 66  Resp: 15 20  Temp: 98.2 F (36.8 C) 97.6 F (36.4 C)  SpO2: 99% 96%    General: drowsy, lying in bed Strength:5/5 throughout, effort dependent Sensation: Intact in bilateral lower extremities, decreased in bilateral hands Skin: Incision C/D/I, neck is soft   Assessment/Plan:  Roy [name]. is POD6 s/p ACDF C3-4.  He is doing well and is safe for discharge from a medical and surgical standpoint. We are awaiting disposition planning from case management.    #ACDF C3-4 - mobilize - pain control - PTOT, will need rehab placement - Imaging reviewed, good alignment, no complications  #DVT Prophylaxis - DVT prophylaxis Lovenox 40mg  subq qd - SCDs - Ambulation  ##DM2, CHF, CAD - Continue home medications, appreciate medical team assistance with management of comorbid conditions - Carb modified diet - Will reach out to medicine team for improved glucose control with goal blood sugars < 180  Disposition per case management.   , NP Neurosurgery

## 2020-02-04 NOTE — Progress Notes (Signed)
Occupational Therapy Treatment Patient Details Name: Roy [name]. MRN: [id number] DOB: 03/05/1946 Today's Date: 02/04/2020    History of present illness Roy [name]" Roy [name] is a 74 y.o. male who presented to the [facility] emergency department on 01/14/20 with concern for worsening weakness in his lower extremities.  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 He DC'd briefly to SNF to await sx, and is now s/p C 3-4 anterior cervical decompression/discectomy. PMH significant for chronic diastolic heart failure, HTN, HLD, DM2, GERD, and left eye blindness.   OT comments  Roy [name] continues to make progress toward his functional goals.  Pt reports increased fatigue this date due to not sleeping well last night.  He deferred mobility practice and requested to wait until working with physical therapy due to increased fatigue.  Pt agreeable to participation in BUE exercises for increased ROM and strength for functional tasks.  OTR educated pt on exercises for shoulder flexion and abduction.  OTR also provided PROM for BUE shoulder flexion.  Pt presents with full PROM but is limited in active shoulder flexion and abduction, LUE more limited than RUE.  Pt also presents with decreased grip strength, RUE more limited than LUE.  OTR provided moderate physical assist for pt to simulate washing face with L hand, minimal physical assist to wash face with R hand.  OTR educated pt and his wife on completing seated grooming/feeding tasks as independently as possible in order to increase strength and AROM.  Pt and wife verbalized understanding.  OTR provided moderate assist for pt to complete logrolling in bed to assist with pt comfort and positioning, as well as max assist x2 for shifting pt towards head of bed.  Roy [name] will continue to benefit from skilled OT services in acute setting to address functional strengthening, endurance, FMC, and safety and  independence in ADLs.  CIR remains most appropriate discharge recommendation.   Follow Up Recommendations  CIR    Equipment Recommendations  3 in 1 bedside commode;Other (comment) [name])    Recommendations for Other Services Rehab consult    Precautions / Restrictions Precautions Precautions: Fall;Cervical Precaution Booklet Issued: Yes (comment) Required Braces or Orthoses: Other Brace Other Brace: new order for soft cervical brace for pt comfort; bilat knee braces Restrictions Weight Bearing Restrictions: No Other Position/Activity Restrictions: B shoulder pain/weakness limting UE assist.       Mobility Bed Mobility Overal bed mobility: Needs Assistance Bed Mobility: Rolling Rolling: Mod assist            Transfers                 General transfer comment: deferred due to pt requesting to wait for physical therapy session    Balance                                           ADL either performed or assessed with clinical judgement   ADL Overall ADL's : Needs assistance/impaired Eating/Feeding: Sitting;Bed level;With caregiver independent assisting;Moderate assistance Eating/Feeding Details (indicate cue type and reason): Pt limited by cervical precautions, unable to utlize learned compensatory stratgies for decreased shoulder flexion and FMC. Spouse has been assisting with all meals since admission. Grooming: Sitting;Cueing for compensatory techniques;Cueing for safety;Moderate assistance Grooming Details (indicate cue type and reason): OTR provided mod physical assist for pt to simulate face washing  with L hand.  Pt limited by poor grasp and decreased shoulder/wrist ROM needed to angle/reach.  Pt able to wash face with R hand with min A. Upper Body Bathing: Moderate assistance;Sitting;Cueing for safety;Cueing for compensatory techniques Upper Body Bathing Details (indicate cue type and reason): pt requires hand over hand assist to grip  wash cloth/towel d/t weak grasp and limited shoulder/wrist ROM Lower Body Bathing: Cueing for back precautions;Moderate assistance;Sitting/lateral leans   Upper Body Dressing : Sitting;Maximal assistance;Cueing for safety;Cueing for compensatory techniques   Lower Body Dressing: Cueing for compensatory techniques;+2 for physical assistance;Maximal assistance;Sit to/from stand       Toileting- Architect and Hygiene: Bed level;Maximal assistance Toileting - Clothing Manipulation Details (indicate cue type and reason): Pt is able to roll side-to side with Min A cueing for cervical precautions.       General ADL Comments: Session focused on BUE mobility/strengthening, as well as functional independence in UB ADLs     Vision Baseline Vision/History: Glaucoma;Wears glasses (L eye blindness) Patient Visual Report: No change from baseline     Perception     Praxis      Cognition Arousal/Alertness: Awake/alert Behavior During Therapy: WFL for tasks assessed/performed Overall Cognitive Status: Within Functional Limits for tasks assessed                                 General Comments: A&O x 4        Exercises Other Exercises Other Exercises: OTR facilitated BUE exercises to increase ROM and strengthening, provided PROM to BUE for shoulder flexion.   Shoulder Instructions       General Comments      Pertinent Vitals/ Pain       Pain Assessment: No/denies pain Pain Intervention(s): Monitored during session;Limited activity within patient's tolerance  Home Living                                          Prior Functioning/Environment              Frequency  Min 3X/week        Progress Toward Goals  OT Goals(current goals can now be found in the care plan section)  Progress towards OT goals: Progressing toward goals  Acute Rehab OT Goals Patient Stated Goal: to get my strength back OT Goal Formulation: With  patient/family Time For Goal Achievement: 02/13/20 Potential to Achieve Goals: Good  Plan Discharge plan remains appropriate;Frequency remains appropriate    Co-evaluation                 AM-PAC OT "6 Clicks" Daily Activity     Outcome Measure   Help from another person eating meals?: A Lot Help from another person taking care of personal grooming?: A Little Help from another person toileting, which includes using toliet, bedpan, or urinal?: A Lot Help from another person bathing (including washing, rinsing, drying)?: A Lot Help from another person to put on and taking off regular upper body clothing?: A Lot Help from another person to put on and taking off regular lower body clothing?: A Lot 6 Click Score: 13    End of Session    OT Visit Diagnosis: Unsteadiness on feet (R26.81);Muscle weakness (generalized) (M62.81)   Activity Tolerance Patient limited by fatigue   Patient Left in bed;with call bell/phone within  reach;with family/visitor present   Nurse Communication          Time: [phone] OT Time Calculation (min): 21 min  Charges: OT General Charges $OT Visit: 1 Visit OT Treatments $Therapeutic Activity: 8-22 mins  Kathyrn Drown Holley Wirt, OTR/L 02/04/20, 10:23 AM

## 2020-02-04 NOTE — Progress Notes (Signed)
PROGRESS NOTE    Roy [name].  GEX:[id number] DOB: [birth year] DOA: 01/29/2020 PCP: Rigoberto Noel, MD  Brief Narrative:  Consulted by: Dr. Adriana Simas of neurosurgery and Autumn Messing (neurosurgery NP);  Reason for Consult: assistance with post-op management of several chronic medical problems, including type 2 diabetes mellitus, hypertension, and chronic diastolic heart failure.   Patient seen and examined this morning.  Wife at bedside.  Blood pressure well controlled over interval.  Blood sugars for the most part at goal however intermittently spikes above 200.  Patient endorses poor sleep secondary to back pain attributed to the bed.  Plan for CIR at time of discharge.   Assessment & Plan:   Principal Problem:   S/P cervical spinal fusion Active Problems:   Hypertension   Diabetes mellitus without complication (HCC)   HLD (hyperlipidemia)   Chronic diastolic CHF (congestive heart failure) (HCC)   Post-operative state  Chronic diastolic heart failure Most recent echocardiogram was performed on 01/15/2020 showed evidence of mild LVH, EF 60 to 65%, no evidence of focal wall motion abnormality, and showed grade 1 diastolic dysfunction.   Not on any diuretics as an outpatient.   However Monitor ins and outs and weights Medication with diuretics currently  Type 2 diabetes mellitus  Most recent hemoglobin A1c 5.8% on 01/16/2020.   On Metformin as an outpatient as well as the following basal and short-acting insulin regimen: Levemir 12 units subcu nightly as well as sliding scale NovoLog 3 times daily with meals, typically resulting in 4-8 units of NovoLog per meal.  -Wife is a retired Scientist, clinical (histocompatibility and immunogenetics) so keeps close monitoring of his blood pressure and blood sugar as outpatient -Blood sugars have been slowly climbing likely owing to patient's improved p.o. intake Plan: Continue sliding scale, moderate intensity Levemir 12 units nightly.  This is home dose Hold Metformin Carb modified  diet Goal blood sugar while hospitalized 140-180 Can consider addition of Tradjenta while admitted Overall would not recommend any marked change and his diabetic regimen  Hyperlipidemia  On simvastatin at home.  Plan: Continue home statin.  Essential hypertension  Amlodipine and Coreg outpatient regimen Per outpatient cardiology some consideration for lisinopril addition Blood pressures have been mildly elevated postoperatively Plan: Continue amlodipine 5 mg daily Continue carvedilol 6.25 mg twice daily As needed hydralazine Monitor blood pressure per unit protocol Defer addition of a third agent or dose adjustment at this time.  Use as needed hydralazine if needed.  Will consider dosage adjustment or addition of a third agent if blood pressures continue to rise from this point.. Goal SBP under 140   DVT prophylaxis: SCDs Code Status: Full Family Communication: Wife at bedside 10/25 Disposition Plan: Per primary neurosurgical team.  Hospitalist on consult   Consultants:   [facility] hospitalist  Procedures:   Anterior cervical decompression discectomy, 01/29/2020  Antimicrobials:   None   Subjective: Seen and examined.  Wife bedside.  Patient endorsing poor sleep and back pain.  Objective: Vitals:   02/03/20 1437 02/03/20 2341 02/04/20 0606 02/04/20 0745  BP: 134/76 131/80 123/77 119/78  Pulse: (!) 58 64 66 (!) 58  Resp: 16 15 20 17   Temp: 97.6 F (36.4 C) 98.2 F (36.8 C) 97.6 F (36.4 C) (!) 97.5 F (36.4 C)  TempSrc: Oral Oral Oral Oral  SpO2: 97% 99% 96% 96%  Weight:      Height:        Intake/Output Summary (Last 24 hours) at 02/04/2020 1059 Last data filed at 02/04/2020 [phone]  Gross per 24 hour  Intake 3 ml  Output 1900 ml  Net -1897 ml   Filed Weights   01/29/20 1325  Weight: 115.5 kg    Examination:  General exam: No acute distress Respiratory system: Clear to auscultation. Respiratory effort normal. Cardiovascular system: S1 & S2 heard,  RRR. No JVD, murmurs, rubs, gallops or clicks. No pedal edema. Gastrointestinal system: Obese, nontender, nondistended, normal bowel sounds Central nervous system: Alert and oriented. No focal neurological deficits. Extremities: Symmetric 5 x 5 power. Skin: No rashes, lesions or ulcers Psychiatry: Judgement and insight appear normal. Mood & affect appropriate.     Data Reviewed: I have personally reviewed following labs and imaging studies  CBC: No results for input(s): WBC, NEUTROABS, HGB, HCT, MCV, PLT in the last 168 hours. Basic Metabolic Panel: No results for input(s): NA, K, CL, CO2, GLUCOSE, BUN, CREATININE, CALCIUM, MG, PHOS in the last 168 hours. GFR: Estimated Creatinine Clearance: 97.9 mL/min (by C-G formula based on SCr of 0.92 mg/dL). Liver Function Tests: No results for input(s): AST, ALT, ALKPHOS, BILITOT, PROT, ALBUMIN in the last 168 hours. No results for input(s): LIPASE, AMYLASE in the last 168 hours. No results for input(s): AMMONIA in the last 168 hours. Coagulation Profile: No results for input(s): INR, PROTIME in the last 168 hours. Cardiac Enzymes: No results for input(s): CKTOTAL, CKMB, CKMBINDEX, TROPONINI in the last 168 hours. BNP (last 3 results) No results for input(s): PROBNP in the last 8760 hours. HbA1C: No results for input(s): HGBA1C in the last 72 hours. CBG: Recent Labs  Lab 02/03/20 0728 02/03/20 1150 02/03/20 1647 02/03/20 2046 02/04/20 0752  GLUCAP 166* 217* 173* 210* 131*   Lipid Profile: No results for input(s): CHOL, HDL, LDLCALC, TRIG, CHOLHDL, LDLDIRECT in the last 72 hours. Thyroid Function Tests: No results for input(s): TSH, T4TOTAL, FREET4, T3FREE, THYROIDAB in the last 72 hours. Anemia Panel: No results for input(s): VITAMINB12, FOLATE, FERRITIN, TIBC, IRON, RETICCTPCT in the last 72 hours. Sepsis Labs: No results for input(s): PROCALCITON, LATICACIDVEN in the last 168 hours.  Recent Results (from the past 240 hour(s))   SARS CORONAVIRUS 2 (TAT 6-24 HRS) Nasopharyngeal Nasopharyngeal Swab     Status: None   Collection Time: 01/28/20  2:52 PM   Specimen: Nasopharyngeal Swab  Result Value Ref Range Status   SARS Coronavirus 2 NEGATIVE NEGATIVE Final    Comment: (NOTE) SARS-CoV-2 target nucleic acids are NOT DETECTED.  The SARS-CoV-2 RNA is generally detectable in upper and lower respiratory specimens during the acute phase of infection. Negative results do not preclude SARS-CoV-2 infection, do not rule out co-infections with other pathogens, and should not be used as the sole basis for treatment or other patient management decisions. Negative results must be combined with clinical observations, patient history, and epidemiological information. The expected result is Negative.  Fact Sheet for Patients: HairSlick.no  Fact Sheet for Healthcare Providers: quierodirigir.com  This test is not yet approved or cleared by the [place] FDA and  has been authorized for detection and/or diagnosis of SARS-CoV-2 by FDA under an Emergency Use Authorization (EUA). This EUA will remain  in effect (meaning this test can be used) for the duration of the COVID-19 declaration under Se ction 564(b)(1) of the Act, 21 U.S.C. section 360bbb-3(b)(1), unless the authorization is terminated or revoked sooner.  Performed at [facility], [address], [place], [place] [address]          Radiology Studies: No results found.  Scheduled Meds: . amLODipine  5 mg Oral Daily  . brimonidine  1 drop Right Eye BID  . carvedilol  6.25 mg Oral BID  . Chlorhexidine Gluconate Cloth  6 each Topical Daily  . docusate sodium  100 mg Oral BID  . enoxaparin (LOVENOX) injection  40 mg Subcutaneous Q24H  . insulin aspart  0-15 Units Subcutaneous TID WC  . insulin detemir  12 Units Subcutaneous QHS  . latanoprost  1 drop Right Eye QHS  . melatonin  10 mg  Oral QHS  . polyethylene glycol  17 g Oral q AM  . senna  2 tablet Oral BID  . simvastatin  20 mg Oral Daily  . sodium chloride flush  3 mL Intravenous Q12H  . tamsulosin  0.4 mg Oral QPC supper   Continuous Infusions: . sodium chloride Stopped (01/30/20 1505)     LOS: 4 days    Time spent: 25 minutes    Tresa Moore, MD [facility] Pager [phone]  If 7PM-7AM, please contact night-coverage 02/04/2020, 10:59 AM

## 2020-02-04 NOTE — Progress Notes (Signed)
Physical Therapy Treatment Patient Details Name: Eryn Krejci. MRN: [id number] DOB: 1946/01/22 Today's Date: 02/04/2020    History of Present Illness Lynton Crescenzo" Domine is a 74 y.o. male who presented to the [facility] emergency department on 01/14/20 with concern for worsening weakness in his lower extremities.  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 He DC'd briefly to SNF to await sx, and is now s/p C 3-4 anterior cervical decompression/discectomy. PMH significant for chronic diastolic heart failure, HTN, HLD, DM2, GERD, and left eye blindness.    PT Comments    Fatigued from poor sleep last night but ready to try.  Finished HEP prior to session.  To EOB with mod a x 1 and mod verbal cues for sequencing.  Steady in sitting but supervision provided due to reports of dizziness that clears with time.  Knee braces donned.  Stood x 2 with good effort and mod a x 2 to stand and min a x 2 once up.  50 seconds max but unable to move feet today.  Third attempt unable to stand.  Returned to supine with mod a x 2 and max a x 2 to reposition in bed.     Follow Up Recommendations  CIR     Equipment Recommendations       Recommendations for Other Services       Precautions / Restrictions Precautions Precautions: Fall;Cervical Precaution Booklet Issued: Yes (comment) Required Braces or Orthoses: Other Brace Other Brace: new order for soft cervical brace for pt comfort; bilat knee braces Restrictions Weight Bearing Restrictions: No Other Position/Activity Restrictions: B shoulder pain/weakness limting UE assist.    Mobility  Bed Mobility Overal bed mobility: Needs Assistance Bed Mobility: Supine to Sit;Sit to Supine Rolling: Mod assist Sidelying to sit: Mod assist     Sit to sidelying: Mod assist;+2 for physical assistance General bed mobility comments: Cueing for sequencing log roll technique.  Transfers Overall transfer level:  Needs assistance Equipment used: Rolling walker (2 wheeled) Transfers: Sit to/from Stand Sit to Stand: Mod assist;Min assist;+2 physical assistance;From elevated surface         General transfer comment: x 2 with good stand, third attempt unable  Ambulation/Gait             General Gait Details: unable   Stairs             Wheelchair Mobility    Modified Rankin (Stroke Patients Only)       Balance Overall balance assessment: Needs assistance Sitting-balance support: No upper extremity supported;Feet supported Sitting balance-Leahy Scale: Good     Standing balance support: Bilateral upper extremity supported;During functional activity Standing balance-Leahy Scale: Poor Standing balance comment: heavy lean on RW with +2 hands on at all times for safety.  L knee pain/weakness limits progression                            Cognition Arousal/Alertness: Awake/alert Behavior During Therapy: WFL for tasks assessed/performed;Anxious Overall Cognitive Status: Within Functional Limits for tasks assessed                                 General Comments: A&O x 4, becomes anxious with attempts to stand      Exercises Other Exercises Other Exercises: completed HEP on his own prior to arrival.    General Comments  Pertinent Vitals/Pain Pain Assessment: Faces Faces Pain Scale: Hurts little more Pain Location: L knee Pain Descriptors / Indicators: Crushing;Pressure;Discomfort;Sore;Other (Comment) Pain Intervention(s): Limited activity within patient's tolerance;Monitored during session;Repositioned    Home Living                      Prior Function            PT Goals (current goals can now be found in the care plan section) Progress towards PT goals: Progressing toward goals    Frequency    7X/week      PT Plan Current plan remains appropriate    Co-evaluation              AM-PAC PT "6 Clicks"  Mobility   Outcome Measure  Help needed turning from your back to your side while in a flat bed without using bedrails?: A Little Help needed moving from lying on your back to sitting on the side of a flat bed without using bedrails?: Total Help needed moving to and from a bed to a chair (including a wheelchair)?: A Lot Help needed standing up from a chair using your arms (e.g., wheelchair or bedside chair)?: A Lot Help needed to walk in hospital room?: Total Help needed climbing 3-5 steps with a railing? : Total 6 Click Score: 10    End of Session Equipment Utilized During Treatment: Gait belt Activity Tolerance: Patient tolerated treatment well;Patient limited by pain Patient left: in bed;with call bell/phone within reach;with family/visitor present;with bed alarm set Nurse Communication: Mobility status       Time: 1351-1419 PT Time Calculation (min) (ACUTE ONLY): 28 min  Charges:  $Therapeutic Exercise: 8-22 mins $Therapeutic Activity: 8-22 mins                    Danielle Dess, PTA 02/04/20, 3:04 PM

## 2020-02-05 DIAGNOSIS — Z981 Arthrodesis status: Secondary | ICD-10-CM | POA: Diagnosis not present

## 2020-02-05 LAB — GLUCOSE, CAPILLARY
Glucose-Capillary: 107 mg/dL — ABNORMAL HIGH (ref 70–99)
Glucose-Capillary: 145 mg/dL — ABNORMAL HIGH (ref 70–99)
Glucose-Capillary: 150 mg/dL — ABNORMAL HIGH (ref 70–99)
Glucose-Capillary: 144 mg/dL — ABNORMAL HIGH (ref 70–99)

## 2020-02-05 MED ORDER — TRAZODONE HCL 50 MG PO TABS
50.0000 mg | ORAL_TABLET | Freq: Every evening | ORAL | Status: DC | PRN
  Administered 2020-02-05 – 2020-02-13 (×6): 50 mg via ORAL
  Filled 2020-02-05 (×6): qty 1

## 2020-02-05 NOTE — TOC Progression Note (Signed)
Transition of Care (TOC) - Progression Note    Patient Details  Name: Roy [name]. MRN: [id number] Date of Birth: [birth year]  Transition of [facility]) CM/SW Contact  Trenton Founds, RN Phone Number: 02/05/2020, 1:11 PM  Clinical Narrative:   RNCM received voicemail from [facility] with [facility], requesting updated clinicals be faxed. RNCM sent updated PT and OT notes to [phone].     Expected Discharge Plan: IP Rehab Facility Barriers to Discharge: Continued Medical Work up  Expected Discharge Plan and Services Expected Discharge Plan: IP Rehab Facility     Post Acute Care Choice: IP Rehab Living arrangements for the past 2 months: Single Family Home                                       Social Determinants of Health (SDOH) Interventions    Readmission Risk Interventions No flowsheet data found.

## 2020-02-05 NOTE — Progress Notes (Signed)
PROGRESS NOTE    Roy [name].  KGY:[id number] DOB: October 20, 1945 DOA: 01/29/2020 PCP: Rigoberto Noel, MD  Brief Narrative:  Consulted by: Dr. Adriana Simas of neurosurgery and Autumn Messing (neurosurgery NP);  Reason for Consult: assistance with post-op management of several chronic medical problems, including type 2 diabetes mellitus, hypertension, and chronic diastolic heart failure.   Patient seen and examined this morning.  Wife at bedside.  Blood pressure reasonably well controlled over interval.  Occasional spikes not necessitating change in antihypertensive regimen.  Blood sugars have also been better controlled after addition of Tradjenta.  [facility] hospitalist service to sign off patient today. Thank you for the consult.  Please reach out for further questions or concerns.   Assessment & Plan:   Principal Problem:   S/P cervical spinal fusion Active Problems:   Hypertension   Diabetes mellitus without complication (HCC)   HLD (hyperlipidemia)   Chronic diastolic CHF (congestive heart failure) (HCC)   Post-operative state  Chronic diastolic heart failure Most recent echocardiogram was performed on 01/15/2020 showed evidence of mild LVH, EF 60 to 65%, no evidence of focal wall motion abnormality, and showed grade 1 diastolic dysfunction.   Not on any diuretics as an outpatient.   However Plan: Monitor ins and outs and weights   Type 2 diabetes mellitus  Most recent hemoglobin A1c 5.8% on 01/16/2020.   On Metformin as an outpatient as well as the following basal and short-acting insulin regimen: Levemir 12 units subcu nightly as well as sliding scale NovoLog 3 times daily with meals, typically resulting in 4-8 units of NovoLog per meal.  -Wife is a retired Scientist, clinical (histocompatibility and immunogenetics) so keeps close monitoring of his blood pressure and blood sugar as outpatient -Blood sugars have been slowly climbing likely owing to patient's improved p.o. intake Plan: Continue sliding scale, moderate  intensity Levemir 12 units nightly.  This is home dose Continue Tradjenta 5mg  PO qd Hold Metformin while admitted Carb modified diet Goal blood sugar while hospitalized 140-180 Can consider addition of Tradjenta while admitted Patient well controlled on above regimen.  He can continue Tradjenta while in-house.  On admission suggest returning back to home regimen of Metformin and Lantus.  Hyperlipidemia  On simvastatin at home.  Plan: Continue home statin.  Essential hypertension  Amlodipine and Coreg outpatient regimen Per outpatient cardiology some consideration for lisinopril addition Blood pressures have been mildly elevated postoperatively Plan: Continue amlodipine 5 mg daily Continue carvedilol 6.25 mg twice daily As needed hydralazine Monitor blood pressure per unit protocol Recommend resuming home regimen at time of discharge.  He can see outpatient providers for consideration about starting another agent or dose adjustment of current regimen.   DVT prophylaxis: SCDs Code Status: Full Family Communication: Wife at bedside 10/25 Disposition Plan: Per primary neurosurgical team.  Hospitalist on consult  Hospitalist service signing off today 02/05/2020.  Thank you for the consult.  Please feel free to reach out for further questions or concerns.   Consultants:   [facility] hospitalist  Procedures:   Anterior cervical decompression discectomy, 01/29/2020  Antimicrobials:   None   Subjective: Seen and examined.  Wife bedside.  Patient endorsing poor sleep and back pain.  Objective: Vitals:   02/04/20 0745 02/04/20 1549 02/04/20 2343 02/05/20 0738  BP: 119/78 (!) 158/94 137/82 122/77  Pulse: (!) 58 (!) 58 61 62  Resp: 17 15 20 16   Temp: (!) 97.5 F (36.4 C) (!) 97.4 F (36.3 C) 97.7 F (36.5 C) 98.4 F (36.9 C)  TempSrc: Oral Oral Oral   SpO2: 96% 100% 96% 96%  Weight:      Height:        Intake/Output Summary (Last 24 hours) at 02/05/2020 1402 Last  data filed at 02/05/2020 1046 Gross per 24 hour  Intake 600 ml  Output 800 ml  Net -200 ml   Filed Weights   01/29/20 1325  Weight: 115.5 kg    Examination:  General exam: No acute distress Respiratory system: Clear to auscultation. Respiratory effort normal. Cardiovascular system: S1 & S2 heard, RRR. No JVD, murmurs, rubs, gallops or clicks. No pedal edema. Gastrointestinal system: Obese, nontender, nondistended, normal bowel sounds Central nervous system: Alert and oriented. No focal neurological deficits. Extremities: Symmetric 5 x 5 power. Skin: No rashes, lesions or ulcers Psychiatry: Judgement and insight appear normal. Mood & affect appropriate.     Data Reviewed: I have personally reviewed following labs and imaging studies  CBC: No results for input(s): WBC, NEUTROABS, HGB, HCT, MCV, PLT in the last 168 hours. Basic Metabolic Panel: No results for input(s): NA, K, CL, CO2, GLUCOSE, BUN, CREATININE, CALCIUM, MG, PHOS in the last 168 hours. GFR: Estimated Creatinine Clearance: 97.9 mL/min (by C-G formula based on SCr of 0.92 mg/dL). Liver Function Tests: No results for input(s): AST, ALT, ALKPHOS, BILITOT, PROT, ALBUMIN in the last 168 hours. No results for input(s): LIPASE, AMYLASE in the last 168 hours. No results for input(s): AMMONIA in the last 168 hours. Coagulation Profile: No results for input(s): INR, PROTIME in the last 168 hours. Cardiac Enzymes: No results for input(s): CKTOTAL, CKMB, CKMBINDEX, TROPONINI in the last 168 hours. BNP (last 3 results) No results for input(s): PROBNP in the last 8760 hours. HbA1C: No results for input(s): HGBA1C in the last 72 hours. CBG: Recent Labs  Lab 02/04/20 1127 02/04/20 1644 02/04/20 2106 02/05/20 0738 02/05/20 1132  GLUCAP 153* 143* 165* 107* 145*   Lipid Profile: No results for input(s): CHOL, HDL, LDLCALC, TRIG, CHOLHDL, LDLDIRECT in the last 72 hours. Thyroid Function Tests: No results for input(s):  TSH, T4TOTAL, FREET4, T3FREE, THYROIDAB in the last 72 hours. Anemia Panel: No results for input(s): VITAMINB12, FOLATE, FERRITIN, TIBC, IRON, RETICCTPCT in the last 72 hours. Sepsis Labs: No results for input(s): PROCALCITON, LATICACIDVEN in the last 168 hours.  Recent Results (from the past 240 hour(s))  SARS CORONAVIRUS 2 (TAT 6-24 HRS) Nasopharyngeal Nasopharyngeal Swab     Status: None   Collection Time: 01/28/20  2:52 PM   Specimen: Nasopharyngeal Swab  Result Value Ref Range Status   SARS Coronavirus 2 NEGATIVE NEGATIVE Final    Comment: (NOTE) SARS-CoV-2 target nucleic acids are NOT DETECTED.  The SARS-CoV-2 RNA is generally detectable in upper and lower respiratory specimens during the acute phase of infection. Negative results do not preclude SARS-CoV-2 infection, do not rule out co-infections with other pathogens, and should not be used as the sole basis for treatment or other patient management decisions. Negative results must be combined with clinical observations, patient history, and epidemiological information. The expected result is Negative.  Fact Sheet for Patients: HairSlick.no  Fact Sheet for Healthcare Providers: quierodirigir.com  This test is not yet approved or cleared by the [place] FDA and  has been authorized for detection and/or diagnosis of SARS-CoV-2 by FDA under an Emergency Use Authorization (EUA). This EUA will remain  in effect (meaning this test can be used) for the duration of the COVID-19 declaration under Se ction 564(b)(1) of the Act, 21 U.S.C.  section 360bbb-3(b)(1), unless the authorization is terminated or revoked sooner.  Performed at [facility], [address], [place], [place] [address]          Radiology Studies: No results found.      Scheduled Meds: . amLODipine  5 mg Oral Daily  . brimonidine  1 drop Right Eye BID  . carvedilol  6.25 mg Oral BID   . Chlorhexidine Gluconate Cloth  6 each Topical Daily  . docusate sodium  100 mg Oral BID  . enoxaparin (LOVENOX) injection  40 mg Subcutaneous Q24H  . insulin aspart  0-15 Units Subcutaneous TID WC  . insulin detemir  12 Units Subcutaneous QHS  . latanoprost  1 drop Right Eye QHS  . linagliptin  5 mg Oral Daily  . melatonin  10 mg Oral QHS  . polyethylene glycol  17 g Oral q AM  . senna  2 tablet Oral BID  . simvastatin  20 mg Oral Daily  . sodium chloride flush  3 mL Intravenous Q12H  . tamsulosin  0.4 mg Oral QPC supper   Continuous Infusions: . sodium chloride Stopped (01/30/20 1505)     LOS: 5 days    Time spent: 25 minutes    Tresa Moore, MD [facility] Pager [phone]  If 7PM-7AM, please contact night-coverage 02/05/2020, 2:02 PM

## 2020-02-05 NOTE — Progress Notes (Signed)
Physical Therapy Treatment Patient Details Name: Roy [name]. MRN: [id number] DOB: 1945-09-26 Today's Date: 02/05/2020    History of Present Illness Roy [name] is a 74 y.o. male who presented to the [facility] emergency department on 01/14/20 with concern for worsening weakness in his lower extremities.  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 He DC'd briefly to SNF to await sx, and is now s/p C 3-4 anterior cervical decompression/discectomy. PMH significant for chronic diastolic heart failure, HTN, HLD, DM2, GERD, and left eye blindness.    PT Comments    Pt was long sitting in bed with BLE knee braces on. He agrees to PT session requesting to get on/off BSC for BM. Required +2 assist to safely stand and get to [facility]. Pt very anxious with standing and required constant protecting of LLE to prevent buckling. Pt has poor ability to advance LEs in standing even with +2 assistance. Fatigues quickly and required extended rest. Did perform several exercises EOB and in bed. Discussed importance of continued performance to improve strength. Will need continued skilled PT at [facility] to address deficits and improve safe functional mobility. Pt did have successful BM during session. Pt was repositioned in bed at conclusion of session with call bell in reach, bed alarm in place and RN aware of pt's abilities.     Follow Up Recommendations  CIR;SNF     Equipment Recommendations  None recommended by PT    Recommendations for Other Services       Precautions / Restrictions Precautions Precautions: Fall Precaution Booklet Issued: Yes (comment) Required Braces or Orthoses: Other Brace (BLEs hindged knee) Other Brace: new order for soft cervical brace for pt comfort; bilat knee braces Restrictions Weight Bearing Restrictions: No    Mobility  Bed Mobility Overal bed mobility: Needs Assistance Bed Mobility: Rolling;Sidelying to Sit;Supine to  Sit;Sit to Sidelying;Sit to Supine Rolling: Mod assist Sidelying to sit: Max assist Supine to sit: Mod assist;Max assist Sit to supine: Max assist Sit to sidelying: Max assist General bed mobility comments: +2 for safety however only one required. max assist to achieve EOB sitting and returnt o supine after OOB activity  Transfers Overall transfer level: Needs assistance Equipment used: Rolling walker (2 wheeled) Transfers: Sit to/from [employer] Sit to Stand: +2 safety/equipment;+2 physical assistance;From elevated surface;Min assist;Mod assist Stand pivot transfers: Max assist;Total assist;+2 safety/equipment;+2 physical assistance;From elevated surface       General transfer comment: pt performed STS 3 x EOB and 1 x from elevated BSC. total assist required(=2 assist) for safety  Ambulation/Gait Ambulation/Gait assistance: Max assist;+2 physical assistance Gait Distance (Feet): 2 Feet Assistive device: Rolling walker (2 wheeled) Gait Pattern/deviations: Step-to pattern Gait velocity: decreased   General Gait Details: pt was only able to take side steps from FOB to [facility]. required constant assist with LLE toprevent knee buckling and LLE advancement        Balance Overall balance assessment: Needs assistance Sitting-balance support: No upper extremity supported;Feet supported Sitting balance-Leahy Scale: Good     Standing balance support: Bilateral upper extremity supported;During functional activity Standing balance-Leahy Scale: Poor Standing balance comment: heavy lean on RW with +2 hands on at all times for safety.  L knee pain/weakness limits progression       Cognition Arousal/Alertness: Awake/alert Behavior During Therapy: WFL for tasks assessed/performed;Anxious Overall Cognitive Status: Within Functional Limits for tasks assessed      General Comments: A&O x 4, becomes anxious with  attempts to stand             Pertinent Vitals/Pain Pain  Assessment: No/denies pain Pain Score: 0-No pain Faces Pain Scale: No hurt Pain Location: L knee (in wrt bearing) Pain Intervention(s): Limited activity within patient's tolerance;Monitored during session;Repositioned           PT Goals (current goals can now be found in the care plan section) Acute Rehab PT Goals Patient Stated Goal: to get my strength back Progress towards PT goals: Progressing toward goals    Frequency    7X/week      PT Plan Current plan remains appropriate    Co-evaluation     PT goals addressed during session: Mobility/safety with mobility;Balance;Proper use of DME;Strengthening/ROM        AM-PAC PT "6 Clicks" Mobility   Outcome Measure  Help needed turning from your back to your side while in a flat bed without using bedrails?: A Little Help needed moving from lying on your back to sitting on the side of a flat bed without using bedrails?: A Lot Help needed moving to and from a bed to a chair (including a wheelchair)?: A Lot Help needed standing up from a chair using your arms (e.g., wheelchair or bedside chair)?: A Lot Help needed to walk in hospital room?: Total Help needed climbing 3-5 steps with a railing? : Total 6 Click Score: 11    End of Session Equipment Utilized During Treatment: Gait belt Activity Tolerance: Patient tolerated treatment well;Patient limited by pain Patient left: in bed;with call bell/phone within reach;with family/visitor present;with bed alarm set Nurse Communication: Mobility status PT Visit Diagnosis: Unsteadiness on feet (R26.81);Other abnormalities of gait and mobility (R26.89);Repeated falls (R29.6);Muscle weakness (generalized) (M62.81);History of falling (Z91.81);Difficulty in walking, not elsewhere classified (R26.2);Other symptoms and signs involving the nervous system (R29.898)     Time: 2703-5009 PT Time Calculation (min) (ACUTE ONLY): 32 min  Charges:  $Therapeutic Exercise: 8-22 mins $Therapeutic  Activity: 8-22 mins                     Jetta Lout PTA 02/05/20, 11:26 AM

## 2020-02-05 NOTE — Plan of Care (Signed)

## 2020-02-06 LAB — GLUCOSE, CAPILLARY
Glucose-Capillary: 95 mg/dL (ref 70–99)
Glucose-Capillary: 145 mg/dL — ABNORMAL HIGH (ref 70–99)
Glucose-Capillary: 206 mg/dL — ABNORMAL HIGH (ref 70–99)
Glucose-Capillary: 152 mg/dL — ABNORMAL HIGH (ref 70–99)

## 2020-02-06 NOTE — Progress Notes (Signed)
Inpatient Rehabilitation Admissions Coordinator  Inpatient rehab consult received. I spoke with pt's wife by phone who was at patient's bedside. We discussed goals and expectations of  possible Cir admit. They had wanted AIR at [facility], but has not received a bed offer offer. I will begin insurance authorization with [facility] for a possible admit. I discussed with Misty with TOC.  Ottie Glazier, RN, MSN Rehab Admissions Coordinator [phone] 02/06/2020 2:49 PM

## 2020-02-06 NOTE — TOC Progression Note (Signed)
Transition of Care (TOC) - Progression Note    Patient Details  Name: Roy [name]. MRN: [id number] Date of Birth: 1945/07/07  Transition of Care [facility]) CM/SW Contact  Shelbie Ammons, RN Phone Number: 02/06/2020, 9:21 AM  Clinical Narrative:   RNCM met with patient in room to discuss lack of available beds at [facility] and per report there may be no beds available this week. After some discussion wife reports that she would like to look at [facility] as a second option. Secure chat sent to determine if this is still an option.     Expected Discharge Plan: IP Rehab Facility Barriers to Discharge: Continued Medical Work up  Expected Discharge Plan and Services Expected Discharge Plan: [place]     Post Acute Care Choice: IP Rehab Living arrangements for the past 2 months: Single Family Home                                       Social Determinants of Health (SDOH) Interventions    Readmission Risk Interventions No flowsheet data found.

## 2020-02-06 NOTE — Progress Notes (Signed)
Procedure: C3-4 ACDF Procedure date: 01/29/2020 Diagnosis: cervical radiculopathy    History: Roy [name]. is s/p C3-4 ACDF POD8: Roy [name] continues to do well postoperatively, waiting on placement. No complaints this morning.  POD7: Doing well. No complaints.  POD6: Roy [name] was seen this morning, sleepy but arousable. He denies pain, his only complaint is that he didn't sleep well last night.  He is still pending disposition.   POD4: Roy [name] is doing well s/p ACDF.  He does feel like his hand strength has improved and has less pain.  He continues to have numbness.  He did stand with physical therapy yesterday but had some difficulty with pain in his left knee.  He does feel he is improving with them.  He has no difficulty swallowing.  He is not complaining of any significant neck pain.   Physical Exam: Vitals:   02/05/20 2329 02/06/20 0729  BP: (!) 141/98 139/84  Pulse: 72 76  Resp: 15 17  Temp: 97.7 F (36.5 C) 98.5 F (36.9 C)  SpO2: 95% 100%    General: drowsy, lying in bed Strength:5/5 throughout, effort dependent Sensation: Intact in bilateral lower extremities, decreased in bilateral hands Skin: Incision C/D/I, neck is soft   Assessment/Plan:  Roy [name]. is POD6 s/p ACDF C3-4.  He is doing well and is safe for discharge from a medical and surgical standpoint. We are awaiting disposition planning from case management.    #ACDF C3-4 - mobilize - pain control - PTOT, will need rehab placement - Imaging reviewed, good alignment, no complications  #DVT Prophylaxis - DVT prophylaxis Lovenox 40mg  subq qd - SCDs - Ambulation  ##DM2, CHF, CAD - Continue home medications, appreciate medical team assistance with management of comorbid conditions - Carb modified diet  Disposition per case management, hopefully sometime this week.   , NP Neurosurgery

## 2020-02-06 NOTE — Progress Notes (Signed)
Physical Therapy Treatment Patient Details Name: Roy [name]. MRN: [id number] DOB: Sep 02, 1945 Today's Date: 02/06/2020    History of Present Illness Roy [name] is a 74 y.o. male who presented to the [facility] emergency department on 01/14/20 with concern for worsening weakness in his lower extremities.  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 He DC'd briefly to SNF to await sx, and is now s/p C 3-4 anterior cervical decompression/discectomy. PMH significant for chronic diastolic heart failure, HTN, HLD, DM2, GERD, and left eye blindness.    PT Comments    Pt was long sitting in bed upon arriving with wife at bedside. He agrees to PT session and is very motivated to improve. Pt has BLE knee braces donned throughout session and left on while pt was sitting in recliner. He required max assist of one to achieve EOB short sit. Performed several LE there ex while sitting EOB. Pt tends to have BLE(moreso LLE) knee buckling in standing. Requires +2 assist to safely stand 3 x EOB. Required max vcs/tcs for safety but was able to clear R/L LE form floor with assisted wt shift and protecting knees from buckling. Tolerated standing 1-2 minutes each trial. Pt does have some anxiety with standing but overall tolerated well. Required +2 max(total) to stand pivot to recliner. Discussed with pt/RN staff that therapy will return top assist pt with returning to bed. Pt continues to be very motivated. Recommend extensive PT at DC to continue to progress pt towards PLOF. Would benefit from CIR/SNF prior to returning home. At conclusion of session, pt is in recliner with chair alarm in place and call bell in reach.     Follow Up Recommendations  CIR;SNF     Equipment Recommendations  None recommended by PT    Recommendations for Other Services       Precautions / Restrictions Precautions Precautions: Fall Precaution Booklet Issued: No Precaution  Comments: knees buckle Required Braces or Orthoses: Other Brace (BLE hindged knee braces) Other Brace: new order for soft cervical brace for pt comfort; bilat knee braces Restrictions Weight Bearing Restrictions: No    Mobility  Bed Mobility Overal bed mobility: Needs Assistance Bed Mobility: Sidelying to Sit;Rolling;Supine to Sit;Sit to Supine Rolling: Mod assist Sidelying to sit: Max assist Supine to sit: Max assist     General bed mobility comments: Increased time to perform. Roll L to short sit. increased time and vcs for sequencing and safety  Transfers Overall transfer level: Needs assistance Equipment used: Rolling walker (2 wheeled) Transfers: Sit to/from [employer] Sit to Stand: +2 safety/equipment;+2 physical assistance;From elevated surface;Min assist;Mod assist Stand pivot transfers: +2 physical assistance;+2 safety/equipment;From elevated surface;Max assist       General transfer comment: pt performed STS 3 x EOB prior to stand pivot to recliner. requires +2 assist to safely stand to RW. +2 max assist to stand pivot without use of RW for safety.  Ambulation/Gait Ambulation/Gait assistance: Max assist;+2 physical assistance       Gait velocity: decreased   General Gait Details: pt was able to clear R/L LE in static standing however requires max assist + vcs while therapist protetcs knees from buckling.       Balance Overall balance assessment: Needs assistance Sitting-balance support: No upper extremity supported;Feet supported Sitting balance-Leahy Scale: Good Sitting balance - Comments: Steady static sitting, reaching within BOS. Pt performs BUE exercises while seated EOB, no LOB appreciated.   Standing balance support: Bilateral  upper extremity supported;During functional activity Standing balance-Leahy Scale: Poor Standing balance comment: heavy lean on RW with +2 hands on at all times for safety.  L knee pain/weakness limits  progression       Cognition Arousal/Alertness: Awake/alert Behavior During Therapy: WFL for tasks assessed/performed;Anxious Overall Cognitive Status: Within Functional Limits for tasks assessed        General Comments: A&O x 4, becomes anxious with attempts to stand      Exercises General Exercises - Lower Extremity Long Arc Quad: AROM;Both;10 reps        Pertinent Vitals/Pain Pain Assessment: No/denies pain Pain Score: 0-No pain Faces Pain Scale: No hurt Pain Location: L knee Pain Descriptors / Indicators: Crushing;Pressure;Discomfort;Sore;Other (Comment) Pain Intervention(s): Limited activity within patient's tolerance;Monitored during session;Premedicated before session           PT Goals (current goals can now be found in the care plan section) Acute Rehab PT Goals Patient Stated Goal: to get my strength back Progress towards PT goals: Progressing toward goals    Frequency    7X/week      PT Plan Current plan remains appropriate    Co-evaluation     PT goals addressed during session: Mobility/safety with mobility;Balance;Proper use of DME;Strengthening/ROM        AM-PAC PT "6 Clicks" Mobility   Outcome Measure  Help needed turning from your back to your side while in a flat bed without using bedrails?: A Little Help needed moving from lying on your back to sitting on the side of a flat bed without using bedrails?: A Lot Help needed moving to and from a bed to a chair (including a wheelchair)?: A Lot Help needed standing up from a chair using your arms (e.g., wheelchair or bedside chair)?: A Lot Help needed to walk in hospital room?: Total Help needed climbing 3-5 steps with a railing? : Total 6 Click Score: 11    End of Session Equipment Utilized During Treatment: Gait belt Activity Tolerance: Patient tolerated treatment well;Patient limited by pain Patient left: in chair;with call bell/phone within reach;with chair alarm set;with family/visitor  present Nurse Communication: Mobility status PT Visit Diagnosis: Unsteadiness on feet (R26.81);Other abnormalities of gait and mobility (R26.89);Repeated falls (R29.6);Muscle weakness (generalized) (M62.81);History of falling (Z91.81);Difficulty in walking, not elsewhere classified (R26.2);Other symptoms and signs involving the nervous system (R29.898)     Time: 9935-7017 PT Time Calculation (min) (ACUTE ONLY): 30 min  Charges:  $Therapeutic Exercise: 8-22 mins $Therapeutic Activity: 8-22 mins                     Jetta Lout PTA 02/06/20, 11:04 AM

## 2020-02-07 LAB — GLUCOSE, CAPILLARY
Glucose-Capillary: 104 mg/dL — ABNORMAL HIGH (ref 70–99)
Glucose-Capillary: 166 mg/dL — ABNORMAL HIGH (ref 70–99)
Glucose-Capillary: 187 mg/dL — ABNORMAL HIGH (ref 70–99)
Glucose-Capillary: 176 mg/dL — ABNORMAL HIGH (ref 70–99)

## 2020-02-07 NOTE — Progress Notes (Signed)
Per Neuro PA, catheter will remain until Urologist follow up. Will continue with maintenance care per protocol.

## 2020-02-07 NOTE — Progress Notes (Signed)
Procedure: C3-4 ACDF Procedure date: 01/29/2020 Diagnosis: cervical radiculopathy    History: Roy [name]. is s/p C3-4 ACDF POD9: [name] continues to await rehab placement but otherwise has no complaints. He continues to work with PT/OT. He is currently recommended for acute rehab and this is in process.  POD8: [name] continues to do well postoperatively, waiting on placement. No complaints this morning.  POD7: Doing well. No complaints.  POD6: [name] was seen this morning, sleepy but arousable. He denies pain, his only complaint is that he didn't sleep well last night.  He is still pending disposition.   POD4: [name] is doing well s/p ACDF.  He does feel like his hand strength has improved and has less pain.  He continues to have numbness.  He did stand with physical therapy yesterday but had some difficulty with pain in his left knee.  He does feel he is improving with them.  He has no difficulty swallowing.  He is not complaining of any significant neck pain.   Physical Exam: Vitals:   02/06/20 2332 02/07/20 0815  BP: 128/71 134/83  Pulse: 77 64  Resp: 18 15  Temp: 98 F (36.7 C) 98.1 F (36.7 C)  SpO2: 95% 99%    General: drowsy, lying in bed Strength:5/5 throughout, effort dependent Sensation: Intact in bilateral lower extremities, decreased in bilateral hands Skin: Incision C/D/I, neck is soft   Assessment/Plan:  Roy [name]. is POD9 s/p ACDF C3-4.  He is doing well and is safe for discharge from a medical and surgical standpoint. We are awaiting disposition planning from case management.    #ACDF C3-4 - mobilize - pain control - PTOT, will need rehab placement - Imaging reviewed, good alignment, no complications  #DVT Prophylaxis - DVT prophylaxis Lovenox 40mg  subq qd - SCDs - Ambulation  ##DM2, CHF, CAD - Continue home medications  - Carb modified diet  Disposition per case management, hopefully sometime this week.    , NP Neurosurgery

## 2020-02-07 NOTE — Progress Notes (Signed)
Per patient request does not want miralax

## 2020-02-07 NOTE — Care Management Important Message (Signed)
Important Message  Patient Details  Name: Roy [name]. MRN: [id number] Date of Birth: [birth year]   Medicare Important Message Given:  Yes     Olegario Messier A Britton Bera 02/07/2020, 11:02 AM

## 2020-02-07 NOTE — Progress Notes (Signed)
Physical Therapy Treatment Patient Details Name: Roy [name]. MRN: [id number] DOB: 23-Aug-1945 Today's Date: 02/07/2020    History of Present Illness Garrit Marrow" Kadrmas is a 74 y.o. male who presented to the [facility] emergency department on 01/14/20 with concern for worsening weakness in his lower extremities.  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 He DC'd briefly to SNF to await sx, and is now s/p C 3-4 anterior cervical decompression/discectomy. PMH significant for chronic diastolic heart failure, HTN, HLD, DM2, GERD, and left eye blindness.    PT Comments    Pt was supine in bed with spouse at bedside upon arriving. He reports poor sleep previous night and that his legs were making involuntary movements. Pt has anxiety throughout session. Major fear of falling. Has BLE knee braces donned throughout. He required max assist of one to achieve EOB short sit. Stood ~ 4 x EOB in [employer] for safety with 2nd person for additional safety. Pt has severe knee buckling(more so on LLE than RLE). Unsafe to progress away from EOB at this time due to weakness. Per pt/spouse, has been performing there ex in bed as prescribed. Pt will need extensive PT going forward to assist him to PLOF. Recommend CIR to address these deficits and improve Independence. Acute PT will continue to follow per POC progressing as able per pt tolerance. Pt did sit in recliner from 1130 to 1330 and used lift to return to bed. RN aware of pt's abilities.     Follow Up Recommendations  CIR;SNF     Equipment Recommendations  None recommended by PT    Recommendations for Other Services       Precautions / Restrictions Precautions Precautions: Fall Precaution Booklet Issued: No Precaution Comments: knees buckle Required Braces or Orthoses: Other Brace Other Brace: new order for soft cervical brace for pt comfort; bilat knee braces Restrictions Weight Bearing  Restrictions: No    Mobility  Bed Mobility Overal bed mobility: Needs Assistance Bed Mobility: Sidelying to Sit;Rolling;Supine to Sit;Sit to Supine Rolling: Max assist Sidelying to sit: Max assist;+2 for safety/equipment Supine to sit: Max assist;+2 for safety/equipment Sit to supine: Max assist Sit to sidelying: Max assist;+2 for safety/equipment;+2 for physical assistance General bed mobility comments: Pt was able to perform log roll R to short sit. max assist to roll with +2 asssist tosafely achieve EOB short sit. Pt present with increased overall weakness today. He is more anxious with all mobility and transfers.  Transfers Overall transfer level: Needs assistance Equipment used: Rolling walker (2 wheeled) Transfers: Sit to/from Stand Sit to Stand: +2 safety/equipment;+2 physical assistance;From elevated surface         General transfer comment: Pt performed STS with +2 assist for safety. Used Rozell Searing for safety. stood 4 x in sabina x 2 minutes. pt fatigues quickly. Did use lift to safely progress pt to recliner from EOB. returned later to assist pt back to bed.  Ambulation/Gait         Gait velocity: decreased   General Gait Details: unsafe to trial. continues to have knee buckling present       Balance Overall balance assessment: Needs assistance Sitting-balance support: Feet supported;Bilateral upper extremity supported Sitting balance-Leahy Scale: Poor Sitting balance - Comments: pt required constant assistance to maintain balance while seated EOB. much more assistance required today versus previously observed   Standing balance support: Bilateral upper extremity supported;During functional activity Standing balance-Leahy Scale: Poor  Cognition Arousal/Alertness: Awake/alert Behavior During Therapy: Anxious;WFL for tasks assessed/performed Overall Cognitive Status: Within Functional Limits for tasks assessed        General Comments: A&O x 4,  becomes anxious with attempts to stand             Pertinent Vitals/Pain Pain Assessment: 0-10 Pain Score: 6  Faces Pain Scale: Hurts little more Pain Location: L knee Pain Descriptors / Indicators: Crushing;Pressure;Discomfort;Sore;Other (Comment) Pain Intervention(s): Limited activity within patient's tolerance;Monitored during session;Premedicated before session;Repositioned           PT Goals (current goals can now be found in the care plan section) Acute Rehab PT Goals Patient Stated Goal: to get my strength back Progress towards PT goals: Progressing toward goals    Frequency    7X/week      PT Plan Current plan remains appropriate    Co-evaluation     PT goals addressed during session: Mobility/safety with mobility;Proper use of DME;Strengthening/ROM;Balance        AM-PAC PT "6 Clicks" Mobility   Outcome Measure  Help needed turning from your back to your side while in a flat bed without using bedrails?: A Little Help needed moving from lying on your back to sitting on the side of a flat bed without using bedrails?: A Lot Help needed moving to and from a bed to a chair (including a wheelchair)?: A Lot Help needed standing up from a chair using your arms (e.g., wheelchair or bedside chair)?: A Lot Help needed to walk in hospital room?: Total Help needed climbing 3-5 steps with a railing? : Total 6 Click Score: 11    End of Session Equipment Utilized During Treatment: Gait belt Activity Tolerance: Patient limited by fatigue;Treatment limited secondary to medical complications (Comment) Patient left: in chair;with call bell/phone within reach;with chair alarm set;with family/visitor present Nurse Communication: Mobility status PT Visit Diagnosis: Unsteadiness on feet (R26.81);Other abnormalities of gait and mobility (R26.89);Repeated falls (R29.6);Muscle weakness (generalized) (M62.81);History of falling (Z91.81);Difficulty in walking, not elsewhere  classified (R26.2);Other symptoms and signs involving the nervous system (R29.898)     Time: 1100-1143 PT Time Calculation (min) (ACUTE ONLY): 43 min  Charges:  $Therapeutic Activity: 38-52 mins                     Jetta Lout PTA 02/07/20, 3:18 PM

## 2020-02-08 ENCOUNTER — Ambulatory Visit: Payer: Self-pay | Admitting: Urology

## 2020-02-08 LAB — GLUCOSE, CAPILLARY
Glucose-Capillary: 133 mg/dL — ABNORMAL HIGH (ref 70–99)
Glucose-Capillary: 154 mg/dL — ABNORMAL HIGH (ref 70–99)
Glucose-Capillary: 123 mg/dL — ABNORMAL HIGH (ref 70–99)
Glucose-Capillary: 189 mg/dL — ABNORMAL HIGH (ref 70–99)

## 2020-02-08 NOTE — Progress Notes (Signed)
Inpatient Rehabilitation Admissions Coordinator  I await insurance determination for a possible Cir admit at [facility] [place].  Ottie Glazier, RN, MSN Rehab Admissions Coordinator [phone] 02/08/2020 11:45 AM

## 2020-02-08 NOTE — Progress Notes (Signed)
Received report from Patrice, RN. Assuming care of patient at this time. 

## 2020-02-08 NOTE — NC FL2 (Signed)
[place] MEDICAID FL2 LEVEL OF CARE SCREENING TOOL     IDENTIFICATION  Patient Name: Roy [name]. Birthdate: 1946/01/08 Sex: male Admission Date (Current Location): 01/29/2020  [facility] and [place] Number:      Facility and Address:         Provider Number:    Attending Physician Name and Address:  Karenann Cai, MD  Relative Name and Phone Number:       Current Level of Care:   Recommended Level of Care:   Prior Approval Number:    Date Approved/Denied:   PASRR Number:    Discharge Plan:      Current Diagnoses: Patient Active Problem List   Diagnosis Date Noted  . Post-operative state 01/31/2020  . Hypertension   . Diabetes mellitus without complication (HCC)   . HLD (hyperlipidemia)   . Chronic diastolic CHF (congestive heart failure) (HCC)   . S/P cervical spinal fusion 01/29/2020  . Weakness   . Spinal stenosis of lumbar region   . History of progressive weakness   . Claudication (HCC)   . Lower extremity weakness 01/14/2020    Orientation RESPIRATION BLADDER Height & Weight          Incontinent Weight: 115.5 kg Height:  6\' 4"  (193 cm)  BEHAVIORAL SYMPTOMS/MOOD NEUROLOGICAL BOWEL NUTRITION STATUS           AMBULATORY STATUS COMMUNICATION OF NEEDS Skin   Extensive Assist                           Personal Care Assistance Level of Assistance    Bathing Assistance: Maximum assistance   Dressing Assistance: Maximum assistance     Functional Limitations Info             SPECIAL CARE FACTORS FREQUENCY                       Contractures Contractures Info: Not present    Additional Factors Info  Code Status Code Status Info: Full             Current Medications (02/08/2020):  This is the current hospital active medication list Current Facility-Administered Medications  Medication Dose Route Frequency Provider Last Rate Last Admin  . 0.9 %  sodium chloride infusion  50 mL/hr Intravenous Continuous  02/10/2020, NP   Stopped at 01/30/20 1505  . alum & mag hydroxide-simeth (MAALOX/MYLANTA) 200-200-20 MG/5ML suspension 30 mL  30 mL Oral Q6H PRN Zdeb, Christine, NP      . amLODipine (NORVASC) tablet 5 mg  5 mg Oral Daily Zdeb, Christine, NP   5 mg at 02/08/20 1019  . bisacodyl (DULCOLAX) EC tablet 5 mg  5 mg Oral Daily PRN Zdeb, Christine, NP      . brimonidine (ALPHAGAN) 0.2 % ophthalmic solution 1 drop  1 drop Right Eye BID Zdeb, Christine, NP   1 drop at 02/08/20 1020  . carvedilol (COREG) tablet 6.25 mg  6.25 mg Oral BID Zdeb, Christine, NP   6.25 mg at 02/08/20 1019  . Chlorhexidine Gluconate Cloth 2 % PADS 6 each  6 each Topical Daily 02/10/20, MD   6 each at 02/08/20 1026  . docusate sodium (COLACE) capsule 100 mg  100 mg Oral BID Zdeb, Christine, NP   100 mg at 02/07/20 2252  . enoxaparin (LOVENOX) injection 40 mg  40 mg Subcutaneous Q24H Zdeb, Christine, NP   40 mg at 02/07/20 2253  .  famotidine (PEPCID) tablet 20 mg  20 mg Oral QHS PRN Zdeb, Christine, NP      . hydrALAZINE (APRESOLINE) injection 10 mg  10 mg Intravenous Q4H PRN Sreenath, Sudheer B, MD      . HYDROmorphone (DILAUDID) injection 0.25 mg  0.25 mg Intravenous Q4H PRN Zdeb, Christine, NP      . insulin aspart (novoLOG) injection 0-15 Units  0-15 Units Subcutaneous TID WC Zdeb, Christine, NP   3 Units at 02/08/20 1205  . insulin detemir (LEVEMIR) injection 12 Units  12 Units Subcutaneous QHS Lolita Patella B, MD   12 Units at 02/07/20 2251  . latanoprost (XALATAN) 0.005 % ophthalmic solution 1 drop  1 drop Right Eye QHS Zdeb, Christine, NP   1 drop at 02/07/20 2253  . linagliptin (TRADJENTA) tablet 5 mg  5 mg Oral Daily Georgeann Oppenheim, Sudheer B, MD   5 mg at 02/08/20 1019  . melatonin tablet 10 mg  10 mg Oral QHS Manuela Schwartz, NP   10 mg at 02/07/20 2252  . menthol-cetylpyridinium (CEPACOL) lozenge 3 mg  1 lozenge Oral PRN Zdeb, Christine, NP       Or  . phenol (CHLORASEPTIC) mouth spray 1 spray  1 spray  Mouth/Throat PRN Zdeb, Christine, NP      . ondansetron (ZOFRAN) tablet 4 mg  4 mg Oral Q6H PRN Zdeb, Christine, NP       Or  . ondansetron (ZOFRAN) injection 4 mg  4 mg Intravenous Q6H PRN Zdeb, Christine, NP      . oxyCODONE (Oxy IR/ROXICODONE) immediate release tablet 10 mg  10 mg Oral Q3H PRN Zdeb, Christine, NP      . oxyCODONE (Oxy IR/ROXICODONE) immediate release tablet 5 mg  5 mg Oral Q3H PRN Zdeb, Christine, NP      . polyethylene glycol (MIRALAX / GLYCOLAX) packet 17 g  17 g Oral Daily PRN Zdeb, Christine, NP      . polyethylene glycol (MIRALAX / GLYCOLAX) packet 17 g  17 g Oral q AM [name], MD   17 g at 02/06/20 0258  . senna (SENOKOT) tablet 17.2 mg  2 tablet Oral BID Zdeb, Christine, NP   17.2 mg at 02/07/20 0916  . simvastatin (ZOCOR) tablet 20 mg  20 mg Oral Daily Zdeb, Christine, NP   20 mg at 02/07/20 2252  . sodium chloride flush (NS) 0.9 % injection 3 mL  3 mL Intravenous Q12H Zdeb, Christine, NP   3 mL at 02/08/20 1025  . sodium chloride flush (NS) 0.9 % injection 3 mL  3 mL Intravenous PRN Zdeb, Christine, NP      . sodium phosphate (FLEET) 7-19 GM/118ML enema 1 enema  1 enema Rectal Once PRN Zdeb, Christine, NP      . tamsulosin (FLOMAX) capsule 0.4 mg  0.4 mg Oral QPC supper Zdeb, Christine, NP   0.4 mg at 02/07/20 1719  . traZODone (DESYREL) tablet 50 mg  50 mg Oral QHS PRN Lolita Patella B, MD   50 mg at 02/06/20 2318     Discharge Medications: Please see discharge summary for a list of discharge medications.  Relevant Imaging Results:  Relevant Lab Results:   Additional Information # [id number]  Trenton Founds, RN

## 2020-02-08 NOTE — Progress Notes (Signed)
Physical Therapy Treatment Patient Details Name: Roy [name]. MRN: [id number] DOB: 02-12-1946 Today's Date: 02/08/2020    History of Present Illness Roy [name] is a 74 y.o. male who presented to the [facility] emergency department on 01/14/20 with concern for worsening weakness in his lower extremities.  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 He DC'd briefly to SNF to await sx, and is now s/p C 3-4 anterior cervical decompression/discectomy. PMH significant for chronic diastolic heart failure, HTN, HLD, DM2, GERD, and left eye blindness.    PT Comments    Pt seen this pm. Wife at bedside, pt appears more awake at this time, with ability to follow commands.  Pt participated in supine strengthening exercises without c/o pain. Pt required assistance to attain end range for Left hip ABD and flexion due to weakness. Pt encouraged to continue exercises independently until next PT session. Con't PT per POC. Pt awaiting CIR placement.  Follow Up Recommendations        Equipment Recommendations       Recommendations for Other Services       Precautions / Restrictions Precautions Precautions: Fall Precaution Booklet Issued: No Precaution Comments: knees buckle Required Braces or Orthoses: Other Brace Other Brace: new order for soft cervical brace for pt comfort; bilat knee braces    Mobility  Bed Mobility                  Transfers                    Ambulation/Gait                 Stairs             Wheelchair Mobility    Modified Rankin (Stroke Patients Only)       Balance                                            Cognition                                              Exercises Other Exercises Other Exercises: Pt completed B LE AROM in supine x 15 reps consisting of PF/DF, QS, GS, Hip IR/ER, HS, Hip ABD/ADD. Pt required assistance for  end range for L LE hip ABD and hip flexion to attain end range due to weakness.    General Comments        Pertinent Vitals/Pain Pain Assessment: No/denies pain Pain Score: 0-No pain    Home Living                      Prior Function            PT Goals (current goals can now be found in the care plan section) Acute Rehab PT Goals Patient Stated Goal: to get my strength back Progress towards PT goals: Progressing toward goals    Frequency           PT Plan      Co-evaluation              AM-PAC PT "6 Clicks" Mobility   Outcome Measure  Help needed turning from  your back to your side while in a flat bed without using bedrails?: A Little Help needed moving from lying on your back to sitting on the side of a flat bed without using bedrails?: A Lot Help needed moving to and from a bed to a chair (including a wheelchair)?: A Lot Help needed standing up from a chair using your arms (e.g., wheelchair or bedside chair)?: A Lot Help needed to walk in hospital room?: Total Help needed climbing 3-5 steps with a railing? : Total 6 Click Score: 11    End of Session               Time: 1450-1505 PT Time Calculation (min) (ACUTE ONLY): 15 min  Charges:  $Therapeutic Exercise: 8-22 mins                     Zadie Cleverly, PTA   Jannet Askew 02/08/2020, 3:07 PM

## 2020-02-08 NOTE — Progress Notes (Signed)
Inpatient Rehabilitation Admissions Coordinator  [facility] has requested peer to peer with treating MD. I await peer to peer completion.  Ottie Glazier, RN, MSN Rehab Admissions Coordinator [phone] 02/08/2020 12:49 PM

## 2020-02-08 NOTE — Progress Notes (Signed)
Procedure: C3-4 ACDF Procedure date: 01/29/2020 Diagnosis: cervical radiculopathy    History: Roy [name]. is s/p C3-4 ACDF POD10: Roy [name] continues to await bed placement. He was seen this morning and denies all complaints. Discussed with Dr. Adriana Simas and we will remove his foley catheter today for a TOV.  POD9: Roy [name] continues to await rehab placement but otherwise has no complaints. He continues to work with PT/OT. He is currently recommended for acute rehab and this is in process.  POD8: Roy [name] continues to do well postoperatively, waiting on placement. No complaints this morning.  POD7: Doing well. No complaints.  POD6: Roy [name] was seen this morning, sleepy but arousable. He denies pain, his only complaint is that he didn't sleep well last night.  He is still pending disposition.   POD4: Roy [name] is doing well s/p ACDF.  He does feel like his hand strength has improved and has less pain.  He continues to have numbness.  He did stand with physical therapy yesterday but had some difficulty with pain in his left knee.  He does feel he is improving with them.  He has no difficulty swallowing.  He is not complaining of any significant neck pain.   Physical Exam: Vitals:   02/07/20 1559 02/08/20 0018  BP: (!) 157/79 122/77  Pulse: 94 88  Resp: 16 18  Temp: 98.3 F (36.8 C) 99.8 F (37.7 C)  SpO2: 95% 97%    General: drowsy, lying in bed Strength:5/5 throughout, effort dependent Sensation: Intact in bilateral lower extremities, decreased in bilateral hands Skin: Incision C/D/I, neck is soft   Assessment/Plan:  Roy [name]. is POD9 s/p ACDF C3-4.  He is doing well and is safe for discharge from a medical and surgical standpoint. We are awaiting disposition planning from case management.    #ACDF C3-4 - mobilize - pain control - PTOT, will need rehab placement - Imaging reviewed, good alignment, no complications  #DVT Prophylaxis - DVT  prophylaxis Lovenox 40mg  subq qd - SCDs - Ambulation  ##DM2, CHF, CAD - Continue home medications  - Carb modified diet  #Foley for urinary retention - TOV today - If unable to void, will consult urology   Disposition per case management, pending placement at CIR  , NP Neurosurgery

## 2020-02-08 NOTE — Progress Notes (Addendum)
Inpatient Rehabilitation Admissions Coordinator  I have received a denial after peer to peer with payor MD. I spoke with pt's wife by phone to provide denial information. She will let me know in th next 24 hrs if she would want to pursue expedited appeal. TOC  To discuss SNF options. If wife would like to pursue an expedited appeal, she would call [phone] to initiate appeal. I would then need to be contacted to fax supporting clinicals. Wife has my number to call if she would like to pursue appeal.  Ottie Glazier, RN, MSN Rehab Admissions Coordinator [phone] 02/08/2020 3:37 PM

## 2020-02-08 NOTE — TOC Progression Note (Signed)
Transition of Care (TOC) - Progression Note    Patient Details  Name: Roy [name]. MRN: [id number] Date of Birth: [birth year]  Transition of Care [facility]) CM/SW Contact  Shelbie Ammons, RN Phone Number: 02/08/2020, 3:56 PM  Clinical Narrative:   RNCM met with patient and wife in room. Discussed that peer to peer had been completed this afternoon for authorization and it had been denied. Discussed placement at SNF and she is hesitent but agreeable. Reviewed Medicare.gov website and ratings. After some discussion patient and wife are agreeable to sending referral to [facility] in [place] in Roy [name] completed FL-2 and sent bed request through the hub and faxed referral to [facility] at [phone].     Expected Discharge Plan: IP Rehab Facility Barriers to Discharge: Continued Medical Work up  Expected Discharge Plan and Services Expected Discharge Plan: [place]     Post Acute Care Choice: IP Rehab Living arrangements for the past 2 months: Single Family Home                                       Social Determinants of Health (SDOH) Interventions    Readmission Risk Interventions No flowsheet data found.

## 2020-02-08 NOTE — Progress Notes (Signed)
Pt's foley catheter was removed this morning, unable to urinate, bladder scanned in 6 hours and I&O cathed for > . As the initial foley catheter was placed for urinary retention, I called the urology MD on call, Dr. Vanna Scotland, who recommended replacement of foley catheter and to have pt follow up as outpatient.

## 2020-02-08 NOTE — Progress Notes (Signed)
PT Cancellation Note  Patient Details Name: Roy [name]. MRN: [id number] DOB: 1946/03/03   Cancelled Treatment:     PT attempt. PT hold. Pt very lethargic this morning. Spouse sitting at bedside requesting therapist return later this date when pt is more alert. Will continue to follow per POC and progress as able.    Rushie Chestnut 02/08/2020, 11:49 AM

## 2020-02-09 LAB — GLUCOSE, CAPILLARY
Glucose-Capillary: 77 mg/dL (ref 70–99)
Glucose-Capillary: 200 mg/dL — ABNORMAL HIGH (ref 70–99)
Glucose-Capillary: 134 mg/dL — ABNORMAL HIGH (ref 70–99)
Glucose-Capillary: 134 mg/dL — ABNORMAL HIGH (ref 70–99)

## 2020-02-09 NOTE — Progress Notes (Signed)
Physical Therapy Treatment Patient Details Name: Roy [name]. MRN: [id number] DOB: Sep 27, 1945 Today's Date: 02/09/2020    History of Present Illness Izaia Say" Harold is a 74 y.o. male who presented to the [facility] emergency department on 01/14/20 with concern for worsening weakness in his lower extremities.  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 He DC'd briefly to SNF to await sx, and is now s/p C 3-4 anterior cervical decompression/discectomy. PMH significant for chronic diastolic heart failure, HTN, HLD, DM2, GERD, and left eye blindness.    PT Comments    Pt was long sitting in bed upon arriving. Agrees to PT session. Pt is A and O however gets anxious with all mobility. Required +2 assist to safely achieve EOB short sit. Once feet supported on floor and BUE support, pt able to progress to close SBA. Performed AROM/AAROM in sitting to promote strengthening. Pt fatigues quickly with limited activity. He will need extensive PT going forward to address deficits and return to PLOF. Recommend DC to SNF. Pt was in bed at conclusion of session with call bell in reach and spouse at bedside.     Follow Up Recommendations  SNF     Equipment Recommendations  Other (comment) (defer to next level of care)    Recommendations for Other Services       Precautions / Restrictions Precautions Precautions: Fall Precaution Booklet Issued: No Precaution Comments: knees buckle Required Braces or Orthoses: Other Brace Other Brace: new order for soft cervical brace for pt comfort; bilat knee braces Restrictions Weight Bearing Restrictions: No    Mobility  Bed Mobility Overal bed mobility: Needs Assistance Bed Mobility: Supine to Sit;Sit to Supine Rolling: Max assist Sidelying to sit: Max assist;+2 for safety/equipment Supine to sit: Max assist;+2 for safety/equipment Sit to supine: Max assist;+2 for safety/equipment   General bed  mobility comments: pt was able to roll L to short sit with max assist and vcs. Spouse wanted to attempt without assistance however unable.   Transfers  General transfer comment: did not progress to OOB activity  Ambulation/Gait    General Gait Details: pt unable to/unsafe to try      Balance Overall balance assessment: Needs assistance Sitting-balance support: Feet supported;Bilateral upper extremity supported Sitting balance-Leahy Scale: Good Sitting balance - Comments: good sitting balance EOB once BLE supported and BUE supported       Cognition Arousal/Alertness: Awake/alert Behavior During Therapy: Anxious;WFL for tasks assessed/performed Overall Cognitive Status: Within Functional Limits for tasks assessed      General Comments: pt is A and O but has anxiety with movements. fearful of falling      Exercises General Exercises - Lower Extremity Quad Sets: AROM;10 reps;Supine Long Arc Quad: AROM;Seated (3x 10) Hip Flexion/Marching: AROM;20 reps;Both;Seated        Pertinent Vitals/Pain Pain Assessment:  (endorses pain in L IT band)           PT Goals (current goals can now be found in the care plan section) Acute Rehab PT Goals Patient Stated Goal: to get my strength back Progress towards PT goals: Not progressing toward goals - comment    Frequency    7X/week      PT Plan Current plan remains appropriate    Co-evaluation     PT goals addressed during session: Mobility/safety with mobility;Strengthening/ROM        AM-PAC PT "6 Clicks" Mobility   Outcome Measure  Help needed turning from your  back to your side while in a flat bed without using bedrails?: A Lot Help needed moving from lying on your back to sitting on the side of a flat bed without using bedrails?: A Lot Help needed moving to and from a bed to a chair (including a wheelchair)?: A Lot Help needed standing up from a chair using your arms (e.g., wheelchair or bedside chair)?:  Total Help needed to walk in hospital room?: Total Help needed climbing 3-5 steps with a railing? : Total 6 Click Score: 9    End of Session Equipment Utilized During Treatment: Gait belt Activity Tolerance: Patient tolerated treatment well;Patient limited by fatigue Patient left: in bed;with call bell/phone within reach;with bed alarm set;with family/visitor present Nurse Communication: Mobility status PT Visit Diagnosis: Unsteadiness on feet (R26.81);Other abnormalities of gait and mobility (R26.89);Repeated falls (R29.6);Muscle weakness (generalized) (M62.81);History of falling (Z91.81);Difficulty in walking, not elsewhere classified (R26.2);Other symptoms and signs involving the nervous system (R29.898)     Time: 8889-1694 PT Time Calculation (min) (ACUTE ONLY): 30 min  Charges:  $Therapeutic Exercise: 8-22 mins $Therapeutic Activity: 8-22 mins                     Jetta Lout PTA 02/09/20, 1:56 PM

## 2020-02-09 NOTE — Progress Notes (Signed)
Procedure: C3-4 ACDF Procedure date: 01/29/2020 Diagnosis: cervical radiculopathy    History: Roy [name]. is s/p C3-4 ACDF POD11:  Patient laying comfortably in bed with no complaints. POD10: [name] continues to await bed placement. He was seen this morning and denies all complaints. Discussed with Dr. Adriana Simas and we will remove his foley catheter today for a TOV.  POD9: [name] continues to await rehab placement but otherwise has no complaints. He continues to work with PT/OT. He is currently recommended for acute rehab and this is in process.  POD8: Mr House continues to do well postoperatively, waiting on placement. No complaints this morning.  POD7: Doing well. No complaints.  POD6: [name] was seen this morning, sleepy but arousable. He denies pain, his only complaint is that he didn't sleep well last night.  He is still pending disposition.   POD4: [name] is doing well s/p ACDF.  He does feel like his hand strength has improved and has less pain.  He continues to have numbness.  He did stand with physical therapy yesterday but had some difficulty with pain in his left knee.  He does feel he is improving with them.  He has no difficulty swallowing.  He is not complaining of any significant neck pain.   Physical Exam:     Vitals:   02/07/20 1559 02/08/20 0018  BP: (!) 157/79 122/77  Pulse: 94 88  Resp: 16 18  Temp: 98.3 F (36.8 C) 99.8 F (37.7 C)  SpO2: 95% 97%    General: drowsy, lying in bed Strength:5/5 throughout, effort dependent Sensation: Intact in bilateral lower extremities, decreased in bilateral hands Skin: Incision C/D/I, neck is soft   Assessment/Plan:  Roy [name]. is POD10 s/p ACDF C3-4.  He is doing well and is safe for discharge from a medical and surgical standpoint. We are awaiting disposition planning from case management.    #ACDF C3-4 - mobilize - pain control - PTOT, will need rehab  placement - Imaging reviewed, good alignment, no complications  #DVT Prophylaxis - DVT prophylaxis Lovenox 40mg  subq qd - SCDs - Ambulation  ##DM2, CHF, CAD - Continue home medications  - Carb modified diet  #Foley for urinary retention - will keep in for outpatient f/u   Disposition per case management  MD, PhD

## 2020-02-09 NOTE — Progress Notes (Signed)
End of Shift:  No acute changes overnight. Gave report to Welda, R.N. . Patient resting in bed comfortably. No signs of distress. Call bell in reach.

## 2020-02-09 NOTE — Plan of Care (Signed)
  Problem: Education: Goal: Knowledge of General Education information will improve Description: Including pain rating scale, medication(s)/side effects and non-pharmacologic comfort measures Outcome: Progressing   Problem: Pain Managment: Goal: General experience of comfort will improve Outcome: Progressing   Problem: Safety: Goal: Ability to remain free from injury will improve Outcome: Progressing   Problem: Skin Integrity: Goal: Risk for impaired skin integrity will decrease Outcome: Progressing   Problem: Education: Goal: Ability to verbalize activity precautions or restrictions will improve Outcome: Progressing Goal: Knowledge of the prescribed therapeutic regimen will improve Outcome: Progressing Goal: Understanding of discharge needs will improve Outcome: Progressing   Problem: Activity: Goal: Ability to avoid complications of mobility impairment will improve Outcome: Progressing Goal: Ability to tolerate increased activity will improve Outcome: Progressing Goal: Will remain free from falls Outcome: Progressing   Problem: Bowel/Gastric: Goal: Gastrointestinal status for postoperative course will improve Outcome: Progressing   Problem: Clinical Measurements: Goal: Ability to maintain clinical measurements within normal limits will improve Outcome: Progressing Goal: Postoperative complications will be avoided or minimized Outcome: Progressing Goal: Diagnostic test results will improve Outcome: Progressing   Problem: Pain Management: Goal: Pain level will decrease Outcome: Progressing   Problem: Skin Integrity: Goal: Will show signs of wound healing Outcome: Progressing   Problem: Health Behavior/Discharge Planning: Goal: Identification of resources available to assist in meeting health care needs will improve Outcome: Progressing   Problem: Bladder/Genitourinary: Goal: Urinary functional status for postoperative course will improve Outcome:  Progressing

## 2020-02-10 LAB — GLUCOSE, CAPILLARY
Glucose-Capillary: 84 mg/dL (ref 70–99)
Glucose-Capillary: 129 mg/dL — ABNORMAL HIGH (ref 70–99)
Glucose-Capillary: 179 mg/dL — ABNORMAL HIGH (ref 70–99)
Glucose-Capillary: 196 mg/dL — ABNORMAL HIGH (ref 70–99)

## 2020-02-10 NOTE — Progress Notes (Signed)
Physical Therapy Treatment Patient Details Name: Roy [name]. MRN: [id number] DOB: [birth year] Today's Date: 02/10/2020    History of Present Illness Roy [name]" Lalor is a 74 y.o. male who presented to the [facility] emergency department on 01/14/20 with concern for worsening weakness in his lower extremities.  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 He DC'd briefly to SNF to await sx, and is now s/p C 3-4 anterior cervical decompression/discectomy. PMH significant for chronic diastolic heart failure, HTN, HLD, DM2, GERD, and left eye blindness.    PT Comments    To EOB with improved ability to move BLE's toward EOB but limited by ability to roll and assist with sitting.  Max a to get to EOB.  Initially dizzy but resolves.  Sitting x 20 minutes for BLE AROM to tolerance.  Returned to supine with max a x 2 and repositioned for comfort.     Follow Up Recommendations  SNF     Equipment Recommendations       Recommendations for Other Services       Precautions / Restrictions Precautions Precautions: Fall Precaution Booklet Issued: No Precaution Comments: knees buckle Required Braces or Orthoses: Other Brace Other Brace: new order for soft cervical brace for pt comfort; bilat knee braces Restrictions Weight Bearing Restrictions: No Other Position/Activity Restrictions: B shoulder pain/weakness limting UE assist.    Mobility  Bed Mobility Overal bed mobility: Needs Assistance Bed Mobility: Supine to Sit;Sit to Supine Rolling: Max assist Sidelying to sit: Max assist   Sit to supine: Max assist;+2 for safety/equipment      Transfers                    Ambulation/Gait                 Stairs             Wheelchair Mobility    Modified Rankin (Stroke Patients Only)       Balance Overall balance assessment: Needs assistance Sitting-balance support: Feet supported;Bilateral upper extremity  supported Sitting balance-Leahy Scale: Good                                      Cognition Arousal/Alertness: Awake/alert Behavior During Therapy: WFL for tasks assessed/performed;Anxious Overall Cognitive Status: Within Functional Limits for tasks assessed                                 General Comments: pt is A and O but has anxiety with movements. fearful of falling      Exercises Other Exercises Other Exercises: seated AROM to tolerance in sitting.  more difficulty with L vs R    General Comments        Pertinent Vitals/Pain Pain Assessment: Faces Faces Pain Scale: Hurts even more Pain Location: L knee/ quad Pain Descriptors / Indicators: Cramping;Sore Pain Intervention(s): Limited activity within patient's tolerance;Monitored during session;Repositioned    Home Living                      Prior Function            PT Goals (current goals can now be found in the care plan section) Progress towards PT goals: Progressing toward goals    Frequency    7X/week  PT Plan Current plan remains appropriate    Co-evaluation              AM-PAC PT "6 Clicks" Mobility   Outcome Measure  Help needed turning from your back to your side while in a flat bed without using bedrails?: A Lot Help needed moving from lying on your back to sitting on the side of a flat bed without using bedrails?: A Lot Help needed moving to and from a bed to a chair (including a wheelchair)?: A Lot Help needed standing up from a chair using your arms (e.g., wheelchair or bedside chair)?: Total Help needed to walk in hospital room?: Total Help needed climbing 3-5 steps with a railing? : Total 6 Click Score: 9    End of Session Equipment Utilized During Treatment: Gait belt Activity Tolerance: Patient tolerated treatment well;Patient limited by fatigue Patient left: in bed;with call bell/phone within reach;with bed alarm set;with  family/visitor present         Time: 0177-9390 PT Time Calculation (min) (ACUTE ONLY): 30 min  Charges:  $Therapeutic Exercise: 8-22 mins $Therapeutic Activity: 8-22 mins                    Danielle Dess, PTA 02/10/20, 12:50 PM

## 2020-02-11 LAB — GLUCOSE, CAPILLARY
Glucose-Capillary: 117 mg/dL — ABNORMAL HIGH (ref 70–99)
Glucose-Capillary: 216 mg/dL — ABNORMAL HIGH (ref 70–99)
Glucose-Capillary: 178 mg/dL — ABNORMAL HIGH (ref 70–99)
Glucose-Capillary: 153 mg/dL — ABNORMAL HIGH (ref 70–99)

## 2020-02-11 NOTE — Progress Notes (Signed)
Occupational Therapy Treatment Patient Details Name: Roy [name]. MRN: [id number] DOB: [birth year] Today's Date: 02/11/2020    History of present illness Roy [name] is a 74 y.o. male who presented to the [facility] emergency department on 01/14/20 with concern for worsening weakness in his lower extremities.  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 He DC'd briefly to SNF to await sx, and is now s/p C 3-4 anterior cervical decompression/discectomy. PMH significant for chronic diastolic heart failure, HTN, HLD, DM2, GERD, and left eye blindness.   OT comments  Roy [name] was seen for OT treatment on this date. Upon arrival to room pt reclined in bed c son at bedside, agreeable to session. Pt reports pain in LLE, not quantified. MAX A don B knee braces and socks at bed level. Max A sup>sit, pt reported increased anxiousness exiting R side 2/2 usually exiting L side for PT sessions. Pt performed STS with +2 assist for safety. STS Lift used to safely progress pt to recliner from EOB. Pt tolerated ~2 mins standing for perihygiene c MAX A. MIN A self-drinking seated in chair - poor BUE shoulder and digit ROM. AAROM reclined in chair - pt and family educated on BUE HEP. Pt making good progress toward goals. Pt continues to benefit from skilled OT services to maximize return to PLOF and minimize risk of future falls, injury, caregiver burden, and readmission. Will continue to follow POC. Goals remain appropriate. Discharge recommendation remains appropriate.    Follow Up Recommendations  SNF    Equipment Recommendations  3 in 1 bedside commode (BARI BSC)    Recommendations for Other Services      Precautions / Restrictions Precautions Precautions: Fall Precaution Comments: knees buckle Required Braces or Orthoses: Other Brace Other Brace: new order for soft cervical brace for pt comfort; bilat knee braces Restrictions Weight Bearing  Restrictions: No       Mobility Bed Mobility Overal bed mobility: Needs Assistance Bed Mobility: Supine to Sit     Supine to sit: Max assist;HOB elevated        Transfers Overall transfer level: Needs assistance        General transfer comment: Pt performed STS with +2 assist for safety. Lift used to safely progress pt to recliner from EOB    Balance Overall balance assessment: Needs assistance Sitting-balance support: Feet supported;Bilateral upper extremity supported Sitting balance-Leahy Scale: Good      ADL either performed or assessed with clinical judgement   ADL Overall ADL's : Needs assistance/impaired        General ADL Comments: MAX A don B knee braces and socks at bed level. MIN A self-drinking seated in chair - poor BUE shoulder and digit ROM. MAX A perihygiene in standing               Cognition Arousal/Alertness: Awake/alert Behavior During Therapy: WFL for tasks assessed/performed;Anxious Overall Cognitive Status: Within Functional Limits for tasks assessed      General Comments: anxious with mobility         Exercises Exercises: Other exercises Other Exercises Other Exercises: Pt educated re: HEP, importance of OOB for strengthening Other Exercises: LBD, self-drinking, toileting, sup>sit, sit<>stand, sitting/standing balance/tolerance, AAROM           Pertinent Vitals/ Pain       Pain Assessment: Faces Faces Pain Scale: Hurts even more Pain Location: L knee/ quad, LUE c ROM Pain Descriptors / Indicators: Cramping;Sore Pain Intervention(s): Limited  activity within patient's tolerance;Monitored during session;Premedicated before session;Repositioned         Frequency  Min 3X/week        Progress Toward Goals  OT Goals(current goals can now be found in the care plan section)  Progress towards OT goals: Progressing toward goals  Acute Rehab OT Goals Patient Stated Goal: to get my strength back OT Goal Formulation: With  patient/family Time For Goal Achievement: 02/25/20 Potential to Achieve Goals: Good ADL Goals Pt Will Perform Lower Body Dressing: sit to/from stand;with adaptive equipment;with mod assist (c LRAD PRN for improved safety and functional independence.) Pt Will Transfer to Toilet: stand pivot transfer;bedside commode;with mod assist (c LRAD PRN for improved safety and functional independence.) Pt Will Perform Toileting - Clothing Manipulation and hygiene: with min assist;sit to/from stand;with adaptive equipment (c LRAD PRN for improved safety and functional independence.)  Plan Discharge plan remains appropriate;Frequency remains appropriate       AM-PAC OT "6 Clicks" Daily Activity     Outcome Measure   Help from another person eating meals?: A Lot Help from another person taking care of personal grooming?: A Little Help from another person toileting, which includes using toliet, bedpan, or urinal?: A Lot Help from another person bathing (including washing, rinsing, drying)?: A Lot Help from another person to put on and taking off regular upper body clothing?: A Lot Help from another person to put on and taking off regular lower body clothing?: A Lot 6 Click Score: 13    End of Session Equipment Utilized During Treatment: Other (comment) (B knee brace, sit to stand lift)  OT Visit Diagnosis: Unsteadiness on feet (R26.81);Muscle weakness (generalized) (M62.81)   Activity Tolerance Patient tolerated treatment well   Patient Left in chair;with call bell/phone within reach;with chair alarm set   Nurse Communication          Time: 8295-6213 OT Time Calculation (min): 38 min  Charges: OT General Charges $OT Visit: 1 Visit OT Treatments $Self Care/Home Management : 23-37 mins $Therapeutic Activity: 8-22 mins  Kathie Dike, M.S. OTR/L  02/11/20, 1:51 PM  ascom [phone]

## 2020-02-11 NOTE — Care Management Important Message (Signed)
Important Message  Patient Details  Name: Roy [name]. MRN: [id number] Date of Birth: Jul 20, 1945   Medicare Important Message Given:  Yes     Olegario Messier A Alexsa Flaum 02/11/2020, 10:59 AM

## 2020-02-11 NOTE — Progress Notes (Addendum)
Physical Therapy Treatment Patient Details Name: Roy [name]. MRN: [id number] DOB: 1946-03-09 Today's Date: 02/11/2020    History of Present Illness Roy [name] is a 74 y.o. male who presented to the [facility] emergency department on 01/14/20 with concern for worsening weakness in his lower extremities.  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 He DC'd briefly to SNF to await sx, and is now s/p C 3-4 anterior cervical decompression/discectomy. PMH significant for chronic diastolic heart failure, HTN, HLD, DM2, GERD, and left eye blindness.    PT Comments    Pt tolerated OOB x 4 hours today using Roy [name] sit to stand lift.  He is able to assist some and use LE's for WB during session.  Pt stated he was able to put his legs down with assist of son several times for ex. Pt needs encouragement to assist with pulling forward in chair to assist with sling placement.  He needs +2 assist to return to supine.  Pt is good about doing his HEP on his own.    Pt enjoys getting OOB around 11:00.  Scheduled with OT today fot OT to get him up and PT to return later in PM to assist with back to bed.  If OOB with lift tomorrow will need to do similar with OT staff or review lift with pt's tech tomorrow.     Follow Up Recommendations  SNF     Equipment Recommendations       Recommendations for Other Services       Precautions / Restrictions Precautions Precautions: Fall Precaution Comments: knees buckle Required Braces or Orthoses: Other Brace Other Brace: new order for soft cervical brace for pt comfort; bilat knee braces Restrictions Weight Bearing Restrictions: No Other Position/Activity Restrictions: B shoulder pain/weakness limting UE assist.    Mobility  Bed Mobility Overal bed mobility: Needs Assistance Bed Mobility: Supine to Sit     Supine to sit: Max assist;HOB elevated Sit to supine: Max assist;+2 for safety/equipment       Transfers Overall transfer level: Needs assistance     Sit to Stand: +2 safety/equipment         General transfer comment: tolerated transfer with Sara/sit to stand lift well.  Ambulation/Gait                 Stairs             Wheelchair Mobility    Modified Rankin (Stroke Patients Only)       Balance Overall balance assessment: Needs assistance Sitting-balance support: Feet supported;Bilateral upper extremity supported Sitting balance-Leahy Scale: Good                                      Cognition Arousal/Alertness: Awake/alert Behavior During Therapy: WFL for tasks assessed/performed Overall Cognitive Status: Within Functional Limits for tasks assessed                                 General Comments: anxious with mobility       Exercises Other Exercises Other Exercises: Pt educated re: HEP, importance of OOB for strengthening Other Exercises: LBD, self-drinking, toileting, sup>sit, sit<>stand, sitting/standing balance/tolerance, AAROM    General Comments        Pertinent Vitals/Pain Pain Assessment: Faces Faces Pain Scale: Hurts little more Pain  Location: L knee/ quad, LUE c ROM Pain Descriptors / Indicators: Cramping;Sore Pain Intervention(s): Limited activity within patient's tolerance;Monitored during session;Repositioned    Home Living                      Prior Function            PT Goals (current goals can now be found in the care plan section) Acute Rehab PT Goals Patient Stated Goal: to get my strength back Progress towards PT goals: Progressing toward goals    Frequency    7X/week      PT Plan Current plan remains appropriate    Co-evaluation              AM-PAC PT "6 Clicks" Mobility   Outcome Measure  Help needed turning from your back to your side while in a flat bed without using bedrails?: A Lot Help needed moving from lying on your back to sitting on the  side of a flat bed without using bedrails?: A Lot Help needed moving to and from a bed to a chair (including a wheelchair)?: A Lot Help needed standing up from a chair using your arms (e.g., wheelchair or bedside chair)?: Total Help needed to walk in hospital room?: Total Help needed climbing 3-5 steps with a railing? : Total 6 Click Score: 9    End of Session Equipment Utilized During Treatment: Gait belt Activity Tolerance: Patient tolerated treatment well;Patient limited by fatigue Patient left: in bed;with call bell/phone within reach;with bed alarm set;with family/visitor present         Time: 1420-1443 PT Time Calculation (min) (ACUTE ONLY): 23 min  Charges:  $Therapeutic Activity: 23-37 mins                    Danielle Dess, PTA 02/11/20, 3:14 PM

## 2020-02-11 NOTE — Progress Notes (Signed)
Procedure: C3-4 ACDF Procedure date: 01/29/2020 Diagnosis: cervical radiculopathy    History: Roy [name]. is s/p C3-4 ACDF POD13: Roy [name] was evaluated this morning. He is resting comfortably in bed.  He does endorse some L thigh "cramping" but this is tolerable. He was pleased with his progress yesterday with PT.  We continue to await bed placement.  POD11:  Patient laying comfortably in bed with no complaints. POD10: Roy [name] continues to await bed placement. He was seen this morning and denies all complaints. Discussed with Dr. Adriana Simas and we will remove his foley catheter today for a TOV.  POD9: Roy [name] continues to await rehab placement but otherwise has no complaints. He continues to work with PT/OT. He is currently recommended for acute rehab and this is in process.  POD8: Roy [name] continues to do well postoperatively, waiting on placement. No complaints this morning.  POD7: Doing well. No complaints.  POD6: Roy [name] was seen this morning, sleepy but arousable. He denies pain, his only complaint is that he didn't sleep well last night.  He is still pending disposition.   POD4: Roy [name] is doing well s/p ACDF.  He does feel like his hand strength has improved and has less pain.  He continues to have numbness.  He did stand with physical therapy yesterday but had some difficulty with pain in his left knee.  He does feel he is improving with them.  He has no difficulty swallowing.  He is not complaining of any significant neck pain.   Physical Exam:     Vitals:   02/07/20 1559 02/08/20 0018  BP: (!) 157/79 122/77  Pulse: 94 88  Resp: 16 18  Temp: 98.3 F (36.8 C) 99.8 F (37.7 C)  SpO2: 95% 97%    General: drowsy, lying in bed Strength:5/5 throughout, effort dependent Sensation: Intact in bilateral lower extremities, decreased in bilateral hands Skin: Incision C/D/I, neck is soft   Assessment/Plan:  Roy [name]. is POD13 s/p  ACDF C3-4.  He is doing well and is safe for discharge from a medical and surgical standpoint. We are awaiting disposition planning from case management.    #ACDF C3-4 - mobilize - pain control - PTOT, will need rehab placement - Imaging reviewed, good alignment, no complications  #DVT Prophylaxis - DVT prophylaxis Lovenox 40mg  subq qd - SCDs - Ambulation  ##DM2, CHF, CAD - Continue home medications  - Carb modified diet  #Foley for urinary retention - will keep in for outpatient f/u   Disposition per case management  , NP Neurosurgery

## 2020-02-12 LAB — GLUCOSE, CAPILLARY
Glucose-Capillary: 89 mg/dL (ref 70–99)
Glucose-Capillary: 175 mg/dL — ABNORMAL HIGH (ref 70–99)
Glucose-Capillary: 169 mg/dL — ABNORMAL HIGH (ref 70–99)
Glucose-Capillary: 150 mg/dL — ABNORMAL HIGH (ref 70–99)

## 2020-02-12 NOTE — Progress Notes (Signed)
Physical Therapy Treatment Patient Details Name: Roy [name]. MRN: [id number] DOB: 1945-11-24 Today's Date: 02/12/2020    History of Present Illness Roy [name] is a 74 y.o. male who presented to the [facility] emergency department on 01/14/20 with concern for worsening weakness in his lower extremities.  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 He DC'd briefly to SNF to await sx, and is now s/p C 3-4 anterior cervical decompression/discectomy. PMH significant for chronic diastolic heart failure, HTN, HLD, DM2, GERD, and left eye blindness.    PT Comments    Pt able to tolerate 4 hours of sitting upright in recliner before requesting to be transferred back to bed.  Pt noted that his buttocks was starting to go numb, however did not have pain or tissue damage upon examining.  Pt was then transferred back to the bed with use of the [place] sit to stand lift and use of +2 for safety and assistance with equipment.  All needs met within pt room with family member present.  Current discharge plans to SNF remain appropriate at this time.  Pt will continue to benefit from skilled therapy in order to address deficits listed below.    Follow Up Recommendations  SNF     Equipment Recommendations  Other (comment)    Recommendations for Other Services       Precautions / Restrictions Precautions Precautions: Fall Precaution Comments: knees buckle Required Braces or Orthoses: Other Brace Other Brace: new order for soft cervical brace for pt comfort; bilat knee braces Restrictions Weight Bearing Restrictions: No Other Position/Activity Restrictions: B shoulder pain/weakness limting UE assist.    Mobility  Bed Mobility Overal bed mobility: Needs Assistance         Sit to supine: Max assist;+2 for safety/equipment      Transfers Overall transfer level: Needs assistance     Sit to Stand: +2 safety/equipment         General  transfer comment: tolerated transfer with Sara/sit to stand lift well.  Ambulation/Gait                 Stairs             Wheelchair Mobility    Modified Rankin (Stroke Patients Only)       Balance Overall balance assessment: Needs assistance Sitting-balance support: Feet supported;Bilateral upper extremity supported Sitting balance-Leahy Scale: Good                                      Cognition Arousal/Alertness: Awake/alert Behavior During Therapy: WFL for tasks assessed/performed Overall Cognitive Status: Within Functional Limits for tasks assessed                                        Exercises      General Comments        Pertinent Vitals/Pain Pain Assessment: Faces Faces Pain Scale: Hurts little more Pain Location: L ITB Pain Descriptors / Indicators: Cramping;Sore Pain Intervention(s): Limited activity within patient's tolerance;Monitored during session;Repositioned    Home Living Family/patient expects to be discharged to:: Inpatient rehab Living Arrangements: Spouse/significant other Available Help at Discharge: Family Type of Home: House Home Access: Stairs to enter Entrance Stairs-Rails: Right (at garage steps) Home Layout: One level Home Equipment: Gilford Rile -  2 wheels;Walker - 4 wheels;Grab bars - tub/shower;Shower seat      Prior Function Level of Independence: Needs assistance  Gait / Transfers Assistance Needed: able to ambulate short distances with RW at home at baseline.   Comments: Wife reports that pt was ambulatory about 3 weeks ago and was unable to stand from toilet at home and she was unable to get him up physically.   PT Goals (current goals can now be found in the care plan section) Acute Rehab PT Goals Time For Goal Achievement: 02/13/20 Progress towards PT goals: Progressing toward goals    Frequency    7X/week      PT Plan Current plan remains appropriate    Co-evaluation               AM-PAC PT "6 Clicks" Mobility   Outcome Measure  Help needed turning from your back to your side while in a flat bed without using bedrails?: A Lot Help needed moving from lying on your back to sitting on the side of a flat bed without using bedrails?: A Lot Help needed moving to and from a bed to a chair (including a wheelchair)?: A Lot Help needed standing up from a chair using your arms (e.g., wheelchair or bedside chair)?: Total Help needed to walk in hospital room?: Total Help needed climbing 3-5 steps with a railing? : Total 6 Click Score: 9    End of Session   Activity Tolerance: Patient tolerated treatment well;Patient limited by fatigue Patient left: in bed;with call bell/phone within reach;with bed alarm set;with family/visitor present   PT Visit Diagnosis: Unsteadiness on feet (R26.81);Other abnormalities of gait and mobility (R26.89);Repeated falls (R29.6);Muscle weakness (generalized) (M62.81);History of falling (Z91.81);Difficulty in walking, not elsewhere classified (R26.2);Other symptoms and signs involving the nervous system (R29.898)     Time: 4604-7998 PT Time Calculation (min) (ACUTE ONLY): 8 min  Charges:  $Therapeutic Activity: 8-22 mins                     Gwenlyn Saran, PT, DPT 02/12/20, 4:33 PM

## 2020-02-12 NOTE — Progress Notes (Signed)
Procedure: C3-4 ACDF Procedure date: 01/29/2020 Diagnosis: cervical radiculopathy    History: Roy [name]. is s/p C3-4 ACDF POD14: [name] seen this morning. No change in assessment. Case management working with patient and awaiting insurance approval for a bed at SNF.  POD13: [name] was evaluated this morning. He is resting comfortably in bed.  He does endorse some L thigh "cramping" but this is tolerable. He was pleased with his progress yesterday with PT.  We continue to await bed placement.  POD11:  Patient laying comfortably in bed with no complaints. POD10: [name] continues to await bed placement. He was seen this morning and denies all complaints. Discussed with Dr. Adriana [name] and we will remove his foley catheter today for a TOV.  POD9: [name] continues to await rehab placement but otherwise has no complaints. He continues to work with PT/OT. He is currently recommended for acute rehab and this is in process.  POD8: [name] continues to do well postoperatively, waiting on placement. No complaints this morning.  POD7: Doing well. No complaints.  POD6: [name] was seen this morning, sleepy but arousable. He denies pain, his only complaint is that he didn't sleep well last night.  He is still pending disposition.   POD4: [name] is doing well s/p ACDF.  He does feel like his hand strength has improved and has less pain.  He continues to have numbness.  He did stand with physical therapy yesterday but had some difficulty with pain in his left knee.  He does feel he is improving with them.  He has no difficulty swallowing.  He is not complaining of any significant neck pain.   Physical Exam:     Vitals:   02/07/20 1559 02/08/20 0018  BP: (!) 157/79 122/77  Pulse: 94 88  Resp: 16 18  Temp: 98.3 F (36.8 C) 99.8 F (37.7 C)  SpO2: 95% 97%    General: drowsy, lying in bed Strength:5/5 throughout, effort dependent Sensation: Intact in  bilateral lower extremities, decreased in bilateral hands Skin: Incision C/D/I, neck is soft   Assessment/Plan:  Roy [name]. is POD14 s/p ACDF C3-4.  He is doing well and is safe for discharge from a medical and surgical standpoint. We are awaiting disposition planning from case management.    #ACDF C3-4 - mobilize - pain control - PTOT, will need rehab placement - Imaging reviewed, good alignment, no complications  #DVT Prophylaxis - DVT prophylaxis Lovenox 40mg  subq qd - SCDs - Ambulation  ##DM2, CHF, CAD - Continue home medications  - Carb modified diet  #Foley for urinary retention - will keep in for outpatient f/u   Disposition per case management, hope to obtain information this week  , NP Neurosurgery

## 2020-02-12 NOTE — Progress Notes (Addendum)
Physical Therapy Treatment Patient Details Name: Roy [name]. MRN: [id number] DOB: June 16, 1945 Today's Date: 02/12/2020    History of Present Illness Kerri Asche" Cohick is a 74 y.o. male who presented to the [facility] emergency department on 01/14/20 with concern for worsening weakness in his lower extremities.  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 He DC'd briefly to SNF to await sx, and is now s/p C 3-4 anterior cervical decompression/discectomy. PMH significant for chronic diastolic heart failure, HTN, HLD, DM2, GERD, and left eye blindness.    PT Comments    Pt tolerated use of [place] Dec sit to stand lift during treatment session today.  Pt able to assist with transfer to EOB and has good trunk control when engaging muscles properly.  Pt requires +2 assist for safety and equipment management for safe transfer to chair.  Pt will be checked on later this PM (~4 hours of sitting in recliner.  Current discharge plans to SNF remain appropriate at this time.  Pt will continue to benefit from skilled therapy in order to address deficits listed below.    Follow Up Recommendations  SNF     Equipment Recommendations  Other (comment)    Recommendations for Other Services       Precautions / Restrictions Precautions Precautions: Fall Precaution Comments: knees buckle Required Braces or Orthoses: Other Brace Other Brace: new order for soft cervical brace for pt comfort; bilat knee braces Restrictions Other Position/Activity Restrictions: B shoulder pain/weakness limting UE assist.    Mobility  Bed Mobility Overal bed mobility: Needs Assistance         Sit to supine: Max assist;+2 for safety/equipment      Transfers Overall transfer level: Needs assistance     Sit to Stand: +2 safety/equipment         General transfer comment: tolerated transfer with Sara/sit to stand lift well.  Ambulation/Gait                  Stairs             Wheelchair Mobility    Modified Rankin (Stroke Patients Only)       Balance Overall balance assessment: Needs assistance Sitting-balance support: Feet supported;Bilateral upper extremity supported Sitting balance-Leahy Scale: Good                                      Cognition Arousal/Alertness: Awake/alert Behavior During Therapy: WFL for tasks assessed/performed Overall Cognitive Status: Within Functional Limits for tasks assessed                                        Exercises      General Comments        Pertinent Vitals/Pain Pain Assessment: Faces Faces Pain Scale: Hurts little more Pain Location: L ITB Pain Descriptors / Indicators: Cramping;Sore Pain Intervention(s): Limited activity within patient's tolerance;Monitored during session;Repositioned    Home Living Family/patient expects to be discharged to:: Inpatient rehab Living Arrangements: Spouse/significant other Available Help at Discharge: Family Type of Home: House Home Access: Stairs to enter Entrance Stairs-Rails: Right (at garage steps) Home Layout: One level Home Equipment: Environmental consultant - 2 wheels;Walker - 4 wheels;Grab bars - tub/shower;Shower seat      Prior Function Level of Independence: Needs assistance  Gait / Transfers Assistance Needed: able to ambulate short distances with RW at home at baseline.   Comments: Wife reports that pt was ambulatory about 3 weeks ago and was unable to stand from toilet at home and she was unable to get him up physically.   PT Goals (current goals can now be found in the care plan section) Acute Rehab PT Goals Time For Goal Achievement: 02/13/20 Progress towards PT goals: Progressing toward goals    Frequency    7X/week      PT Plan Current plan remains appropriate    Co-evaluation              AM-PAC PT "6 Clicks" Mobility   Outcome Measure  Help needed turning from your back to  your side while in a flat bed without using bedrails?: A Lot Help needed moving from lying on your back to sitting on the side of a flat bed without using bedrails?: A Lot Help needed moving to and from a bed to a chair (including a wheelchair)?: A Lot Help needed standing up from a chair using your arms (e.g., wheelchair or bedside chair)?: Total Help needed to walk in hospital room?: Total Help needed climbing 3-5 steps with a railing? : Total 6 Click Score: 9    End of Session   Activity Tolerance: Patient tolerated treatment well;Patient limited by fatigue Pt left in chair; with call bell/phone within reach; with chair alarm set; with family/visitor present   PT Visit Diagnosis: Unsteadiness on feet (R26.81);Other abnormalities of gait and mobility (R26.89);Repeated falls (R29.6);Muscle weakness (generalized) (M62.81);History of falling (Z91.81);Difficulty in walking, not elsewhere classified (R26.2);Other symptoms and signs involving the nervous system (D14.970)     Time: 2637-8588 PT Time Calculation (min) (ACUTE ONLY): 13 min  Charges:  $Therapeutic Activity: 8-22 mins                     Nolon Bussing, PT, DPT 02/12/20, 1:58 PM

## 2020-02-12 NOTE — TOC Progression Note (Signed)
Transition of Care (TOC) - Progression Note    Patient Details  Name: Roy [name]. MRN: [id number] Date of Birth: 1946/03/05  Transition of Care [facility]) CM/SW Contact  Shelbie Ammons, RN Phone Number: 02/12/2020, 8:24 AM  Clinical Narrative:   RNCM met with patient, wife and NP at bedside to discuss bed offer of [facility] which they accepted. Discussed that facility would still have to verify insurance authorization which could take a day or two. RNCM accepted bed in hub and notified Gerald Stabs so that insurance authorization could be started.     Expected Discharge Plan: IP Rehab Facility Barriers to Discharge: Continued Medical Work up  Expected Discharge Plan and Services Expected Discharge Plan: [place]     Post Acute Care Choice: IP Rehab Living arrangements for the past 2 months: Single Family Home                                       Social Determinants of Health (SDOH) Interventions    Readmission Risk Interventions No flowsheet data found.

## 2020-02-13 LAB — GLUCOSE, CAPILLARY
Glucose-Capillary: 98 mg/dL (ref 70–99)
Glucose-Capillary: 208 mg/dL — ABNORMAL HIGH (ref 70–99)
Glucose-Capillary: 170 mg/dL — ABNORMAL HIGH (ref 70–99)
Glucose-Capillary: 161 mg/dL — ABNORMAL HIGH (ref 70–99)

## 2020-02-13 MED ORDER — SENNA 8.6 MG PO TABS: 2.0000 | ORAL_TABLET | Freq: Every day | ORAL | 0 refills | Status: DC | PRN

## 2020-02-13 MED ORDER — AMLODIPINE BESYLATE 5 MG PO TABS
5.0000 mg | ORAL_TABLET | Freq: Every day | ORAL | Status: DC
Start: 2020-02-13 — End: 2020-05-05

## 2020-02-13 MED ORDER — OXYCODONE HCL 5 MG PO TABS
5.0000 mg | ORAL_TABLET | Freq: Two times a day (BID) | ORAL | 0 refills | Status: AC | PRN
Start: 2020-02-13 — End: 2020-02-20

## 2020-02-13 NOTE — Progress Notes (Signed)
Physical Therapy Treatment Patient Details Name: Roy [name]. MRN: [id number] DOB: 02/24/1946 Today's Date: 02/13/2020    History of Present Illness Aleem Elza" Shober is a 74 y.o. male who presented to the [facility] emergency department on 01/14/20 with concern for worsening weakness in his lower extremities.  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 He DC'd briefly to SNF to await sx, and is now s/p C 3-4 anterior cervical decompression/discectomy. PMH significant for chronic diastolic heart failure, HTN, HLD, DM2, GERD, and left eye blindness.    PT Comments    Pt received in supine position and agreeable to therapy.  Pt and wife report they have not been approved for SNF through insurance at this time.  Pt agreeable to performing bed-level exercises due to potentially being d/c later this PM.  Pt continues to demonstrate increased strength as therapy progresses.  Current discharge plans to SNF remain appropriate at this time.  Pt will continue to benefit from skilled therapy in order to address deficits listed below.     Follow Up Recommendations  SNF     Equipment Recommendations       Recommendations for Other Services       Precautions / Restrictions Precautions Precautions: Fall Precaution Comments: knees buckle Required Braces or Orthoses: Other Brace Other Brace: new order for soft cervical brace for pt comfort; bilat knee braces Restrictions Other Position/Activity Restrictions: B shoulder pain/weakness limting UE assist.    Mobility  Bed Mobility               General bed mobility comments: performed bed-level exercises during session today, due to expectation to be d/c later.  Transfers                    Ambulation/Gait                 Stairs             Wheelchair Mobility    Modified Rankin (Stroke Patients Only)       Balance       Sitting balance - Comments:  not assessed due to performing bed-level exercises.                                    Cognition Arousal/Alertness: Awake/alert Behavior During Therapy: WFL for tasks assessed/performed Overall Cognitive Status: Within Functional Limits for tasks assessed                                        Exercises Total Joint Exercises Ankle Circles/Pumps: AROM;Strengthening;Both;10 reps;Supine Quad Sets: AROM;Strengthening;Both;10 reps;Supine Gluteal Sets: AROM;Strengthening;Both;10 reps;Supine Heel Slides: AROM;Strengthening;Both;10 reps;Supine Hip ABduction/ADduction: AROM;Strengthening;Both;10 reps;Supine Straight Leg Raises: AROM;Strengthening;Both;10 reps;Supine Marching in Standing: AROM;Strengthening;Both;10 reps;Supine    General Comments        Pertinent Vitals/Pain Pain Assessment: Faces Faces Pain Scale: Hurts little more Pain Location: L ITB Pain Descriptors / Indicators: Cramping;Sore Pain Intervention(s): Limited activity within patient's tolerance;Monitored during session;Repositioned    Home Living                      Prior Function            PT Goals (current goals can now be found in the care plan section) Acute Rehab  PT Goals Time For Goal Achievement: 02/13/20 Progress towards PT goals: Progressing toward goals    Frequency    7X/week      PT Plan Current plan remains appropriate    Co-evaluation              AM-PAC PT "6 Clicks" Mobility   Outcome Measure  Help needed turning from your back to your side while in a flat bed without using bedrails?: A Lot Help needed moving from lying on your back to sitting on the side of a flat bed without using bedrails?: A Lot Help needed moving to and from a bed to a chair (including a wheelchair)?: A Lot Help needed standing up from a chair using your arms (e.g., wheelchair or bedside chair)?: Total Help needed to walk in hospital room?: Total Help needed  climbing 3-5 steps with a railing? : Total 6 Click Score: 9    End of Session   Activity Tolerance: Patient tolerated treatment well;Patient limited by fatigue Patient left: in bed;with call bell/phone within reach;with bed alarm set;with family/visitor present Nurse Communication: Mobility status PT Visit Diagnosis: Unsteadiness on feet (R26.81);Other abnormalities of gait and mobility (R26.89);Repeated falls (R29.6);Muscle weakness (generalized) (M62.81);History of falling (Z91.81);Difficulty in walking, not elsewhere classified (R26.2);Other symptoms and signs involving the nervous system (M63.817)     Time: 7116-5790 PT Time Calculation (min) (ACUTE ONLY): 12 min  Charges:  $Gait Training: 8-22 mins                     Nolon Bussing, PT, DPT 02/13/20, 4:52 PM

## 2020-02-13 NOTE — Discharge Summary (Signed)
Procedure: C3-4 ACDF Procedure date: 01/29/2020 Diagnosis: cervical radiculopathy    History: Roy Tay. is s/p C3-4 ACDF POD 15: Pt seen this morning, reports feeling well.  POD14: Roy [name] seen this morning. No change in assessment. Case management working with patient and awaiting insurance approval for a bed at SNF.  POD13: Roy [name] was evaluated this morning. He is resting comfortably in bed.  He does endorse some L thigh "cramping" but this is tolerable. He was pleased with his progress yesterday with PT.  We continue to await bed placement.  POD11:  Patient laying comfortably in bed with no complaints. POD10: Roy [name] continues to await bed placement. He was seen this morning and denies all complaints. Discussed with Dr. Adriana Simas and we will remove his foley catheter today for a TOV.  POD9: Roy [name] continues to await rehab placement but otherwise has no complaints. He continues to work with PT/OT. He is currently recommended for acute rehab and this is in process.  POD8: Roy [name] continues to do well postoperatively, waiting on placement. No complaints this morning.  POD7: Doing well. No complaints.  POD6: Roy [name] was seen this morning, sleepy but arousable. He denies pain, his only complaint is that he didn't sleep well last night.  He is still pending disposition.   POD4: Roy [name] is doing well s/p ACDF.  He does feel like his hand strength has improved and has less pain.  He continues to have numbness.  He did stand with physical therapy yesterday but had some difficulty with pain in his left knee.  He does feel he is improving with them.  He has no difficulty swallowing.  He is not complaining of any significant neck pain.   Physical Exam:     Vitals:   02/07/20 1559 02/08/20 0018  BP: (!) 157/79 122/77  Pulse: 94 88  Resp: 16 18  Temp: 98.3 F (36.8 C) 99.8 F (37.7 C)  SpO2: 95% 97%    General: drowsy, lying in bed Strength:5/5  throughout, effort dependent Sensation: Intact in bilateral lower extremities, decreased in bilateral hands Skin: Incision C/D/I, neck is soft   Assessment/Plan:  Roy Austria. is POD15 s/p ACDF C3-4.  He is doing well and is safe for discharge from a medical and surgical standpoint. We are awaiting disposition planning from case management.    #ACDF C3-4 - mobilize - pain control - PTOT, will need rehab placement - Imaging reviewed, good alignment, no complications  #DVT Prophylaxis - DVT prophylaxis Lovenox 40mg  subq qd - SCDs - Ambulation  ##DM2, CHF, CAD - Continue home medications  - Carb modified diet  #Foley for urinary retention - Admitted with foley catheter in place - Failed repeat TOV on 10/29, discussed with urology who recommend replacing foley cathter and keeping in until outpatient follow up with urology (they will schedule)  Disposition per case management, hope to obtain information this week.  He will follow up with Dr 11/29 in clinic (appt already scheduled).  Adriana Simas, NP Neurosurgery

## 2020-02-13 NOTE — TOC Progression Note (Signed)
Transition of Care (TOC) - Progression Note    Patient Details  Name: [name]. MRN: [id number] Date of Birth: Aug 31, 1945  Transition of Care [facility]) CM/SW Contact  Trenton Founds, RN Phone Number: 02/13/2020, 3:57 PM  Clinical Narrative:   RNCM reached out to facility several times throughout the day, insurance authorization not yet received.     Expected Discharge Plan: IP Rehab Facility Barriers to Discharge: Continued Medical Work up  Expected Discharge Plan and Services Expected Discharge Plan: IP Rehab Facility     Post Acute Care Choice: IP Rehab Living arrangements for the past 2 months: Single Family Home Expected Discharge Date: 02/13/20                                     Social Determinants of Health (SDOH) Interventions    Readmission Risk Interventions No flowsheet data found.

## 2020-02-14 LAB — GLUCOSE, CAPILLARY
Glucose-Capillary: 127 mg/dL — ABNORMAL HIGH (ref 70–99)
Glucose-Capillary: 149 mg/dL — ABNORMAL HIGH (ref 70–99)
Glucose-Capillary: 169 mg/dL — ABNORMAL HIGH (ref 70–99)
Glucose-Capillary: 181 mg/dL — ABNORMAL HIGH (ref 70–99)

## 2020-02-14 NOTE — Care Management Important Message (Signed)
Important Message  Patient Details  Name: Roy [name]. MRN: [id number] Date of Birth: 07-01-1945   Medicare Important Message Given:  Yes     Olegario Messier A Tamico Mundo 02/14/2020, 10:11 AM

## 2020-02-14 NOTE — Progress Notes (Signed)
Physical Therapy Treatment Patient Details Name: Roy [name]. MRN: [id number] DOB: 24-Sep-1945 Today's Date: 02/14/2020    History of Present Illness Roy [name] is a 74 y.o. male who presented to the [facility] emergency department on 01/14/20 with concern for worsening weakness in his lower extremities.  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 He DC'd briefly to SNF to await sx, and is now s/p C 3-4 anterior cervical decompression/discectomy. PMH significant for chronic diastolic heart failure, HTN, HLD, DM2, GERD, and left eye blindness.    PT Comments    Pt eager to work with PT and very pleasant t/o the session.  He lacks acitve TKE b/l and despite getting some quad contraction with associated exercises was not able to actively fully extend.  Pt c/o pain in L lateral thigh/ITB with some exercises but the pain response with not consistent within exercise reps.  Pt reports that he remains anxious about transfer/standing/lift, but states that he very much enjoys sitting at EOB and doing exercises in that safer but still challenging position.  Deferred transfers today, great effort with multiple reps of exercises in supine, sitting for U&LEs.  Follow Up Recommendations  SNF     Equipment Recommendations   (may need stander/lift)    Recommendations for Other Services       Precautions / Restrictions Precautions Precautions: Fall Precaution Booklet Issued: No Precaution Comments: knees buckle Restrictions Weight Bearing Restrictions: No    Mobility  Bed Mobility Overal bed mobility: Needs Assistance Bed Mobility: Supine to Sit     Supine to sit: Max assist Sit to supine: Max assist;+2 for safety/equipment   General bed mobility comments: Pt showed good effort, but ultimately needed extensive assist to get up to sitting,   Transfers                 General transfer comment: pt requests to just do prolonged  sit at EOB this date, no transfers/lifts today  Ambulation/Gait                 Stairs             Wheelchair Mobility    Modified Rankin (Stroke Patients Only)       Balance Overall balance assessment: Needs assistance Sitting-balance support: Feet supported;Bilateral upper extremity supported Sitting balance-Leahy Scale: Good Sitting balance - Comments: Pt was able to maitnain sitting EOB >10 minutes, performs exercises and and light dynamic tasks w/o needing direct assist to maintain standing                                    Cognition Arousal/Alertness: Awake/alert Behavior During Therapy: WFL for tasks assessed/performed Overall Cognitive Status: Within Functional Limits for tasks assessed                                        Exercises General Exercises - Lower Extremity Ankle Circles/Pumps: AAROM;15 reps (minimal L ankle A&PROM) Quad Sets: Strengthening;10 reps;AROM Short Arc Quad: AAROM;AROM;10 reps Long Arc Quad: AAROM;AROM;Seated;15 reps Heel Slides: AROM;10 reps (with resisted leg ext, ITB pain with L hip flexion >~70) Hip ABduction/ADduction: Strengthening;10 reps Other Exercises Other Exercises: lightly resisted elbow flx/ext, PROM L shoulder scaption strech    General Comments  Pertinent Vitals/Pain Pain Assessment: 0-10 Pain Score: 7  Pain Location: L ITB, unrated but increases inconsistently with activity    Home Living                      Prior Function            PT Goals (current goals can now be found in the care plan section) Progress towards PT goals: Progressing toward goals    Frequency    7X/week      PT Plan Current plan remains appropriate    Co-evaluation              AM-PAC PT "6 Clicks" Mobility   Outcome Measure  Help needed turning from your back to your side while in a flat bed without using bedrails?: A Lot Help needed moving from lying on your  back to sitting on the side of a flat bed without using bedrails?: A Lot Help needed moving to and from a bed to a chair (including a wheelchair)?: Total Help needed standing up from a chair using your arms (e.g., wheelchair or bedside chair)?: Total Help needed to walk in hospital room?: Total Help needed climbing 3-5 steps with a railing? : Total 6 Click Score: 8    End of Session Equipment Utilized During Treatment: Gait belt Activity Tolerance: Patient tolerated treatment well;Patient limited by fatigue Patient left: in bed;with call bell/phone within reach;with bed alarm set;with family/visitor present Nurse Communication: Mobility status PT Visit Diagnosis: Unsteadiness on feet (R26.81);Other abnormalities of gait and mobility (R26.89);Repeated falls (R29.6);Muscle weakness (generalized) (M62.81);History of falling (Z91.81);Difficulty in walking, not elsewhere classified (R26.2);Other symptoms and signs involving the nervous system (R29.898)     Time: 8099-8338 PT Time Calculation (min) (ACUTE ONLY): 49 min  Charges:  $Therapeutic Exercise: 23-37 mins $Therapeutic Activity: 8-22 mins                     Malachi Pro, DPT 02/14/2020, 4:55 PM

## 2020-02-15 LAB — GLUCOSE, CAPILLARY
Glucose-Capillary: 126 mg/dL — ABNORMAL HIGH (ref 70–99)
Glucose-Capillary: 209 mg/dL — ABNORMAL HIGH (ref 70–99)
Glucose-Capillary: 231 mg/dL — ABNORMAL HIGH (ref 70–99)

## 2020-02-15 MED ORDER — ASPIRIN 81 MG PO CHEW
81.0000 mg | CHEWABLE_TABLET | Freq: Every day | ORAL | Status: DC
  Administered 2020-02-15: 81 mg via ORAL

## 2020-02-15 NOTE — Plan of Care (Signed)
°  Problem: Education: Goal: Knowledge of General Education information will improve Description: Including pain rating scale, medication(s)/side effects and non-pharmacologic comfort measures Outcome: Adequate for Discharge   Problem: Pain Managment: Goal: General experience of comfort will improve Outcome: Adequate for Discharge   Problem: Safety: Goal: Ability to remain free from injury will improve Outcome: Adequate for Discharge   Problem: Skin Integrity: Goal: Risk for impaired skin integrity will decrease Outcome: Adequate for Discharge   Problem: Education: Goal: Knowledge of the prescribed therapeutic regimen will improve Outcome: Adequate for Discharge Goal: Understanding of discharge needs will improve Outcome: Adequate for Discharge   Problem: Activity: Goal: Ability to avoid complications of mobility impairment will improve Outcome: Adequate for Discharge Goal: Ability to tolerate increased activity will improve Outcome: Adequate for Discharge Goal: Will remain free from falls Outcome: Adequate for Discharge   Problem: Bowel/Gastric: Goal: Gastrointestinal status for postoperative course will improve Outcome: Adequate for Discharge   Problem: Clinical Measurements: Goal: Ability to maintain clinical measurements within normal limits will improve Outcome: Adequate for Discharge Goal: Postoperative complications will be avoided or minimized Outcome: Adequate for Discharge Goal: Diagnostic test results will improve Outcome: Adequate for Discharge   Problem: Pain Management: Goal: Pain level will decrease Outcome: Adequate for Discharge   Problem: Skin Integrity: Goal: Will show signs of wound healing Outcome: Adequate for Discharge   Problem: Health Behavior/Discharge Planning: Goal: Identification of resources available to assist in meeting health care needs will improve Outcome: Adequate for Discharge   Problem: Bladder/Genitourinary: Goal: Urinary  functional status for postoperative course will improve Outcome: Adequate for Discharge

## 2020-02-15 NOTE — Discharge Summary (Signed)
Physician Discharge Summary  Patient ID: Roy [name]. MRN: [id number] DOB/AGE: Jul 28, 1945 74 y.o.  Admit date: 01/29/2020 Discharge date: 02/15/2020  Admission Diagnoses: Cervical myelopathy  Discharge Diagnoses:  Principal Problem:   S/P cervical spinal fusion Active Problems:   Hypertension   Diabetes mellitus without complication (HCC)   HLD (hyperlipidemia)   Chronic diastolic CHF (congestive heart failure) (HCC)   Post-operative state   Discharged Condition: stable  Hospital Course:  POD17: [name] is seen resting in bed this morning. I spoke with [name] last night and it appears insurance has approved him for a bed at [name]  POD 15: Pt seen this morning, reports feeling well.  POD14: [name] seen this morning. No change in assessment. Case management working with patient and awaiting insurance approval for a bed at SNF.  POD13: [name] was evaluated this morning. He is resting comfortably in bed.  He does endorse some L thigh "cramping" but this is tolerable. He was pleased with his progress yesterday with PT.  We continue to await bed placement.  POD11:  Patient laying comfortably in bed with no complaints. POD10:[name] continues to await bed placement. He was seen this morning and denies all complaints. Discussed with Dr. Adriana Simas and we will remove his foley catheter today for a TOV. POD9: [name] continues to await rehab placement but otherwise has no complaints. He continues to work with PT/OT. He is currently recommended for acute rehab and this is in process.  POD8: [name] continues to do well postoperatively, waiting on placement. No complaints this morning.  POD7: Doing well. No complaints.  POD6: [name] was seen this morning, sleepy but arousable. He denies pain, his only complaint is that he didn't sleep well last night.  He is still pending disposition.   POD4: [name] is doing well s/p ACDF. He does feel like his hand  strength has improved and has less pain. He continues to have numbness. He did stand with physical therapy yesterday but had some difficulty with pain in his left knee. He does feel he is improving with them. He has no difficulty swallowing. He is not complaining of any significant neck pain.  Consults: hospital medicine, urology  Significant Diagnostic Studies: 10/20/201:  FINDINGS: Frontal and lateral views were obtained. Patient is status post anterior screw and plate fixation at C3 and C4 with disc spacer at C3-4. Support hardware intact. No fracture or spondylolisthesis. The prevertebral soft tissues and predental space regions are normal. There is moderately severe disc space narrowing at C5-6 and C6-7. Other disc spaces appear unremarkable. Lung apices are clear. There is calcification in each carotid artery.  IMPRESSION: Postoperative anterior screw and plate fixation at C3 and C4 with disc spacer at C3-4. Support hardware intact. Arthropathy noted at C5-6 and C6-7. No fracture or spondylolisthesis. There is calcification in each carotid artery.  [facility] MRI 01/19/2020:   FINDINGS: Alignment: No significant listhesis is present. Straightening of the normal lumbar lordosis is noted.  Vertebrae: Edematous endplate marrow changes are present C7-T1. Chronic changes are present at C5-6 and C6-7. Vertebral body heights are maintained. Marrow signal is otherwise normal.  Cord: Focal T2 signal hyperintensity is present at C3-4. Cord signal and morphology is otherwise normal.  Posterior Fossa, vertebral arteries, paraspinal tissues: Craniocervical junction is normal. Vertebral arteries are patent.  Disc levels:  C2-3: Broad-based disc osteophyte complex present. Uncovertebral spurring and facet disease contribute to moderate right and mild left foraminal stenosis. Central canal  is patent.  C3-4: A broad-based disc osteophyte complex present. Vance  facet hypertrophy is worse on left. This results in severe central canal stenosis. The canal is narrowed to 4 mm. Severe left and moderate right foraminal stenosis is present.  C4-5: A broad-based disc osteophyte complex partially effaces the ventral CSF. Uncovertebral and facet hypertrophy result in moderate right and mild left foraminal stenosis.  C5-6: Uncovertebral and facet disease is worse on the right. Severe right and moderate left foraminal stenosis is present. The central canal is patent.  C6-7: Uncovertebral and facet spurring is present bilaterally. Moderate bilateral foraminal narrowing is present. The central canal is patent.  C7-T1: Uncovertebral spurring is present bilaterally. Mild facet hypertrophy is noted bilaterally. Central canal is patent. Moderate foraminal narrowing is evident bilaterally.  IMPRESSION: 1. 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 The canal is narrowed to 4 mm. Focal T2 cord signal abnormality is present at this level, most consistent with edema. 2. Moderate right and mild left foraminal stenosis at C2-3. 3. Moderate right and mild left foraminal stenosis at C4-5. 4. Severe right and moderate left foraminal stenosis at C5-6. 5. Moderate bilateral foraminal narrowing at C6-7 and C7-T1.    Treatments: ACDF   Discharge Exam: Blood pressure 122/70, pulse 79, temperature 98.3 F (36.8 C), temperature source Oral, resp. rate 18, height 6\' 4"  (1.93 m), weight 115.5 kg, SpO2 94 %. General appearance: alert, cooperative and no distress Head: Normocephalic, without obvious abnormality, atraumatic Eyes: conjunctivae/corneas clear. PERRL, EOM's intact. Fundi benign. Throat: lips, mucosa, and tongue normal; teeth and gums normal Neck: supple, symmetrical, trachea midline Chest wall: no tenderness GI: soft, non distended, non tender Extremities: extremities normal, atraumatic, no cyanosis or edema Neurologic: Grossly normal Incision/Wound:  neck incision clean, dry, intact. No drainage from incision  Disposition: Discharge disposition: 03-Skilled Nursing Facility       Discharge Instructions    Call MD for:   Complete by: As directed    Call MD for:  difficulty breathing, headache or visual disturbances   Complete by: As directed    Call MD for:  extreme fatigue   Complete by: As directed    Call MD for:  hives   Complete by: As directed    Call MD for:  persistant dizziness or light-headedness   Complete by: As directed    Call MD for:  persistant nausea and vomiting   Complete by: As directed    Call MD for:  redness, tenderness, or signs of infection (pain, swelling, redness, odor or green/yellow discharge around incision site)   Complete by: As directed    Call MD for:  severe uncontrolled pain   Complete by: As directed    Call MD for:  temperature >100.4   Complete by: As directed    Diet general   Complete by: As directed    Increase activity slowly   Complete by: As directed    No wound care   Complete by: As directed      Allergies as of 02/15/2020   No Known Allergies     Medication List    STOP taking these medications   meloxicam 7.5 MG tablet Commonly known as: MOBIC     TAKE these medications   amLODipine 5 MG tablet Commonly known as: NORVASC Take 1 tablet (5 mg total) by mouth daily.   aspirin 81 MG chewable tablet Chew 81 mg by mouth daily.   brimonidine 0.2 % ophthalmic solution Commonly known as: ALPHAGAN Place 1  drop into the right eye in the morning and at bedtime.   carvedilol 6.25 MG tablet Commonly known as: COREG Take 6.25 mg by mouth 2 (two) times daily.   CENTRUM SILVER 50+MEN PO Take 1 tablet by mouth daily. Notes to patient: Not given this hospital visit   cyanocobalamin 2000 MCG tablet Take 2,000 mcg by mouth daily. Notes to patient: Not given this hospital visit   famotidine 20 MG tablet Commonly known as: PEPCID Take 20 mg by mouth at bedtime as  needed for heartburn.   insulin aspart 100 UNIT/ML injection Commonly known as: novoLOG Inject 4-8 Units into the skin 3 (three) times daily with meals. Sliding scale   latanoprost 0.005 % ophthalmic solution Commonly known as: XALATAN Place 1 drop into the right eye at bedtime.   Levemir FlexTouch 100 UNIT/ML FlexPen Generic drug: insulin detemir Inject 12 Units into the skin at bedtime.   lisinopril 5 MG tablet Commonly known as: ZESTRIL Take 5 mg by mouth daily. Notes to patient: Not given this hospital visit   melatonin 5 MG Tabs Take 5-10 mg by mouth at bedtime as needed.   metFORMIN 500 MG 24 hr tablet Commonly known as: GLUCOPHAGE-XR Take 500 mg by mouth 2 (two) times daily. Notes to patient: Not given this hospital visit   oxyCODONE 5 MG immediate release tablet Commonly known as: Roxicodone Take 1 tablet (5 mg total) by mouth every 12 (twelve) hours as needed for up to 7 days for severe pain (5mg  every 12 hours for severe pain).   polyethylene glycol powder 17 GM/SCOOP powder Commonly known as: GLYCOLAX/MIRALAX Take 17 g by mouth in the morning.   senna 8.6 MG Tabs tablet Commonly known as: SENOKOT Take 2 tablets (17.2 mg total) by mouth daily as needed for mild constipation.   simvastatin 20 MG tablet Commonly known as: ZOCOR Take 20 mg by mouth daily.   tamsulosin 0.4 MG Caps capsule Commonly known as: FLOMAX Take 1 capsule (0.4 mg total) by mouth daily after supper.   traZODone 50 MG tablet Commonly known as: DESYREL Take 25 mg by mouth at bedtime as needed for sleep.       Contact information for after-discharge care    Destination    [name] Preferred SNF .   Service: Skilled Nursing Contact information: [address] [place] [phone]                #ACDF C3-4 - mobilize - pain control - PTOT, will need rehab placement - Imaging reviewed, good alignment, no complications  #DVT  Prophylaxis - DVT prophylaxis Lovenox 40mg  subq qd while in the hospital - SCDs - Ambulation  ##DM2, CHF, CAD - Continue home medications  - Carb modified diet - May restart ASA 81mg  daily  #Foley for urinary retention - Admitted with foley catheter in place - Failed repeat TOV on 10/29, discussed with urology who recommend replacing foley cathter and keeping in until outpatient follow up with urology (they will schedule)  Pt is cleared for discharge to SNF.   He will follow up with Dr in clinic (appt already scheduled).  , NP Neurosurgery  Signed: 11/29 02/15/2020, 2:01 PM

## 2020-02-15 NOTE — Discharge Summary (Signed)
Procedure: C3-4 ACDF Procedure date: 01/29/2020 Diagnosis: cervical radiculopathy    History: [name]. is s/p C3-4 ACDF POD17: [name] is seen resting in bed this morning. I spoke with [facility] last night and it appears insurance has approved him for a bed at [facility].  POD 15: Pt seen this morning, reports feeling well.  POD14: [name] seen this morning. No change in assessment. Case management working with patient and awaiting insurance approval for a bed at SNF.  POD13: [name] was evaluated this morning. He is resting comfortably in bed.  He does endorse some L thigh "cramping" but this is tolerable. He was pleased with his progress yesterday with PT.  We continue to await bed placement.  POD11:  Patient laying comfortably in bed with no complaints. POD10: [name] continues to await bed placement. He was seen this morning and denies all complaints. Discussed with Dr. Adriana Simas and we will remove his foley catheter today for a TOV.  POD9: [name] continues to await rehab placement but otherwise has no complaints. He continues to work with PT/OT. He is currently recommended for acute rehab and this is in process.  POD8: [name] continues to do well postoperatively, waiting on placement. No complaints this morning.  POD7: Doing well. No complaints.  POD6: [name] was seen this morning, sleepy but arousable. He denies pain, his only complaint is that he didn't sleep well last night.  He is still pending disposition.   POD4: [name] is doing well s/p ACDF.  He does feel like his hand strength has improved and has less pain.  He continues to have numbness.  He did stand with physical therapy yesterday but had some difficulty with pain in his left knee.  He does feel he is improving with them.  He has no difficulty swallowing.  He is not complaining of any significant neck pain.   Physical Exam:     Vitals:   02/07/20 1559 02/08/20 0018  BP: (!) 157/79  122/77  Pulse: 94 88  Resp: 16 18  Temp: 98.3 F (36.8 C) 99.8 F (37.7 C)  SpO2: 95% 97%    General: drowsy, lying in bed Strength:5/5 throughout, effort dependent, decreased range of motion to upper extremities and lower extremities, pt reports "stiffness" Sensation: Intact in bilateral lower extremities, decreased in bilateral hands Skin: Incision C/D/I, neck is soft   Assessment/Plan:  [name]. is POD15 s/p ACDF C3-4.  He is doing well and is safe for discharge from a medical and surgical standpoint. We are awaiting disposition planning from case management.    #ACDF C3-4 - mobilize - pain control - PTOT, will need rehab placement - Imaging reviewed, good alignment, no complications  #DVT Prophylaxis - DVT prophylaxis Lovenox 40mg  subq qd while in the hospital - SCDs - Ambulation  ##DM2, CHF, CAD - Continue home medications  - Carb modified diet - May restart ASA 81mg  daily  #Foley for urinary retention - Admitted with foley catheter in place - Failed repeat TOV on 10/29, discussed with urology who recommend replacing foley cathter and keeping in until outpatient follow up with urology (they will schedule)  Pt is cleared for discharge to SNF.   He will follow up with Dr in clinic (appt already scheduled).  11/29, NP Neurosurgery

## 2020-02-15 NOTE — Discharge Instructions (Signed)
°Your surgeon has performed an operation on your cervical spine (neck) to relieve pressure on the spinal cord and/or nerves. This involved making an incision in the front of your neck and removing one or more of the discs that support your spine. Next, a small piece of bone, a titanium plate, and screws were used to fuse two or more of the vertebrae (bones) together. ° °The following are instructions to help in your recovery once you have been discharged from the hospital. Even if you feel well, it is important that you follow these activity guidelines. If you do not let your neck heal properly from the surgery, you can increase the chance of return of your symptoms and other complications. ° °* Do not take anti-inflammatory medications for 3 months after surgery (naproxen [Aleve], ibuprofen [Advil, Motrin], etc.). These medications can prevent your bones from healing properly. ° °Activity  °  °No bending, lifting, or twisting (“BLT”). Avoid lifting objects heavier than 10 pounds (gallon milk jug).  Where possible, avoid household activities that involve lifting, bending, reaching, pushing, or pulling such as laundry, vacuuming, grocery shopping, and childcare. Try to arrange for help from friends and family for these activities while your back heals. ° °Increase physical activity slowly as tolerated.  Taking short walks is encouraged, but avoid strenuous exercise. Do not jog, run, bicycle, lift weights, or participate in any other exercises unless specifically allowed by your doctor. ° °Talk to your doctor before resuming sexual activity. ° °You should not drive until cleared by your doctor. ° °Until released by your doctor, you should not return to work or school.  You should rest at home and let your body heal.  ° °You may shower three days after your surgery.  After showering, lightly dab your incision dry. Do not take a tub bath or go swimming until approved by your doctor at your follow-up appointment. ° °If  your doctor ordered a cervical collar (neck brace) for you, you should wear it whenever you are out of bed. You may remove it when lying down or sleeping, but you should wear it at all other times. Not all neck surgeries require a cervical collar. ° °If you smoke, we strongly recommend that you quit.  Smoking has been proven to interfere with normal bone healing and will dramatically reduce the success rate of your surgery. Please contact [facility] (800-QUIT-NOW) and use the resources at www.[facility].com for assistance in stopping smoking. ° °Surgical Incision °  °If you have a dressing on your incision, you may remove it two days after your surgery. Keep your incision area clean and dry. ° °If you have staples or stitches on your incision, you should have a follow up scheduled for removal. If you do not have staples or stitches, you will have steri-strips (small pieces of surgical tape) or Dermabond glue. The steri-strips/glue should begin to peel away within about a week (it is fine if the steri-strips fall off before then). If the strips are still in place one week after your surgery, you may gently remove them. ° °Diet          ° °You may return to your usual diet. However, you may experience discomfort when swallowing in the first month after your surgery. This is normal. You may find that softer foods are more comfortable for you to swallow. Be sure to stay hydrated. ° °When to Contact Us ° °You may experience pain in your neck and/or pain between your shoulder blades. This   is normal and should improve in the next few weeks with the help of pain medication, muscle relaxers, and rest. Some patients report that a warm compress on the back of the neck or between the shoulder blades helps. ° °However, should you experience any of the following, contact us immediately: °• New numbness or weakness °• Pain that is progressively getting worse, and is not relieved by your pain medication, muscle relaxers, rest, and  warm compresses °• Bleeding, redness, swelling, pain, or drainage from surgical incision °• Chills or flu-like symptoms °• Fever greater than 101.0 F (38.3 C) °• Inability to eat, drink fluids, or take medications °• Problems with bowel or bladder functions °• Difficulty breathing or shortness of breath °• Warmth, tenderness, or swelling in your calf °Contact Information °• During office hours (Monday-Friday 9 am to 5 pm), please call your physician at [phone] and ask for Kendelyn Jean °• After hours and weekends, please call [phone] and an answering service will put you in touch with either Dr. Cook or Dr. Yarbrough.  °• For a life-threatening emergency, call 911 ° ° °

## 2020-02-22 ENCOUNTER — Encounter: Payer: Self-pay | Admitting: Neurosurgery

## 2020-03-05 ENCOUNTER — Encounter: Payer: Self-pay | Admitting: Urology

## 2020-03-05 ENCOUNTER — Ambulatory Visit (INDEPENDENT_AMBULATORY_CARE_PROVIDER_SITE_OTHER): Payer: Medicare HMO | Admitting: Urology

## 2020-03-05 ENCOUNTER — Other Ambulatory Visit: Payer: Self-pay

## 2020-03-05 ENCOUNTER — Ambulatory Visit: Payer: Self-pay | Admitting: Urology

## 2020-03-05 VITALS — BP 124/76 | HR 82

## 2020-03-05 DIAGNOSIS — N319 Neuromuscular dysfunction of bladder, unspecified: Secondary | ICD-10-CM

## 2020-03-05 DIAGNOSIS — R339 Retention of urine, unspecified: Secondary | ICD-10-CM | POA: Diagnosis not present

## 2020-03-05 NOTE — Patient Instructions (Signed)
Indwelling Urinary Catheter Care, Adult An indwelling urinary catheter is a thin tube that is put into your bladder. The tube helps to drain pee (urine) out of your body. The tube goes in through your urethra. Your urethra is where pee comes out of your body. Your pee will come out through the catheter, then it will go into a bag (drainage bag). Take good care of your catheter so it will work well. How to wear your catheter and bag Supplies needed  Sticky tape (adhesive tape) or a leg strap.  Alcohol wipe or soap and water (if you use tape).  A clean towel (if you use tape).  Large overnight bag.  Smaller bag (leg bag). Wearing your catheter Attach your catheter to your leg with tape or a leg strap.  Make sure the catheter is not pulled tight.  If a leg strap gets wet, take it off and put on a dry strap.  If you use tape to hold the bag on your leg: 1. Use an alcohol wipe or soap and water to wash your skin where the tape made it sticky before. 2. Use a clean towel to pat-dry that skin. 3. Use new tape to make the bag stay on your leg. Wearing your bags You should have been given a large overnight bag.  You may wear the overnight bag in the day or night.  Always have the overnight bag lower than your bladder.  Do not let the bag touch the floor.  Before you go to sleep, put a clean plastic bag in a wastebasket. Then hang the overnight bag inside the wastebasket. You should also have a smaller leg bag that fits under your clothes.  Always wear the leg bag below your knee.  Do not wear your leg bag at night. How to care for your skin and catheter Supplies needed  A clean washcloth.  Water and mild soap.  A clean towel. Caring for your skin and catheter      Clean the skin around your catheter every day: 1. Wash your hands with soap and water. 2. Wet a clean washcloth in warm water and mild soap. 3. Clean the skin around your urethra.  If you are  male:  Gently spread the folds of skin around your vagina (labia).  With the washcloth in your other hand, wipe the inner side of your labia on each side. Wipe from front to back.  If you are male:  Pull back any skin that covers the end of your penis (foreskin).  With the washcloth in your other hand, wipe your penis in small circles. Start wiping at the tip of your penis, then move away from the catheter.  Move the foreskin back in place, if needed. 4. With your free hand, hold the catheter close to where it goes into your body.  Keep holding the catheter during cleaning so it does not get pulled out. 5. With the washcloth in your other hand, clean the catheter.  Only wipe downward on the catheter.  Do not wipe upward toward your body. Doing this may push germs into your urethra and cause infection. 6. Use a clean towel to pat-dry the catheter and the skin around it. Make sure to wipe off all soap. 7. Wash your hands with soap and water.  Shower every day. Do not take baths.  Do not use cream, ointment, or lotion on the area where the catheter goes into your body, unless your doctor tells you   to.  Do not use powders, sprays, or lotions on your genital area.  Check your skin around the catheter every day for signs of infection. Check for: ? Redness, swelling, or pain. ? Fluid or blood. ? Warmth. ? Pus or a bad smell. How to empty the bag Supplies needed  Rubbing alcohol.  Gauze pad or cotton ball.  Tape or a leg strap. Emptying the bag Pour the pee out of your bag when it is ?- full, or at least 2-3 times a day. Do this for your overnight bag and your leg bag. 1. Wash your hands with soap and water. 2. Separate (detach) the bag from your leg. 3. Hold the bag over the toilet or a clean pail. Keep the bag lower than your hips and bladder. This is so the pee (urine) does not go back into the tube. 4. Open the pour spout. It is at the bottom of the bag. 5. Empty the  pee into the toilet or pail. Do not let the pour spout touch any surface. 6. Put rubbing alcohol on a gauze pad or cotton ball. 7. Use the gauze pad or cotton ball to clean the pour spout. 8. Close the pour spout. 9. Attach the bag to your leg with tape or a leg strap. 10. Wash your hands with soap and water. Follow instructions for cleaning the drainage bag:  From the product maker.  As told by your doctor. How to change the bag Supplies needed  Alcohol wipes.  A clean bag.  Tape or a leg strap. Changing the bag Replace your bag when it starts to leak, smell bad, or look dirty. 1. Wash your hands with soap and water. 2. Separate the dirty bag from your leg. 3. Pinch the catheter with your fingers so that pee does not spill out. 4. Separate the catheter tube from the bag tube where these tubes connect (at the connection valve). Do not let the tubes touch any surface. 5. Clean the end of the catheter tube with an alcohol wipe. Use a different alcohol wipe to clean the end of the bag tube. 6. Connect the catheter tube to the tube of the clean bag. 7. Attach the clean bag to your leg with tape or a leg strap. Do not make the bag tight on your leg. 8. Wash your hands with soap and water. General rules   Never pull on your catheter. Never try to take it out. Doing that can hurt you.  Always wash your hands before and after you touch your catheter or bag. Use a mild, fragrance-free soap. If you do not have soap and water, use hand sanitizer.  Always make sure there are no twists or bends (kinks) in the catheter tube.  Always make sure there are no leaks in the catheter or bag.  Drink enough fluid to keep your pee pale yellow.  Do not take baths, swim, or use a hot tub.  If you are male, wipe from front to back after you poop (have a bowel movement). Contact a doctor if:  Your pee is cloudy.  Your pee smells worse than usual.  Your catheter gets clogged.  Your catheter  leaks.  Your bladder feels full. Get help right away if:  You have redness, swelling, or pain where the catheter goes into your body.  You have fluid, blood, pus, or a bad smell coming from the area where the catheter goes into your body.  Your skin feels warm where   the catheter goes into your body.  You have a fever.  You have pain in your: ? Belly (abdomen). ? Legs. ? Lower back. ? Bladder.  You see blood in the catheter.  Your pee is pink or red.  You feel sick to your stomach (nauseous).  You throw up (vomit).  You have chills.  Your pee is not draining into the bag.  Your catheter gets pulled out. Summary  An indwelling urinary catheter is a thin tube that is placed into the bladder to help drain pee (urine) out of the body.  The catheter is placed into the part of the body that drains pee from the bladder (urethra).  Taking good care of your catheter will keep it working properly and help prevent problems.  Always wash your hands before and after touching your catheter or bag.  Never pull on your catheter or try to take it out. This information is not intended to replace advice given to you by your health care provider. Make sure you discuss any questions you have with your health care provider. Document Revised: 07/21/2018 Document Reviewed: 11/12/2016 Elsevier Patient Education  2020 [employer]  

## 2020-03-05 NOTE — Progress Notes (Signed)
03/05/2020 9:54 AM   [name] 29-Mar-1946 [id number]  Referring provider: Rigoberto Noel, MD [address] [place],  [place] [address]  Chief Complaint  Patient presents with  . Urinary Retention    HPI:  Mr. Minchey seen today for urinary retention. He had progressive weakness and difficulty ambulating. He was admitted to hospital and went into retention. A foley was placed for "1500 cc". He had cervical and lumbar spinal stenosis and underwent spinal fusion of his C-spine on 01/29/2020. After surgery he has not been ambulatory. He can't stand or use his arms very well. He can't feed himself. He underwent a voiding trial and failed. He had the catheter replaced. He continues to have very limited movement of his arms and legs and is not ambulatory.  He is on tamsulosin. He denies any prior urologic history or difficulty voiding. He does not have constipation.   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    PMH: Past Medical History:  Diagnosis Date  . CHF (congestive heart failure) (HCC)   . Coronary artery disease   . Diabetes mellitus without complication (HCC)   . Glaucoma   . HLD (hyperlipidemia)   . Hypertension   . Non-ischemic cardiomyopathy [facility])     Surgical History: Past Surgical History:  Procedure Laterality Date  . ANTERIOR CERVICAL DECOMP/DISCECTOMY FUSION N/A 01/29/2020   Procedure: ANTERIOR CERVICAL DECOMPRESSION/DISCECTOMY FUSION 1 LEVEL C3/4;  Surgeon: [name], MD;  Location: [facility] ORS;  Service: Neurosurgery;  Laterality: N/A;  . arm surgery Right    4x surgery as a child, cut arm falling through glass window   . HYDROCELE EXCISION / REPAIR    . REPLACEMENT TOTAL KNEE Right     Home Medications:  Allergies as of 03/05/2020   No Known Allergies     Medication List       Accurate as of March 05, 2020  9:54 AM. If you have any questions, ask your nurse or doctor.        amLODipine 5 MG tablet Commonly known as: NORVASC Take 1  tablet (5 mg total) by mouth daily.   aspirin 81 MG chewable tablet Chew 81 mg by mouth daily.   brimonidine 0.2 % ophthalmic solution Commonly known as: ALPHAGAN Place 1 drop into the right eye in the morning and at bedtime.   carvedilol 6.25 MG tablet Commonly known as: COREG Take 6.25 mg by mouth 2 (two) times daily.   CENTRUM SILVER 50+MEN PO Take 1 tablet by mouth daily.   cyanocobalamin 2000 MCG tablet Take 2,000 mcg by mouth daily.   famotidine 20 MG tablet Commonly known as: PEPCID Take 20 mg by mouth at bedtime as needed for heartburn.   insulin aspart 100 UNIT/ML injection Commonly known as: novoLOG Inject 4-8 Units into the skin 3 (three) times daily with meals. Sliding scale   latanoprost 0.005 % ophthalmic solution Commonly known as: XALATAN Place 1 drop into the right eye at bedtime.   Levemir FlexTouch 100 UNIT/ML FlexPen Generic drug: insulin detemir Inject 12 Units into the skin at bedtime.   lisinopril 5 MG tablet Commonly known as: ZESTRIL Take 5 mg by mouth daily.   melatonin 5 MG Tabs Take 5-10 mg by mouth at bedtime as needed.   metFORMIN 500 MG 24 hr tablet Commonly known as: GLUCOPHAGE-XR Take 500 mg by mouth 2 (two) times daily.   polyethylene glycol powder 17 GM/SCOOP powder Commonly known as: GLYCOLAX/MIRALAX Take 17 g by mouth in  the morning.   senna 8.6 MG Tabs tablet Commonly known as: SENOKOT Take 2 tablets (17.2 mg total) by mouth daily as needed for mild constipation.   simvastatin 20 MG tablet Commonly known as: ZOCOR Take 20 mg by mouth daily.   tamsulosin 0.4 MG Caps capsule Commonly known as: FLOMAX Take 1 capsule (0.4 mg total) by mouth daily after supper.   traZODone 50 MG tablet Commonly known as: DESYREL Take 25 mg by mouth at bedtime as needed for sleep.       Allergies: No Known Allergies  Family History: No family history on file.  Social History:  reports that he has never smoked. He has never used  smokeless tobacco. He reports that he does not drink alcohol. No history on file for drug use.   Physical Exam: There were no vitals taken for this visit.  Constitutional:  Alert and oriented, No acute distress. HEENT: [place] AT, moist mucus membranes.  Trachea midline, no masses. Cardiovascular: No clubbing, cyanosis, or edema. Respiratory: Normal respiratory effort, no increased work of breathing. GI: Abdomen is soft, nontender, nondistended, no abdominal masses GU: No CVA tenderness Lymph: No inguinal lymphadenopathy. Skin: No rashes, bruises or suspicious lesions. Neurologic: Grossly intact, no focal deficits, moving all 4 extremities. Psychiatric: Normal mood and affect. GU: Foley in place. Urine clear.  Laboratory Data: Lab Results  Component Value Date   WBC 7.8 01/25/2020   HGB 13.4 01/25/2020   HCT 40.4 01/25/2020   MCV 94.8 01/25/2020   PLT 309 01/25/2020    Lab Results  Component Value Date   CREATININE 0.92 01/22/2020    No results found for: PSA  No results found for: TESTOSTERONE  Lab Results  Component Value Date   HGBA1C 5.8 (H) 01/16/2020    Urinalysis    Component Value Date/Time   COLORURINE YELLOW (A) 01/14/2020 1139   APPEARANCEUR HAZY (A) 01/14/2020 1139   LABSPEC 1.017 01/14/2020 1139   PHURINE 5.0 01/14/2020 1139   GLUCOSEU NEGATIVE 01/14/2020 1139   HGBUR NEGATIVE 01/14/2020 1139   BILIRUBINUR NEGATIVE 01/14/2020 1139   KETONESUR 20 (A) 01/14/2020 1139   PROTEINUR 100 (A) 01/14/2020 1139   NITRITE NEGATIVE 01/14/2020 1139   LEUKOCYTESUR SMALL (A) 01/14/2020 1139    Lab Results  Component Value Date   BACTERIA NONE SEEN 01/14/2020    Pertinent Imaging: n/a Results for orders placed during the hospital encounter of 01/14/20  DG Abd 1 View  Narrative CLINICAL DATA:  Right lower quadrant pain  EXAM: ABDOMEN - 1 VIEW  COMPARISON:  None.  FINDINGS: There is nonspecific gaseous distention throughout the majority of the colon.  There is a large amount of stool in the right hemicolon. There is no pneumatosis or free air. No evidence for small bowel obstruction. Advanced multilevel degenerative changes are noted throughout the lumbar spine. There are degenerative changes of both hips. No convincing radiopaque kidney stone.  IMPRESSION: Nonspecific gaseous distention throughout the majority of the colon. Large amount of stool in the right hemicolon. No evidence for small bowel obstruction.   Electronically Signed By: Katherine Mantle M.D. On: 01/20/2020 19:32  No results found for this or any previous visit.  No results found for this or any previous visit.  No results found for this or any previous visit.  No results found for this or any previous visit.  No results found for this or any previous visit.  No results found for this or any previous visit.  No results found  for this or any previous visit.   Assessment & Plan:    Urinary retention-likely related to a neurogenic component, but could also be related to BPH, immobility etc. I think for now the best thing would be to continue the Foley and change it monthly. If he regained some function of his arms and legs over time, that would be a good time for a voiding trial or possibly in another month or two. His wife was a Armed forces technical officer. We went over the nature r/b/a to foley catheter. They already have orders for monthly cath change.   Neurogenic bladder-will continue foley catheter and consider void trial in future as above.   F/u in 12 weeks. Pt is moving back home and will have a new routine there.   No follow-ups on file.  Jerilee Field, MD  [facility] [address] [place], [place] [address] [phone]

## 2020-03-13 ENCOUNTER — Other Ambulatory Visit: Payer: Self-pay | Admitting: Neurosurgery

## 2020-03-13 DIAGNOSIS — Z981 Arthrodesis status: Secondary | ICD-10-CM

## 2020-03-27 ENCOUNTER — Ambulatory Visit
Admission: RE | Admit: 2020-03-27 | Discharge: 2020-03-27 | Disposition: A | Discharge status: Home / Self Care | Payer: Medicare HMO | Source: Ambulatory Visit | Attending: Neurosurgery | Admitting: Neurosurgery

## 2020-03-27 ENCOUNTER — Other Ambulatory Visit: Payer: Self-pay

## 2020-03-27 DIAGNOSIS — Z981 Arthrodesis status: Secondary | ICD-10-CM | POA: Insufficient documentation

## 2020-03-27 IMAGING — MR MR CERVICAL SPINE W/O CM
5 series · 38 of 48 positions shown · non-contrast
Comparison: [DATE] and prior.

CLINICAL DATA: Bilateral arm weakness.

EXAM:
MRI CERVICAL SPINE WITHOUT CONTRAST
TECHNIQUE: Multiplanar, multisequence MR imaging of the cervical spine was
performed. No intravenous contrast was administered.

[Series 5: T2 · sagittal · 3.0mm · 0.62mm/px · 6 of 15 slices shown (1 of 2)]
[im 1/15]
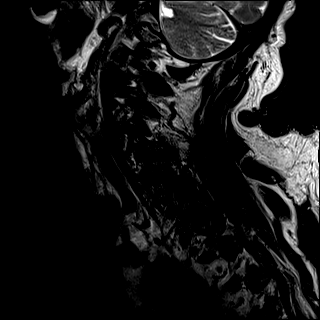
[im 3/15]
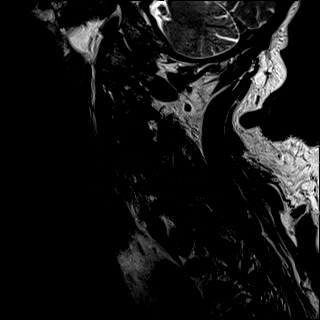
[im 6/15]
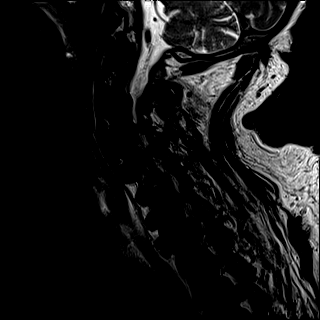
[im 9/15]
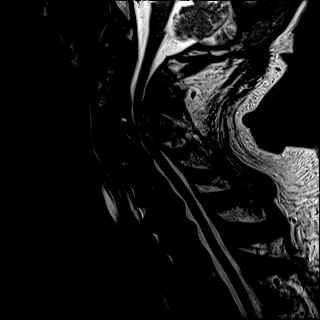
[im 12/15]
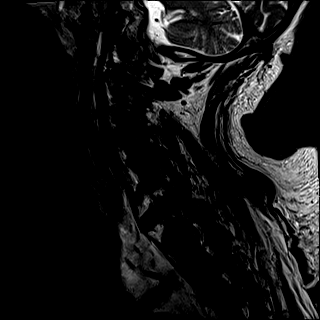
[im 15/15]
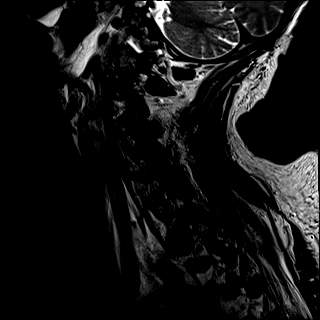

[Series 6: FLAIR · sagittal · 3.0mm · 0.78mm/px · 7 of 15 slices shown]
[im 1/15]
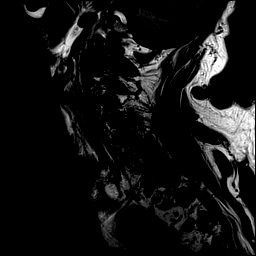
[im 3/15]
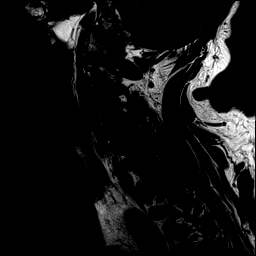
[im 5/15]
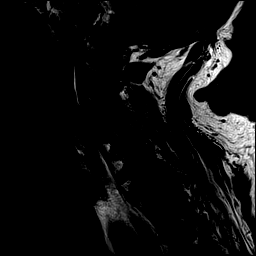
[im 8/15]
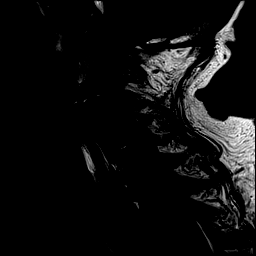
[im 10/15]
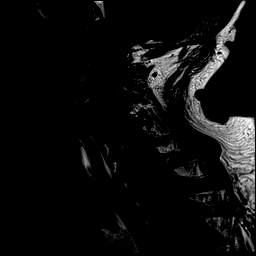
[im 12/15]
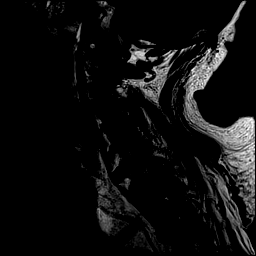
[im 15/15]
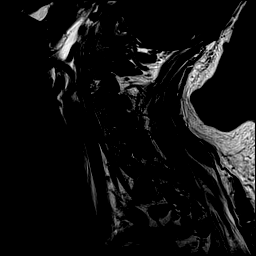

[Series 7: STIR · sagittal · 3.0mm · 0.62mm/px · 7 of 15 slices shown]
[im 1/15]
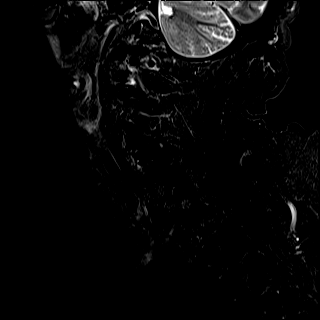
[im 3/15]
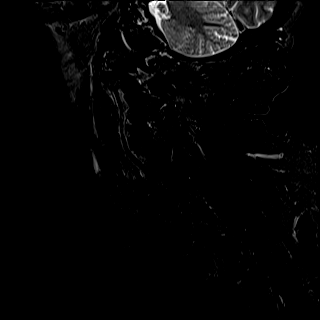
[im 5/15]
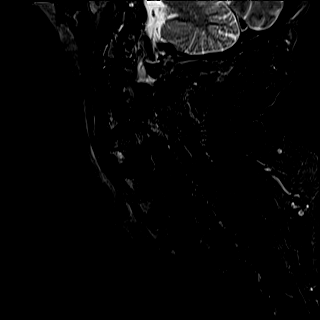
[im 8/15]
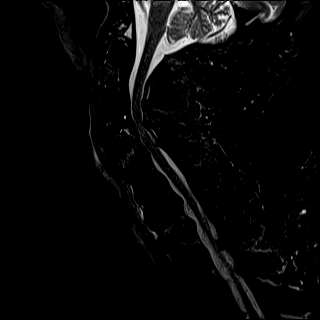
[im 10/15]
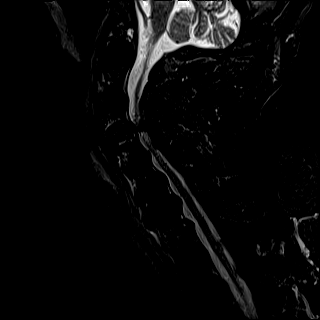
[im 12/15]
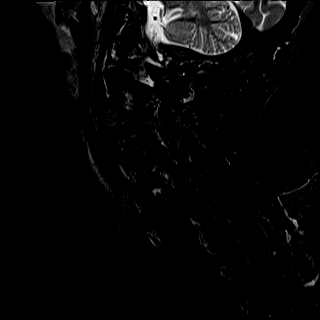
[im 15/15]
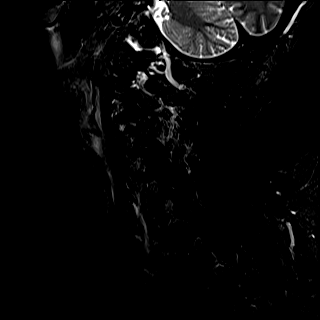

[Series 8: T2 · axial · 3.0mm · 0.70mm/px · z∈[-125,-24]mm · 10 of 31 slices shown (2 of 2)]
[im 1/31]
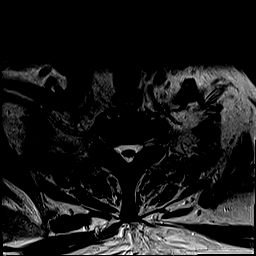
[im 3/31]
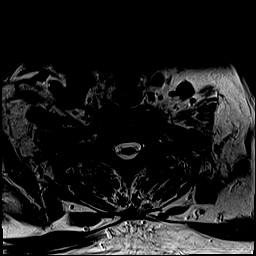
[im 5/31]
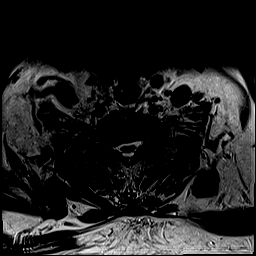
[im 7/31]
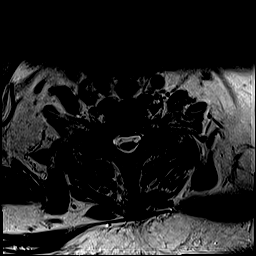
[im 10/31]
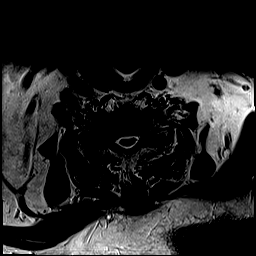
[im 14/31]
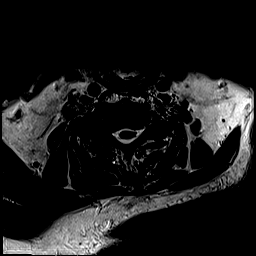
[im 17/31]
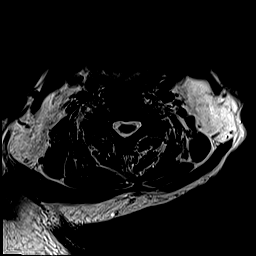
[im 21/31]
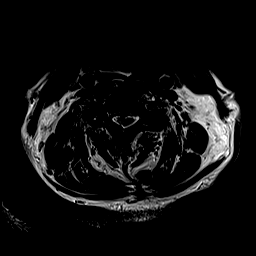
[im 26/31]
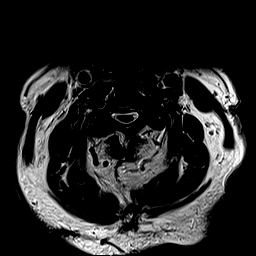
[im 31/31]
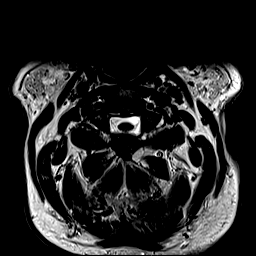

[Series 9: ax mpgr · axial · 3.0mm · 0.35mm/px · z∈[-125,-24]mm · 8 of 31 slices shown]
[im 1/31]
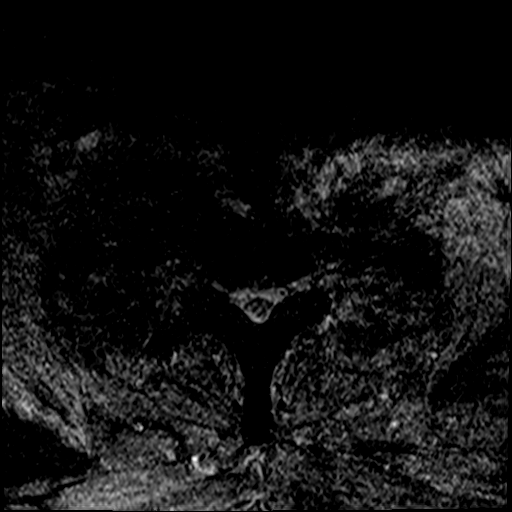
[im 5/31]
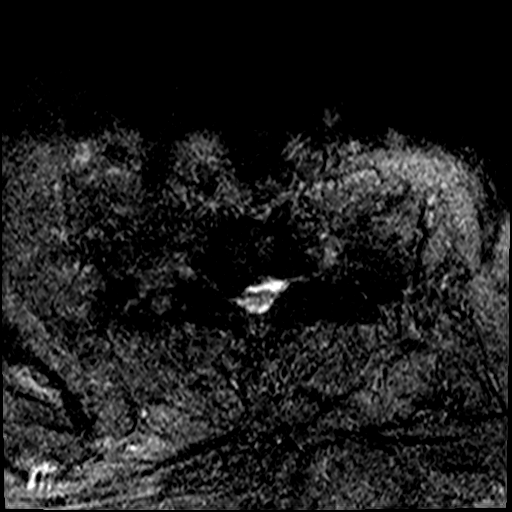
[im 10/31]
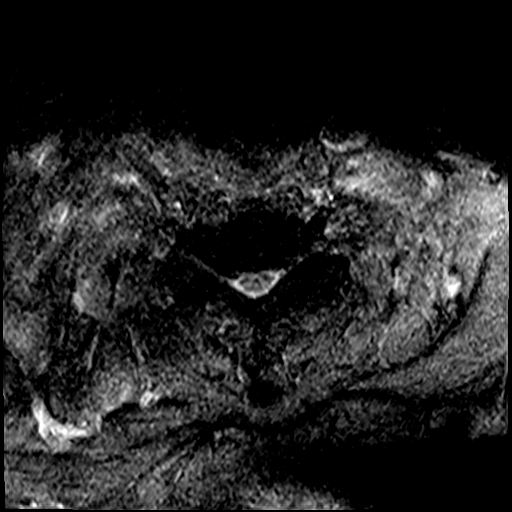
[im 14/31]
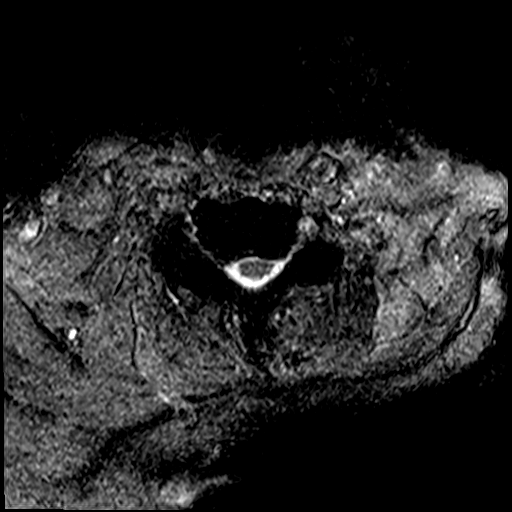
[im 17/31]
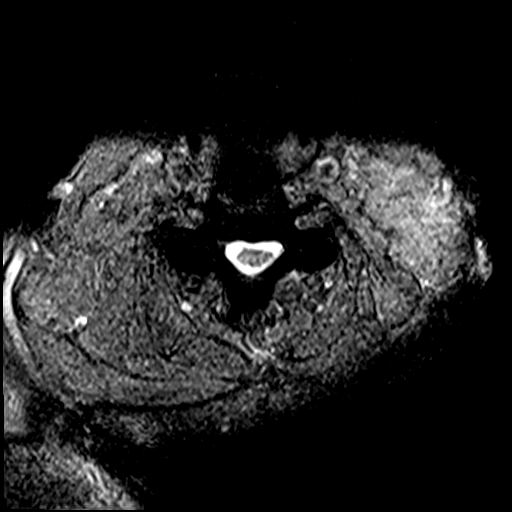
[im 21/31]
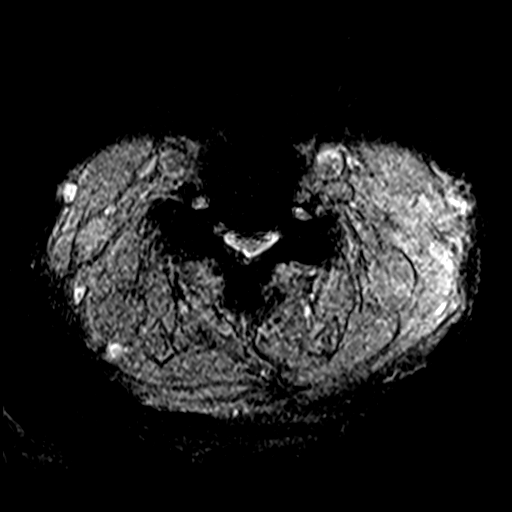
[im 26/31]
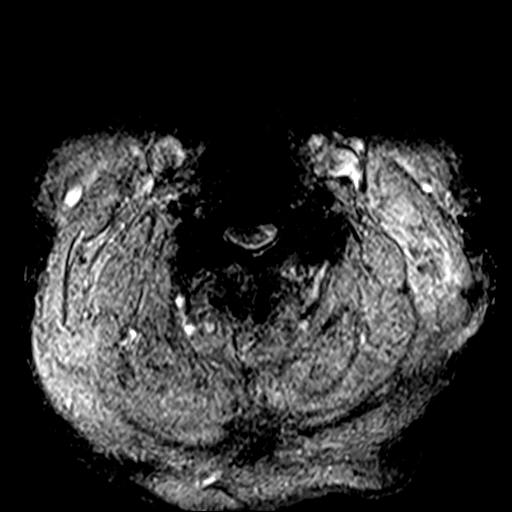
[im 31/31]
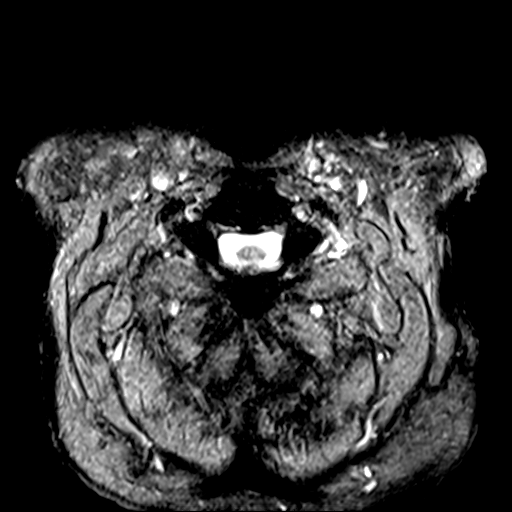

[38 of 48 positions shown; findings below may reference images not displayed]

FINDINGS: Alignment: Straightening of lordosis. Sequela of C3-4 ACDF.
Associated susceptibility artifact limits evaluation.

Vertebrae: Vertebral body heights are preserved. Multilevel Modic
type 2 endplate degenerative changes. No aggressive osseous lesion.

Cord: Increased conspicuity of C3-4 myelomalacia.

Posterior Fossa, vertebral arteries: Negative.

Disc levels: Multilevel desiccation.

C2-3: Disc osteophyte complex with uncovertebral and facet
hypertrophy. Mild spinal canal, moderate right and mild left neural
foraminal narrowing.

C3-4: Sequela of ACDF. Moderate to severe spinal canal narrowing
measuring 5 mm in AP dimension, improved from prior exam. Severe
left and moderate right neural foraminal narrowing, decreased from
prior exam.

C4-5: Disc osteophyte complex with uncovertebral and facet
hypertrophy. Prominent ligamentum flavum. Mild spinal canal,
moderate right and mild left neural foraminal narrowing, unchanged.

C5-6: Disc osteophyte complex with right predominant uncovertebral
and facet hypertrophy. Mild spinal canal, severe right and moderate
left neural foraminal narrowing, unchanged.

C6-7: Disc osteophyte complex with uncovertebral and facet
hypertrophy. Superimposed central protrusion and prominent
ligamentum flavum. Mild spinal canal, moderate right greater than
left neural foraminal narrowing, unchanged.

C7-T1: Uncovertebral degenerative spurring with superimposed right
subarticular protrusion and bilateral facet hypertrophy. Mild spinal
canal, moderate right greater than left neural foraminal narrowing,
unchanged.

Paraspinal tissues: Negative.
IMPRESSION: Sequela of C3-4 ACDF. Moderate to severe spinal canal and neural
foraminal narrowing at this level is improved since prior exam.

Increased conspicuity of C3-4 myelomalacia.

Advanced multilevel spondylosis, otherwise unchanged at the
remaining cervical levels.

## 2020-04-22 ENCOUNTER — Other Ambulatory Visit: Payer: Self-pay | Admitting: Neurosurgery

## 2020-04-29 ENCOUNTER — Other Ambulatory Visit: Payer: Medicare HMO

## 2020-04-30 ENCOUNTER — Other Ambulatory Visit: Payer: Medicare HMO

## 2020-05-01 ENCOUNTER — Encounter
Admission: RE | Admit: 2020-05-01 | Discharge: 2020-05-01 | Disposition: A | Discharge status: Home / Self Care | Payer: Medicare HMO | Source: Ambulatory Visit | Attending: Neurosurgery | Admitting: Neurosurgery

## 2020-05-01 ENCOUNTER — Other Ambulatory Visit: Payer: Self-pay

## 2020-05-01 DIAGNOSIS — A498 Other bacterial infections of unspecified site: Secondary | ICD-10-CM | POA: Insufficient documentation

## 2020-05-01 DIAGNOSIS — Z20822 Contact with and (suspected) exposure to covid-19: Secondary | ICD-10-CM | POA: Insufficient documentation

## 2020-05-01 DIAGNOSIS — Z01812 Encounter for preprocedural laboratory examination: Secondary | ICD-10-CM | POA: Insufficient documentation

## 2020-05-01 HISTORY — DX: Ventricular premature depolarization: I49.3

## 2020-05-01 HISTORY — DX: Bed confinement status: Z74.01

## 2020-05-01 HISTORY — DX: Gastro-esophageal reflux disease without esophagitis: K21.9

## 2020-05-01 HISTORY — DX: Pain in right shoulder: M25.511

## 2020-05-01 HISTORY — DX: Sleep apnea, unspecified: G47.30

## 2020-05-01 LAB — TYPE AND SCREEN
ABO/RH(D): A POS
Antibody Screen: NEGATIVE

## 2020-05-01 LAB — CBC
HCT: 44 % (ref 39.0–52.0)
Hemoglobin: 14 g/dL (ref 13.0–17.0)
MCH: 30.1 pg (ref 26.0–34.0)
MCHC: 31.8 g/dL (ref 30.0–36.0)
MCV: 94.6 fL (ref 80.0–100.0)
Platelets: 232 10*3/uL (ref 150–400)
RBC: 4.65 MIL/uL (ref 4.22–5.81)
RDW: 14.5 % (ref 11.5–15.5)
WBC: 7.2 10*3/uL (ref 4.0–10.5)
nRBC: 0 % (ref 0.0–0.2)

## 2020-05-01 LAB — BASIC METABOLIC PANEL
Anion gap: 9 (ref 5–15)
BUN: 25 mg/dL — ABNORMAL HIGH (ref 8–23)
CO2: 29 mmol/L (ref 22–32)
Calcium: 10 mg/dL (ref 8.9–10.3)
Chloride: 104 mmol/L (ref 98–111)
Creatinine, Ser: 0.79 mg/dL (ref 0.61–1.24)
GFR, Estimated: >60 mL/min (ref >60)
Glucose, Bld: 100 mg/dL — ABNORMAL HIGH (ref 70–99)
Potassium: 4.1 mmol/L (ref 3.5–5.1)
Sodium: 142 mmol/L (ref 135–145)

## 2020-05-01 LAB — URINALYSIS, ROUTINE W REFLEX MICROSCOPIC
Bilirubin Urine: NEGATIVE
Glucose, UA: NEGATIVE mg/dL
Ketones, ur: NEGATIVE mg/dL
Nitrite: POSITIVE — AB
Protein, ur: 100 mg/dL — AB
Specific Gravity, Urine: 1.017 (ref 1.005–1.030)
WBC, UA: >50 WBC/hpf — ABNORMAL HIGH (ref 0–5)
pH: 5 (ref 5.0–8.0)

## 2020-05-01 LAB — APTT: aPTT: 31 seconds (ref 24–36)

## 2020-05-01 LAB — PROTIME-INR
INR: 1 (ref 0.8–1.2)
Prothrombin Time: 13.1 seconds (ref 11.4–15.2)

## 2020-05-01 LAB — SURGICAL PCR SCREEN
MRSA, PCR: NEGATIVE
Staphylococcus aureus: NEGATIVE

## 2020-05-01 NOTE — Progress Notes (Signed)
  [facility] Perioperative Services: Pre-Admission/Anesthesia Testing  Abnormal Lab Notification   Date: 05/01/20  Name: Roy [name]. MRN:   [id number]  Re: Abnormal labs noted during PAT appointment   Provider(s) Notified: [name], MD Notification mode: Routed and/or faxed via [facility]   ABNORMAL LAB VALUE(S): Lab Results  Component Value Date   COLORURINE YELLOW (A) 05/01/2020   APPEARANCEUR CLOUDY (A) 05/01/2020   LABSPEC 1.017 05/01/2020   PHURINE 5.0 05/01/2020   GLUCOSEU NEGATIVE 05/01/2020   HGBUR SMALL (A) 05/01/2020   BILIRUBINUR NEGATIVE 05/01/2020   KETONESUR NEGATIVE 05/01/2020   PROTEINUR 100 (A) 05/01/2020   NITRITE POSITIVE (A) 05/01/2020   LEUKOCYTESUR LARGE (A) 05/01/2020   EPIU 0-5 05/01/2020   WBCU >50 (H) 05/01/2020   RBCU 6-10 05/01/2020   BACTERIA MANY (A) 05/01/2020    Notes:  Patient scheduled for C3-4 LAMINECTOMY & C3-4 FUSION (N/A ) on 05/05/2020.   UA performed in PAT consistent with/concerning for infection.  . No leukocytosis noted on CBC . Renal function WNL; BUN 25 and creatinine 0.79 mg/dL . Urine C&S added to assess for pathogenically significant growth.  Plan: o Will treat with a 5 day course of ciprofloxacin. Patient encouraged to complete the entire course of antibiotics even if he begins to feel better. He was advised that if culture demonstrates resistance to the prescribed antibiotic, he will be contacted and advised of the need to change the antibiotic being used to treat his infection.   o Patient encouraged to increase his fluid intake as much as possible. Discussed that water is always best to flush the urinary tract. He was advised to avoid caffeine containing fluids until his infections clears, as caffeine can cause him to experience painful bladder spasms.   o May use Tylenol as needed for pain/fever.   Quentin Mulling, MSN, APRN, FNP-C, CEN [facility]  Peri-operative Services  Nurse Practitioner Phone: [phone] Fax: [phone] 05/01/20 4:04 PM

## 2020-05-01 NOTE — Patient Instructions (Addendum)
Your procedure is scheduled on:05-05-20 MONDAY Report to the Registration Desk on the 1st floor of the Medical Mall-then proceed to the 2nd floor Surgery Desk in the Medical Mall To find out your arrival time, please call [phone] between 1PM - 3PM on:05-02-20 FRIDAY  REMEMBER: Instructions that are not followed completely may result in serious medical risk, up to and including death; or upon the discretion of your surgeon and anesthesiologist your surgery may need to be rescheduled.  Do not eat food after midnight the night before surgery.  No gum chewing, lozengers or hard candies.  You may however, drink WATER up to 2 hours before you are scheduled to arrive for your surgery. Do not drink anything within 2 hours of your scheduled arrival time.  Type 1 and Type 2 diabetics should only drink water.  TAKE THESE MEDICATIONS THE MORNING OF SURGERY WITH A SIP OF WATER: -COREG (CARVEDILOL) -BACLOFEN (LIORESAL) -PEPCID (FAMOTIDINE)  Stop Metformin 2 days prior to surgery-LAST DOSE ON 05-02-20 FRIDAY  Take 1/2 of usual insulin dose the night before surgery and none on the morning of surgery-TAKE HALF OF YOUR LEVEMIR SUNDAY NIGHT 6 UNITS AND NO INSULIN THE MORNING OF SURGERY  Follow recommendations from Cardiologist, Pulmonologist or PCP regarding stopping Aspirin, Coumadin, Plavix, Eliquis, Pradaxa, or Pletal-LAST DOSE OF ASPIRIN WAS ON 04-26-20 PER DR COOKS INSTRUCTIONS  One week prior to surgery: Stop Anti-inflammatories (NSAIDS) such as MOBIC (MELOXICAM), Advil, Aleve, Ibuprofen, Motrin, Naproxen, Naprosyn and Aspirin based products such as Excedrin, Goodys Powder, BC Powder-OK TO TAKE TYLENOL IF NEEDED  Stop ANY OVER THE COUNTER supplements until after surgery. (However, you may continue taking Vitamin B12 and multivitamin up until the day before surgery.)  No Alcohol for 24 hours before or after surgery.  No Smoking including e-cigarettes for 24 hours prior to surgery.  No chewable  tobacco products for at least 6 hours prior to surgery.  No nicotine patches on the day of surgery.  Do not use any "recreational" drugs for at least a week prior to your surgery.  Please be advised that the combination of cocaine and anesthesia may have negative outcomes, up to and including death. If you test positive for cocaine, your surgery will be cancelled.  On the morning of surgery brush your teeth with toothpaste and water, you may rinse your mouth with mouthwash if you wish. Do not swallow any toothpaste or mouthwash.  Do not wear jewelry, make-up, hairpins, clips or nail polish.  Do not wear lotions, powders, or perfumes.   Do not shave body from the neck down 48 hours prior to surgery just in case you cut yourself which could leave a site for infection.  Also, freshly shaved skin may become irritated if using the CHG soap.  Contact lenses, hearing aids and dentures may not be worn into surgery.  Do not bring valuables to the hospital. [facility] is not responsible for any missing/lost belongings or valuables.   Use CHG Soap as directed on instruction sheet..   Notify your doctor if there is any change in your medical condition (cold, fever, infection).  Wear comfortable clothing (specific to your surgery type) to the hospital.  Plan for stool softeners for home use; pain medications have a tendency to cause constipation. You can also help prevent constipation by eating foods high in fiber such as fruits and vegetables and drinking plenty of fluids as your diet allows.  After surgery, you can help prevent lung complications by doing breathing exercises.  Take deep breaths and cough every 1-2 hours. Your doctor may order a device called an Incentive Spirometer to help you take deep breaths. When coughing or sneezing, hold a pillow firmly against your incision with both hands. This is called "splinting." Doing this helps protect your incision. It also decreases belly  discomfort.  If you are being admitted to the hospital overnight, leave your suitcase in the car. After surgery it may be brought to your room.  If you are being discharged the day of surgery, you will not be allowed to drive home. You will need a responsible adult (18 years or older) to drive you home and stay with you that night.   If you are taking public transportation, you will need to have a responsible adult (18 years or older) with you. Please confirm with your physician that it is acceptable to use public transportation.   Please call the Pre-admissions Testing Dept. at [phone] if you have any questions about these instructions.  Visitation Policy:  Patients undergoing a surgery or procedure may have one family member or support person with them as long as that person is not COVID-19 positive or experiencing its symptoms.  That person may remain in the waiting area during the procedure.  Inpatient Visitation:    Visiting hours are 7 a.m. to 8 p.m. Patients will be allowed one visitor. The visitor may change daily. The visitor must pass COVID-19 screenings, use hand sanitizer when entering and exiting the patient's room and wear a mask at all times, including in the patient's room. Patients must also wear a mask when staff or their visitor are in the room. Masking is required regardless of vaccination status. Systemwide, no visitors 17 or younger.

## 2020-05-02 ENCOUNTER — Encounter: Payer: Self-pay | Admitting: Neurosurgery

## 2020-05-02 ENCOUNTER — Inpatient Hospital Stay: Admission: RE | Admit: 2020-05-02 | Payer: Medicare HMO | Source: Ambulatory Visit

## 2020-05-02 ENCOUNTER — Other Ambulatory Visit: Payer: Medicare HMO

## 2020-05-02 ENCOUNTER — Telehealth: Payer: Self-pay | Admitting: *Deleted

## 2020-05-02 LAB — SARS CORONAVIRUS 2 (TAT 6-24 HRS): SARS Coronavirus 2: NEGATIVE

## 2020-05-02 NOTE — Telephone Encounter (Signed)
Request for pre-operative cardiac clearance Received: Today Roy Kitchens, NP  P [facility] Request for pre-operative cardiac clearance:    1. What type of surgery is being performed?  C3-4 LAMINECTOMY & C3-4 FUSION   2. When is this surgery scheduled?  05/05/2020    3. Are there any medications that need to be held prior to surgery?  ASA   4. Practice name and name of physician performing surgery?  Performing surgeon: Dr. Deetta Perla, MD  Requesting clearance: Honor Loh, FNP-C     5. Anesthesia type (none, local, MAC, general)? General   6. What is the office phone and fax number?   Phone: [phone]  Fax: [phone]   NOTE: Patient cleared for surgery back in 01/2020. Just wanting to ensure updated clearance in place and that we are safe to proceed from a CV perspective.   ATTENTION: Unable to create telephone message as per your standard workflow. Directed by [facility] providers to send requests for cardiac clearance to this pool for appropriate distribution to provider covering pre-operative clearances.   Honor Loh, MSN, APRN, FNP-C, CEN  [facility]  Peri-operative Services Nurse Practitioner  Phone: [phone]  05/02/20 1:49 PM

## 2020-05-02 NOTE — Telephone Encounter (Signed)
   Primary Cardiologist: Debbe Odea, MD  Chart reviewed as part of pre-operative protocol coverage. Given past medical history and time since last visit, based on ACC/AHA guidelines, Tamari Busic. would be at acceptable risk for the planned procedure without further cardiovascular testing.   His aspirin may be held for 5-7 days prior to his procedure.  Please resume as soon as hemostasis is achieved.  Patient was advised that if he develops new symptoms prior to surgery to contact our office to arrange a follow-up appointment.  He verbalized understanding.  I will route this recommendation to the requesting party via Epic fax function and remove from pre-op pool.  Please call with questions.  Thomasene Ripple. Gabriana Wilmott NP-C    05/02/2020, 2:30 PM [facility] [address] Office [phone] Fax [phone]

## 2020-05-02 NOTE — Progress Notes (Signed)
[facility] Perioperative Services  Pre-Admission/Anesthesia Testing Clinical Review  Date: 05/02/20  Patient Demographics:  Name: Roy [name]. DOB:   10-08-1945 MRN:   [id number]  Planned Surgical Procedure(s):    Case: [id number] Date/Time: 05/05/20 1230   Procedure: C3-4 LAMINECTOMY & C3-4 FUSION (N/A )   Anesthesia type: General   Pre-op diagnosis: cervical myelopathy   Location: [facility] OR ROOM 03 / [facility] ORS FOR ANESTHESIA GROUP   Surgeons: [name], MD    NOTE: Available PAT nursing documentation and vital signs have been reviewed. Clinical nursing staff has updated patient's PMH/PSHx, current medication list, and drug allergies/intolerances to ensure comprehensive history available to assist in medical decision making as it pertains to the aforementioned surgical procedure and anticipated anesthetic course.   Clinical Discussion:  Roy Weckerly. is a 75 y.o. male who is submitted for pre-surgical anesthesia review and clearance prior to him undergoing the above procedure. Patient has never been a smoker. Pertinent PMH includes: CAD, CHF, NICM, frequent PVCs, OSAH (does not use nocturnal PAP therapy), HTN, HLD, T2DM, glaucoma, OA, lumbar stenosis, chronic indwelling foley catheter, bed bound status due to progressive debility.  Patient is followed by cardiology Christell Constant, MD) at [facility].  He was last seen by his primary cardiologist in clinic on 10/18/2019.  Of note, patient was seen in preoperative consult by [facility] in [place] for preoperative risk ratification prior to a similar procedure back in 01/2020.  Patient denied any significant chest pain, shortness of breath, PND, orthopnea, palpitations, or presyncope/syncope.  Lower extremity neuropathy limiting mobility.  Patient has a history of nonischemic cardiomyopathy.  TTE in 07/2007 revealed a severe LV dilation with severe global hypokinesis and LVEF 25-30%. Subsequent cardiac  catheterization revealed nonobstructive CAD, with the most significant lesion being about 30%.  Repeat TTE in 11/2014 revealed an improvement of his LVEF to 45% with septal hypokinesis.  Holter monitor study performed in 10/2018 revealed very frequent PVCs and a low heart rate with average of 58 bpm; beta-blocker dose was decreased.  Patient on GDMT for his HTN and HLD diagnoses. Blood pressure well controlled at 110/62 on currently prescribed CCB and beta-blocker therapy.  Patient with intermittent vertiginous symptoms.  Amlodipine was discontinued and patient was started on lisinopril 5 mg daily.  ECG in the clinic revealed sinus bradycardia at a rate of 55 bpm.  No changes were made to patient's medication regimen.  Patient was ultimately cleared for his neurosurgical procedure.  Patient to follow-up with his primary cardiologist at [facility] for ongoing care.  Patient scheduled to undergo laminectomy and cervical spinal fusion on 05/05/2020 with Dr. [name].  Given patient's past medical history significant for cardiovascular diagnoses, presurgical cardiac clearance was sought by the PAT team.  Again, patient previously seen in 01/2020 for presurgical clearance.  Patient has not seen his primary cardiologist since being seen by Dr. Azucena Cecil, thus we reached out to [facility] practice to ensure that patient still safe to undergo neurosurgical procedure from a cardiovascular perspective.  Per cardiology, "given past medical history and time since last visit, based on ACC/AHA guidelines, patient would be at an overall ACCEPTABLE risk for the planned procedure without further cardiovascular testing".  This patient is on daily antiplatelet therapy.  Patient has been instructed on recommendations from both neurosurgery and cardiology for holding his daily low-dose ASA for 5 to 7 days prior to his procedure with plans to resume therapy as soon as bleeding risk felt to be acceptable  by attending surgeon.  Patient reported  that his last dose of ASA was on 04/26/2020.  Patient denies any previous perioperative complications with anesthesia.  He underwent a general anesthetic course here on 01/29/2020 (ASA III) with no documented complications.  Patient found to have CAUTI during his PAT appointment.  Patient treated by PAT APP 5-day course of ciprofloxacin and supportive care; see PAT APP notes.  Vitals with BMI 05/01/2020 03/05/2020 02/15/2020  Height - - -  Weight - (No Data) -  BMI - - -  Systolic 103 124 130  Diastolic 66 76 71  Pulse 63 82 82    Providers/Specialists:   NOTE: Primary physician provider listed below. Patient may have been seen by APP or partner within same practice.   PROVIDER ROLE / SPECIALTY LAST Tia Masker, MD Neurosurgery  03/31/2020  Rigoberto Noel, MD Primary Care Provider  03/25/2020  Launa Flight, MD Cardiology  10/18/2019  Debbe Odea, MD Cardiology  01/28/2020   Allergies:  Patient has no known allergies.  Current Home Medications:   No current facility-administered medications for this encounter.   Marland Kitchen aspirin 81 MG chewable tablet  . baclofen (LIORESAL) 10 MG tablet  . brimonidine (ALPHAGAN) 0.2 % ophthalmic solution  . carvedilol (COREG) 6.25 MG tablet  . cyanocobalamin 2000 MCG tablet  . famotidine (PEPCID) 20 MG tablet  . gabapentin (NEURONTIN) 300 MG capsule  . insulin aspart (NOVOLOG) 100 UNIT/ML injection  . insulin detemir (LEVEMIR FLEXTOUCH) 100 UNIT/ML FlexPen  . latanoprost (XALATAN) 0.005 % ophthalmic solution  . lisinopril (ZESTRIL) 10 MG tablet  . meloxicam (MOBIC) 7.5 MG tablet  . metFORMIN (GLUCOPHAGE-XR) 500 MG 24 hr tablet  . Multiple Vitamins-Minerals (CENTRUM SILVER 50+MEN PO)  . polyethylene glycol powder (GLYCOLAX/MIRALAX) 17 GM/SCOOP powder  . senna (SENOKOT) 8.6 MG TABS tablet  . simvastatin (ZOCOR) 20 MG tablet  . tamsulosin (FLOMAX) 0.4 MG CAPS capsule  . amLODipine (NORVASC) 5 MG tablet  . ciprofloxacin (CIPRO) 500 MG  tablet   History:   Past Medical History:  Diagnosis Date  . Bedbound   . Bilateral shoulder pain   . CHF (congestive heart failure) (HCC)   . Coronary artery disease   . Diabetes mellitus without complication (HCC)   . GERD (gastroesophageal reflux disease)   . Glaucoma   . HLD (hyperlipidemia)   . Hypertension   . Lumbar stenosis   . Non-ischemic cardiomyopathy (HCC)   . PVC (premature ventricular contraction)   . Sleep apnea    H/O NO CPAP IN 18 YRS   Past Surgical History:  Procedure Laterality Date  . ANTERIOR CERVICAL DECOMP/DISCECTOMY FUSION N/A 01/29/2020   Procedure: ANTERIOR CERVICAL DECOMPRESSION/DISCECTOMY FUSION 1 LEVEL C3/4;  Surgeon: [name], MD;  Location: [facility] ORS;  Service: Neurosurgery;  Laterality: N/A;  . arm surgery Right    4x surgery as a child, cut arm falling through glass window   . HYDROCELE EXCISION / REPAIR    . REPLACEMENT TOTAL KNEE Right    No family history on file. Social History   Tobacco Use  . Smoking status: Never Smoker  . Smokeless tobacco: Never Used  Vaping Use  . Vaping Use: Never used  Substance Use Topics  . Alcohol use: No  . Drug use: Never    Pertinent Clinical Results:  LABS: Labs reviewed: Acceptable for surgery.  Hospital Outpatient Visit on 05/01/2020  Component Date Value Ref Range Status  . aPTT 05/01/2020 31  24 - 36 seconds Final  Performed at [facility], [address] [place], [place] [address]  . Sodium 05/01/2020 142  135 - 145 mmol/L Final  . Potassium 05/01/2020 4.1  3.5 - 5.1 mmol/L Final  . Chloride 05/01/2020 104  98 - 111 mmol/L Final  . CO2 05/01/2020 29  22 - 32 mmol/L Final  . Glucose, Bld 05/01/2020 100* 70 - 99 mg/dL Final   Glucose reference range applies only to samples taken after fasting for at least 8 hours.  . BUN 05/01/2020 25* 8 - 23 mg/dL Final  . Creatinine, Ser 05/01/2020 0.79  0.61 - 1.24 mg/dL Final  . Calcium 60/45/409801/20/2022 10.0  8.9 - 10.3 mg/dL Final  .  GFR, Estimated 05/01/2020 >60  >60 mL/min Final   Comment: (NOTE) Calculated using the CKD-EPI Creatinine Equation (2021)   . Anion gap 05/01/2020 9  5 - 15 Final   Performed at [facility], [address] [place], [place] [address]  . WBC 05/01/2020 7.2  4.0 - 10.5 K/uL Final  . RBC 05/01/2020 4.65  4.22 - 5.81 MIL/uL Final  . Hemoglobin 05/01/2020 14.0  13.0 - 17.0 g/dL Final  . HCT 78/29/562101/20/2022 44.0  39.0 - 52.0 % Final  . MCV 05/01/2020 94.6  80.0 - 100.0 fL Final  . MCH 05/01/2020 30.1  26.0 - 34.0 pg Final  . MCHC 05/01/2020 31.8  30.0 - 36.0 g/dL Final  . RDW 30/86/578401/20/2022 14.5  11.5 - 15.5 % Final  . Platelets 05/01/2020 232  150 - 400 K/uL Final  . nRBC 05/01/2020 0.0  0.0 - 0.2 % Final   Performed at [facility], [address] [place], [place] [address]  . MRSA, PCR 05/01/2020 NEGATIVE  NEGATIVE Final  . Staphylococcus aureus 05/01/2020 NEGATIVE  NEGATIVE Final   Comment: (NOTE) The Xpert SA Assay (FDA approved for NASAL specimens in patients 75 years of age and older), is one component of a comprehensive surveillance program. It is not intended to diagnose infection nor to guide or monitor treatment. Performed at [facility], [address] [place], [place] [address]   . ABO/RH(D) 05/01/2020 A POS   Final  . Antibody Screen 05/01/2020 NEG   Final  . Sample Expiration 05/01/2020 05/15/2020,2359   Final  . Extend sample reason 05/01/2020    Final                   Value:NO TRANSFUSIONS OR PREGNANCY IN THE PAST 3 MONTHS Performed at [facility], [address] [place], [place] [address]   . Color, Urine 05/01/2020 YELLOW* YELLOW Final  . APPearance 05/01/2020 CLOUDY* CLEAR Final  . Specific Gravity, Urine 05/01/2020 1.017  1.005 - 1.030 Final  . pH 05/01/2020 5.0  5.0 - 8.0 Final  . Glucose, UA 05/01/2020 NEGATIVE  NEGATIVE mg/dL Final  . Hgb urine dipstick 05/01/2020 SMALL* NEGATIVE Final  . Bilirubin Urine 05/01/2020  NEGATIVE  NEGATIVE Final  . Ketones, ur 05/01/2020 NEGATIVE  NEGATIVE mg/dL Final  . Protein, ur 10/27/253601/20/2022 100* NEGATIVE mg/dL Final  . Nitrite 64/40/347401/20/2022 POSITIVE* NEGATIVE Final  . Leukocytes,Ua 05/01/2020 LARGE* NEGATIVE Final  . RBC / HPF 05/01/2020 6-10  0 - 5 RBC/hpf Final  . WBC, UA 05/01/2020 >50* 0 - 5 WBC/hpf Final  . Bacteria, UA 05/01/2020 MANY* NONE SEEN Final  . Squamous Epithelial / LPF 05/01/2020 0-5  0 - 5 Final  . WBC Clumps 05/01/2020 PRESENT   Final  . Mucus 05/01/2020 PRESENT  Final  . Hyaline Casts, UA 05/01/2020 PRESENT   Final   Performed at [facility], [address] [place] [place], [place] [address]  . Prothrombin Time 05/01/2020 13.1  11.4 - 15.2 seconds Final  . INR 05/01/2020 1.0  0.8 - 1.2 Final   Comment: (NOTE) INR goal varies based on device and disease states. Performed at [facility], [address] [place], [place] [address]   . SARS Coronavirus 2 05/01/2020 NEGATIVE  NEGATIVE Final   Comment: (NOTE) SARS-CoV-2 target nucleic acids are NOT DETECTED.  The SARS-CoV-2 RNA is generally detectable in upper and lower respiratory specimens during the acute phase of infection. Negative results do not preclude SARS-CoV-2 infection, do not rule out co-infections with other pathogens, and should not be used as the sole basis for treatment or other patient management decisions. Negative results must be combined with clinical observations, patient history, and epidemiological information. The expected result is Negative.  Fact Sheet for Patients: HairSlick.no  Fact Sheet for Healthcare Providers: quierodirigir.com  This test is not yet approved or cleared by the [place] FDA and  has been authorized for detection and/or diagnosis of SARS-CoV-2 by FDA under an Emergency Use Authorization (EUA). This EUA will remain  in effect (meaning this test can be used) for the duration of  the COVID-19 declaration under Se                          ction 564(b)(1) of the Act, 21 U.S.C. section 360bbb-3(b)(1), unless the authorization is terminated or revoked sooner.  Performed at [facility], [address], [place], [place] [address]     ECG: Date: 01/28/2020 Time ECG obtained: 1350 PM Rate: 55 bpm Rhythm: sinus bradycardia; RBBB Axis (leads I and aVF): Left axis deviation Intervals: PR 196 ms. QRS 188 ms. QTc 484 ms. ST segment and T wave changes: No evidence of acute ST segment elevation or depression Comparison: Similar to previous tracing obtained on 01/14/2020 No previous tracings available for review and comparison.    IMAGING / PROCEDURES: MRI CERVICAL SPINE WO CONTRAST performed on 03/27/2020 1. Sequela of C3- 4 ACDF.  2. Moderate to severe spinal canal and neural foraminal narrowing at this level is improved since prior exam. 3. 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 4. Advanced multilevel spondylosis, otherwise unchanged at the remaining cervical levels.  ECHOCARDIOGRAM done on 01/15/2020 1. Left ventricular ejection fraction, by estimation, is 60 to 65%. The left ventricle has normal function.  2. The left ventricle has no regional  wall motion abnormalities.  3. There is mild left ventricular hypertrophy.  4. Left ventricular diastolic parameters are consistent with Grade I diastolic dysfunction (impaired relaxation).  5. Right ventricular systolic function is normal. The right ventricular size is normal.  6. The mitral valve is normal in structure. Trivial mitral valve regurgitation. No evidence of mitral stenosis.  7. The aortic valve was not well visualized. Aortic valve regurgitation is not visualized. No aortic stenosis is present.  8. Aortic dilatation noted. There is borderline dilatation of the aortic root, measuring 38 mm.  9. The inferior vena cava is normal in size with greater than 50% respiratory variability, suggesting right  atrial pressure of 3 mmHg.   MRI CERVICAL SPINE WO CONTRAST done on 01/19/2020 1. 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 The canal is narrowed to 66mm. Focal T2 cord signal abnormality is present at this level, most consistent with edema. 2. Moderate right  and mild left foraminal stenosis at C2-3. 3. Moderate right and mild left foraminal stenosis at C4-5. 4. Severe right and moderate left foraminal stenosis at C5-6. 5. Moderate bilateral foraminal narrowing at C6-7 and C7-T1.  US ARTERIAL ABI done on 01/16/2020 1. Right ABI: 1.17. Right Lower Extremity: Normal arterial waveforms at the ankle. A nonspecific cardiac arrhythmia is noted. 2. Left ABI: 1.02. Left Lower Extremity: Normal arterial waveforms at the ankle. 3. No evidence of hemodynamically significant lower extremity arterial occlusive disease at rest.  MRI THORACIC SPINE WO CONTRAST 1. No acute fracture in the thoracic region. Chronic fusion at T8 and T9. 2. Degenerative changes throughout the thoracic region as outlined above. At T5-6, there are bilateral posterolateral disc herniations more prominent towards the left. Effacement of the ventral subarachnoid space with mild cord deformity. Subarachnoid space remains present dorsal to the cord however. Bilateral foraminal stenosis. No other level shows any deformation of the cord. 3. At T9-10, there is facet arthropathy with bilateral foraminal stenosis. 4. At T10-11, there is bulging of the disc and facet arthropathy with foraminal stenosis left worse than right. 5. At T11-12, there is bulging of the disc and facet arthropathy with foraminal stenosis left worse than right.  MRI LUMBAR SPINE WO CONTRAST done on 01/14/2020 1. Multifactorial degenerative changes at L2-3 through L4-5 with resultant severe canal with bilateral subarticular stenosis as above, most pronounced at L3-4. 2. Two separate synovial cysts measuring up to 21 mm about the right L5-S1 facet with resultant  moderate right foraminal and right lateral recess stenosis as above. 3. Moderate to severe bilateral L2 through L5 foraminal stenosis as detailed above.   Impression and Plan:  Roy [name]. has been referred for pre-anesthesia review and clearance prior to him undergoing the planned anesthetic and procedural courses. Available labs, pertinent testing, and imaging results were personally reviewed by me. This patient has been appropriately cleared by cardiology with an overall ACCEPTABLE risk of significant perioperative cardiovascular complications.  Patient currently on treatment for CAUTI using a 5-day course of ciprofloxacin.  This has been communicated to the primary attending surgeon.  With that being said, based on clinical review performed today (05/02/20), barring any significant acute changes in the patient's overall condition, it is anticipated that he will be able to proceed with the planned surgical intervention. Any acute changes in clinical condition may necessitate his procedure being postponed and/or cancelled. Pre-surgical instructions were reviewed with the patient during his PAT appointment and questions were fielded by PAT clinical staff.  Quentin Mulling, MSN, APRN, FNP-C, CEN [facility]  Peri-operative Services Nurse Practitioner Phone: [phone] 05/02/20 2:48 PM  NOTE: This note has been prepared using Dragon dictation software. Despite my best ability to proofread, there is always the potential that unintentional transcriptional errors may still occur from this process.

## 2020-05-04 LAB — URINE CULTURE: Culture: >=100000 — AB

## 2020-05-05 ENCOUNTER — Other Ambulatory Visit: Payer: Self-pay

## 2020-05-05 ENCOUNTER — Inpatient Hospital Stay: Payer: Medicare HMO | Admitting: Urgent Care

## 2020-05-05 ENCOUNTER — Inpatient Hospital Stay: Payer: Medicare HMO

## 2020-05-05 ENCOUNTER — Encounter
Admission: RE | Disposition: A | Discharge status: Home Health | Payer: Self-pay | Source: Home / Self Care | Attending: Neurosurgery

## 2020-05-05 ENCOUNTER — Encounter: Payer: Self-pay | Admitting: Neurosurgery

## 2020-05-05 ENCOUNTER — Inpatient Hospital Stay
Admission: RE | Admit: 2020-05-05 | Discharge: 2020-05-08 | DRG: 029 | Disposition: A | Discharge status: Home Health | Payer: Medicare HMO | Attending: Neurosurgery | Admitting: Neurosurgery

## 2020-05-05 DIAGNOSIS — G4730 Sleep apnea, unspecified: Secondary | ICD-10-CM | POA: Diagnosis present

## 2020-05-05 DIAGNOSIS — E785 Hyperlipidemia, unspecified: Secondary | ICD-10-CM | POA: Diagnosis present

## 2020-05-05 DIAGNOSIS — Z96651 Presence of right artificial knee joint: Secondary | ICD-10-CM | POA: Diagnosis present

## 2020-05-05 DIAGNOSIS — Z794 Long term (current) use of insulin: Secondary | ICD-10-CM

## 2020-05-05 DIAGNOSIS — Z419 Encounter for procedure for purposes other than remedying health state, unspecified: Secondary | ICD-10-CM

## 2020-05-05 DIAGNOSIS — Z538 Procedure and treatment not carried out for other reasons: Secondary | ICD-10-CM | POA: Diagnosis present

## 2020-05-05 DIAGNOSIS — K219 Gastro-esophageal reflux disease without esophagitis: Secondary | ICD-10-CM | POA: Diagnosis present

## 2020-05-05 DIAGNOSIS — I509 Heart failure, unspecified: Secondary | ICD-10-CM | POA: Diagnosis present

## 2020-05-05 DIAGNOSIS — Z791 Long term (current) use of non-steroidal anti-inflammatories (NSAID): Secondary | ICD-10-CM | POA: Diagnosis not present

## 2020-05-05 DIAGNOSIS — I428 Other cardiomyopathies: Secondary | ICD-10-CM | POA: Diagnosis present

## 2020-05-05 DIAGNOSIS — Z7401 Bed confinement status: Secondary | ICD-10-CM | POA: Diagnosis not present

## 2020-05-05 DIAGNOSIS — I2510 Atherosclerotic heart disease of native coronary artery without angina pectoris: Secondary | ICD-10-CM | POA: Diagnosis present

## 2020-05-05 DIAGNOSIS — I110 Hypertensive heart disease with heart failure: Secondary | ICD-10-CM | POA: Diagnosis present

## 2020-05-05 DIAGNOSIS — Z7982 Long term (current) use of aspirin: Secondary | ICD-10-CM | POA: Diagnosis not present

## 2020-05-05 DIAGNOSIS — Z79899 Other long term (current) drug therapy: Secondary | ICD-10-CM | POA: Diagnosis not present

## 2020-05-05 DIAGNOSIS — Z20822 Contact with and (suspected) exposure to covid-19: Secondary | ICD-10-CM | POA: Diagnosis present

## 2020-05-05 DIAGNOSIS — E119 Type 2 diabetes mellitus without complications: Secondary | ICD-10-CM | POA: Diagnosis present

## 2020-05-05 DIAGNOSIS — M4802 Spinal stenosis, cervical region: Secondary | ICD-10-CM | POA: Diagnosis present

## 2020-05-05 DIAGNOSIS — H409 Unspecified glaucoma: Secondary | ICD-10-CM | POA: Diagnosis present

## 2020-05-05 DIAGNOSIS — G959 Disease of spinal cord, unspecified: Principal | ICD-10-CM | POA: Diagnosis present

## 2020-05-05 DIAGNOSIS — M4322 Fusion of spine, cervical region: Secondary | ICD-10-CM

## 2020-05-05 HISTORY — DX: Spinal stenosis, lumbar region without neurogenic claudication: M48.061

## 2020-05-05 LAB — GLUCOSE, CAPILLARY
Glucose-Capillary: 115 mg/dL — ABNORMAL HIGH (ref 70–99)
Glucose-Capillary: 113 mg/dL — ABNORMAL HIGH (ref 70–99)
Glucose-Capillary: 139 mg/dL — ABNORMAL HIGH (ref 70–99)

## 2020-05-05 SURGERY — POSTERIOR CERVICAL FUSION/FORAMINOTOMY LEVEL 1
Anesthesia: General

## 2020-05-05 MED ORDER — VASOPRESSIN 20 UNIT/ML IV SOLN
INTRAVENOUS | Status: AC
  Filled 2020-05-05: qty 1

## 2020-05-05 MED ORDER — PROPOFOL 10 MG/ML IV BOLUS
INTRAVENOUS | Status: DC | PRN
  Administered 2020-05-05: 140 mg via INTRAVENOUS

## 2020-05-05 MED ORDER — FENTANYL CITRATE (PF) 100 MCG/2ML IJ SOLN
INTRAMUSCULAR | Status: DC | PRN
  Administered 2020-05-05: 100 ug via INTRAVENOUS

## 2020-05-05 MED ORDER — FAMOTIDINE 20 MG PO TABS
20.0000 mg | ORAL_TABLET | Freq: Every evening | ORAL | Status: DC
  Administered 2020-05-06: 20 mg via ORAL

## 2020-05-05 MED ORDER — DEXAMETHASONE SODIUM PHOSPHATE 10 MG/ML IJ SOLN
INTRAMUSCULAR | Status: AC
  Filled 2020-05-05: qty 1

## 2020-05-05 MED ORDER — ACETAMINOPHEN 10 MG/ML IV SOLN: 1000.0000 mg | Freq: Once | INTRAVENOUS | Status: DC | PRN

## 2020-05-05 MED ORDER — BUPIVACAINE HCL (PF) 0.5 % IJ SOLN
INTRAMUSCULAR | Status: AC
  Filled 2020-05-05: qty 60

## 2020-05-05 MED ORDER — GABAPENTIN 300 MG PO CAPS
ORAL_CAPSULE | ORAL | Status: AC
  Administered 2020-05-05: 300 mg via ORAL
  Filled 2020-05-05: qty 1

## 2020-05-05 MED ORDER — PROPOFOL 500 MG/50ML IV EMUL
INTRAVENOUS | Status: DC | PRN
  Administered 2020-05-05: 50 ug/kg/min via INTRAVENOUS

## 2020-05-05 MED ORDER — FENTANYL CITRATE (PF) 100 MCG/2ML IJ SOLN
INTRAMUSCULAR | Status: AC
  Filled 2020-05-05: qty 2

## 2020-05-05 MED ORDER — REMIFENTANIL HCL 1 MG IV SOLR
INTRAVENOUS | Status: DC | PRN
  Administered 2020-05-05: .05 ug/kg/min via INTRAVENOUS

## 2020-05-05 MED ORDER — INSULIN DETEMIR 100 UNIT/ML ~~LOC~~ SOLN
12.0000 [IU] | Freq: Every day | SUBCUTANEOUS | Status: DC
  Administered 2020-05-06 – 2020-05-07 (×2): 12 [IU] via SUBCUTANEOUS
  Filled 2020-05-05 (×4): qty 0.12

## 2020-05-05 MED ORDER — OXYCODONE HCL 5 MG PO TABS: 5.0000 mg | ORAL_TABLET | Freq: Once | ORAL | Status: DC | PRN

## 2020-05-05 MED ORDER — KCL IN DEXTROSE-NACL 20-5-0.45 MEQ/L-%-% IV SOLN
INTRAVENOUS | Status: DC
  Filled 2020-05-05 (×4): qty 1000

## 2020-05-05 MED ORDER — ACETAMINOPHEN 325 MG PO TABS: 650.0000 mg | ORAL_TABLET | Freq: Four times a day (QID) | ORAL | Status: DC | PRN

## 2020-05-05 MED ORDER — CARVEDILOL 12.5 MG PO TABS
ORAL_TABLET | ORAL | Status: AC
  Administered 2020-05-05: 6.25 mg via ORAL
  Filled 2020-05-05: qty 1

## 2020-05-05 MED ORDER — LIDOCAINE HCL (PF) 2 % IJ SOLN
INTRAMUSCULAR | Status: AC
  Filled 2020-05-05: qty 5

## 2020-05-05 MED ORDER — LISINOPRIL 10 MG PO TABS
10.0000 mg | ORAL_TABLET | ORAL | Status: DC
Start: 2020-05-06 — End: 2020-05-08
  Administered 2020-05-06 – 2020-05-08 (×3): 10 mg via ORAL
  Filled 2020-05-05 (×3): qty 1

## 2020-05-05 MED ORDER — SODIUM CHLORIDE 0.9 % IV SOLN: INTRAVENOUS | Status: DC | PRN

## 2020-05-05 MED ORDER — EPHEDRINE SULFATE 50 MG/ML IJ SOLN
INTRAMUSCULAR | Status: DC | PRN
  Administered 2020-05-05 (×2): 10 mg via INTRAVENOUS

## 2020-05-05 MED ORDER — SODIUM CHLORIDE 0.9 % IV SOLN: INTRAVENOUS | Status: DC

## 2020-05-05 MED ORDER — DEXAMETHASONE SODIUM PHOSPHATE 10 MG/ML IJ SOLN
INTRAMUSCULAR | Status: DC | PRN
  Administered 2020-05-05: 4 mg via INTRAVENOUS

## 2020-05-05 MED ORDER — EPINEPHRINE PF 1 MG/ML IJ SOLN
INTRAMUSCULAR | Status: AC
  Filled 2020-05-05: qty 1

## 2020-05-05 MED ORDER — TAMSULOSIN HCL 0.4 MG PO CAPS
0.4000 mg | ORAL_CAPSULE | Freq: Every day | ORAL | Status: DC
  Administered 2020-05-05 – 2020-05-07 (×2): 0.4 mg via ORAL
  Filled 2020-05-05 (×4): qty 1

## 2020-05-05 MED ORDER — REMIFENTANIL HCL 1 MG IV SOLR
INTRAVENOUS | Status: AC
  Filled 2020-05-05: qty 1000

## 2020-05-05 MED ORDER — POLYETHYLENE GLYCOL 3350 17 GM/SCOOP PO POWD
17.0000 g | Freq: Every day | ORAL | Status: DC | PRN
  Filled 2020-05-05: qty 255

## 2020-05-05 MED ORDER — VANCOMYCIN HCL 1000 MG IV SOLR
INTRAVENOUS | Status: AC
  Filled 2020-05-05: qty 1000

## 2020-05-05 MED ORDER — PROPOFOL 10 MG/ML IV BOLUS
INTRAVENOUS | Status: AC
  Filled 2020-05-05: qty 40

## 2020-05-05 MED ORDER — SODIUM CHLORIDE FLUSH 0.9 % IV SOLN
INTRAVENOUS | Status: AC
  Filled 2020-05-05: qty 20

## 2020-05-05 MED ORDER — SUCCINYLCHOLINE CHLORIDE 200 MG/10ML IV SOSY
PREFILLED_SYRINGE | INTRAVENOUS | Status: AC
  Filled 2020-05-05: qty 10

## 2020-05-05 MED ORDER — OXYCODONE HCL 5 MG/5ML PO SOLN: 5.0000 mg | Freq: Once | ORAL | Status: DC | PRN

## 2020-05-05 MED ORDER — GABAPENTIN 300 MG PO CAPS
300.0000 mg | ORAL_CAPSULE | Freq: Every day | ORAL | Status: DC
Start: 2020-05-05 — End: 2020-05-08

## 2020-05-05 MED ORDER — FENTANYL CITRATE (PF) 100 MCG/2ML IJ SOLN
25.0000 ug | INTRAMUSCULAR | Status: DC | PRN
Start: 2020-05-05 — End: 2020-05-05

## 2020-05-05 MED ORDER — VITAMIN B-12 1000 MCG PO TABS
2000.0000 ug | ORAL_TABLET | Freq: Every day | ORAL | Status: DC
  Administered 2020-05-07 – 2020-05-08 (×2): 2000 ug via ORAL
  Filled 2020-05-05 (×4): qty 2

## 2020-05-05 MED ORDER — CARVEDILOL 12.5 MG PO TABS
6.2500 mg | ORAL_TABLET | Freq: Two times a day (BID) | ORAL | Status: DC
  Administered 2020-05-07 – 2020-05-08 (×2): 6.25 mg via ORAL
  Filled 2020-05-05: qty 0.5

## 2020-05-05 MED ORDER — ONDANSETRON 4 MG PO TBDP: 4.0000 mg | ORAL_TABLET | Freq: Four times a day (QID) | ORAL | Status: DC | PRN

## 2020-05-05 MED ORDER — BACITRACIN ZINC 500 UNIT/GM EX OINT
TOPICAL_OINTMENT | CUTANEOUS | Status: AC
  Filled 2020-05-05: qty 28.35

## 2020-05-05 MED ORDER — CEFAZOLIN SODIUM-DEXTROSE 2-4 GM/100ML-% IV SOLN
2.0000 g | Freq: Once | INTRAVENOUS | Status: AC
  Administered 2020-05-06: 2 g via INTRAVENOUS

## 2020-05-05 MED ORDER — PHENYLEPHRINE HCL (PRESSORS) 10 MG/ML IV SOLN
INTRAVENOUS | Status: DC | PRN
  Administered 2020-05-05 (×2): 100 ug via INTRAVENOUS

## 2020-05-05 MED ORDER — PROPOFOL 500 MG/50ML IV EMUL
INTRAVENOUS | Status: AC
  Filled 2020-05-05: qty 50

## 2020-05-05 MED ORDER — ONDANSETRON HCL 4 MG/2ML IJ SOLN: 4.0000 mg | Freq: Four times a day (QID) | INTRAMUSCULAR | Status: DC | PRN

## 2020-05-05 MED ORDER — LATANOPROST 0.005 % OP SOLN
1.0000 [drp] | Freq: Every day | OPHTHALMIC | Status: DC
  Administered 2020-05-05 – 2020-05-07 (×3): 1 [drp] via OPHTHALMIC
  Filled 2020-05-05: qty 2.5

## 2020-05-05 MED ORDER — ONDANSETRON HCL 4 MG/2ML IJ SOLN
INTRAMUSCULAR | Status: DC | PRN
  Administered 2020-05-05: 4 mg via INTRAVENOUS

## 2020-05-05 MED ORDER — ORAL CARE MOUTH RINSE: 15.0000 mL | Freq: Once | OROMUCOSAL | Status: DC

## 2020-05-05 MED ORDER — PHENYLEPHRINE HCL (PRESSORS) 10 MG/ML IV SOLN
INTRAVENOUS | Status: AC
  Filled 2020-05-05: qty 1

## 2020-05-05 MED ORDER — SODIUM CHLORIDE 0.9 % IV SOLN
INTRAVENOUS | Status: DC | PRN
  Administered 2020-05-05: 45 ug/min via INTRAVENOUS

## 2020-05-05 MED ORDER — BRIMONIDINE TARTRATE 0.2 % OP SOLN
1.0000 [drp] | Freq: Every day | OPHTHALMIC | Status: DC
  Administered 2020-05-05: 1 [drp] via OPHTHALMIC
  Filled 2020-05-05: qty 5

## 2020-05-05 MED ORDER — POLYETHYLENE GLYCOL 3350 17 G PO PACK
17.0000 g | PACK | Freq: Every day | ORAL | Status: DC | PRN
  Administered 2020-05-07 – 2020-05-08 (×2): 17 g via ORAL
  Filled 2020-05-05 (×3): qty 1

## 2020-05-05 MED ORDER — ONDANSETRON HCL 4 MG/2ML IJ SOLN
INTRAMUSCULAR | Status: AC
  Filled 2020-05-05: qty 2

## 2020-05-05 MED ORDER — CEFAZOLIN SODIUM-DEXTROSE 2-4 GM/100ML-% IV SOLN
INTRAVENOUS | Status: AC
  Filled 2020-05-05: qty 100

## 2020-05-05 MED ORDER — INSULIN ASPART 100 UNIT/ML ~~LOC~~ SOLN: 4.0000 [IU] | Freq: Three times a day (TID) | SUBCUTANEOUS | Status: DC

## 2020-05-05 MED ORDER — MELOXICAM 7.5 MG PO TABS
7.5000 mg | ORAL_TABLET | Freq: Every day | ORAL | Status: DC
  Administered 2020-05-07: 7.5 mg via ORAL

## 2020-05-05 MED ORDER — BACLOFEN 10 MG PO TABS
5.0000 mg | ORAL_TABLET | Freq: Three times a day (TID) | ORAL | Status: DC
  Administered 2020-05-05 – 2020-05-08 (×7): 5 mg via ORAL
  Filled 2020-05-05 (×11): qty 0.5

## 2020-05-05 MED ORDER — ACETAMINOPHEN 650 MG RE SUPP: 650.0000 mg | Freq: Four times a day (QID) | RECTAL | Status: DC | PRN

## 2020-05-05 MED ORDER — ONDANSETRON HCL 4 MG/2ML IJ SOLN: 4.0000 mg | Freq: Once | INTRAMUSCULAR | Status: DC | PRN

## 2020-05-05 MED ORDER — CIPROFLOXACIN HCL 500 MG PO TABS
500.0000 mg | ORAL_TABLET | Freq: Two times a day (BID) | ORAL | Status: DC
  Administered 2020-05-06 – 2020-05-07 (×2): 500 mg via ORAL

## 2020-05-05 MED ORDER — BUPIVACAINE LIPOSOME 1.3 % IJ SUSP
INTRAMUSCULAR | Status: AC
  Filled 2020-05-05: qty 20

## 2020-05-05 MED ORDER — CIPROFLOXACIN HCL 500 MG PO TABS
ORAL_TABLET | ORAL | Status: AC
  Administered 2020-05-05: 500 mg via ORAL
  Filled 2020-05-05: qty 1

## 2020-05-05 MED ORDER — SIMVASTATIN 20 MG PO TABS
20.0000 mg | ORAL_TABLET | Freq: Every day | ORAL | Status: DC
  Administered 2020-05-05 – 2020-05-07 (×2): 20 mg via ORAL
  Filled 2020-05-05 (×4): qty 1

## 2020-05-05 MED ORDER — ADULT MULTIVITAMIN W/MINERALS CH
1.0000 | ORAL_TABLET | Freq: Every day | ORAL | Status: DC
  Administered 2020-05-07 – 2020-05-08 (×2): 1 via ORAL
  Filled 2020-05-05 (×4): qty 1

## 2020-05-05 MED ORDER — PROPOFOL 10 MG/ML IV BOLUS
INTRAVENOUS | Status: AC
  Filled 2020-05-05: qty 20

## 2020-05-05 MED ORDER — CHLORHEXIDINE GLUCONATE 0.12 % MT SOLN: 15.0000 mL | Freq: Once | OROMUCOSAL | Status: DC

## 2020-05-05 MED ORDER — THROMBIN 5000 UNITS EX SOLR
CUTANEOUS | Status: AC
  Filled 2020-05-05: qty 5000

## 2020-05-05 SURGICAL SUPPLY — 56 items
BUR NEURO DRILL SOFT 3.0X3.8M (BURR) ×2 IMPLANT
BUR SABER DIAMOND 3.0 (BURR) IMPLANT
CANISTER SUCT 1200ML W/VALVE (MISCELLANEOUS) IMPLANT
CHLORAPREP W/TINT 26 (MISCELLANEOUS) IMPLANT
COUNTER NEEDLE 20/40 LG (NEEDLE) ×2 IMPLANT
COVER LIGHT HANDLE STERIS (MISCELLANEOUS) ×4 IMPLANT
COVER WAND RF STERILE (DRAPES) IMPLANT
CUP MEDICINE 2OZ PLAST GRAD ST (MISCELLANEOUS) ×4 IMPLANT
DERMABOND ADVANCED (GAUZE/BANDAGES/DRESSINGS) ×1
DERMABOND ADVANCED .7 DNX12 (GAUZE/BANDAGES/DRESSINGS) ×1 IMPLANT
DRAPE C-ARM 42X72 X-RAY (DRAPES) ×4 IMPLANT
DRAPE C-ARMOR (DRAPES) ×2 IMPLANT
DRAPE INCISE IOBAN 66X45 STRL (DRAPES) IMPLANT
DRAPE MICROSCOPE SPINE 48X150 (DRAPES) IMPLANT
DRAPE THYROID T SHEET (DRAPES) ×2 IMPLANT
DRSG OPSITE POSTOP 4X6 (GAUZE/BANDAGES/DRESSINGS) IMPLANT
DRSG OPSITE POSTOP 4X8 (GAUZE/BANDAGES/DRESSINGS) IMPLANT
ELECT CAUTERY BLADE TIP 2.5 (TIP) ×2
ELECTRODE CAUTERY BLDE TIP 2.5 (TIP) ×1 IMPLANT
FEE INTRAOP MONITOR IMPULS NCS (MISCELLANEOUS) IMPLANT
GAUZE SPONGE 4X4 12PLY STRL (GAUZE/BANDAGES/DRESSINGS) ×2 IMPLANT
GLOVE SRG 8 PF TXTR STRL LF DI (GLOVE) ×1 IMPLANT
GLOVE SURG SYN 7.0 (GLOVE) ×4 IMPLANT
GLOVE SURG SYN 8.0 (GLOVE) ×4 IMPLANT
GLOVE SURG UNDER POLY LF SZ7 (GLOVE) ×2 IMPLANT
GLOVE SURG UNDER POLY LF SZ8 (GLOVE) ×1
GOWN STRL REUS W/ TWL XL LVL3 (GOWN DISPOSABLE) ×2 IMPLANT
GOWN STRL REUS W/TWL XL LVL3 (GOWN DISPOSABLE) ×2
GRADUATE 1200CC STRL 31836 (MISCELLANEOUS) ×2 IMPLANT
HEMOSTAT SURGICEL 2X3 (HEMOSTASIS) IMPLANT
INTRAOP MONITOR FEE IMPULS NCS (MISCELLANEOUS)
INTRAOP MONITOR FEE IMPULSE (MISCELLANEOUS)
IV CATH ANGIO 12GX3 LT BLUE (NEEDLE) IMPLANT
KIT TURNOVER KIT A (KITS) ×2 IMPLANT
MANIFOLD NEPTUNE II (INSTRUMENTS) ×2 IMPLANT
MARKER SKIN DUAL TIP RULER LAB (MISCELLANEOUS) IMPLANT
NEEDLE HYPO 22GX1.5 SAFETY (NEEDLE) ×2 IMPLANT
PACK LAMINECTOMY NEURO (CUSTOM PROCEDURE TRAY) ×2 IMPLANT
PAD ARMBOARD 7.5X6 YLW CONV (MISCELLANEOUS) ×2 IMPLANT
PATTIES SURGICAL .5 X.5 (GAUZE/BANDAGES/DRESSINGS) ×2 IMPLANT
PIN MAYFIELD SKULL DISP (PIN) ×2 IMPLANT
SPOGE SURGIFLO 8M (HEMOSTASIS) ×1
SPONGE SURGIFLO 8M (HEMOSTASIS) ×1 IMPLANT
STAPLER SKIN PROX 35W (STAPLE) IMPLANT
SUT BONE WAX W31G (SUTURE) IMPLANT
SUT ETHILON 3 0 PS 1 (SUTURE) ×2 IMPLANT
SUT NURALON 4 0 TR CR/8 (SUTURE) IMPLANT
SUT POLYSORB 2-0 5X18 GS-10 (SUTURE) ×2 IMPLANT
SUT PROLENE 5 0 RB 2 (SUTURE) IMPLANT
SUT VIC AB 0 CT1 18XCR BRD 8 (SUTURE) ×1 IMPLANT
SUT VIC AB 0 CT1 8-18 (SUTURE) ×1
SYR 30ML LL (SYRINGE) ×2 IMPLANT
TAPE CLOTH 3X10 WHT NS LF (GAUZE/BANDAGES/DRESSINGS) ×2 IMPLANT
TOWEL OR 17X26 4PK STRL BLUE (TOWEL DISPOSABLE) ×4 IMPLANT
TRAY FOLEY MTR SLVR 16FR STAT (SET/KITS/TRAYS/PACK) ×2 IMPLANT
TUBING CONNECTING 10 (TUBING) ×2 IMPLANT

## 2020-05-05 NOTE — Anesthesia Procedure Notes (Signed)
Arterial Line Insertion Start/End1/24/2022 2:20 PM, 05/05/2020 2:27 PM Performed by: Corinda Gubler, MD, Berniece Pap, CRNA, anesthesiologist  Patient location: OR. Preanesthetic checklist: patient identified, IV checked, site marked, risks and benefits discussed, surgical consent, monitors and equipment checked, pre-op evaluation, timeout performed and anesthesia consent Patient sedated Right, radial was placed Catheter size: 20 Fr Hand hygiene performed  and maximum sterile barriers used   Attempts: 2 Procedure performed using ultrasound guided technique. Ultrasound Notes:anatomy identified, needle tip was noted to be adjacent to the nerve/plexus identified and no ultrasound evidence of intravascular and/or intraneural injection Following insertion, dressing applied and Biopatch. Post procedure assessment: normal and unchanged  Patient tolerated the procedure well with no immediate complications. Additional procedure comments: One attempt with CRNA student and CRNA Marciano Sequin. One attempt followed by Suzan Slick MD. Ultrasound guidance, successful.

## 2020-05-05 NOTE — Interval H&P Note (Signed)
History and Physical Interval Note:  05/05/2020 12:53 PM  Roy [name].  has presented today for surgery, with the diagnosis of cervical myelopathy.  The various methods of treatment have been discussed with the patient and family. After consideration of risks, benefits and other options for treatment, the patient has consented to  Procedure(s): C3-4 LAMINECTOMY & C3-4 FUSION (N/A) as a surgical intervention.  The patient's history has been reviewed, patient examined, no change in status, stable for surgery.  I have reviewed the patient's chart and labs.  Questions were answered to the patient's satisfaction.     [name]

## 2020-05-05 NOTE — H&P (Signed)
Baby Stairs. is an 75 y.o. male.   Chief Complaint: Weakness HPI: Mr. Leon is now 3 months out from his ACDF for myelopathy. He initially did very well with some improvement in his strength in his shoulders. However, he is having some decline again in strength. He has not been as mobile and has been unable to really ambulate with assistance. He does note increased weakness in his arms. He continues to have an indwelling Foley catheter and this is being changed monthly by urology. He does complain of some spasms that appear to more at nighttime and we started gabapentin to help with this as well as sleep. He additionally has been placed on trazodone. His incision is healed very well. He is swallowing normally and is tolerating a regular diet.   MRI was performed showing residual stenosis at C3/4 and we discussed a posterior decompression and fusion and he wishes to proceed.    Past Medical History:  Diagnosis Date  . Bedbound   . Bilateral shoulder pain   . CHF (congestive heart failure) (HCC)   . Coronary artery disease   . Diabetes mellitus without complication (HCC)   . GERD (gastroesophageal reflux disease)   . Glaucoma   . HLD (hyperlipidemia)   . Hypertension   . Lumbar stenosis   . Non-ischemic cardiomyopathy (HCC)   . PVC (premature ventricular contraction)   . Sleep apnea    H/O NO CPAP IN 18 YRS    Past Surgical History:  Procedure Laterality Date  . ANTERIOR CERVICAL DECOMP/DISCECTOMY FUSION N/A 01/29/2020   Procedure: ANTERIOR CERVICAL DECOMPRESSION/DISCECTOMY FUSION 1 LEVEL C3/4;  Surgeon: [name], MD;  Location: [facility] ORS;  Service: Neurosurgery;  Laterality: N/A;  . arm surgery Right    4x surgery as a child, cut arm falling through glass window   . HYDROCELE EXCISION / REPAIR    . REPLACEMENT TOTAL KNEE Right     No family history on file. Social History:  reports that he has never smoked. He has never used smokeless tobacco. He reports that he  does not drink alcohol and does not use drugs.  Allergies: No Known Allergies  Medications Prior to Admission  Medication Sig Dispense Refill  . aspirin 81 MG chewable tablet Chew 81 mg by mouth daily.    . baclofen (LIORESAL) 10 MG tablet Take 5 mg by mouth 3 (three) times daily.    . brimonidine (ALPHAGAN) 0.2 % ophthalmic solution Place 1 drop into the right eye in the morning and at bedtime.    . carvedilol (COREG) 6.25 MG tablet Take 6.25 mg by mouth 2 (two) times daily.    . ciprofloxacin (CIPRO) 500 MG tablet Take 500 mg by mouth 2 (two) times daily.    . cyanocobalamin 2000 MCG tablet Take 2,000 mcg by mouth daily.    . famotidine (PEPCID) 20 MG tablet Take 20 mg by mouth every evening.    . gabapentin (NEURONTIN) 300 MG capsule Take 300 mg by mouth at bedtime.    . insulin aspart (NOVOLOG) 100 UNIT/ML injection Inject 4-8 Units into the skin 3 (three) times daily with meals. Sliding scale    . insulin detemir (LEVEMIR FLEXTOUCH) 100 UNIT/ML FlexPen Inject 12 Units into the skin at bedtime.     Marland Kitchen latanoprost (XALATAN) 0.005 % ophthalmic solution Place 1 drop into the right eye at bedtime.    Marland Kitchen lisinopril (ZESTRIL) 10 MG tablet Take 10 mg by mouth every morning.    Marland Kitchen  meloxicam (MOBIC) 7.5 MG tablet Take 7.5 mg by mouth daily.    . metFORMIN (GLUCOPHAGE-XR) 500 MG 24 hr tablet Take 500 mg by mouth 2 (two) times daily.    . Multiple Vitamins-Minerals (CENTRUM SILVER 50+MEN PO) Take 1 tablet by mouth daily.    . polyethylene glycol powder (GLYCOLAX/MIRALAX) 17 GM/SCOOP powder Take 17 g by mouth daily as needed for moderate constipation.    . senna (SENOKOT) 8.6 MG TABS tablet Take 2 tablets (17.2 mg total) by mouth daily as needed for mild constipation. (Patient not taking: Reported on 05/01/2020) 120 tablet 0  . simvastatin (ZOCOR) 20 MG tablet Take 20 mg by mouth daily at 6 PM.    . tamsulosin (FLOMAX) 0.4 MG CAPS capsule Take 1 capsule (0.4 mg total) by mouth daily after supper. 30  capsule 1  . amLODipine (NORVASC) 5 MG tablet Take 1 tablet (5 mg total) by mouth daily. (Patient not taking: Reported on 04/23/2020)      Results for orders placed or performed during the hospital encounter of 05/05/20 (from the past 48 hour(s))  Glucose, capillary     Status: Abnormal   Collection Time: 05/05/20 12:11 PM  Result Value Ref Range   Glucose-Capillary 115 (H) 70 - 99 mg/dL    Comment: Glucose reference range applies only to samples taken after fasting for at least 8 hours.   No results found.  Review of Systems General ROS: Negative Respiratory ROS: Negative Cardiovascular ROS: Negative Gastrointestinal ROS: Negative Genito-Urinary ROS: Negative Musculoskeletal ROS: Positive for spasms Neurological ROS: Positive for weakness, numbness Dermatological ROS: Negative   Blood pressure (!) 75/58, pulse 72, temperature (!) 97.1 F (36.2 C), temperature source Temporal, resp. rate 12, SpO2 100 %. Physical Exam  General appearance: Alert, cooperative, in no acute distress Neck: Well-healed anterior incision CV: Regular rate and rhythm Pulm: Clear to auscultation  Neurologic exam:  Mental status: alertness: alert, affect: normal Speech: fluent and clear Motor:strength is 4- out of 5 diffusely and bilateral deltoid, bicep, tricep, grip, he is 4+ out of 5 strength in bilateral hip flexion, dorsiflexion, plantarflexion Sensory: Decreased light touch in bilateral hands and feet Gait: Not tested, arrives in wheelchair   Imaging: Sequela of C3-4 ACDF. Moderate to severe spinal canal and neural foraminal narrowing at  this level is improved since prior exam. 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 Advanced multilevel spondylosis, otherwise unchanged at the remaining cervical levels.  Assessment/Plan Proceed with C3/4 Laminectomy and Fusion  [name], MD 05/05/2020, 12:47 PM

## 2020-05-05 NOTE — Anesthesia Procedure Notes (Signed)
Procedure Name: Intubation Performed by: Berniece Pap, CRNA Pre-anesthesia Checklist: Patient identified, Emergency Drugs available, Suction available and Patient being monitored Patient Re-evaluated:Patient Re-evaluated prior to induction Oxygen Delivery Method: Circle system utilized Preoxygenation: Pre-oxygenation with 100% oxygen Induction Type: IV induction Ventilation: Mask ventilation without difficulty Laryngoscope Size: McGraph and 4 Grade View: Grade I Tube type: Oral Tube size: 7.0 mm Number of attempts: 1 Airway Equipment and Method: Stylet and Oral airway Placement Confirmation: ETT inserted through vocal cords under direct vision,  positive ETCO2 and breath sounds checked- equal and bilateral Secured at: 21 cm Tube secured with: Tape Dental Injury: Teeth and Oropharynx as per pre-operative assessment  Comments: Intubation performed by SRNA. Single attempt

## 2020-05-05 NOTE — Transfer of Care (Signed)
Immediate Anesthesia Transfer of Care Note  Patient: Roy [name].  Procedure(s) Performed: C3-4 LAMINECTOMY & C3-4 FUSION (N/A )  Patient Location: PACU  Anesthesia Type:General  Level of Consciousness: awake, alert  and oriented  Airway & Oxygen Therapy: Patient Spontanous Breathing and Patient connected to nasal cannula oxygen  Post-op Assessment: Report given to RN and Post -op Vital signs reviewed and stable  Post vital signs: Reviewed and stable  Last Vitals:  Vitals Value Taken Time  BP 124/94 05/05/20 1530  Temp 36.4 C 05/05/20 1530  Pulse 83 05/05/20 1536  Resp 16 05/05/20 1536  SpO2 100 % 05/05/20 1536  Vitals shown include unvalidated device data.  Last Pain:  Vitals:   05/05/20 1530  TempSrc:   PainSc: 0-No pain         Complications: No complications documented.

## 2020-05-05 NOTE — Anesthesia Preprocedure Evaluation (Addendum)
Anesthesia Evaluation  Patient identified by MRN, date of birth, ID band Patient awake  General Assessment Comment:bedbound patient, foley in situ Prior difficult arterial line placement  Reviewed: Allergy & Precautions, NPO status , Patient's Chart, lab work & pertinent test results  History of Anesthesia Complications Negative for: history of anesthetic complications  Airway Mallampati: II  TM Distance: >3 FB Neck ROM: Full    Dental  (+) Teeth Intact, Poor Dentition   Pulmonary sleep apnea , neg COPD, Patient abstained from smoking.Not current smoker,    Pulmonary exam normal breath sounds clear to auscultation       Cardiovascular Exercise Tolerance: Good METShypertension, + CAD and +CHF  (-) Past MI (-) dysrhythmias  Rhythm:Regular Rate:Normal - Systolic murmurs TTE 10/21: ECHOCARDIOGRAM done on10/08/2019 1. Left ventricular ejection fraction, by estimation, is 60 to 65%. The left ventricle has normal function.  2. The left ventricle has no regional wall motion abnormalities.  3. There is mild left ventricular hypertrophy.  4. Left ventricular diastolic parameters are consistent with Grade I diastolic dysfunction (impaired relaxation).  5. Right ventricular systolic function is normal. The right ventricular size is normal.  6. The mitral valve is normal in structure. Trivial mitral valve regurgitation. No evidence of mitral stenosis.  7. The aortic valve was not well visualized. Aortic valve regurgitation is not visualized. No aortic stenosis is present.  8. Aortic dilatation noted. There is borderline dilatation of the aortic root, measuring 38 mm.  9. The inferior vena cava is normal in size with greater than 50% respiratory variability, suggesting right atrial pressure of 3 mmHg.    Neuro/Psych negative neurological ROS  negative psych ROS   GI/Hepatic GERD  Controlled,(+)     (-) substance abuse  ,   Endo/Other   diabetes  Renal/GU negative Renal ROS     Musculoskeletal   Abdominal   Peds  Hematology   Anesthesia Other Findings Past Medical History: No date: Bedbound No date: Bilateral shoulder pain No date: CHF (congestive heart failure) (HCC) No date: Coronary artery disease No date: Diabetes mellitus without complication (HCC) No date: GERD (gastroesophageal reflux disease) No date: Glaucoma No date: HLD (hyperlipidemia) No date: Hypertension No date: Lumbar stenosis No date: Non-ischemic cardiomyopathy (HCC) No date: PVC (premature ventricular contraction) No date: Sleep apnea     Comment:  H/O NO CPAP IN 30 YRS  Reproductive/Obstetrics                            Anesthesia Physical Anesthesia Plan  ASA: III  Anesthesia Plan: General   Post-op Pain Management:    Induction: Intravenous  PONV Risk Score and Plan: 2 and Ondansetron, Dexamethasone and Treatment may vary due to age or medical condition  Airway Management Planned: Oral ETT  Additional Equipment: Arterial line  Intra-op Plan:   Post-operative Plan: Extubation in OR  Informed Consent: I have reviewed the patients History and Physical, chart, labs and discussed the procedure including the risks, benefits and alternatives for the proposed anesthesia with the patient or authorized representative who has indicated his/her understanding and acceptance.     Dental advisory given  Plan Discussed with: CRNA and Surgeon  Anesthesia Plan Comments: (Discussed risks of anesthesia with patient, including PONV, sore throat, lip/dental damage. Rare risks discussed as well, such as cardiorespiratory and neurological sequelae. Patient understands.)        Anesthesia Quick Evaluation

## 2020-05-05 NOTE — Anesthesia Postprocedure Evaluation (Signed)
Anesthesia Post Note  Patient: Roy [name].  Procedure(s) Performed: C3-4 LAMINECTOMY & C3-4 FUSION (N/A )  Patient location during evaluation: PACU Anesthesia Type: General Level of consciousness: awake and alert Pain management: pain level controlled Vital Signs Assessment: post-procedure vital signs reviewed and stable Respiratory status: spontaneous breathing, nonlabored ventilation, respiratory function stable and patient connected to nasal cannula oxygen Cardiovascular status: blood pressure returned to baseline and stable Postop Assessment: no apparent nausea or vomiting Anesthetic complications: no   No complications documented.   Last Vitals:  Vitals:   05/05/20 1745 05/05/20 1812  BP: (!) 139/92 134/88  Pulse: 88 94  Resp: 19 16  Temp:    SpO2: 95% 95%    Last Pain:  Vitals:   05/05/20 1812  TempSrc:   PainSc: 0-No pain                 Lenard Simmer

## 2020-05-06 ENCOUNTER — Inpatient Hospital Stay: Payer: Medicare HMO | Admitting: Anesthesiology

## 2020-05-06 ENCOUNTER — Inpatient Hospital Stay: Admit: 2020-05-06 | Payer: Medicare HMO | Admitting: Neurosurgery

## 2020-05-06 ENCOUNTER — Encounter
Admission: RE | Disposition: A | Discharge status: Home Health | Payer: Self-pay | Source: Home / Self Care | Attending: Neurosurgery

## 2020-05-06 ENCOUNTER — Encounter: Payer: Self-pay | Admitting: Neurosurgery

## 2020-05-06 ENCOUNTER — Inpatient Hospital Stay: Payer: Medicare HMO

## 2020-05-06 HISTORY — PX: POSTERIOR CERVICAL FUSION/FORAMINOTOMY: SHX5038

## 2020-05-06 LAB — GLUCOSE, CAPILLARY
Glucose-Capillary: 175 mg/dL — ABNORMAL HIGH (ref 70–99)
Glucose-Capillary: 191 mg/dL — ABNORMAL HIGH (ref 70–99)
Glucose-Capillary: 158 mg/dL — ABNORMAL HIGH (ref 70–99)
Glucose-Capillary: 185 mg/dL — ABNORMAL HIGH (ref 70–99)

## 2020-05-06 LAB — HEMOGLOBIN A1C
Hgb A1c MFr Bld: 5.7 % — ABNORMAL HIGH (ref 4.8–5.6)
Mean Plasma Glucose: 116.89 mg/dL

## 2020-05-06 IMAGING — RF DG C-ARM 1-60 MIN
1 series · 6 of 6 positions shown · non-contrast
Comparison: [DATE]

CLINICAL DATA: C3-4 laminectomy and fusion

EXAM:
CERVICAL SPINE - 2-3 VIEW; DG C-ARM 1-60 MIN

[Series 1: dg x-ray · 0.14mm/px · 6 of 6 slices shown]
[im 1/6]
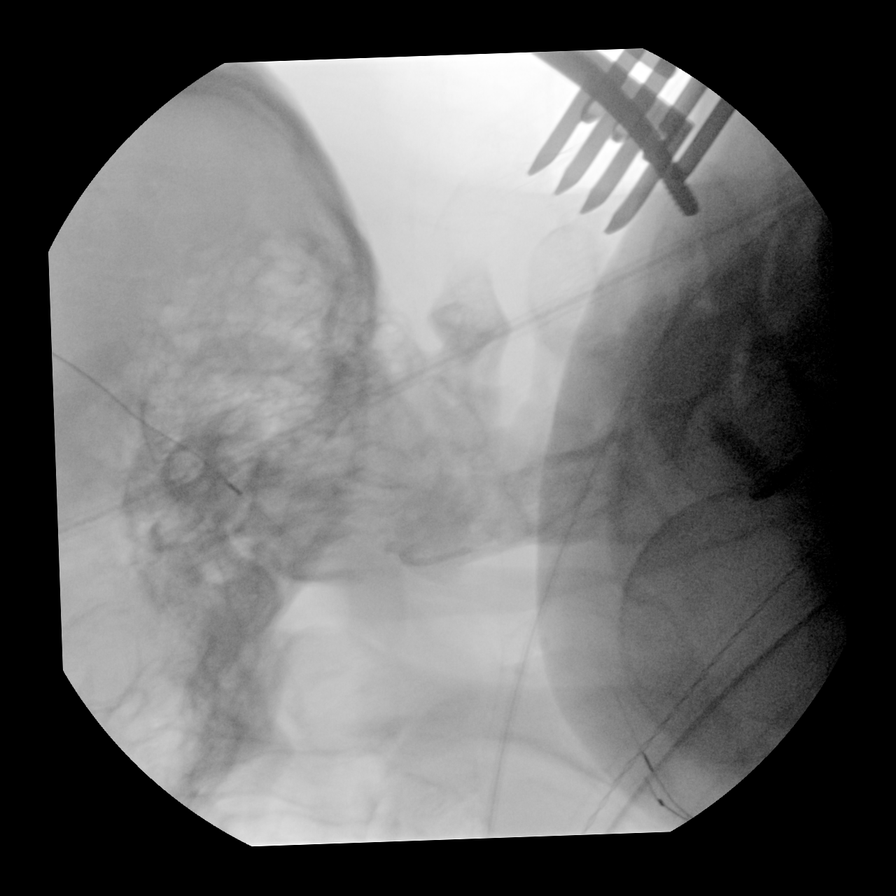
[im 2/6]
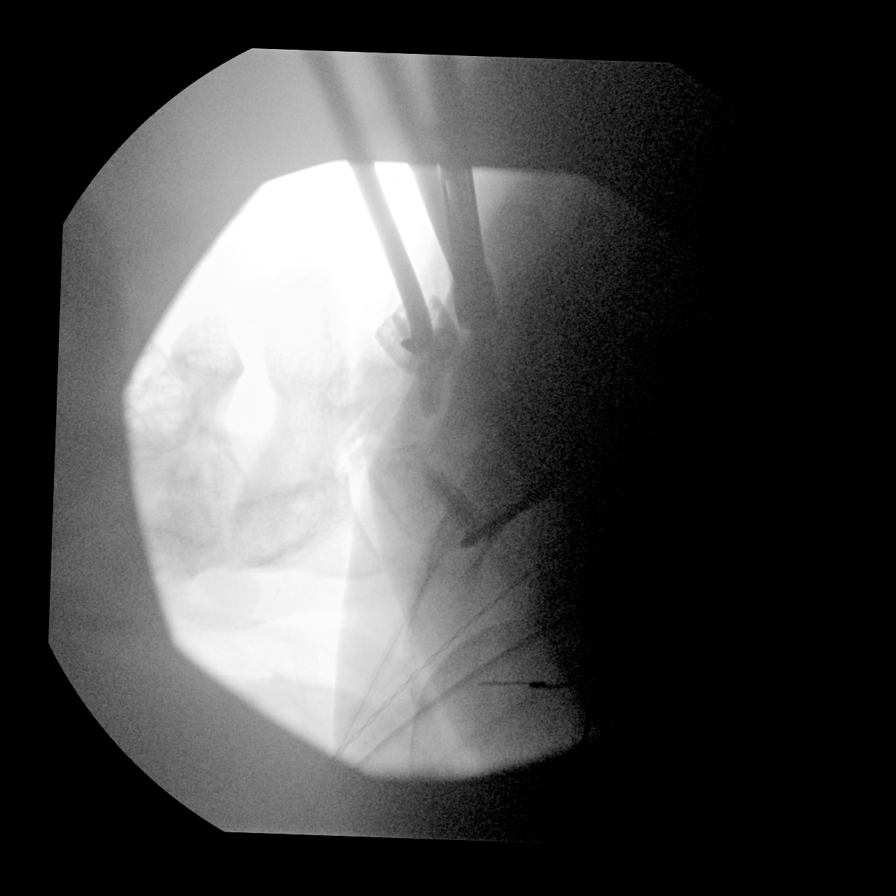
[im 3/6]
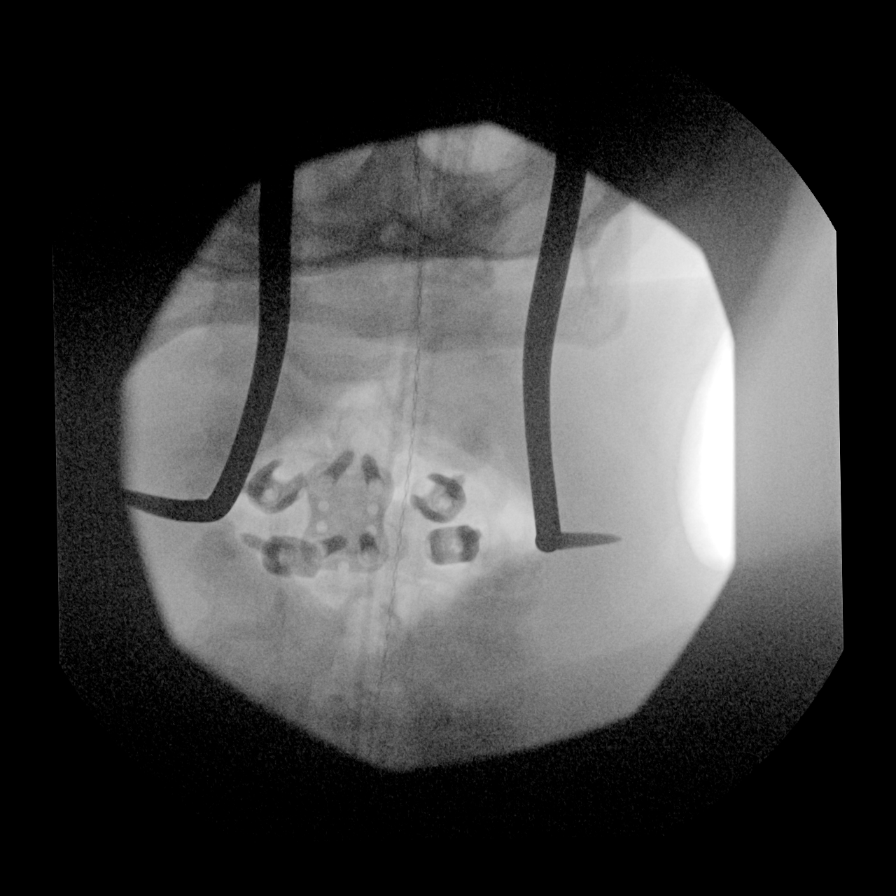
[im 4/6]
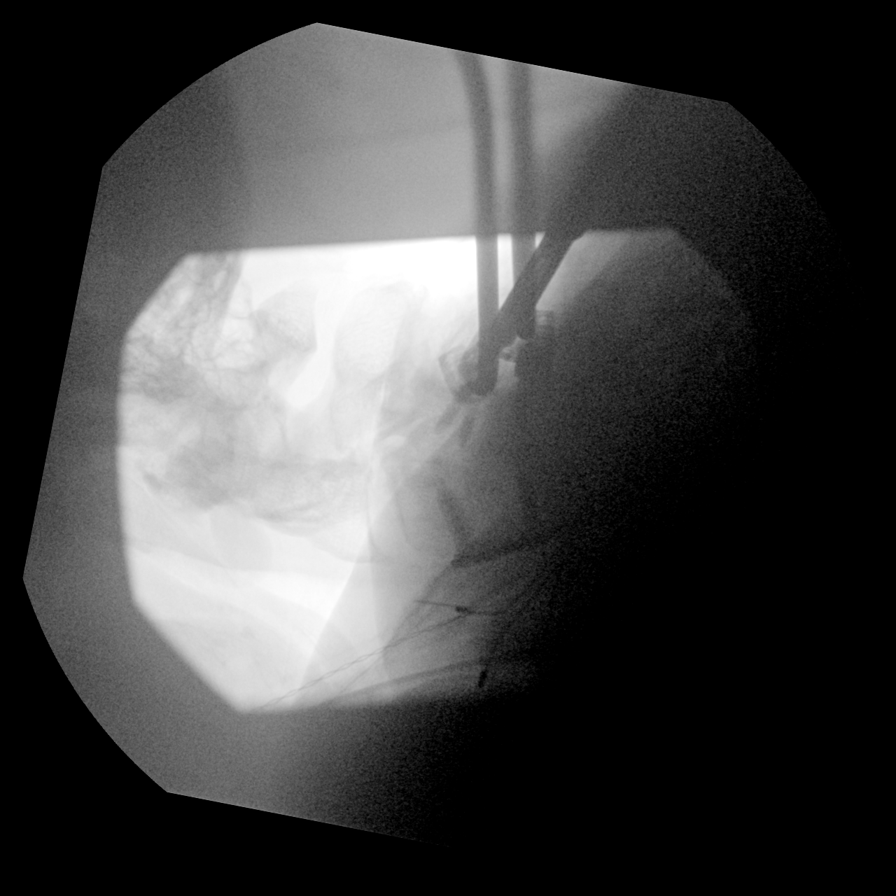
[im 5/6]
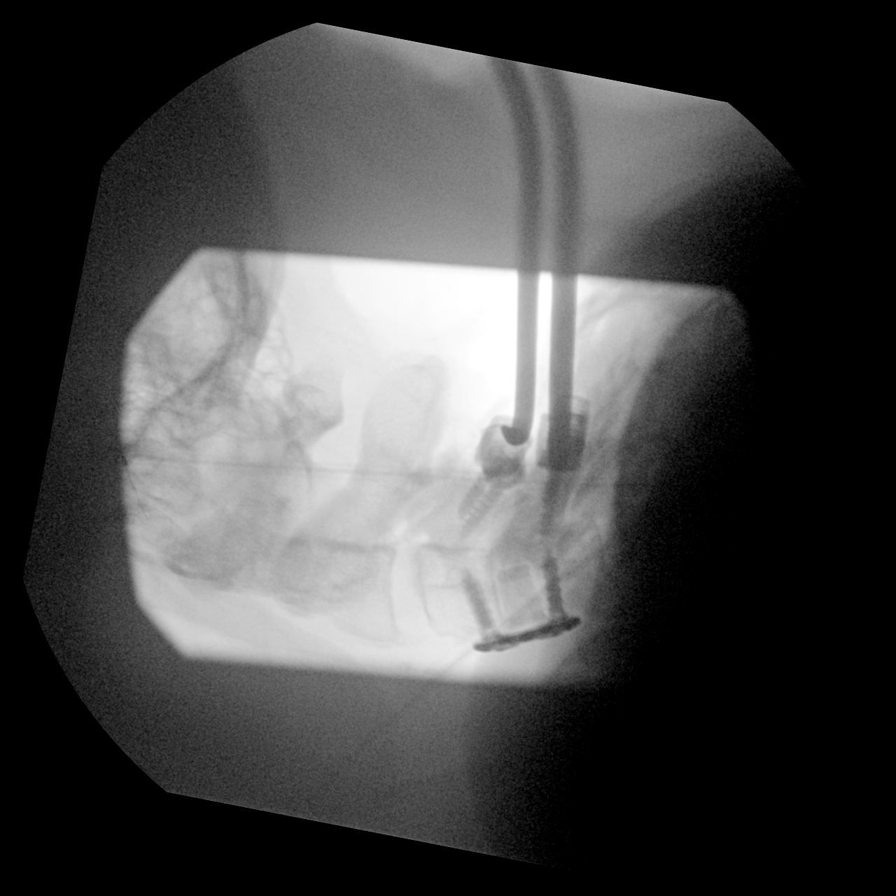
[im 6/6]
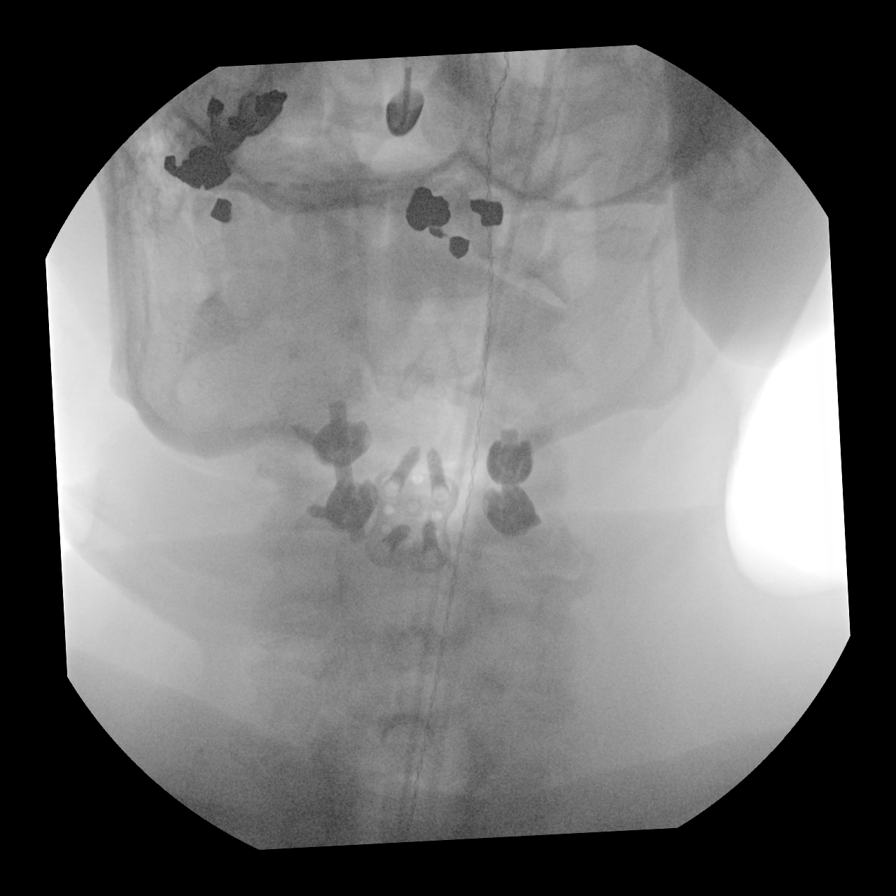

[6 of 6 positions shown; findings below may reference images not displayed]

FINDINGS: Multiple intraoperative spot images demonstrate changes of interval
posterior fusion at C3-4. No visible hardware complicating feature.
Remote changes of anterior fusion at C3-4.
IMPRESSION: Posterior fusion C3-4.  No visible complicating feature.

## 2020-05-06 IMAGING — RF DG CERVICAL SPINE 2 OR 3 VIEWS
1 series · 6 of 6 positions shown · non-contrast
Comparison: [DATE]

CLINICAL DATA: C3-4 laminectomy and fusion

EXAM:
CERVICAL SPINE - 2-3 VIEW; DG C-ARM 1-60 MIN

[Series 1: dg x-ray · 0.14mm/px · 6 of 6 slices shown]
[im 1/6]
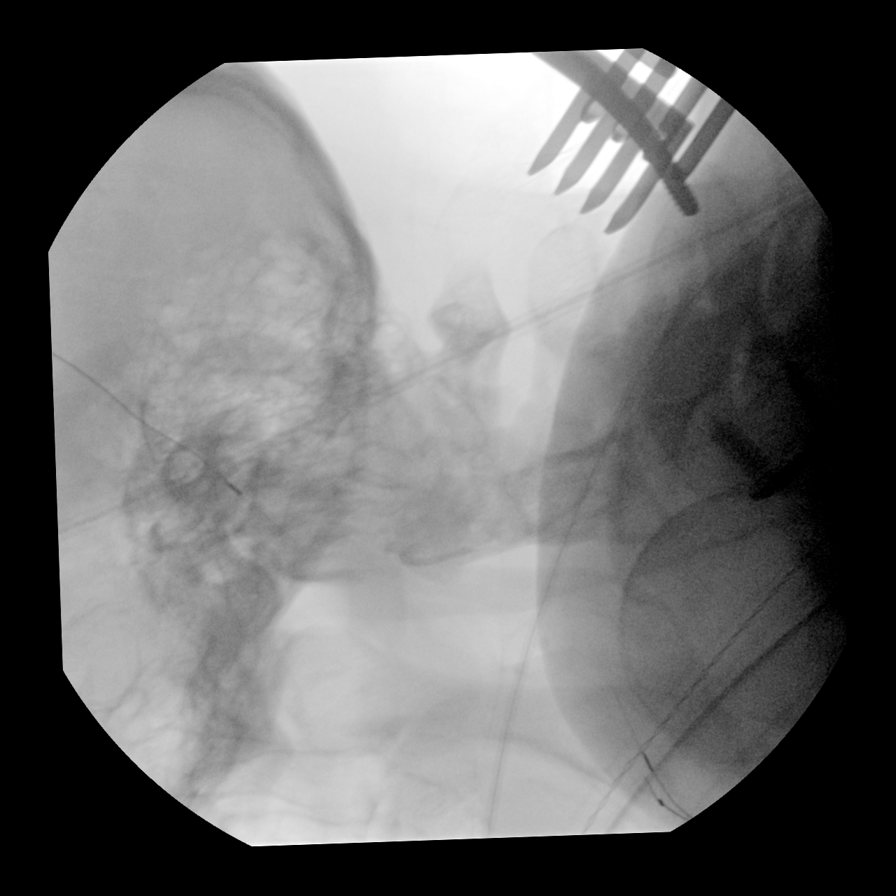
[im 2/6]
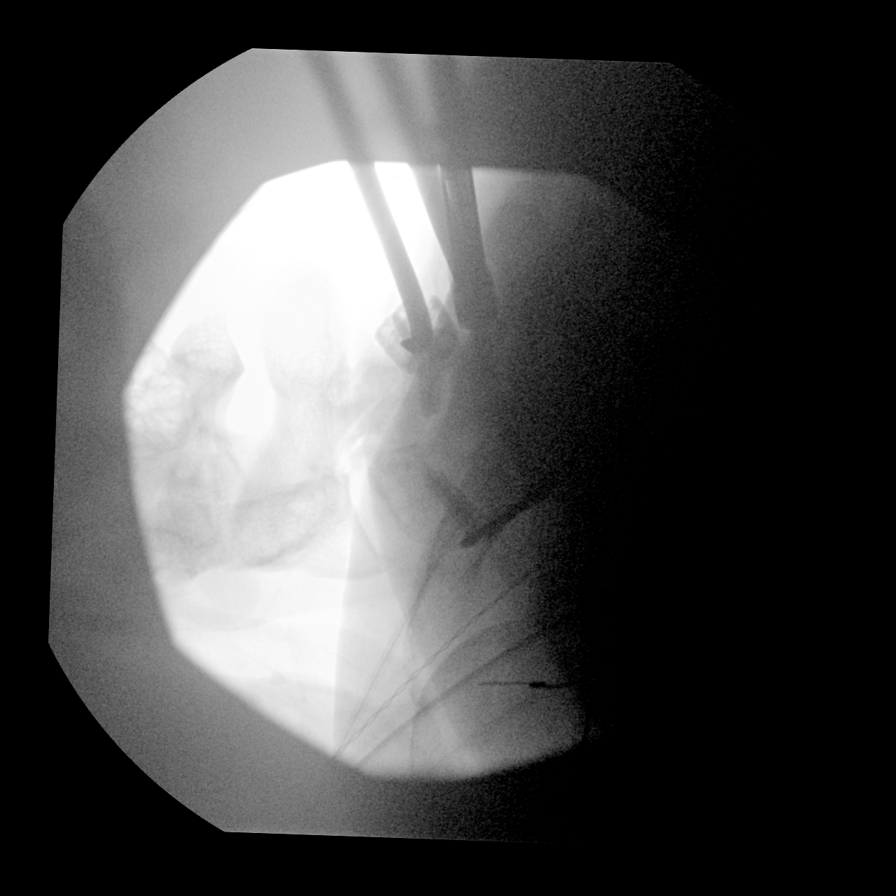
[im 3/6]
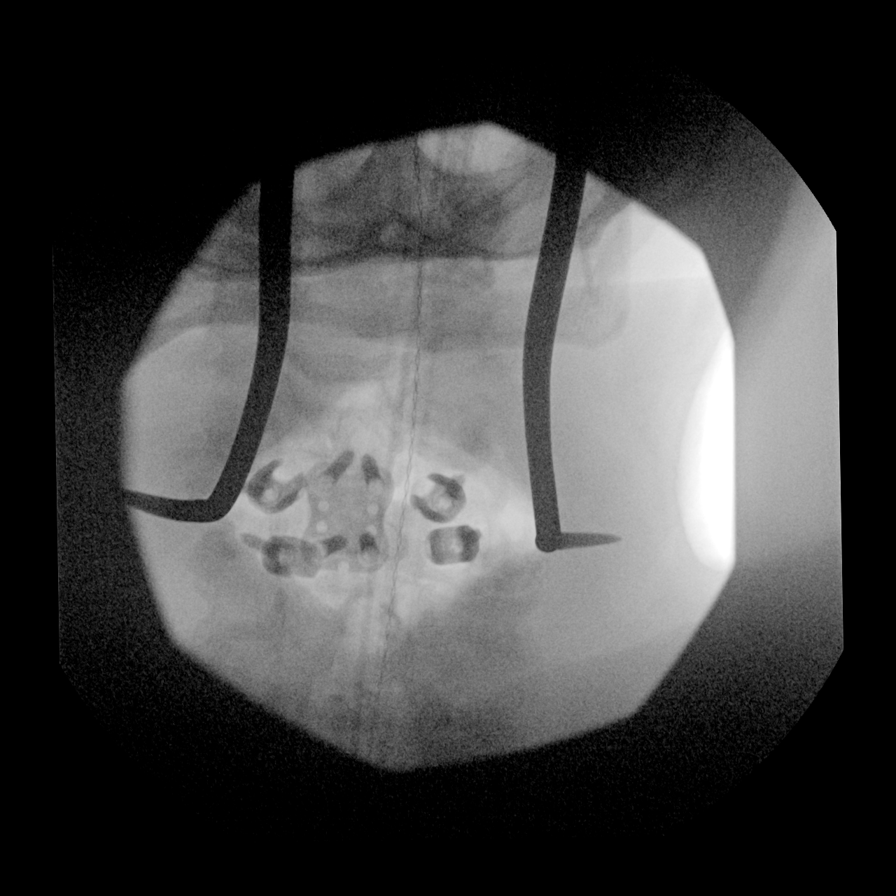
[im 4/6]
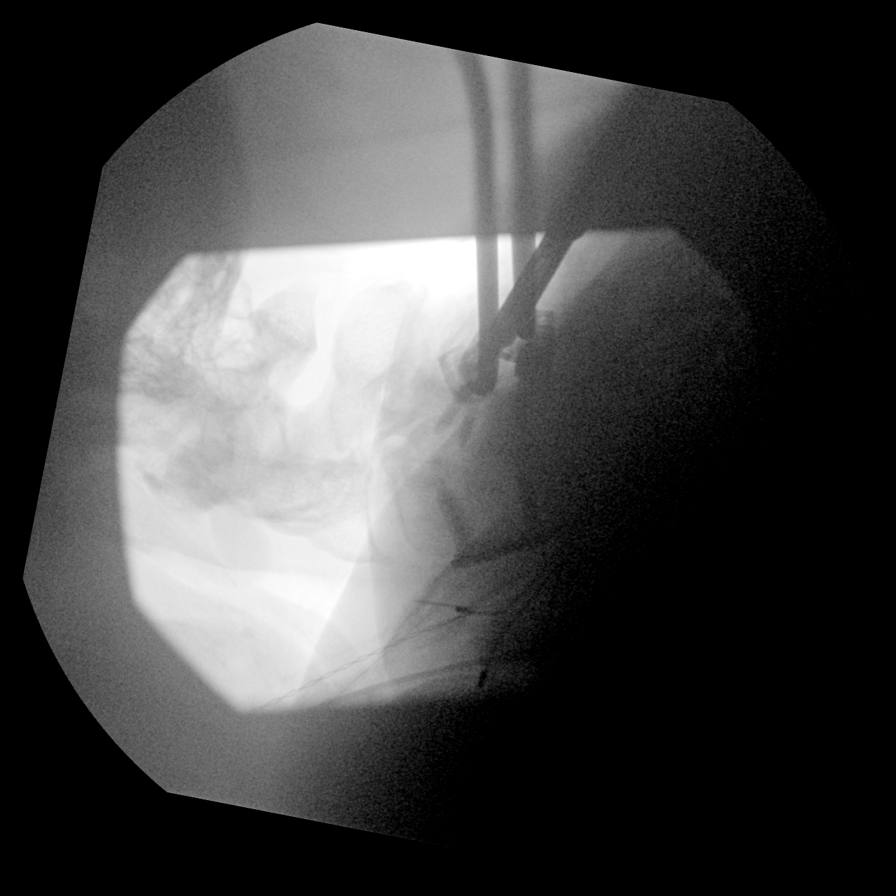
[im 5/6]
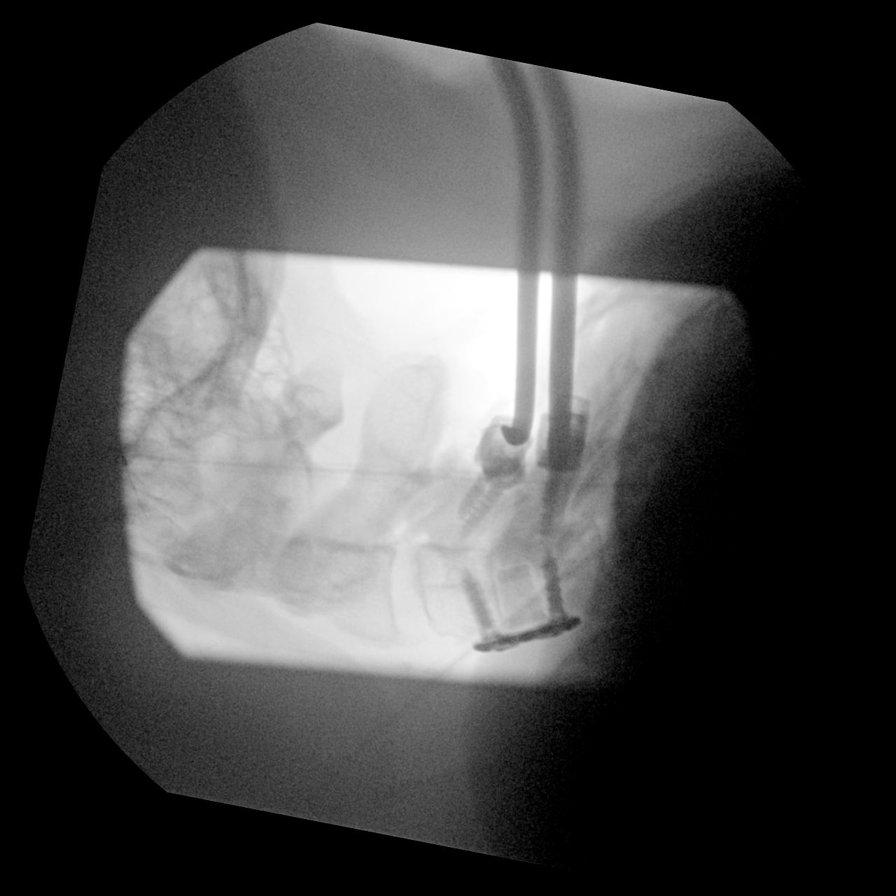
[im 6/6]
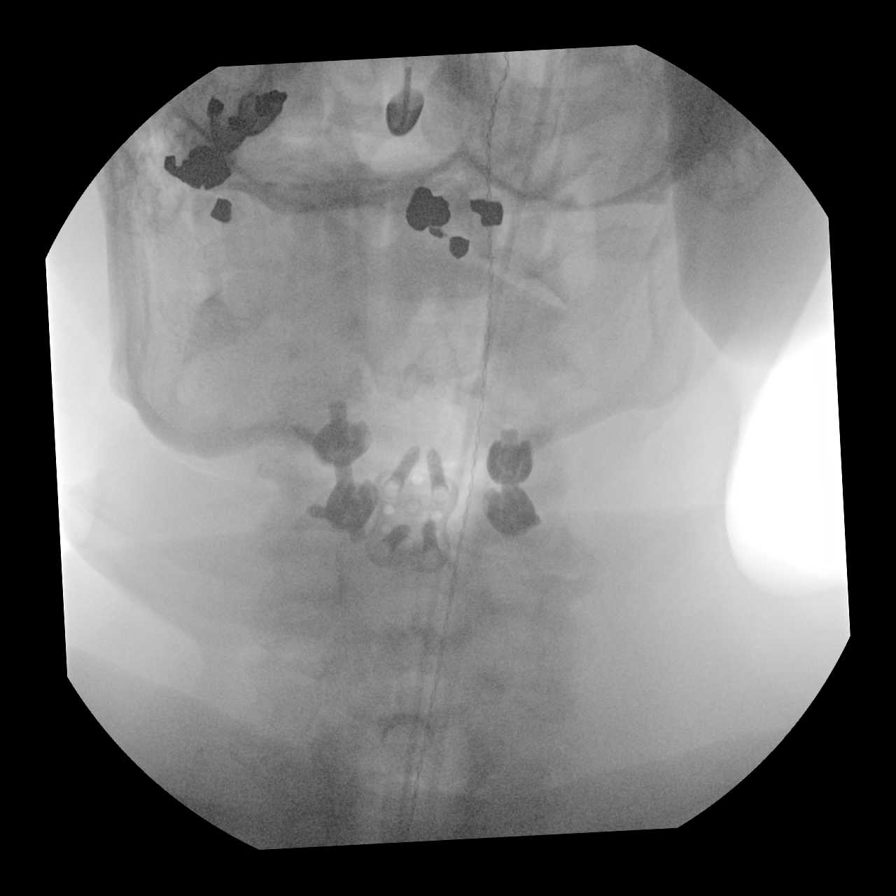

[6 of 6 positions shown; findings below may reference images not displayed]

FINDINGS: Multiple intraoperative spot images demonstrate changes of interval
posterior fusion at C3-4. No visible hardware complicating feature.
Remote changes of anterior fusion at C3-4.
IMPRESSION: Posterior fusion C3-4.  No visible complicating feature.

## 2020-05-06 IMAGING — RF DG C-ARM 1-60 MIN
1 series · 6 of 6 positions shown · non-contrast
Comparison: [DATE]

CLINICAL DATA: C3-4 laminectomy and fusion

EXAM:
CERVICAL SPINE - 2-3 VIEW; DG C-ARM 1-60 MIN

[Series 1: dg x-ray · 0.14mm/px · 6 of 6 slices shown]
[im 1/6]
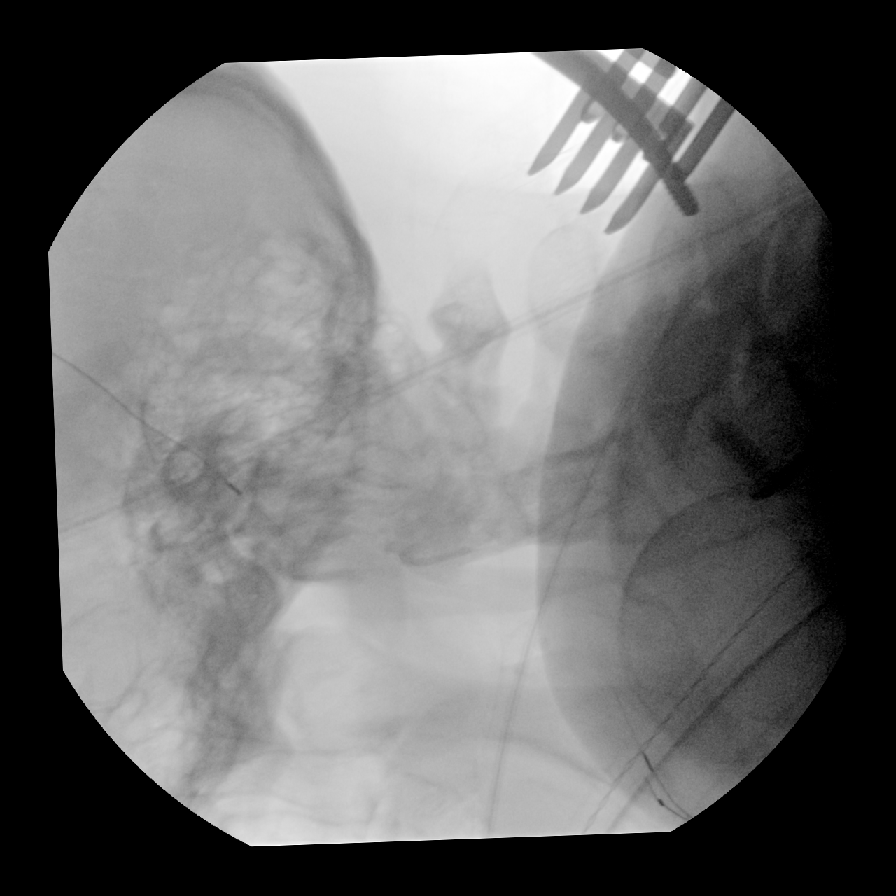
[im 2/6]
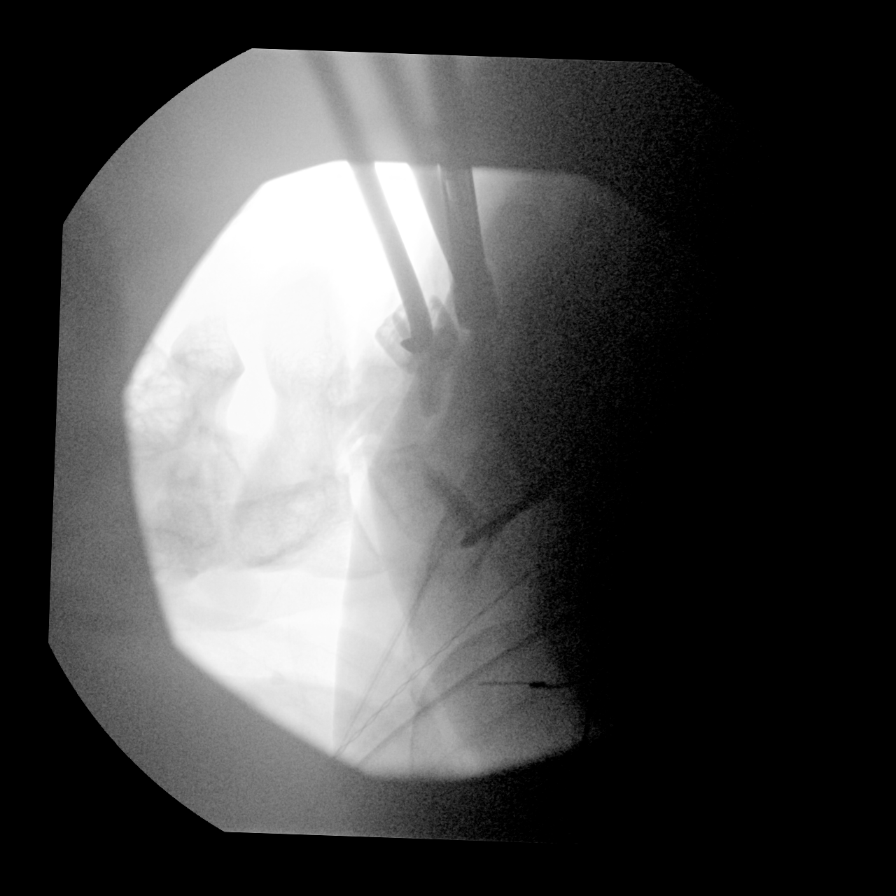
[im 3/6]
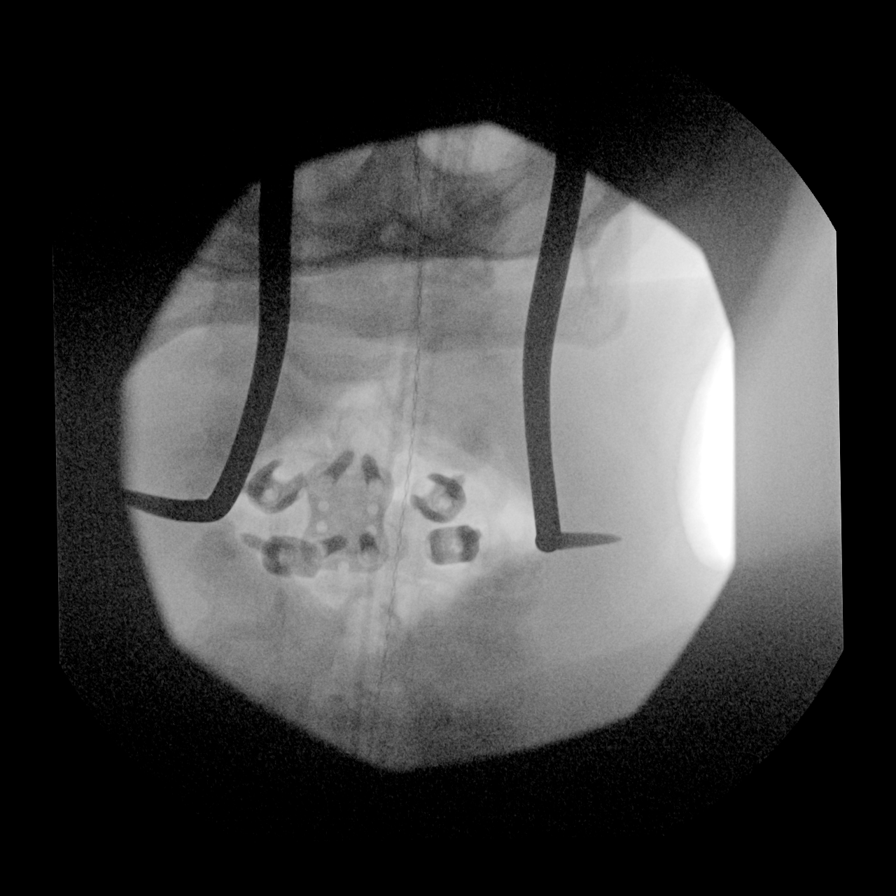
[im 4/6]
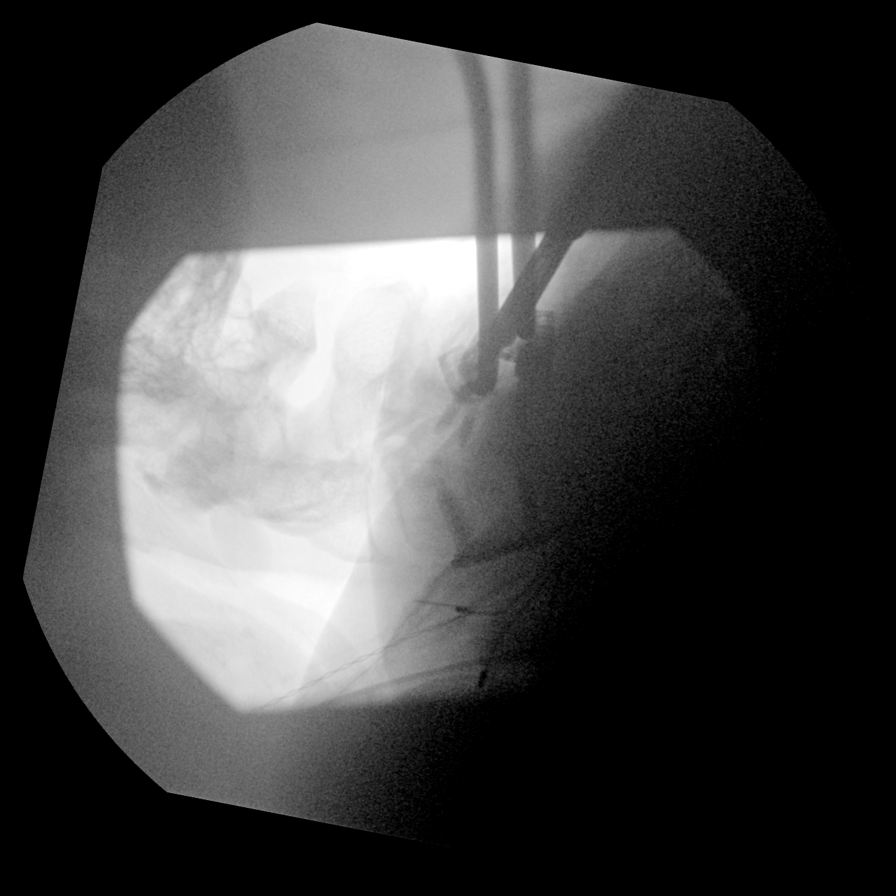
[im 5/6]
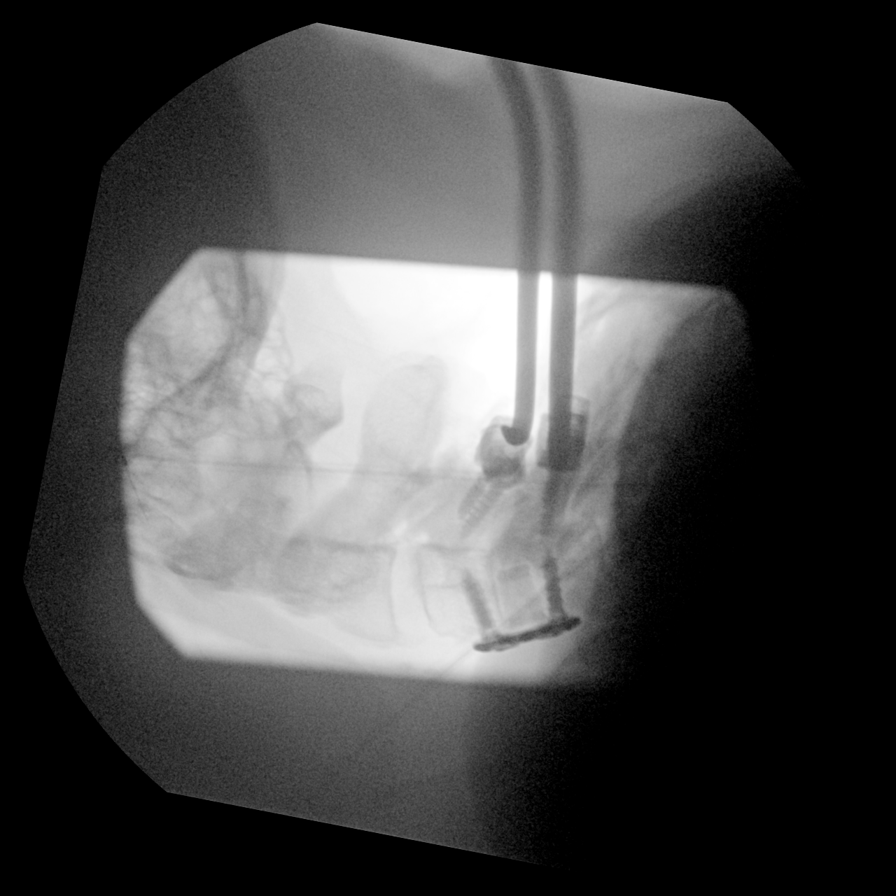
[im 6/6]
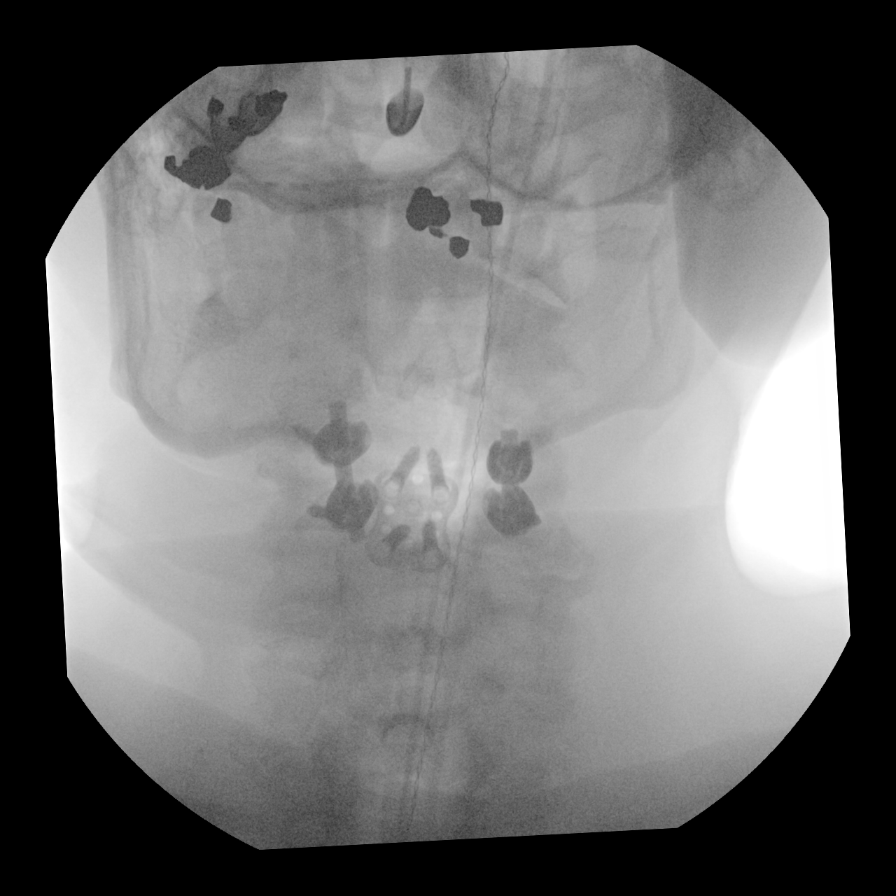

[6 of 6 positions shown; findings below may reference images not displayed]

FINDINGS: Multiple intraoperative spot images demonstrate changes of interval
posterior fusion at C3-4. No visible hardware complicating feature.
Remote changes of anterior fusion at C3-4.
IMPRESSION: Posterior fusion C3-4.  No visible complicating feature.

## 2020-05-06 SURGERY — POSTERIOR CERVICAL FUSION/FORAMINOTOMY LEVEL 1
Anesthesia: General | Site: Neck

## 2020-05-06 MED ORDER — FENTANYL CITRATE (PF) 100 MCG/2ML IJ SOLN
INTRAMUSCULAR | Status: AC
  Filled 2020-05-06: qty 2

## 2020-05-06 MED ORDER — CARVEDILOL 12.5 MG PO TABS
ORAL_TABLET | ORAL | Status: AC
  Administered 2020-05-06: 6.25 mg via ORAL
  Filled 2020-05-06: qty 1

## 2020-05-06 MED ORDER — DEXAMETHASONE SODIUM PHOSPHATE 10 MG/ML IJ SOLN
INTRAMUSCULAR | Status: DC | PRN
  Administered 2020-05-06: 10 mg via INTRAVENOUS

## 2020-05-06 MED ORDER — PROPOFOL 500 MG/50ML IV EMUL
INTRAVENOUS | Status: DC | PRN
  Administered 2020-05-06: 100 ug/kg/min via INTRAVENOUS
  Administered 2020-05-06: 20 ug via INTRAVENOUS
  Administered 2020-05-06: 100 ug via INTRAVENOUS

## 2020-05-06 MED ORDER — ONDANSETRON HCL 4 MG/2ML IJ SOLN: 4.0000 mg | Freq: Once | INTRAMUSCULAR | Status: DC | PRN

## 2020-05-06 MED ORDER — SUCCINYLCHOLINE CHLORIDE 20 MG/ML IJ SOLN
INTRAMUSCULAR | Status: DC | PRN
  Administered 2020-05-06: 120 mg via INTRAVENOUS

## 2020-05-06 MED ORDER — SODIUM CHLORIDE 0.9 % IV SOLN: INTRAVENOUS | Status: DC | PRN

## 2020-05-06 MED ORDER — BRIMONIDINE TARTRATE 0.2 % OP SOLN
1.0000 [drp] | Freq: Two times a day (BID) | OPHTHALMIC | Status: DC
  Administered 2020-05-06 – 2020-05-08 (×5): 1 [drp] via OPHTHALMIC
  Filled 2020-05-06: qty 5

## 2020-05-06 MED ORDER — FENTANYL CITRATE (PF) 100 MCG/2ML IJ SOLN
25.0000 ug | INTRAMUSCULAR | Status: DC | PRN
  Administered 2020-05-06 (×3): 25 ug via INTRAVENOUS

## 2020-05-06 MED ORDER — ENOXAPARIN SODIUM 40 MG/0.4ML ~~LOC~~ SOLN: 40.0000 mg | SUBCUTANEOUS | Status: DC

## 2020-05-06 MED ORDER — MORPHINE SULFATE (PF) 2 MG/ML IV SOLN
INTRAVENOUS | Status: AC
  Filled 2020-05-06: qty 1

## 2020-05-06 MED ORDER — FENTANYL CITRATE (PF) 100 MCG/2ML IJ SOLN
INTRAMUSCULAR | Status: DC | PRN
  Administered 2020-05-06: 100 ug via INTRAVENOUS

## 2020-05-06 MED ORDER — SODIUM CHLORIDE 0.9 % IV SOLN
INTRAVENOUS | Status: DC | PRN
  Administered 2020-05-06: 40 ug/min via INTRAVENOUS

## 2020-05-06 MED ORDER — THROMBIN 5000 UNITS EX SOLR
CUTANEOUS | Status: DC | PRN
  Administered 2020-05-06: 5000 [IU] via TOPICAL

## 2020-05-06 MED ORDER — LIDOCAINE-EPINEPHRINE 1 %-1:100000 IJ SOLN
INTRAMUSCULAR | Status: DC | PRN
  Administered 2020-05-06: 8 mL

## 2020-05-06 MED ORDER — PROPOFOL 500 MG/50ML IV EMUL
INTRAVENOUS | Status: AC
  Filled 2020-05-06: qty 50

## 2020-05-06 MED ORDER — REMIFENTANIL HCL 1 MG IV SOLR
INTRAVENOUS | Status: DC | PRN
  Administered 2020-05-06 (×2): .08 ug/kg/min via INTRAVENOUS
  Administered 2020-05-06: 50 ug via INTRAVENOUS

## 2020-05-06 MED ORDER — PHENYLEPHRINE HCL (PRESSORS) 10 MG/ML IV SOLN
INTRAVENOUS | Status: DC | PRN
  Administered 2020-05-06 (×3): 100 ug via INTRAVENOUS

## 2020-05-06 MED ORDER — BACITRACIN 500 UNIT/GM EX OINT
TOPICAL_OINTMENT | CUTANEOUS | Status: DC | PRN
  Administered 2020-05-06: 1 via TOPICAL

## 2020-05-06 MED ORDER — INSULIN ASPART 100 UNIT/ML ~~LOC~~ SOLN
SUBCUTANEOUS | Status: AC
  Administered 2020-05-06: 2 [IU] via SUBCUTANEOUS
  Filled 2020-05-06: qty 1

## 2020-05-06 MED ORDER — LACTATED RINGERS IV SOLN: INTRAVENOUS | Status: DC | PRN

## 2020-05-06 MED ORDER — SODIUM CHLORIDE (PF) 0.9 % IJ SOLN
INTRAMUSCULAR | Status: AC
  Filled 2020-05-06: qty 10

## 2020-05-06 MED ORDER — FENTANYL CITRATE (PF) 100 MCG/2ML IJ SOLN
INTRAMUSCULAR | Status: AC
  Administered 2020-05-06: 25 ug via INTRAVENOUS
  Filled 2020-05-06: qty 2

## 2020-05-06 MED ORDER — ENOXAPARIN SODIUM 60 MG/0.6ML ~~LOC~~ SOLN
0.5000 mg/kg | SUBCUTANEOUS | Status: DC
  Administered 2020-05-07 – 2020-05-08 (×2): 55 mg via SUBCUTANEOUS
  Filled 2020-05-06 (×2): qty 0.6

## 2020-05-06 MED ORDER — OXYCODONE HCL 5 MG PO TABS
5.0000 mg | ORAL_TABLET | ORAL | Status: DC | PRN
  Administered 2020-05-08: 5 mg via ORAL

## 2020-05-06 MED ORDER — CIPROFLOXACIN HCL 500 MG PO TABS
ORAL_TABLET | ORAL | Status: AC
  Filled 2020-05-06: qty 1

## 2020-05-06 MED ORDER — LIDOCAINE HCL (PF) 2 % IJ SOLN
INTRAMUSCULAR | Status: AC
  Filled 2020-05-06: qty 5

## 2020-05-06 MED ORDER — MELOXICAM 7.5 MG PO TABS
ORAL_TABLET | ORAL | Status: AC
  Filled 2020-05-06: qty 1

## 2020-05-06 MED ORDER — SODIUM CHLORIDE 0.9 % IV SOLN
INTRAVENOUS | Status: DC | PRN
  Administered 2020-05-06: 40 mL

## 2020-05-06 MED ORDER — CEFAZOLIN SODIUM 1 G IJ SOLR
INTRAMUSCULAR | Status: AC
  Filled 2020-05-06: qty 20

## 2020-05-06 MED ORDER — ONDANSETRON HCL 4 MG/2ML IJ SOLN
INTRAMUSCULAR | Status: DC | PRN
  Administered 2020-05-06: 4 mg via INTRAVENOUS

## 2020-05-06 MED ORDER — MORPHINE SULFATE (PF) 4 MG/ML IV SOLN: 2.0000 mg | INTRAVENOUS | Status: DC | PRN

## 2020-05-06 MED ORDER — REMIFENTANIL HCL 1 MG IV SOLR
INTRAVENOUS | Status: AC
  Filled 2020-05-06: qty 1000

## 2020-05-06 MED ORDER — SODIUM CHLORIDE (PF) 0.9 % IJ SOLN
INTRAMUSCULAR | Status: AC
  Filled 2020-05-06: qty 20

## 2020-05-06 MED ORDER — MORPHINE SULFATE (PF) 4 MG/ML IV SOLN
INTRAVENOUS | Status: AC
  Administered 2020-05-06: 2 mg via INTRAVENOUS
  Filled 2020-05-06: qty 1

## 2020-05-06 MED ORDER — LIDOCAINE HCL (CARDIAC) PF 100 MG/5ML IV SOSY
PREFILLED_SYRINGE | INTRAVENOUS | Status: DC | PRN
  Administered 2020-05-06: 100 mg via INTRAVENOUS

## 2020-05-06 MED ORDER — FAMOTIDINE 20 MG PO TABS
ORAL_TABLET | ORAL | Status: AC
  Filled 2020-05-06: qty 1

## 2020-05-06 MED ORDER — INSULIN ASPART 100 UNIT/ML ~~LOC~~ SOLN
1.0000 [IU] | Freq: Three times a day (TID) | SUBCUTANEOUS | Status: DC
  Administered 2020-05-07: 4 [IU] via SUBCUTANEOUS

## 2020-05-06 MED ORDER — GABAPENTIN 300 MG PO CAPS
ORAL_CAPSULE | ORAL | Status: AC
  Administered 2020-05-06: 300 mg via ORAL
  Filled 2020-05-06: qty 1

## 2020-05-06 MED ORDER — BUPIVACAINE HCL (PF) 0.5 % IJ SOLN
INTRAMUSCULAR | Status: DC | PRN
  Administered 2020-05-06: 20 mL

## 2020-05-06 MED ORDER — FENTANYL CITRATE (PF) 250 MCG/5ML IJ SOLN
INTRAMUSCULAR | Status: AC
  Filled 2020-05-06: qty 5

## 2020-05-06 MED ORDER — ACETAMINOPHEN 10 MG/ML IV SOLN
INTRAVENOUS | Status: AC
  Filled 2020-05-06: qty 100

## 2020-05-06 MED ORDER — PROPOFOL 10 MG/ML IV BOLUS
INTRAVENOUS | Status: AC
  Filled 2020-05-06: qty 20

## 2020-05-06 SURGICAL SUPPLY — 70 items
BIT DRILL VUEPOINT II (BIT) IMPLANT
BUR NEURO DRILL SOFT 3.0X3.8M (BURR) ×2 IMPLANT
BUR SABER DIAMOND 3.0 (BURR) ×2 IMPLANT
CANISTER SUCT 1200ML W/VALVE (MISCELLANEOUS) ×2 IMPLANT
CHLORAPREP W/TINT 26 (MISCELLANEOUS) ×4 IMPLANT
COLLAR CERV XL MED DENS 3 (SOFTGOODS) ×1 IMPLANT
COUNTER NEEDLE 20/40 LG (NEEDLE) ×2 IMPLANT
COVER LIGHT HANDLE STERIS (MISCELLANEOUS) ×4 IMPLANT
COVER WAND RF STERILE (DRAPES) IMPLANT
CUP MEDICINE 2OZ PLAST GRAD ST (MISCELLANEOUS) ×4 IMPLANT
DERMABOND ADVANCED (GAUZE/BANDAGES/DRESSINGS) ×1
DERMABOND ADVANCED .7 DNX12 (GAUZE/BANDAGES/DRESSINGS) ×1 IMPLANT
DRAPE C-ARM 42X72 X-RAY (DRAPES) ×4 IMPLANT
DRAPE C-ARMOR (DRAPES) ×2 IMPLANT
DRAPE INCISE IOBAN 66X45 STRL (DRAPES) ×2 IMPLANT
DRAPE MICROSCOPE SPINE 48X150 (DRAPES) IMPLANT
DRAPE THYROID T SHEET (DRAPES) ×2 IMPLANT
DRILL BIT VUEPOINT II (BIT) ×1
DRSG OPSITE POSTOP 4X6 (GAUZE/BANDAGES/DRESSINGS) IMPLANT
DRSG OPSITE POSTOP 4X8 (GAUZE/BANDAGES/DRESSINGS) IMPLANT
DRSG TEGADERM 4X4.75 (GAUZE/BANDAGES/DRESSINGS) ×2 IMPLANT
ELECT CAUTERY BLADE TIP 2.5 (TIP) ×2
ELECTRODE CAUTERY BLDE TIP 2.5 (TIP) ×1 IMPLANT
FEE INTRAOP MONITOR IMPULS NCS (MISCELLANEOUS) IMPLANT
GAUZE SPONGE 4X4 12PLY STRL (GAUZE/BANDAGES/DRESSINGS) ×3 IMPLANT
GAUZE XEROFORM 1X8 LF (GAUZE/BANDAGES/DRESSINGS) ×1 IMPLANT
GLOVE SRG 8 PF TXTR STRL LF DI (GLOVE) ×1 IMPLANT
GLOVE SURG SYN 7.0 (GLOVE) ×4 IMPLANT
GLOVE SURG SYN 7.0 PF PI (GLOVE) ×2 IMPLANT
GLOVE SURG SYN 8.0 (GLOVE) ×4 IMPLANT
GLOVE SURG SYN 8.0 PF PI (GLOVE) ×2 IMPLANT
GLOVE SURG UNDER POLY LF SZ7 (GLOVE) ×2 IMPLANT
GLOVE SURG UNDER POLY LF SZ8 (GLOVE) ×1
GOWN STRL REUS W/ TWL XL LVL3 (GOWN DISPOSABLE) ×2 IMPLANT
GOWN STRL REUS W/TWL XL LVL3 (GOWN DISPOSABLE) ×2
GRADUATE 1200CC STRL 31836 (MISCELLANEOUS) ×2 IMPLANT
HEMOSTAT SURGICEL 2X3 (HEMOSTASIS) IMPLANT
HEMOVAC 400ML (MISCELLANEOUS) ×2
INTRAOP MONITOR FEE IMPULS NCS (MISCELLANEOUS) ×1
INTRAOP MONITOR FEE IMPULSE (MISCELLANEOUS) ×1
IV CATH ANGIO 12GX3 LT BLUE (NEEDLE) IMPLANT
KIT DRAIN HEMOVAC JP 7FR 400ML (MISCELLANEOUS) IMPLANT
KIT TURNOVER KIT A (KITS) ×2 IMPLANT
MANIFOLD NEPTUNE II (INSTRUMENTS) ×2 IMPLANT
MARKER SKIN DUAL TIP RULER LAB (MISCELLANEOUS) ×6 IMPLANT
NEEDLE HYPO 22GX1.5 SAFETY (NEEDLE) ×2 IMPLANT
PACK LAMINECTOMY NEURO (CUSTOM PROCEDURE TRAY) ×2 IMPLANT
PAD ARMBOARD 7.5X6 YLW CONV (MISCELLANEOUS) ×2 IMPLANT
PIN MAYFIELD SKULL DISP (PIN) ×2 IMPLANT
PUTTY DBM PROPEL SM (Putty) ×1 IMPLANT
ROD VUEPOINT 3.5X60 (Rod) ×1 IMPLANT
SCREW MA MM 3.5X12 (Screw) ×4 IMPLANT
SCREW SET THREADED (Screw) ×4 IMPLANT
SCREW VUEPOINT II 3.5X10MM MA (Screw) ×2 IMPLANT
SPOGE SURGIFLO 8M (HEMOSTASIS) ×1
SPONGE SURGIFLO 8M (HEMOSTASIS) ×1 IMPLANT
STAPLER SKIN PROX 35W (STAPLE) ×2 IMPLANT
SUT BONE WAX W31G (SUTURE) ×2 IMPLANT
SUT ETHILON 3 0 PS 1 (SUTURE) ×2 IMPLANT
SUT NURALON 4 0 TR CR/8 (SUTURE) ×2 IMPLANT
SUT NYLON 2-0 (SUTURE) ×2 IMPLANT
SUT POLYSORB 2-0 5X18 GS-10 (SUTURE) ×2 IMPLANT
SUT PROLENE 5 0 RB 2 (SUTURE) IMPLANT
SUT VIC AB 0 CT1 18XCR BRD 8 (SUTURE) ×1 IMPLANT
SUT VIC AB 0 CT1 8-18 (SUTURE) ×1
SYR 30ML LL (SYRINGE) ×2 IMPLANT
TAPE CLOTH 3X10 WHT NS LF (GAUZE/BANDAGES/DRESSINGS) ×2 IMPLANT
TOWEL OR 17X26 4PK STRL BLUE (TOWEL DISPOSABLE) ×4 IMPLANT
TRAY FOLEY MTR SLVR 16FR STAT (SET/KITS/TRAYS/PACK) IMPLANT
TUBING CONNECTING 10 (TUBING) ×2 IMPLANT

## 2020-05-06 NOTE — H&P (View-Only) (Signed)
   Progress Note   Date: 05/06/2020  Subjective: [name] looks to be at baseline today. His strength remains weak in UE and strong in LE. He is currently NPO for surgery today.  Vital Signs: Temp:  [97.1 F (36.2 C)-98.2 F (36.8 C)] 98 F (36.7 C) (01/25 0311) Pulse Rate:  [72-99] 78 (01/25 0311) Resp:  [8-19] 18 (01/25 0311) BP: (75-146)/(55-94) 133/78 (01/25 0842) SpO2:  [95 %-100 %] 95 % (01/25 0311) Arterial Line BP: (90-161)/(57-107) 90/57 (01/24 2311) Weight:  [110.2 kg] 110.2 kg (01/24 1840) Temp (24hrs), Avg:97.8 F (36.6 C), Min:97.1 F (36.2 C), Max:98.2 F (36.8 C)  Weight: 110.2 kg   Problem List Patient Active Problem List   Diagnosis Date Noted  . Cervical myelopathy (HCC) 05/05/2020  . Post-operative state 01/31/2020  . Hypertension   . Diabetes mellitus without complication (HCC)   . HLD (hyperlipidemia)   . Chronic diastolic CHF (congestive heart failure) (HCC)   . S/P cervical spinal fusion 01/29/2020  . Weakness   . Spinal stenosis of lumbar region   . History of progressive weakness   . Claudication (HCC)   . Lower extremity weakness 01/14/2020    Medications: Scheduled Meds: . baclofen  5 mg Oral TID  . brimonidine  1 drop Right Eye BID  . carvedilol  6.25 mg Oral BID  . ciprofloxacin      . ciprofloxacin  500 mg Oral BID  . famotidine  20 mg Oral QPM  . gabapentin  300 mg Oral QHS  . insulin aspart  1 Units Subcutaneous TID WC  . insulin detemir  12 Units Subcutaneous QHS  . latanoprost  1 drop Right Eye QHS  . lisinopril  10 mg Oral BH-q7a  . meloxicam  7.5 mg Oral Daily  . multivitamin with minerals  1 tablet Oral Daily  . simvastatin  20 mg Oral q1800  . tamsulosin  0.4 mg Oral QPC supper  . cyanocobalamin  2,000 mcg Oral Daily   Continuous Infusions: .  ceFAZolin (ANCEF) IV    . dextrose 5 % and 0.45 % NaCl with KCl 20 mEq/L 75 mL/hr at 05/05/20 2330   PRN Meds:.acetaminophen **OR** acetaminophen, ondansetron **OR**  ondansetron (ZOFRAN) IV, polyethylene glycol  Labs:  Lab Results  Component Value Date   WBC 7.2 05/01/2020   WBC 7.8 01/25/2020   HCT 44.0 05/01/2020   HCT 40.4 01/25/2020   PLT 232 05/01/2020   PLT 309 01/25/2020    Lab Results  Component Value Date   INR 1.0 05/01/2020   APTT 31 05/01/2020   Lab Results  Component Value Date   NA 142 05/01/2020   NA 138 01/22/2020   K 4.1 05/01/2020   K 3.8 01/22/2020   BUN 25 (H) 05/01/2020   BUN 29 (H) 01/22/2020    Lab Results  Component Value Date   MG 1.7 01/22/2020   Exam:  General appearance: Alert, cooperative, in no acute distress Neck: Well-healed anterior incision CV: Regular rate and rhythm Pulm: Clear to auscultation  Neurologic exam:  Mental status: alertness: alert, affect: normal Speech: fluent and clear Motor:strength is 4- out of 5 diffusely and bilateral deltoid, bicep, tricep, 3/5 in grip, he is 4+ out of 5 strength in bilateral hip flexion, dorsiflexion, plantarflexion Sensory: Decreased light touch in bilateral hands and feet Gait: Not tested, arrives in wheelchair  A/P:  Will proceed with C3/4 laminectomy and fusion today  [name], MD

## 2020-05-06 NOTE — Anesthesia Preprocedure Evaluation (Signed)
Anesthesia Evaluation  Patient identified by MRN, date of birth, ID band Patient awake  General Assessment Comment:bedbound patient, foley in situ Prior difficult arterial line placement  Reviewed: Allergy & Precautions, NPO status , Patient's Chart, lab work & pertinent test results  History of Anesthesia Complications Negative for: history of anesthetic complications  Airway Mallampati: II  TM Distance: >3 FB Neck ROM: Full    Dental  (+) Teeth Intact, Poor Dentition   Pulmonary sleep apnea , neg COPD, Patient abstained from smoking.Not current smoker,    Pulmonary exam normal breath sounds clear to auscultation       Cardiovascular Exercise Tolerance: Good METShypertension, + CAD and +CHF  (-) Past MI (-) dysrhythmias  Rhythm:Regular Rate:Normal - Systolic murmurs TTE 10/21: ECHOCARDIOGRAM done on10/08/2019 1. Left ventricular ejection fraction, by estimation, is 60 to 65%. The left ventricle has normal function.  2. The left ventricle has no regional wall motion abnormalities.  3. There is mild left ventricular hypertrophy.  4. Left ventricular diastolic parameters are consistent with Grade I diastolic dysfunction (impaired relaxation).  5. Right ventricular systolic function is normal. The right ventricular size is normal.  6. The mitral valve is normal in structure. Trivial mitral valve regurgitation. No evidence of mitral stenosis.  7. The aortic valve was not well visualized. Aortic valve regurgitation is not visualized. No aortic stenosis is present.  8. Aortic dilatation noted. There is borderline dilatation of the aortic root, measuring 38 mm.  9. The inferior vena cava is normal in size with greater than 50% respiratory variability, suggesting right atrial pressure of 3 mmHg.    Neuro/Psych negative neurological ROS  negative psych ROS   GI/Hepatic GERD  Controlled,(+)     (-) substance abuse  ,   Endo/Other   diabetes  Renal/GU negative Renal ROS     Musculoskeletal  (+) Arthritis , Osteoarthritis,    Abdominal   Peds  Hematology   Anesthesia Other Findings Past Medical History: No date: Bedbound No date: Bilateral shoulder pain No date: CHF (congestive heart failure) (HCC) No date: Coronary artery disease No date: Diabetes mellitus without complication (HCC) No date: GERD (gastroesophageal reflux disease) No date: Glaucoma No date: HLD (hyperlipidemia) No date: Hypertension No date: Lumbar stenosis No date: Non-ischemic cardiomyopathy (HCC) No date: PVC (premature ventricular contraction) No date: Sleep apnea     Comment:  H/O NO CPAP IN 35 YRS  Reproductive/Obstetrics                             Anesthesia Physical  Anesthesia Plan  ASA: III  Anesthesia Plan: General   Post-op Pain Management:    Induction: Intravenous  PONV Risk Score and Plan: 2 and Ondansetron, Dexamethasone and Treatment may vary due to age or medical condition  Airway Management Planned: Oral ETT  Additional Equipment: Arterial line  Intra-op Plan:   Post-operative Plan: Extubation in OR  Informed Consent: I have reviewed the patients History and Physical, chart, labs and discussed the procedure including the risks, benefits and alternatives for the proposed anesthesia with the patient or authorized representative who has indicated his/her understanding and acceptance.     Dental advisory given  Plan Discussed with: CRNA and Surgeon  Anesthesia Plan Comments: (Discussed risks of anesthesia with patient, including PONV, sore throat, lip/dental damage. Rare risks discussed as well, such as cardiorespiratory and neurological sequelae. Patient understands.)        Anesthesia Quick Evaluation

## 2020-05-06 NOTE — Transfer of Care (Signed)
Immediate Anesthesia Transfer of Care Note  Patient: Roy [name].  Procedure(s) Performed: C3-4 LAMINECTOMY & C3-4 FUSION (N/A Neck)  Patient Location: PACU  Anesthesia Type:General  Level of Consciousness: awake, alert , oriented and patient cooperative  Airway & Oxygen Therapy: Patient Spontanous Breathing and Patient connected to face mask oxygen  Post-op Assessment: Report given to RN and Post -op Vital signs reviewed and stable  Post vital signs: Reviewed and stable  Last Vitals:  Vitals Value Taken Time  BP 117/82 05/06/20 1923  Temp 36.3 C 05/06/20 1923  Pulse 69 05/06/20 1928  Resp 11 05/06/20 1928  SpO2 100 % 05/06/20 1928  Vitals shown include unvalidated device data.  Last Pain:  Vitals:   05/06/20 1923  TempSrc:   PainSc: Asleep         Complications: No complications documented.

## 2020-05-06 NOTE — Progress Notes (Signed)
Nutrition Brief Note  Patient identified on the Malnutrition Screening Tool (MST) Report  Wt Readings from Last 15 Encounters:  05/05/20 110.2 kg  01/29/20 115.5 kg  01/25/20 115.5 kg  04/09/15 (!) 40.83 kg   75 year old male with PMHx of DM, HTN, HLD, glaucoma, CAD, CHF, sleep apnea, GERD, bilateral shoulder pain, lumbar stenosis, cervical myopathy s/p C3-4 anterior cervical discectomy/decompression and fusion on 01/29/2020 now admitted with residual stenosis at C3/4 with plan for C3/4 Laminectomy and Fusion today.  Met with patient and his wife at bedside in pre-op 12 (surge periop). Patient is currently NPO for planned surgery this afternoon. Yesterday patient ate 85% of his dinner. Patient's wife reports he has a good appetite and intake at home. He typically eats 3 meals per day and occasionally a snack. For breakfast he may have protein bar with 1/2 banana or applesauce, eggs with sausage and cheese, or occasionally raisin bran cereal. For lunch and dinner he may have meatloaf, steak, pork chops, or chicken with sides. Patient enjoys salad. Wife reports they follow carbohydrate-modified diet for DM. She reports his HgbA1c got too low per MD and their MD wants his HgbA1c to be a little higher at round 7-7.5 (last result was 5.8 on 01/16/2020). He screened for MST due to reported weight loss. Wife reports patient was 320 lbs in October of 2020 and lost weight slowly over time since then. Patient's last known weight was 243 lbs after finishing rehab. That is a weight loss of approximately 77 lbs (24% body weight) over 1 year and almost 4 months, which is not significant for time frame. Wife also reports true weight has not been checked recently. Patient could weigh more than this now. RD completed nutrition-focused physical exam. Only noted mild muscle depletion at clavicle/acromion bone region, but wife reports patient had difficulty with shoulder mobility and has been working with PT so this explains  muscle depletion in that area. No other depletions noted. Patient does not meet criteria for malnutrition at this time.   Body mass index is 31.2 kg/m. Patient meets criteria for obesity class I based on current BMI.   Current diet order is NPO for planned surgery today. Yesterday when patient was on carbohydrate modified diet he ate 85% of his dinner (this appears to be the only meal he had at the hospital yesterday as he arrived into surge periop area at 57). Labs and medications reviewed.   No nutrition interventions warranted at this time. If nutrition issues arise, please consult RD.   Jacklynn Barnacle, MS, RD, LDN Pager number available on Amion

## 2020-05-06 NOTE — Anesthesia Procedure Notes (Signed)
Procedure Name: Intubation Date/Time: 05/06/2020 4:11 PM Performed by: Iran Planas, CRNA Pre-anesthesia Checklist: Patient identified, Emergency Drugs available, Suction available, Patient being monitored and Timeout performed Patient Re-evaluated:Patient Re-evaluated prior to induction Oxygen Delivery Method: Circle system utilized Preoxygenation: Pre-oxygenation with 100% oxygen Induction Type: IV induction Ventilation: Mask ventilation without difficulty Laryngoscope Size: McGraph and 4 Grade View: Grade I Tube size: 7.5 mm Number of attempts: 1 Placement Confirmation: ETT inserted through vocal cords under direct vision,  positive ETCO2 and breath sounds checked- equal and bilateral Secured at: 22 cm Tube secured with: Tape Dental Injury: Teeth and Oropharynx as per pre-operative assessment

## 2020-05-06 NOTE — Progress Notes (Signed)
PHARMACIST - PHYSICIAN COMMUNICATION  CONCERNING:  Enoxaparin (Lovenox) for DVT Prophylaxis    RECOMMENDATION: Patient was prescribed enoxaprin 40mg  q24 hours for VTE prophylaxis.   Filed Weights   05/05/20 1840  Weight: 110.2 kg (243 lb)    Body mass index is 31.2 kg/m.  Estimated Creatinine Clearance: 107 mL/min (by C-G formula based on SCr of 0.79 mg/dL).   Based on [facility] policy patient is candidate for enoxaparin 0.5mg /kg TBW SQ every 24 hours based on BMI being >30.  DESCRIPTION: Pharmacy has adjusted enoxaparin dose per [facility] policy.  Patient is now receiving enoxaparin 0.5 mg/kg every 24 hours   [facility], PharmD, [facility] 05/06/2020 10:07 PM

## 2020-05-06 NOTE — Anesthesia Postprocedure Evaluation (Signed)
Anesthesia Post Note  Patient: Roy [name].  Procedure(s) Performed: C3-4 LAMINECTOMY & C3-4 FUSION (N/A Neck)  Patient location during evaluation: PACU Anesthesia Type: General Level of consciousness: awake and alert Pain management: pain level controlled Vital Signs Assessment: post-procedure vital signs reviewed and stable Respiratory status: spontaneous breathing, nonlabored ventilation and respiratory function stable Cardiovascular status: blood pressure returned to baseline and stable Postop Assessment: no apparent nausea or vomiting Anesthetic complications: no   No complications documented.   Last Vitals:  Vitals:   05/06/20 1938 05/06/20 1952  BP: 134/77   Pulse: 66 64  Resp: 14   Temp:    SpO2: 96% 97%    Last Pain:  Vitals:   05/06/20 1952  TempSrc:   PainSc: 8                  Karleen Hampshire

## 2020-05-06 NOTE — Op Note (Signed)
Operative Note  SURGERY DATE:05/06/2020  PRE-OP DIAGNOSIS: Cervical myelopathy  POST-OP DIAGNOSIS:Post-Op Diagnosis Codes: Cervical myelopathy  Procedure(s) with comments: C3-C4 lateral mass fusion, C3 and C4 laminectomy  SURGEON:  * Nathaniel Man, MD  ANESTHESIA:General  OPERATIVE FINDINGS:Successful placement of hardware posteriorly in the cervical spine  PROCEDURE  Indications  [name] was seen in clinic recently for ongoing symptoms of myelopathy.  He had had a previous C3-4 ACDF last year and MRI was obtained which showed residual stenosis at the C3-4 level.  Given this, we did discuss posterior decompression and fusion to help relieve the pressure on the spinal cord.    He was taken to the operating room on 1/24 but unfortunately neuro monitoring was unable to obtain appropriate monitoring potentials.  Therefore the case was aborted and he now returns for surgery on 1/25 for the definitive procedure. The risks of hematoma, infection, poor bone healing, failure of fusion, cord injury, weakness, numbness, neck pain, stroke, and death were discussed in detail. All questions were answered and the patient elected to proceed with the surgery.   Procedure After obtaining informed consent, the patient was taken to the Operating Room where general anesthesia was induced and the patient intubated. Vascular access was obtained. Neuromonitoring electrodes had been placed and adequate baselines for MEP and SSEP obtained before and after position in prone.  They remained stable throughout this. The Mayfield pins were applied, and the patient was positioned prone with neck in a neutral position with slight extension on a standard OR table with appropriate padding of pressure points.   The patient was prepped and draped in the usual sterile fashion and a timeout was performed per protocol. Local anesthesia was instilled with epinephrine along the planned incision site. A  midline incision was performed from C C2-C5. The incision was carried to the level of the fascia and then to the lamina from C3-C4 taking care not to disrupt the musculature and ligament adjacent to these levels. The lateral masses of C3-C4 were cleared of all tissue.   We then proceeded with placement of the C3 ,C4 lateral mass screws. The midpoint of the lateral mass of C3-4 was established and a cortical entry site was made with a 3 mm matchstick drill bit. A drill guide was set 53mm and advanced to the superior lateral quadrant. A ball-tipped probe was passed and had no evidence of breech. A 3-0 tap was advanced and subsequently 3.5 x 7mm Depuy Symphony lateral mass screws were positioned until they were noted to be hand tight at C3 and at C4.  No monitoring changes were noted once this portion of the procedure was completed. Xray confirmed good placement of screws.  Next, we turned to the decompression.  The drill was used to remove the lamina bilaterally at C3 and C4.  The spinous processes were then used to lift the lamina away from the dura where a rongeur was used to remove the remaining ligament.  The ligament and lamina were removed en bloc.  Rondure was used to decompress laterally removing the medial facet edges and ligament.  At this point, the dura appeared well decompressed.  Monitoring remained stable.  Set screws and rods were placed and final tightened.  Final x-rays were obtained  The wound was thoroughly irrigated prior to arthrodesis. A 3 mm matchstick drill bit was then used to decorticate all lateral masses and facet joints spanning C3-C4 to remote arthrodesis. Components of allograft and packed within each facet joint and  lateral gutters.  Vancomycin powder was placed.  A medium hemovac was tunneled inferiorly through the skin. The muscle and fascia were closed with 0 vicryl suture. Anesthetic was injected into fascia. The subcutaneous and dermis was closed with 2-0 vicryl suture.  The skin was closed with 3-0 nylon  The patient was returned to supine position and head fixation removed. The patient had general anesthesia reversed and was extubated following the procedure. He was taken to the PACU where he continued his recovery. Following the procedure, I spoke with the patient's family about the procedure and answered all questions.     ESTIMATED BLOOD LOSS: 200cc  SPECIMENS None  IMPLANT PUTTY PROPEL SMALL - SN/A  Inventory Item: PUTTY PROPEL SMALL Serial no.: N/A Model/Cat no.: [id number]  Implant name: PUTTY PROPEL SMALL - SN/A Laterality: N/A Area: Cervical Level 3-4  Manufacturer: [employer] Date of Manufacture:    Action: Implanted Number Used: 1   Device Identifier:  Device Identifier Type:     SCREW VUEPOINT II 3.5X10MM MA - SN/A  Inventory Item: SCREW VUEPOINT II 3.5X10MM MA Serial no.: N/A Model/Cat no.: [id number]  Implant name: SCREW VUEPOINT II 3.5X10MM MA - SN/A Laterality: N/A Area: Cervical Level 3-4  Manufacturer: [employer] Date of Manufacture:    Action: Implanted Number Used: 2   Device Identifier:  Device Identifier Type:     SCREW MA MM 3.5X12 - SN/A  Inventory Item: SCREW MA MM 3.5X12 Serial no.: N/A Model/Cat no.: [id number]  Implant name: SCREW MA MM 3.5X12 - SN/A Laterality: N/A Area: Cervical Level 3-4  Manufacturer: [employer] Date of Manufacture:    Action: Implanted Number Used: 4   Device Identifier:  Device Identifier Type:     SCREW SET THREADED - SN/A  Inventory Item: SCREW SET THREADED Serial no.: N/A Model/Cat no.: [id number]  Implant name: SCREW SET THREADED - SN/A Laterality: N/A Area: Cervical Level 3-4  Manufacturer: [employer] Date of Manufacture:    Action: Implanted Number Used: 4   Device Identifier:  Device Identifier Type:     ROD VUEPOINT 3.5X60 - SN/A  Inventory Item: ROD VUEPOINT 3.5X60 Serial no.: N/A Model/Cat no.: [id number]  Implant name: ROD VUEPOINT 3.5X60 - SN/A Laterality: N/A Area:  Cervical Level 3-4  Manufacturer: [employer] Date of Manufacture:    Action: Implanted Number Used: 1   Device Identifier:  Device Identifier Type:        I performed the case in its entiretywith Mr [place], [place]  [name], MD [phone]

## 2020-05-06 NOTE — Interval H&P Note (Signed)
History and Physical Interval Note:  05/06/2020 8:47 AM  Roy [name].  has presented today for surgery, with the diagnosis of cervical myelopathy.  The various methods of treatment have been discussed with the patient and family. After consideration of risks, benefits and other options for treatment, the patient has consented to  Procedure(s): C3-4 LAMINECTOMY & C3-4 FUSION (N/A) as a surgical intervention.  The patient's history has been reviewed, patient examined, no change in status, stable for surgery.  I have reviewed the patient's chart and labs.  Questions were answered to the patient's satisfaction.     [name]

## 2020-05-06 NOTE — Progress Notes (Signed)
   Progress Note   Date: 05/06/2020  Subjective: Mr Nass looks to be at baseline today. His strength remains weak in UE and strong in LE. He is currently NPO for surgery today.  Vital Signs: Temp:  [97.1 F (36.2 C)-98.2 F (36.8 C)] 98 F (36.7 C) (01/25 0311) Pulse Rate:  [72-99] 78 (01/25 0311) Resp:  [8-19] 18 (01/25 0311) BP: (75-146)/(55-94) 133/78 (01/25 0842) SpO2:  [95 %-100 %] 95 % (01/25 0311) Arterial Line BP: (90-161)/(57-107) 90/57 (01/24 2311) Weight:  [110.2 kg] 110.2 kg (01/24 1840) Temp (24hrs), Avg:97.8 F (36.6 C), Min:97.1 F (36.2 C), Max:98.2 F (36.8 C)  Weight: 110.2 kg   Problem List Patient Active Problem List   Diagnosis Date Noted  . Cervical myelopathy (HCC) 05/05/2020  . Post-operative state 01/31/2020  . Hypertension   . Diabetes mellitus without complication (HCC)   . HLD (hyperlipidemia)   . Chronic diastolic CHF (congestive heart failure) (HCC)   . S/P cervical spinal fusion 01/29/2020  . Weakness   . Spinal stenosis of lumbar region   . History of progressive weakness   . Claudication (HCC)   . Lower extremity weakness 01/14/2020    Medications: Scheduled Meds: . baclofen  5 mg Oral TID  . brimonidine  1 drop Right Eye BID  . carvedilol  6.25 mg Oral BID  . ciprofloxacin      . ciprofloxacin  500 mg Oral BID  . famotidine  20 mg Oral QPM  . gabapentin  300 mg Oral QHS  . insulin aspart  1 Units Subcutaneous TID WC  . insulin detemir  12 Units Subcutaneous QHS  . latanoprost  1 drop Right Eye QHS  . lisinopril  10 mg Oral BH-q7a  . meloxicam  7.5 mg Oral Daily  . multivitamin with minerals  1 tablet Oral Daily  . simvastatin  20 mg Oral q1800  . tamsulosin  0.4 mg Oral QPC supper  . cyanocobalamin  2,000 mcg Oral Daily   Continuous Infusions: .  ceFAZolin (ANCEF) IV    . dextrose 5 % and 0.45 % NaCl with KCl 20 mEq/L 75 mL/hr at 05/05/20 2330   PRN Meds:.acetaminophen **OR** acetaminophen, ondansetron **OR**  ondansetron (ZOFRAN) IV, polyethylene glycol  Labs:  Lab Results  Component Value Date   WBC 7.2 05/01/2020   WBC 7.8 01/25/2020   HCT 44.0 05/01/2020   HCT 40.4 01/25/2020   PLT 232 05/01/2020   PLT 309 01/25/2020    Lab Results  Component Value Date   INR 1.0 05/01/2020   APTT 31 05/01/2020   Lab Results  Component Value Date   NA 142 05/01/2020   NA 138 01/22/2020   K 4.1 05/01/2020   K 3.8 01/22/2020   BUN 25 (H) 05/01/2020   BUN 29 (H) 01/22/2020    Lab Results  Component Value Date   MG 1.7 01/22/2020   Exam:  General appearance: Alert, cooperative, in no acute distress Neck: Well-healed anterior incision CV: Regular rate and rhythm Pulm: Clear to auscultation  Neurologic exam:  Mental status: alertness: alert, affect: normal Speech: fluent and clear Motor:strength is 4- out of 5 diffusely and bilateral deltoid, bicep, tricep, 3/5 in grip, he is 4+ out of 5 strength in bilateral hip flexion, dorsiflexion, plantarflexion Sensory: Decreased light touch in bilateral hands and feet Gait: Not tested, arrives in wheelchair  A/P:  Will proceed with C3/4 laminectomy and fusion today  Korrine Sicard, MD   

## 2020-05-06 NOTE — Op Note (Signed)
Operative Note  SURGERY DATE:05/06/2020  PRE-OP DIAGNOSIS: Cervical myelopathy  POST-OP DIAGNOSIS:Post-Op Diagnosis Codes: Cervical myelopathy  Procedure(s) with comments: Procedure aborted(planned C3-4 laminectomy and fusion)  SURGEON:  * Nathaniel Man, MD   ANESTHESIA:General  OPERATIVE FINDINGS: The patient was brought into the operating room where general anesthesia was delivered.  Neuro monitoring electrodes have been placed for MEP's and SSEPs.  The Mayfield head holder was placed.  The Foley catheter that was indwelling was replaced by the nursing staff.  Vascular access was obtained.  The patient was then turned prone onto the operating table and positioned for the posterior cervical surgery.  Once he was secured, MEP's and SSEPs were obtained.  These showed signal only in the right upper extremity.  The head and neck was repositioned.  Blood pressure was checked.  However, the monitoring remained stable without any significant signals.  Therefore the patient was returned to a supine position at this time, the motor signals were seen to decrease even more.  Given this, the head frame was removed.  The patient was awakened from anesthesia.  The procedure was aborted.  The patient was taken to the PACU for further monitoring.  There, he was seen to be at his neurologic baseline.    [name], MD [phone]

## 2020-05-07 ENCOUNTER — Encounter: Payer: Self-pay | Admitting: Neurosurgery

## 2020-05-07 ENCOUNTER — Inpatient Hospital Stay: Payer: Medicare HMO

## 2020-05-07 LAB — BASIC METABOLIC PANEL
Anion gap: 8 (ref 5–15)
BUN: 10 mg/dL (ref 8–23)
CO2: 27 mmol/L (ref 22–32)
Calcium: 8.8 mg/dL — ABNORMAL LOW (ref 8.9–10.3)
Chloride: 106 mmol/L (ref 98–111)
Creatinine, Ser: 0.77 mg/dL (ref 0.61–1.24)
GFR, Estimated: >60 mL/min (ref >60)
Glucose, Bld: 96 mg/dL (ref 70–99)
Potassium: 4.4 mmol/L (ref 3.5–5.1)
Sodium: 141 mmol/L (ref 135–145)

## 2020-05-07 LAB — CBC
HCT: 36 % — ABNORMAL LOW (ref 39.0–52.0)
Hemoglobin: 11.9 g/dL — ABNORMAL LOW (ref 13.0–17.0)
MCH: 31.2 pg (ref 26.0–34.0)
MCHC: 33.1 g/dL (ref 30.0–36.0)
MCV: 94.2 fL (ref 80.0–100.0)
Platelets: 168 10*3/uL (ref 150–400)
RBC: 3.82 MIL/uL — ABNORMAL LOW (ref 4.22–5.81)
RDW: 12.6 % (ref 11.5–15.5)
WBC: 9.1 10*3/uL (ref 4.0–10.5)
nRBC: 0 % (ref 0.0–0.2)

## 2020-05-07 LAB — GLUCOSE, CAPILLARY
Glucose-Capillary: 157 mg/dL — ABNORMAL HIGH (ref 70–99)
Glucose-Capillary: 152 mg/dL — ABNORMAL HIGH (ref 70–99)
Glucose-Capillary: 139 mg/dL — ABNORMAL HIGH (ref 70–99)
Glucose-Capillary: 160 mg/dL — ABNORMAL HIGH (ref 70–99)

## 2020-05-07 IMAGING — CR DG CERVICAL SPINE 2 OR 3 VIEWS
2 series · 2 of 2 positions shown · non-contrast
Comparison: [DATE] and [DATE] cervical spine radiographs

CLINICAL DATA: Status post cervical spine fusion

EXAM:
CERVICAL SPINE - 2-3 VIEW

[c-spine lat]
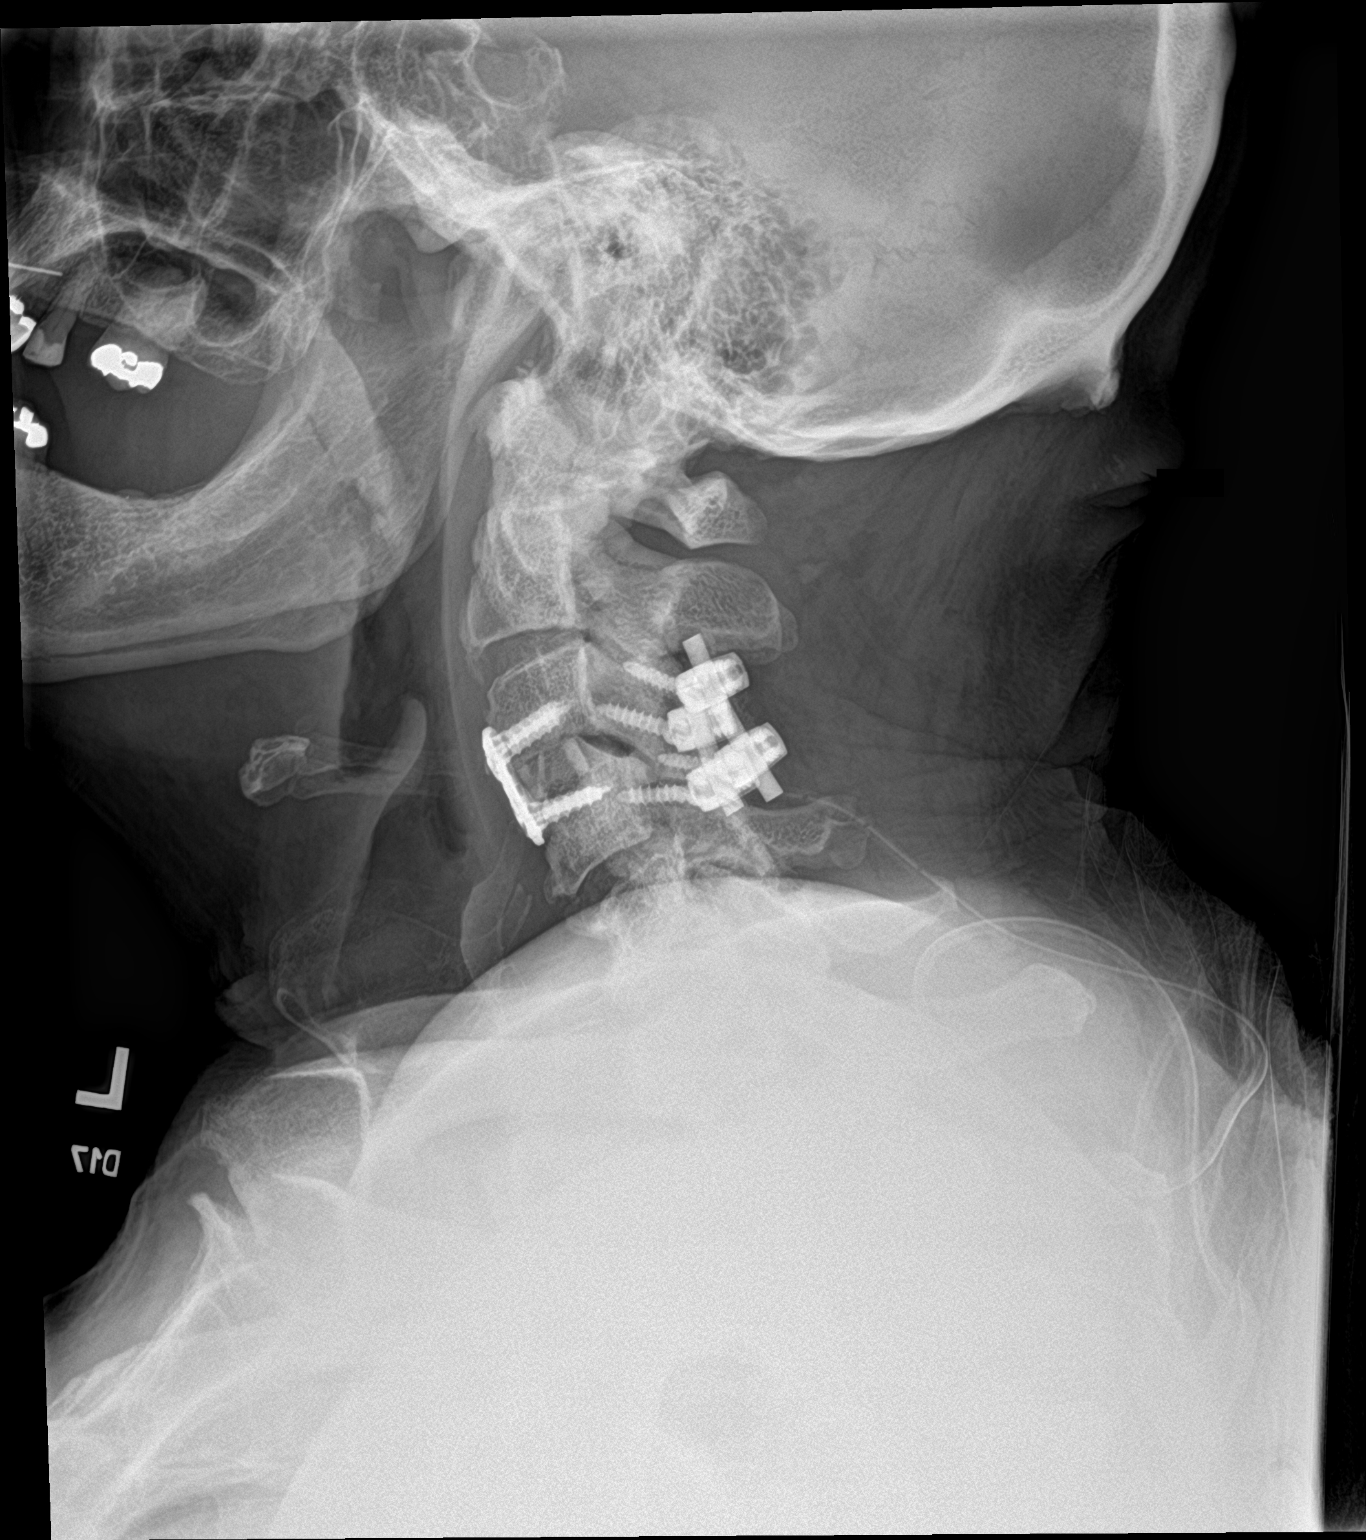

[c-spine ap]
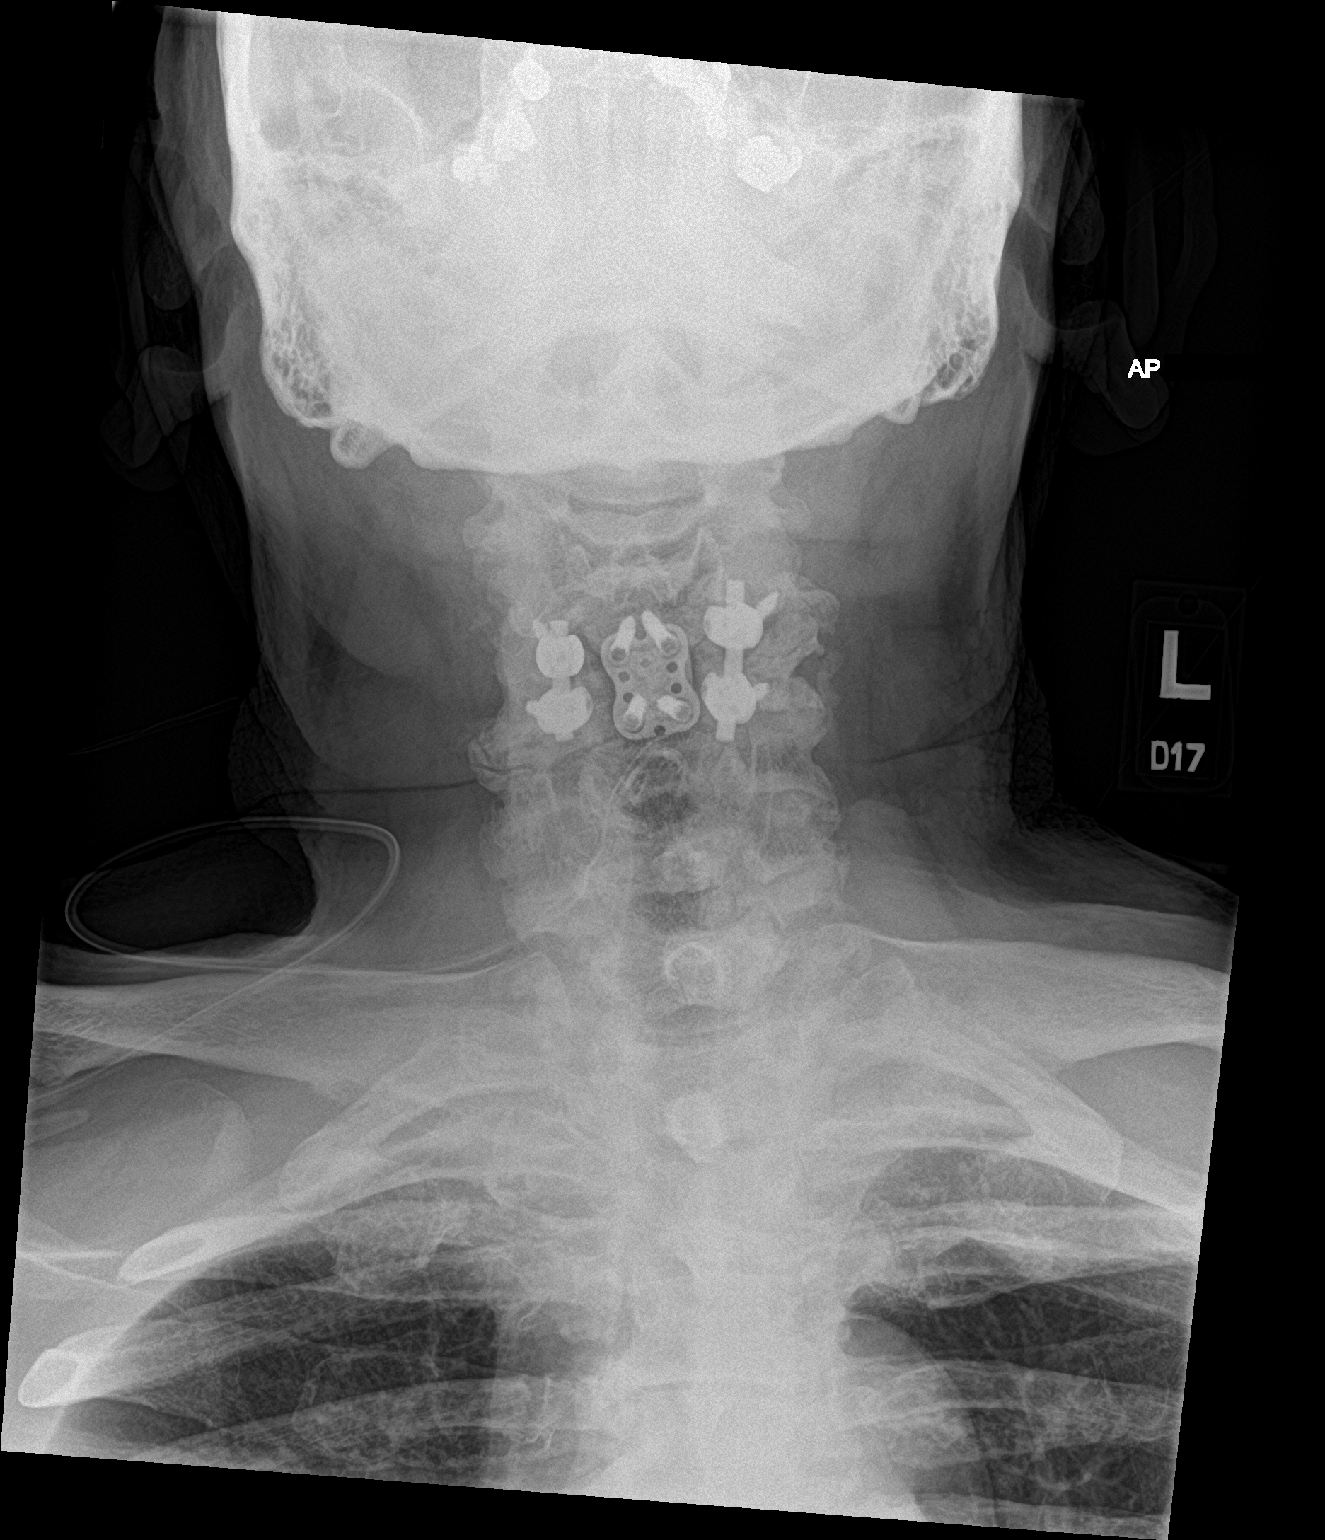

[2 of 2 positions shown; findings below may reference images not displayed]

FINDINGS: On the lateral view the cervical spine is visualized to the level of
C4-5. There is a normal cervical lordosis. Pre-vertebral soft
tissues are within normal limits. No fracture is detected in the
cervical spine. Status post interval bilateral posterior spinal
fusion at C3-4 with bilateral pedicle screws. Previous surgical
hardware from ACDF at C3-4. No evidence of hardware fracture or
loosening. Moderate multilevel cervical degenerative disc disease,
most prominent at C5-6. No cervical spine subluxation. Marked
bilateral facet arthropathy. No aggressive-appearing focal osseous
lesions.
IMPRESSION: 1. Satisfactory appearance of interval bilateral posterior spinal
fusion at C3-4. Previous ACDF at C3-4. No evidence of hardware
complication.
2. Moderate multilevel cervical degenerative disc disease, most
prominent at C5-6. Marked bilateral facet arthropathy.

## 2020-05-07 MED ORDER — CARVEDILOL 12.5 MG PO TABS
ORAL_TABLET | ORAL | Status: AC
  Administered 2020-05-07: 6.25 mg via ORAL
  Filled 2020-05-07: qty 1

## 2020-05-07 MED ORDER — ACETAMINOPHEN 500 MG PO TABS
ORAL_TABLET | ORAL | Status: AC
  Administered 2020-05-07: 1000 mg via ORAL
  Filled 2020-05-07: qty 2

## 2020-05-07 MED ORDER — CIPROFLOXACIN HCL 500 MG PO TABS
ORAL_TABLET | ORAL | Status: AC
  Filled 2020-05-07: qty 1

## 2020-05-07 MED ORDER — INSULIN ASPART 100 UNIT/ML ~~LOC~~ SOLN
SUBCUTANEOUS | Status: AC
  Filled 2020-05-07: qty 1

## 2020-05-07 MED ORDER — CIPROFLOXACIN HCL 500 MG PO TABS
ORAL_TABLET | ORAL | Status: AC
  Administered 2020-05-07: 500 mg via ORAL
  Filled 2020-05-07: qty 1

## 2020-05-07 MED ORDER — ACETAMINOPHEN 500 MG PO TABS
1000.0000 mg | ORAL_TABLET | Freq: Four times a day (QID) | ORAL | Status: DC
  Administered 2020-05-07 – 2020-05-08 (×3): 1000 mg via ORAL

## 2020-05-07 MED ORDER — ACETAMINOPHEN 500 MG PO TABS
ORAL_TABLET | ORAL | Status: AC
  Filled 2020-05-07: qty 2

## 2020-05-07 MED ORDER — FAMOTIDINE 20 MG PO TABS
ORAL_TABLET | ORAL | Status: AC
  Administered 2020-05-07: 20 mg via ORAL
  Filled 2020-05-07: qty 1

## 2020-05-07 MED ORDER — OXYCODONE HCL 5 MG PO TABS
ORAL_TABLET | ORAL | Status: AC
  Administered 2020-05-07: 5 mg via ORAL
  Filled 2020-05-07: qty 1

## 2020-05-07 MED ORDER — GABAPENTIN 300 MG PO CAPS
ORAL_CAPSULE | ORAL | Status: AC
  Administered 2020-05-07: 300 mg via ORAL
  Filled 2020-05-07: qty 1

## 2020-05-07 MED ORDER — CARVEDILOL 12.5 MG PO TABS
ORAL_TABLET | ORAL | Status: AC
  Filled 2020-05-07: qty 1

## 2020-05-07 MED ORDER — MELOXICAM 7.5 MG PO TABS
ORAL_TABLET | ORAL | Status: AC
  Filled 2020-05-07: qty 1

## 2020-05-07 MED ORDER — INSULIN ASPART 100 UNIT/ML ~~LOC~~ SOLN
SUBCUTANEOUS | Status: AC
  Administered 2020-05-07: 4 [IU] via SUBCUTANEOUS
  Filled 2020-05-07: qty 1

## 2020-05-07 NOTE — Evaluation (Signed)
Physical Therapy Evaluation Patient Details Name: Roy [name]. MRN: [id number] DOB: March 30, 1946 Today's Date: 05/07/2020   History of Present Illness  Roy [name] is a 75 y.o. male who is s/p C3-4 laminectomy and fusion. Pt has had 3 month history of repeated hospitalization/STR stays. Pt initially presented to the emergency department on 01/14/20 with concern for worsening weakness in his lower extremities.  MRI of the cervical spine at that time revealed severe stenosis at C3-4 with spinal cord compression.  He underwent initial C 3-4 anterior cervical decompression/discectomy on 01/29/20. PMH significant for chronic diastolic heart failure, HTN, HLD, DM2, GERD, R TKA.    Clinical Impression  Patient alert, oriented, able to answer all questions and follow all commands clearly. Pt and family (via phone) confirmed PLOF, pt has not stood properly since rehab, and has been working with HHPT. Hoyer lift to [facility] or lift chair in home for primary mobility.  The patient was able to demonstrate UE and LE movement; pt exhibited more weakness in LLE compared to RLE. Pt remains motivated to participate as much as possible with therapy and maximize independence. Bed mobility including supine to sit deferred due to lack of cervical collar and unclear cervical precautions; MD and RN notified.  Overall the patient demonstrated deficits (see "PT Problem List") that impede the patient's functional abilities, safety, and mobility and would benefit from skilled PT intervention. Recommendation is HHPT with 24/7 supervision/assistance, family and pt denied any further equipment needs at this time.    Follow Up Recommendations Supervision/Assistance - 24 hour;Home health PT    Equipment Recommendations  None recommended by PT    Recommendations for Other Services       Precautions / Restrictions Precautions Precautions: Fall;Cervical Required Braces or Orthoses: Cervical Brace       Mobility  Bed Mobility               General bed mobility comments: Deferred for pt safety/comfort at this time.    Transfers                 General transfer comment: Deferred for pt safety/comfort at this time.  Ambulation/Gait                Stairs            Wheelchair Mobility    Modified Rankin (Stroke Patients Only)       Balance Overall balance assessment: Needs assistance                                           Pertinent Vitals/Pain Pain Assessment: 0-10 Pain Score: 8  Pain Location: L side of neck with movement Pain Intervention(s): Limited activity within patient's tolerance;Monitored during session;Premedicated before session    Home Living Family/patient expects to be discharged to:: Private residence Living Arrangements: Spouse/significant other Available Help at Discharge: Family;Available 24 hours/day Type of Home: House Home Access: Stairs to enter Entrance Stairs-Rails: Right Entrance Stairs-Number of Steps: 7 in front, 4 in the garage Home Layout: One level Home Equipment: Walker - 2 wheels;Walker - 4 wheels;Grab bars - tub/shower;Shower seat;Wheelchair - Careers adviser (comment);Bedside commode;Cane - single point Additional Comments: Mechanical Lift; Lift recliner.    Prior Function Level of Independence: Needs assistance      ADL's / Homemaking Assistance Needed: Pt/caregiver report, pt requires assist with most ADL management at baseline. He  has difficulty with self-feeding, bathing, and dressing. He has been generally bed level for ADL management. Wife uses mechanical lift for transfers to his recliner. Has been able to attempt limited standing attempts with home health since DC from STR ~2 months ago.  Comments: PLOF gathered from pt and family via phone call     Hand Dominance   Dominant Hand: Left    Extremity/Trunk Assessment   Upper Extremity Assessment Upper Extremity Assessment:  Defer to OT evaluation RUE Deficits / Details: RUE generally weak, pt endorses hx of RUE injury which limits FMC particularly at 4th-5th digit. Grip strength minimal 2/5. RUE Sensation: decreased light touch;decreased proprioception;history of peripheral neuropathy RUE Coordination: decreased fine motor;decreased gross motor LUE Deficits / Details: Pt endorses LUE sensation is about the same since his sx. LUE remains weak with poor shoulder flexion/abduction. AROM limited to ~80-90 degrees strength poor grossly 2/5 as well as decreased FMC and edema in L hand. LUE Sensation: decreased proprioception;decreased light touch;history of peripheral neuropathy    Lower Extremity Assessment Lower Extremity Assessment: Generalized weakness;LLE deficits/detail;RLE deficits/detail RLE Deficits / Details: able to SAQ without assistance RLE Sensation: WNL LLE Deficits / Details: needed assistance for short arc quad or heel slide LLE Sensation: WNL       Communication   Communication: No difficulties  Cognition Arousal/Alertness: Awake/alert Behavior During Therapy: WFL for tasks assessed/performed Overall Cognitive Status: Within Functional Limits for tasks assessed                                        General Comments General comments (skin integrity, edema, etc.): cervical drains in place at start/end of session    Exercises Other Exercises Other Exercises: SAQ, ankle pumps, quad sets, heel slides, and hip abduction/adduction x5 bilaterally (AAROM for LLE as needed)   Assessment/Plan    PT Assessment Patient needs continued PT services  PT Problem List Decreased strength;Decreased balance;Decreased activity tolerance;Decreased knowledge of precautions;Decreased knowledge of use of DME       PT Treatment Interventions DME instruction;Therapeutic exercise;Wheelchair mobility training;Balance training;Neuromuscular re-education;Functional mobility training;Therapeutic  activities;Patient/family education    PT Goals (Current goals can be found in the Care Plan section)  Acute Rehab PT Goals Patient Stated Goal: To be able to use a RW and get up to a BSC instead of using the bedpan. PT Goal Formulation: With patient Time For Goal Achievement: 05/21/20 Potential to Achieve Goals: Good    Frequency Min 2X/week   Barriers to discharge        Co-evaluation   Reason for Co-Treatment: Complexity of the patient's impairments (multi-system involvement);For patient/therapist safety PT goals addressed during session: Mobility/safety with mobility;Strengthening/ROM;Proper use of DME OT goals addressed during session: ADL's and self-care;Strengthening/ROM       AM-PAC PT "6 Clicks" Mobility  Outcome Measure Help needed turning from your back to your side while in a flat bed without using bedrails?: A Lot Help needed moving from lying on your back to sitting on the side of a flat bed without using bedrails?: Total Help needed moving to and from a bed to a chair (including a wheelchair)?: Total Help needed standing up from a chair using your arms (e.g., wheelchair or bedside chair)?: Total Help needed to walk in hospital room?: Total Help needed climbing 3-5 steps with a railing? : Total 6 Click Score: 7    End of Session  Activity Tolerance: Patient tolerated treatment well Patient left: with call bell/phone within reach;with bed alarm set Nurse Communication: Mobility status PT Visit Diagnosis: Other abnormalities of gait and mobility (R26.89);Muscle weakness (generalized) (M62.81)    Time: 5400-8676 PT Time Calculation (min) (ACUTE ONLY): 38 min   Charges:   PT Evaluation $PT Eval Moderate Complexity: 1 Mod PT Treatments $Therapeutic Exercise: 8-22 mins       Olga Coaster PT, DPT 1:45 PM,05/07/20

## 2020-05-07 NOTE — TOC Progression Note (Signed)
Transition of Care [facility]) - CM/SW Discharge Note   Patient Details  Name: Roy [name]. MRN: [id number] Date of Birth: 24-Apr-1945  [facility] of [facility]) CM/SW Contact:  Marina Goodell Phone Number: [phone] 05/07/2020, 3:39 PM   Clinical Narrative:     Patient will d/c home with, home health PT, OT and Aide.  CSW pending confirmation from [place] w/ [facility]. CSW spoke with patient's Terin, Cragle (Spouse) [phone], she stated she would like for the patient to remain active with [facility].         Patient Goals and CMS Choice        Discharge Placement                       Discharge Plan and Services                                     Social Determinants of Health (SDOH) Interventions     Readmission Risk Interventions No flowsheet data found.

## 2020-05-07 NOTE — Evaluation (Signed)
Occupational Therapy Evaluation Patient Details Name: Roy [name]. MRN: [id number] DOB: October 22, 1945 Today's Date: 05/07/2020    History of Present Illness Roy [name] is a 75 y.o. male who is s/p C3-4 laminectomy and fusion. Pt has had 3 month history of repeated hospitalization/STR stays. Pt initially presented to the emergency department on 01/14/20 with concern for worsening weakness in his lower extremities.  MRI of the cervical spine at that time revealed severe stenosis at C3-4 with spinal cord compression.  He underwent initial C 3-4 anterior cervical decompression/discectomy on 01/29/20. PMH significant for chronic diastolic heart failure, HTN, HLD, DM2, GERD, R TKA.   Clinical Impression   Roy [name] was seen for OT/PT co-evaluation this date. Pt familiar to OT from past hospital admissions. He reports that since his most recent admission, he went to STR and has been at home for the past 2 months. Pt endorses intermittent mobility attempts with [facility] therapy services, but states he has not been able to ambulate or stand without assist. Pt states that his wife assists with most BADL tasks and that he uses a bedpan for toileting. Currently pt demonstrates impairments as described below (See OT problem list) which functionally limit his ability to perform ADL/self-care tasks. Pt currently requires MAX-TOTAL assist for bed-level BADL management including toileting, grooming, and self-feeding. Mobility deferred for pt safety, awaiting clearance from MD for activity restrictions/precautions and instructions for use of cervical collar with mobilty this date. Pt educated on basic cervical precautions and considerations for ADL management. Pt would benefit from skilled OT services to address noted impairments and functional limitations (see below for any additional details) in order to maximize safety and independence while minimizing falls risk and caregiver burden. Upon hospital  discharge, recommend HHOT to maximize pt safety and return to functional independence during meaningful occupations of daily life.      Follow Up Recommendations  Home health OT;Supervision/Assistance - 24 hour    Equipment Recommendations  None recommended by OT (Pt has necessary equipment.)    Recommendations for Other Services       Precautions / Restrictions Precautions Precautions: Fall;Cervical Required Braces or Orthoses: Cervical Brace      Mobility Bed Mobility               General bed mobility comments: Deferred for pt safety/comfort at this time.    Transfers                 General transfer comment: Deferred for pt safety/comfort at this time.    Balance Overall balance assessment: Needs assistance                                         ADL either performed or assessed with clinical judgement   ADL Overall ADL's : Needs assistance/impaired Eating/Feeding: Maximal assistance;Bed level   Grooming: Moderate assistance;Maximal assistance;Bed level                                 General ADL Comments: Pt is significantly functionally limited by generalized weakness, decreased activity tolerance, and decreased functional use of BUE. He requires MAX A for bed level UB ADL management with consistent cueing to adhere to cervical precautions during session. Unable to assess functional mobility as pt does not have cervical collar or bilat knee braces (which he  states he requires for any standing attempts).     Vision Baseline Vision/History: Wears glasses Patient Visual Report: No change from baseline       Perception     Praxis      Pertinent Vitals/Pain Pain Assessment: 0-10 Pain Score: 8  Pain Location: L side of neck with movement Pain Intervention(s): Limited activity within patient's tolerance;Monitored during session;Premedicated before session     Hand Dominance Left   Extremity/Trunk Assessment  Upper Extremity Assessment Upper Extremity Assessment: RUE deficits/detail;LUE deficits/detail RUE Deficits / Details: RUE generally weak, pt endorses hx of RUE injury which limits FMC particularly at 4th-5th digit. Grip strength minimal 2/5. RUE Sensation: decreased light touch;decreased proprioception;history of peripheral neuropathy RUE Coordination: decreased fine motor;decreased gross motor LUE Deficits / Details: Pt endorses LUE sensation is about the same since his sx. LUE remains weak with poor shoulder flexion/abduction. AROM limited to ~80-90 degrees strength poor grossly 2/5 as well as decreased FMC and edema in L hand. LUE Sensation: decreased proprioception;decreased light touch;history of peripheral neuropathy   Lower Extremity Assessment Lower Extremity Assessment: Generalized weakness;Defer to PT evaluation       Communication Communication Communication: No difficulties   Cognition Arousal/Alertness: Awake/alert Behavior During Therapy: WFL for tasks assessed/performed Overall Cognitive Status: Within Functional Limits for tasks assessed                                     General Comments  Cervical drains in place at start/end of session.    Exercises Other Exercises Other Exercises: Pt educated on cervical precautions, consideration for ADL management in light of cervical precautions, and safe bed mobility techniques.   Shoulder Instructions      Home Living Family/patient expects to be discharged to:: Private residence Living Arrangements: Spouse/significant other Available Help at Discharge: Family;Available 24 hours/day Type of Home: House Home Access: Stairs to enter [employer] of Steps: 7 in front, 4 in the garage Entrance Stairs-Rails: Right Home Layout: One level     Bathroom Shower/Tub: Producer, television/film/video: Handicapped height Bathroom Accessibility: Yes How Accessible: Accessible via walker Home  Equipment: Walker - 2 wheels;Walker - 4 wheels;Grab bars - tub/shower;Shower seat;Wheelchair - Careers adviser (comment);Bedside commode;Cane - single point   Additional Comments: Mechanical Lift; Lift recliner.      Prior Functioning/Environment Level of Independence: Needs assistance    ADL's / Homemaking Assistance Needed: Pt/caregiver report, pt requires assist with most ADL management at baseline. He has difficulty with self-feeding, bathing, and dressing. He has been generally bed level for ADL management. Wife uses mechanical lift for transfers to his recliner. Has been able to attempt limited standing attempts with home health since DC from STR ~2 months ago.            OT Problem List: Decreased strength;Decreased coordination;Pain;Decreased range of motion;Decreased activity tolerance;Impaired sensation;Impaired balance (sitting and/or standing);Decreased knowledge of use of DME or AE;Impaired UE functional use;Decreased knowledge of precautions;Increased edema      OT Treatment/Interventions: Self-care/ADL training;Therapeutic exercise;Therapeutic activities;DME and/or AE instruction;Balance training;Patient/family education;Energy conservation;Neuromuscular education    OT Goals(Current goals can be found in the care plan section) Acute Rehab OT Goals Patient Stated Goal: To be able to use a RW and get up to a BSC instead of using the bedpan. OT Goal Formulation: With patient Time For Goal Achievement: 05/21/20 Potential to Achieve Goals: Good ADL Goals Pt Will Perform Grooming:  sitting;with mod assist;with adaptive equipment;with caregiver independent in assisting (c LRAD PRN for improved safety and functional indep.) Pt Will Transfer to Toilet: with +2 assist;with mod assist;stand pivot transfer;bedside commode Pt Will Perform Toileting - Clothing Manipulation and hygiene: with adaptive equipment;with mod assist;sit to/from stand  OT Frequency: Min 1X/week   Barriers to D/C:             Co-evaluation PT/OT/SLP Co-Evaluation/Treatment: Yes Reason for Co-Treatment: Complexity of the patient's impairments (multi-system involvement);For patient/therapist safety PT goals addressed during session: Mobility/safety with mobility;Strengthening/ROM;Proper use of DME OT goals addressed during session: ADL's and self-care;Strengthening/ROM;Proper use of Adaptive equipment and DME      AM-PAC OT "6 Clicks" Daily Activity     Outcome Measure Help from another person eating meals?: A Lot Help from another person taking care of personal grooming?: A Lot Help from another person toileting, which includes using toliet, bedpan, or urinal?: A Lot Help from another person bathing (including washing, rinsing, drying)?: A Lot Help from another person to put on and taking off regular upper body clothing?: A Lot Help from another person to put on and taking off regular lower body clothing?: A Lot 6 Click Score: 12   End of Session Nurse Communication: Mobility status;Precautions  Activity Tolerance: Patient tolerated treatment well Patient left: in bed;with call bell/phone within reach;with bed alarm set;Other (comment)  OT Visit Diagnosis: Other abnormalities of gait and mobility (R26.89);Pain Pain - Right/Left: Left (Neck) Pain - part of body: Shoulder                Time: 0925-1005 OT Time Calculation (min): 40 min Charges:  OT General Charges $OT Visit: 1 Visit OT Evaluation $OT Eval Moderate Complexity: 1 Mod OT Treatments $Self Care/Home Management : 8-22 mins  Rockney Ghee, M.S., OTR/L Ascom: [phone] 05/07/20, 1:05 PM

## 2020-05-07 NOTE — Progress Notes (Signed)
Orthopedic Tech Progress Note Patient Details:  Roy [name] [birth year] [id number] Called in STAT order to [facility] for an ASPEN CERVICAL COLLAR LARGE.  Patient ID: Tavian Callander., male   DOB: May 12, 1945, 75 y.o.   MRN: [id number]   Donald Pore 05/07/2020, 9:40 AM

## 2020-05-07 NOTE — Progress Notes (Addendum)
Progress Note   Date: 05/07/2020  Subjective: Roy [name] is doing well POD1 from C3-4 laminectomy and fusion. He is at baseline.  His pain is relatively well controlled.   Vital Signs: Temp:  [97.1 F (36.2 C)-98.6 F (37 C)] 98.6 F (37 C) (01/26 0400) Pulse Rate:  [61-84] 67 (01/26 0400) Resp:  [14-22] 16 (01/26 0400) BP: (117-153)/(60-86) 134/79 (01/26 0400) SpO2:  [95 %-100 %] 100 % (01/26 0400) Arterial Line BP: (83)/(62) 83/62 (01/25 0845) Temp (24hrs), Avg:97.6 F (36.4 C), Min:97.1 F (36.2 C), Max:98.6 F (37 C)  Weight: 110.2 kg   Problem List Patient Active Problem List   Diagnosis Date Noted  . Cervical myelopathy (HCC) 05/05/2020  . Post-operative state 01/31/2020  . Hypertension   . Diabetes mellitus without complication (HCC)   . HLD (hyperlipidemia)   . Chronic diastolic CHF (congestive heart failure) (HCC)   . S/P cervical spinal fusion 01/29/2020  . Weakness   . Spinal stenosis of lumbar region   . History of progressive weakness   . Claudication (HCC)   . Lower extremity weakness 01/14/2020    Medications: Scheduled Meds: . baclofen  5 mg Oral TID  . brimonidine  1 drop Right Eye BID  . carvedilol  6.25 mg Oral BID  . ciprofloxacin  500 mg Oral BID  . enoxaparin (LOVENOX) injection  0.5 mg/kg Subcutaneous Q24H  . famotidine  20 mg Oral QPM  . gabapentin  300 mg Oral QHS  . insulin aspart  1 Units Subcutaneous TID WC  . insulin detemir  12 Units Subcutaneous QHS  . latanoprost  1 drop Right Eye QHS  . lisinopril  10 mg Oral BH-q7a  . meloxicam  7.5 mg Oral Daily  . multivitamin with minerals  1 tablet Oral Daily  . simvastatin  20 mg Oral q1800  . tamsulosin  0.4 mg Oral QPC supper  . cyanocobalamin  2,000 mcg Oral Daily   Continuous Infusions: . dextrose 5 % and 0.45 % NaCl with KCl 20 mEq/L Stopped (05/06/20 1553)   PRN Meds:.acetaminophen **OR** acetaminophen, morphine injection, ondansetron **OR** ondansetron (ZOFRAN) IV,  oxyCODONE, polyethylene glycol  Labs:  Lab Results  Component Value Date   WBC 9.1 05/07/2020   WBC 7.2 05/01/2020   HCT 36.0 (L) 05/07/2020   HCT 44.0 05/01/2020   PLT 168 05/07/2020   PLT 232 05/01/2020    Lab Results  Component Value Date   INR 1.0 05/01/2020   APTT 31 05/01/2020    Lab Results  Component Value Date   NA 141 05/07/2020   NA 142 05/01/2020   K 4.4 05/07/2020   K 4.1 05/01/2020   BUN 10 05/07/2020   BUN 25 (H) 05/01/2020    Lab Results  Component Value Date   MG 1.7 01/22/2020   Exam:  General appearance: Alert, cooperative, in no acute distress Neck: Well-healed anterior incision CV: Regular rate and rhythm Pulm: Clear to auscultation  Neurologic exam:  Mental status: alertness: alert, affect: normal Speech: fluent and clear Motor:strength is 4- out of 5 diffusely and bilateral deltoid, bicep, tricep, 3/5 in grip, he is 4+ out of 5 strength in bilateral hip flexion, dorsiflexion, plantarflexion Sensory: Decreased light touch in bilateral hands and feet Gait: Not tested, he cannot ambulate  Incision c/d/i Drai 100 ml  A/P:  POD1 s/p C3-4 lami fusion for myelopathy  - pain control - switch to orals - PTOT - obtain C spine xrays today - will liaise with  case management regarding home health after discharge. - Continue diabetic diet with close glucose control (good control last 24 hrs)   Venetia Night, MD

## 2020-05-08 ENCOUNTER — Encounter: Payer: Self-pay | Admitting: Neurosurgery

## 2020-05-08 LAB — GLUCOSE, CAPILLARY: Glucose-Capillary: 103 mg/dL — ABNORMAL HIGH (ref 70–99)

## 2020-05-08 MED ORDER — OXYCODONE HCL 5 MG PO TABS
ORAL_TABLET | ORAL | Status: AC
  Filled 2020-05-08: qty 1

## 2020-05-08 MED ORDER — OXYCODONE HCL 5 MG PO TABS: 5.0000 mg | ORAL_TABLET | ORAL | 0 refills | Status: DC | PRN

## 2020-05-08 MED ORDER — ACETAMINOPHEN 500 MG PO TABS: 1000.0000 mg | ORAL_TABLET | Freq: Four times a day (QID) | ORAL | 0 refills | Status: DC

## 2020-05-08 MED ORDER — ACETAMINOPHEN 500 MG PO TABS
ORAL_TABLET | ORAL | Status: AC
  Filled 2020-05-08: qty 2

## 2020-05-08 MED ORDER — CIPROFLOXACIN HCL 500 MG PO TABS
ORAL_TABLET | ORAL | Status: AC
  Administered 2020-05-08: 500 mg via ORAL
  Filled 2020-05-08: qty 1

## 2020-05-08 MED ORDER — CARVEDILOL 12.5 MG PO TABS
ORAL_TABLET | ORAL | Status: AC
  Filled 2020-05-08: qty 1

## 2020-05-08 MED ORDER — MELOXICAM 7.5 MG PO TABS
ORAL_TABLET | ORAL | Status: AC
  Administered 2020-05-08: 7.5 mg via ORAL
  Filled 2020-05-08: qty 1

## 2020-05-08 NOTE — TOC Transition Note (Signed)
Transition of Care [facility]) - CM/SW Discharge Note   Patient Details  Name: Roy [name]. MRN: [id number] Date of Birth: 14-Mar-1946  Transition of Care [facility]) CM/SW Contact:  Allayne Butcher, RN Phone Number: 05/08/2020, 9:19 AM   Clinical Narrative:     Patient to discharge home with home health services today.  [place] with [facility] notified of discharge home and home health orders placed by MD.  Wife will pick patient up at DC.   Final next level of care: Home w Home Health Services Barriers to Discharge: Barriers Resolved   Patient Goals and CMS Choice   CMS Medicare.gov Compare Post Acute Care list provided to:: Patient Choice offered to / list presented to : Patient  Discharge Placement                       Discharge Plan and Services                          HH Arranged: PT,OT,Nurse's Aide [facility] Agency: [facility] Date [facility] Agency Contacted: 05/08/20 Time HH Agency Contacted: 0919 Representative spoke with at [facility] Agency: [place]  Social Determinants of Health (SDOH) Interventions     Readmission Risk Interventions No flowsheet data found.

## 2020-05-08 NOTE — Discharge Instructions (Addendum)
Your surgeon has performed an operation on your cervical spine (neck) to relieve pressure on one or more nerves. Many times, patients feel better immediately after surgery and can overdo it. Even if you feel well, it is important that you follow these activity guidelines. If you do not let your neckheal properly from the surgery, you can increase the chance of a disc herniation and/or return of your symptoms. The following are instructions to help in your recovery once you have been discharged from the hospital.    Activity    No bending, lifting, or twisting (BLT). Avoid lifting objects heavier than 10 pounds (gallon milk jug).  Where possible, avoid household activities that involve lifting, bending, pushing, or pulling such as laundry, vacuuming, grocery shopping, and childcare. Try to arrange for help from friends and family for these activities while your back heals.  Increase physical activity slowly as tolerated.  Taking short walks is encouraged, but avoid strenuous exercise. Do not jog, run, bicycle, lift weights, or participate in any other exercises unless specifically allowed by your doctor. Avoid prolonged sitting, including car rides.  Talk to your doctor before resuming sexual activity.  You should not drive until cleared by your doctor.  Until released by your doctor, you should not return to work or school.  You should rest at home and let your body heal.   You may shower three days after your surgery.  After showering, lightly dab your incision dry. Do not take a tub bath or go swimming for 3 weeks, or until approved by your doctor at your follow-up appointment.  If you smoke, we strongly recommend that you quit.  Smoking has been proven to interfere with normal healing in your back and will dramatically reduce the success rate of your surgery. Please contact [facility] (800-QUIT-NOW) and use the resources at www.[facility].com for assistance in stopping smoking.  Surgical  Incision   If you have a dressing on your incision, you may remove it three days after your surgery (Jan 28). Keep your incision area clean and dry. OK to wash with either warm water and mild soap, or alcohol wipes.  Do not aggressively clean the wound.  If you have staples or stitches on your incision, you should have a follow up scheduled for removal. If you do not have staples or stitches, you will have steri-strips (small pieces of surgical tape) or Dermabond glue. The steri-strips/glue should begin to peel away within about a week (it is fine if the steri-strips fall off before then). If the strips are still in place one week after your surgery, you may gently remove them.  Diet            You may return to your usual diet. Be sure to stay hydrated.  When to Contact us  Although your surgery and recovery will likely be uneventful, you may have some residual numbness, aches, and pains in your back and/or legs. This is normal and should improve in the next few weeks.  However, should you experience any of the following, contact us immediately:  New numbness or weakness  Pain that is progressively getting worse, and is not relieved by your pain medications or rest  Bleeding, redness, swelling, pain, or drainage from surgical incision  Chills or flu-like symptoms  Fever greater than 101.0 F (38.3 C)  Problems with bowel or bladder functions  Difficulty breathing or shortness of breath  Warmth, tenderness, or swelling in your calf  Contact Information  During office  hours (Monday-Friday 9 am to 5 pm), please call your physician at [phone]  After hours and weekends, please call [phone] and speak with the answering service, who will contact the doctor on call.  If that fails, call the [facility] Operator at [phone] and ask for the Neurosurgery Resident On Call   For a life-threatening emergency, call 911

## 2020-05-08 NOTE — Progress Notes (Signed)
Progress Note   Date: 05/08/2020  Subjective: Roy [name] is doing well POD2 from C3-4 laminectomy and fusion. He is at baseline.  His pain is relatively well controlled.   Vital Signs: Temp:  [98.3 F (36.8 C)-98.9 F (37.2 C)] 98.9 F (37.2 C) (01/27 0400) Pulse Rate:  [50-69] 50 (01/27 0400) Resp:  [18] 18 (01/27 0400) BP: (108-136)/(63-90) 108/63 (01/27 0400) SpO2:  [95 %-99 %] 99 % (01/27 0400) Temp (24hrs), Avg:98.6 F (37 C), Min:98.3 F (36.8 C), Max:98.9 F (37.2 C)  Weight: 110.2 kg   Problem List Patient Active Problem List   Diagnosis Date Noted  . Cervical myelopathy (HCC) 05/05/2020  . Post-operative state 01/31/2020  . Hypertension   . Diabetes mellitus without complication (HCC)   . HLD (hyperlipidemia)   . Chronic diastolic CHF (congestive heart failure) (HCC)   . S/P cervical spinal fusion 01/29/2020  . Weakness   . Spinal stenosis of lumbar region   . History of progressive weakness   . Claudication (HCC)   . Lower extremity weakness 01/14/2020    Medications: Scheduled Meds: . acetaminophen      . acetaminophen  1,000 mg Oral Q6H  . baclofen  5 mg Oral TID  . brimonidine  1 drop Right Eye BID  . carvedilol  6.25 mg Oral BID  . ciprofloxacin      . ciprofloxacin  500 mg Oral BID  . enoxaparin (LOVENOX) injection  0.5 mg/kg Subcutaneous Q24H  . famotidine  20 mg Oral QPM  . gabapentin  300 mg Oral QHS  . insulin aspart  1 Units Subcutaneous TID WC  . insulin detemir  12 Units Subcutaneous QHS  . latanoprost  1 drop Right Eye QHS  . lisinopril  10 mg Oral BH-q7a  . meloxicam      . meloxicam  7.5 mg Oral Daily  . multivitamin with minerals  1 tablet Oral Daily  . simvastatin  20 mg Oral q1800  . tamsulosin  0.4 mg Oral QPC supper  . cyanocobalamin  2,000 mcg Oral Daily   Continuous Infusions:  PRN Meds:.ondansetron **OR** ondansetron (ZOFRAN) IV, oxyCODONE, polyethylene glycol  Labs:  Lab Results  Component Value Date   WBC  9.1 05/07/2020   WBC 7.2 05/01/2020   HCT 36.0 (L) 05/07/2020   HCT 44.0 05/01/2020   PLT 168 05/07/2020   PLT 232 05/01/2020    Lab Results  Component Value Date   INR 1.0 05/01/2020   APTT 31 05/01/2020    Lab Results  Component Value Date   NA 141 05/07/2020   NA 142 05/01/2020   K 4.4 05/07/2020   K 4.1 05/01/2020   BUN 10 05/07/2020   BUN 25 (H) 05/01/2020    Lab Results  Component Value Date   MG 1.7 01/22/2020   Exam:  General appearance: Alert, cooperative, in no acute distress Neck: Well-healed anterior incision CV: Regular rate and rhythm Pulm: Clear to auscultation  Neurologic exam:  Mental status: alertness: alert, affect: normal Speech: fluent and clear Motor:strength is 4- out of 5 diffusely and bilateral deltoid, bicep, tricep, 3/5 in grip, he is 4+ out of 5 strength in bilateral hip flexion, dorsiflexion, plantarflexion Sensory: Decreased light touch in bilateral hands and feet Gait: Not tested, he cannot ambulate  Incision c/d/i Drain 60 ml  A/P:  POD2 s/p C3-4 lami fusion for myelopathy  - pain control - fine on orals - PTOT - will liaise with case management regarding home health  after discharge. - Continue diabetic diet with close glucose control (good control last 24 hrs) - soft collar ordered - drain removed   Venetia Night, MD

## 2020-05-08 NOTE — Discharge Summary (Signed)
Physician Discharge Summary  Patient ID: Roy [name]. MRN: [id number] DOB/AGE: [birth year] 75 y.o.  Admit date: 05/05/2020 Discharge date: 05/08/2020  Admission Diagnoses: Cervical myelopathy  Discharge Diagnoses:  Active Problems:   Cervical myelopathy [facility])   Discharged Condition: good  Hospital Course: Roy [name] was admitted to the hospital with cervical myelopathy.  He underwent surgical intervention on Jan 25 after he had failure of adequate monitoring on Jan 24.  He tolerated the procedure well and was discharged on POD2 after removal of drain.  Consults: None  Significant Diagnostic Studies: Xrays - good placement of implants  Treatments: surgery: C3-4 PSFD  Discharge Exam: Blood pressure 108/63, pulse (!) 50, temperature 98.9 F (37.2 C), temperature source Temporal, resp. rate 18, height 6\' 2"  (1.88 m), weight 110.2 kg, SpO2 99 %. Doing well, AAOx3.   MAEW.  Please see final progress note for final examination.   Disposition:    Allergies as of 05/08/2020   No Known Allergies     Medication List    TAKE these medications   acetaminophen 500 MG tablet Commonly known as: TYLENOL Take 2 tablets (1,000 mg total) by mouth every 6 (six) hours.   aspirin 81 MG chewable tablet Chew 81 mg by mouth daily.   baclofen 10 MG tablet Commonly known as: LIORESAL Take 5 mg by mouth 3 (three) times daily.   brimonidine 0.2 % ophthalmic solution Commonly known as: ALPHAGAN Place 1 drop into the right eye in the morning and at bedtime.   carvedilol 6.25 MG tablet Commonly known as: COREG Take 6.25 mg by mouth 2 (two) times daily.   CENTRUM SILVER 50+MEN PO Take 1 tablet by mouth daily.   ciprofloxacin 500 MG tablet Commonly known as: CIPRO Take 500 mg by mouth 2 (two) times daily.   cyanocobalamin 2000 MCG tablet Take 2,000 mcg by mouth daily.   famotidine 20 MG tablet Commonly known as: PEPCID Take 20 mg by mouth every evening.   gabapentin 300 MG  capsule Commonly known as: NEURONTIN Take 300 mg by mouth at bedtime.   insulin aspart 100 UNIT/ML injection Commonly known as: novoLOG Inject 4-8 Units into the skin 3 (three) times daily with meals. Sliding scale   latanoprost 0.005 % ophthalmic solution Commonly known as: XALATAN Place 1 drop into the right eye at bedtime.   Levemir FlexTouch 100 UNIT/ML FlexPen Generic drug: insulin detemir Inject 12 Units into the skin at bedtime.   lisinopril 10 MG tablet Commonly known as: ZESTRIL Take 10 mg by mouth every morning.   meloxicam 7.5 MG tablet Commonly known as: MOBIC Take 7.5 mg by mouth daily.   oxyCODONE 5 MG immediate release tablet Commonly known as: Oxy IR/ROXICODONE Take 1 tablet (5 mg total) by mouth every 4 (four) hours as needed for moderate pain.   polyethylene glycol powder 17 GM/SCOOP powder Commonly known as: GLYCOLAX/MIRALAX Take 17 g by mouth daily as needed for moderate constipation.   senna 8.6 MG Tabs tablet Commonly known as: SENOKOT Take 2 tablets (17.2 mg total) by mouth daily as needed for mild constipation.   simvastatin 20 MG tablet Commonly known as: ZOCOR Take 20 mg by mouth daily at 6 PM.   tamsulosin 0.4 MG Caps capsule Commonly known as: FLOMAX Take 1 capsule (0.4 mg total) by mouth daily after supper.        Signed1/29/2022 05/08/2020, 8:22 AM

## 2020-06-05 ENCOUNTER — Ambulatory Visit: Payer: Self-pay | Admitting: Urology

## 2020-07-22 ENCOUNTER — Telehealth: Payer: Medicare HMO | Admitting: Urology

## 2020-07-23 ENCOUNTER — Ambulatory Visit: Payer: Self-pay | Admitting: Urology

## 2020-07-24 ENCOUNTER — Encounter: Payer: Self-pay | Admitting: Neurosurgery

## 2020-08-24 ENCOUNTER — Inpatient Hospital Stay
Admission: EM | Admit: 2020-08-24 | Discharge: 2020-08-27 | DRG: 177 | Disposition: A | Discharge status: Home Health | Payer: Medicare HMO | Attending: Internal Medicine | Admitting: Internal Medicine

## 2020-08-24 ENCOUNTER — Emergency Department: Payer: Medicare HMO

## 2020-08-24 ENCOUNTER — Other Ambulatory Visit: Payer: Self-pay

## 2020-08-24 DIAGNOSIS — M48061 Spinal stenosis, lumbar region without neurogenic claudication: Secondary | ICD-10-CM | POA: Diagnosis present

## 2020-08-24 DIAGNOSIS — I5032 Chronic diastolic (congestive) heart failure: Secondary | ICD-10-CM | POA: Diagnosis present

## 2020-08-24 DIAGNOSIS — G4733 Obstructive sleep apnea (adult) (pediatric): Secondary | ICD-10-CM | POA: Diagnosis present

## 2020-08-24 DIAGNOSIS — E785 Hyperlipidemia, unspecified: Secondary | ICD-10-CM | POA: Diagnosis present

## 2020-08-24 DIAGNOSIS — N390 Urinary tract infection, site not specified: Secondary | ICD-10-CM | POA: Diagnosis not present

## 2020-08-24 DIAGNOSIS — Z791 Long term (current) use of non-steroidal anti-inflammatories (NSAID): Secondary | ICD-10-CM

## 2020-08-24 DIAGNOSIS — E1169 Type 2 diabetes mellitus with other specified complication: Secondary | ICD-10-CM | POA: Diagnosis present

## 2020-08-24 DIAGNOSIS — G959 Disease of spinal cord, unspecified: Secondary | ICD-10-CM | POA: Diagnosis present

## 2020-08-24 DIAGNOSIS — Z981 Arthrodesis status: Secondary | ICD-10-CM | POA: Diagnosis not present

## 2020-08-24 DIAGNOSIS — U071 COVID-19: Secondary | ICD-10-CM | POA: Diagnosis not present

## 2020-08-24 DIAGNOSIS — J9601 Acute respiratory failure with hypoxia: Secondary | ICD-10-CM

## 2020-08-24 DIAGNOSIS — Z794 Long term (current) use of insulin: Secondary | ICD-10-CM

## 2020-08-24 DIAGNOSIS — I110 Hypertensive heart disease with heart failure: Secondary | ICD-10-CM | POA: Diagnosis present

## 2020-08-24 DIAGNOSIS — Z7982 Long term (current) use of aspirin: Secondary | ICD-10-CM | POA: Diagnosis not present

## 2020-08-24 DIAGNOSIS — Z7401 Bed confinement status: Secondary | ICD-10-CM | POA: Diagnosis not present

## 2020-08-24 DIAGNOSIS — I10 Essential (primary) hypertension: Secondary | ICD-10-CM | POA: Diagnosis present

## 2020-08-24 DIAGNOSIS — I428 Other cardiomyopathies: Secondary | ICD-10-CM | POA: Diagnosis present

## 2020-08-24 DIAGNOSIS — J1282 Pneumonia due to coronavirus disease 2019: Secondary | ICD-10-CM | POA: Diagnosis present

## 2020-08-24 DIAGNOSIS — Z978 Presence of other specified devices: Secondary | ICD-10-CM

## 2020-08-24 DIAGNOSIS — Z79899 Other long term (current) drug therapy: Secondary | ICD-10-CM | POA: Diagnosis not present

## 2020-08-24 DIAGNOSIS — T83511A Infection and inflammatory reaction due to indwelling urethral catheter, initial encounter: Secondary | ICD-10-CM | POA: Diagnosis present

## 2020-08-24 DIAGNOSIS — M4800 Spinal stenosis, site unspecified: Secondary | ICD-10-CM | POA: Diagnosis present

## 2020-08-24 DIAGNOSIS — Z993 Dependence on wheelchair: Secondary | ICD-10-CM | POA: Diagnosis not present

## 2020-08-24 DIAGNOSIS — I2510 Atherosclerotic heart disease of native coronary artery without angina pectoris: Secondary | ICD-10-CM | POA: Diagnosis present

## 2020-08-24 DIAGNOSIS — R0902 Hypoxemia: Secondary | ICD-10-CM

## 2020-08-24 DIAGNOSIS — Y846 Urinary catheterization as the cause of abnormal reaction of the patient, or of later complication, without mention of misadventure at the time of the procedure: Secondary | ICD-10-CM | POA: Diagnosis present

## 2020-08-24 LAB — CBC WITH DIFFERENTIAL/PLATELET
Abs Immature Granulocytes: 0.03 10*3/uL (ref 0.00–0.07)
Basophils Absolute: 0 10*3/uL (ref 0.0–0.1)
Basophils Relative: 0 %
Eosinophils Absolute: 0 10*3/uL (ref 0.0–0.5)
Eosinophils Relative: 0 %
HCT: 34.2 % — ABNORMAL LOW (ref 39.0–52.0)
Hemoglobin: 11.3 g/dL — ABNORMAL LOW (ref 13.0–17.0)
Immature Granulocytes: 0 %
Lymphocytes Relative: 12 %
Lymphs Abs: 1.2 10*3/uL (ref 0.7–4.0)
MCH: 30.8 pg (ref 26.0–34.0)
MCHC: 33 g/dL (ref 30.0–36.0)
MCV: 93.2 fL (ref 80.0–100.0)
Monocytes Absolute: 1.1 10*3/uL — ABNORMAL HIGH (ref 0.1–1.0)
Monocytes Relative: 11 %
Neutro Abs: 8.2 10*3/uL — ABNORMAL HIGH (ref 1.7–7.7)
Neutrophils Relative %: 77 %
Platelets: 145 10*3/uL — ABNORMAL LOW (ref 150–400)
RBC: 3.67 MIL/uL — ABNORMAL LOW (ref 4.22–5.81)
RDW: 14.9 % (ref 11.5–15.5)
WBC: 10.6 10*3/uL — ABNORMAL HIGH (ref 4.0–10.5)
nRBC: 0 % (ref 0.0–0.2)

## 2020-08-24 LAB — COMPREHENSIVE METABOLIC PANEL
ALT: 21 U/L (ref 0–44)
AST: 18 U/L (ref 15–41)
Albumin: 3.3 g/dL — ABNORMAL LOW (ref 3.5–5.0)
Alkaline Phosphatase: 79 U/L (ref 38–126)
Anion gap: 8 (ref 5–15)
BUN: 38 mg/dL — ABNORMAL HIGH (ref 8–23)
CO2: 26 mmol/L (ref 22–32)
Calcium: 8.9 mg/dL (ref 8.9–10.3)
Chloride: 100 mmol/L (ref 98–111)
Creatinine, Ser: 0.77 mg/dL (ref 0.61–1.24)
GFR, Estimated: >60 mL/min (ref >60)
Glucose, Bld: 147 mg/dL — ABNORMAL HIGH (ref 70–99)
Potassium: 4.3 mmol/L (ref 3.5–5.1)
Sodium: 134 mmol/L — ABNORMAL LOW (ref 135–145)
Total Bilirubin: 0.7 mg/dL (ref 0.3–1.2)
Total Protein: 6.2 g/dL — ABNORMAL LOW (ref 6.5–8.1)

## 2020-08-24 LAB — URINALYSIS, COMPLETE (UACMP) WITH MICROSCOPIC
Bilirubin Urine: NEGATIVE
Glucose, UA: NEGATIVE mg/dL
Hgb urine dipstick: NEGATIVE
Ketones, ur: NEGATIVE mg/dL
Nitrite: POSITIVE — AB
Protein, ur: NEGATIVE mg/dL
Specific Gravity, Urine: 1.014 (ref 1.005–1.030)
WBC, UA: >50 WBC/hpf — ABNORMAL HIGH (ref 0–5)
pH: 5 (ref 5.0–8.0)

## 2020-08-24 LAB — RESP PANEL BY RT-PCR (FLU A&B, COVID) ARPGX2
Influenza A by PCR: NEGATIVE
Influenza B by PCR: NEGATIVE
SARS Coronavirus 2 by RT PCR: POSITIVE — AB

## 2020-08-24 IMAGING — DX DG CHEST 1V PORT
1 series · 1 of 1 positions shown · non-contrast
Comparison: [DATE]

CLINICAL DATA: Cough x1 day with positive home COVID test

EXAM:
PORTABLE CHEST 1 VIEW

[chest ap]
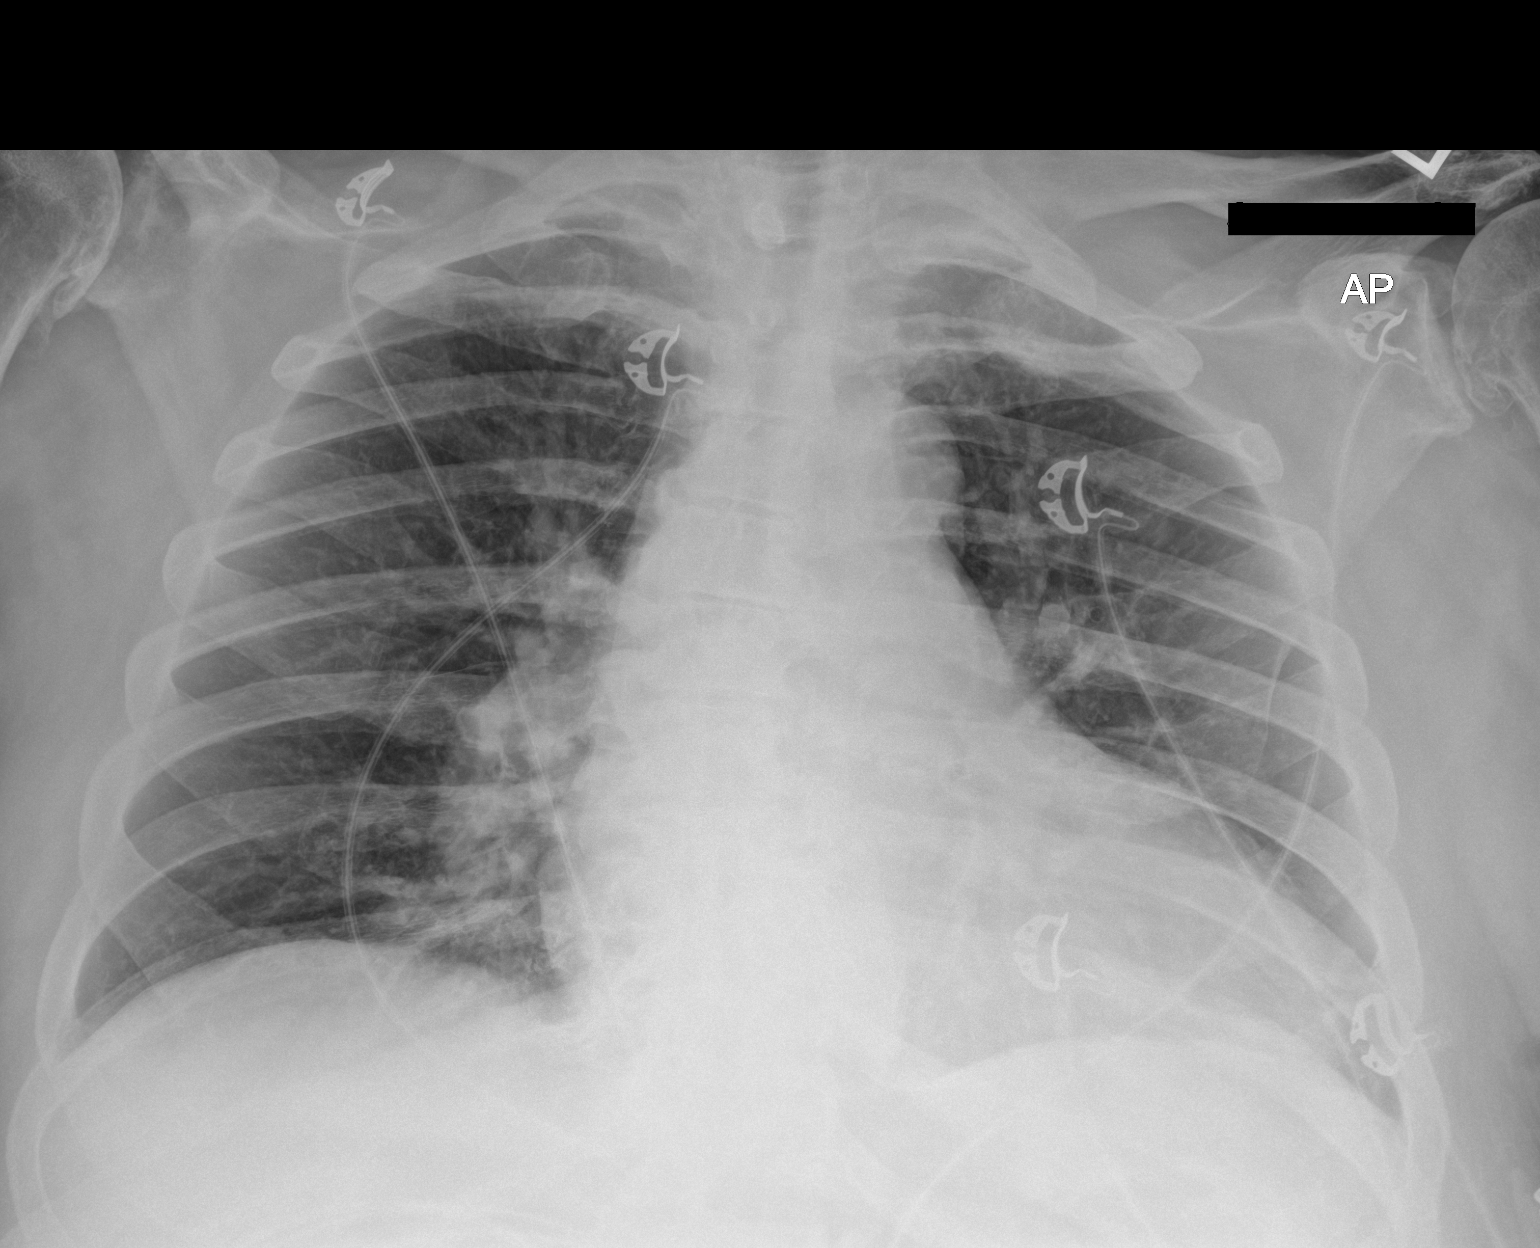

[1 of 1 positions shown; findings below may reference images not displayed]

FINDINGS: Mild cardiac enlargement likely accentuated by technique. Perihilar
interstitial opacities and bronchial wall thickening. No visible
pleural effusion or pneumothorax. No acute osseous abnormality.
IMPRESSION: Perihilar interstitial opacities and bronchial wall thickening,
which may reflect viral pneumonia.

## 2020-08-24 MED ORDER — HYDROCOD POLST-CPM POLST ER 10-8 MG/5ML PO SUER: 5.0000 mL | Freq: Two times a day (BID) | ORAL | Status: DC | PRN

## 2020-08-24 MED ORDER — ONDANSETRON HCL 4 MG/2ML IJ SOLN: 4.0000 mg | Freq: Four times a day (QID) | INTRAMUSCULAR | Status: DC | PRN

## 2020-08-24 MED ORDER — SODIUM CHLORIDE 0.9 % IV SOLN
200.0000 mg | Freq: Once | INTRAVENOUS | Status: AC
  Administered 2020-08-24: 200 mg via INTRAVENOUS
  Filled 2020-08-24: qty 200

## 2020-08-24 MED ORDER — HYDROCODONE-ACETAMINOPHEN 5-325 MG PO TABS: 1.0000 | ORAL_TABLET | ORAL | Status: DC | PRN

## 2020-08-24 MED ORDER — ENOXAPARIN SODIUM 40 MG/0.4ML IJ SOSY
40.0000 mg | PREFILLED_SYRINGE | INTRAMUSCULAR | Status: DC
  Administered 2020-08-25 – 2020-08-27 (×3): 40 mg via SUBCUTANEOUS
  Filled 2020-08-24 (×3): qty 0.4

## 2020-08-24 MED ORDER — INSULIN ASPART 100 UNIT/ML IJ SOLN: 0.0000 [IU] | Freq: Every day | INTRAMUSCULAR | Status: DC

## 2020-08-24 MED ORDER — GUAIFENESIN-DM 100-10 MG/5ML PO SYRP: 10.0000 mL | ORAL_SOLUTION | ORAL | Status: DC | PRN

## 2020-08-24 MED ORDER — ALBUTEROL SULFATE HFA 108 (90 BASE) MCG/ACT IN AERS
2.0000 | INHALATION_SPRAY | Freq: Four times a day (QID) | RESPIRATORY_TRACT | Status: DC
  Administered 2020-08-25 – 2020-08-27 (×11): 2 via RESPIRATORY_TRACT
  Filled 2020-08-24: qty 6.7

## 2020-08-24 MED ORDER — SODIUM CHLORIDE 0.9 % IV SOLN: 100.0000 mg | Freq: Every day | INTRAVENOUS | Status: DC

## 2020-08-24 MED ORDER — SODIUM CHLORIDE 0.9 % IV SOLN
100.0000 mg | Freq: Every day | INTRAVENOUS | Status: DC
  Administered 2020-08-25 – 2020-08-27 (×3): 100 mg via INTRAVENOUS
  Filled 2020-08-24: qty 100
  Filled 2020-08-24: qty 20
  Filled 2020-08-24: qty 100
  Filled 2020-08-24: qty 20

## 2020-08-24 MED ORDER — DEXAMETHASONE SODIUM PHOSPHATE 10 MG/ML IJ SOLN
6.0000 mg | INTRAMUSCULAR | Status: DC
  Administered 2020-08-26 (×2): 6 mg via INTRAVENOUS
  Filled 2020-08-24 (×2): qty 1

## 2020-08-24 MED ORDER — ZINC SULFATE 220 (50 ZN) MG PO CAPS
220.0000 mg | ORAL_CAPSULE | Freq: Every day | ORAL | Status: DC
  Administered 2020-08-25 – 2020-08-27 (×3): 220 mg via ORAL
  Filled 2020-08-24 (×3): qty 1

## 2020-08-24 MED ORDER — SODIUM CHLORIDE 0.9 % IV SOLN
1.0000 g | INTRAVENOUS | Status: DC
  Administered 2020-08-25 – 2020-08-26 (×3): 1 g via INTRAVENOUS
  Filled 2020-08-24: qty 1
  Filled 2020-08-24 (×3): qty 10

## 2020-08-24 MED ORDER — ASCORBIC ACID 500 MG PO TABS
500.0000 mg | ORAL_TABLET | Freq: Every day | ORAL | Status: DC
  Administered 2020-08-25 – 2020-08-27 (×3): 500 mg via ORAL
  Filled 2020-08-24 (×3): qty 1

## 2020-08-24 MED ORDER — DEXAMETHASONE SODIUM PHOSPHATE 10 MG/ML IJ SOLN
10.0000 mg | Freq: Once | INTRAMUSCULAR | Status: AC
  Administered 2020-08-24: 10 mg via INTRAVENOUS
  Filled 2020-08-24: qty 1

## 2020-08-24 MED ORDER — ONDANSETRON HCL 4 MG PO TABS: 4.0000 mg | ORAL_TABLET | Freq: Four times a day (QID) | ORAL | Status: DC | PRN

## 2020-08-24 MED ORDER — SODIUM CHLORIDE 0.9 % IV SOLN: 200.0000 mg | Freq: Once | INTRAVENOUS | Status: DC

## 2020-08-24 MED ORDER — INSULIN ASPART 100 UNIT/ML IJ SOLN
0.0000 [IU] | Freq: Three times a day (TID) | INTRAMUSCULAR | Status: DC
  Administered 2020-08-25 (×3): 3 [IU] via SUBCUTANEOUS
  Administered 2020-08-26: 5 [IU] via SUBCUTANEOUS
  Administered 2020-08-26 – 2020-08-27 (×3): 3 [IU] via SUBCUTANEOUS
  Administered 2020-08-27: 13:00:00 5 [IU] via SUBCUTANEOUS
  Filled 2020-08-24 (×8): qty 1

## 2020-08-24 MED ORDER — ACETAMINOPHEN 325 MG PO TABS: 650.0000 mg | ORAL_TABLET | Freq: Four times a day (QID) | ORAL | Status: DC | PRN

## 2020-08-24 NOTE — ED Triage Notes (Signed)
Pt from home with complaints of cough x1 day and fevers since this morning, high 102.4. Reports positive home covid test this morning Pt has had indwelling catheter, placed about 5 months ago. Patient denies pain

## 2020-08-24 NOTE — H&P (Signed)
History and Physical    Roy [name]. BSJ:[id number] DOB: [birth year] DOA: 08/24/2020  PCP: Rigoberto Noel, MD   Patient coming from: Home  I have personally briefly reviewed patient's old medical records in [facility]  Chief Complaint: Fever, positive home COVID test  HPI: Roy [name]. is a 75 y.o. male with medical history significant for HTN, DM, nonobstructive CAD, nonischemic cardiomyopathy with normalized EF 60% 01/2020, bed-bound secondary to lumbar stenosis and cervical myelopathy status post cervical spinal fusion February 2022 and who also has indwelling Foley catheter, who was sent to the emergency room for evaluation of fever of 102.4, cough x1 day, with home positive COVID test.  He denies shortness of breath or chest pain.  Wife mentioned to EDP that patient's urine has had a foul odor.  Foley was last changed by home health 2 days prior.  Patient denies nausea, vomiting, abdominal pain or change in bowel habits and states overall he feels fine.  Says he has obstructive sleep apnea but does not tolerate the mask ED course: On arrival, temperature 98.4, BP 101/62, pulse 68 respirations 18 with O2 sat initially 84% on room air improving to 94% on 2 L WBC 10,600 with hemoglobin 11.3.  CMP unremarkable.  Urinalysis with positive nitrites and large leukocyte esterase. COVID PCR positive. Chest x-ray, Perihilar interstitial opacities and bronchial wall thickening, which may reflect viral pneumonia  Patient started on remdesivir and Decadron.  Hospitalist consulted for admission.  Review of Systems: As per HPI otherwise all other systems on review of systems negative.    Past Medical History:  Diagnosis Date  . Bedbound   . Bilateral shoulder pain   . CHF (congestive heart failure) (HCC)   . Coronary artery disease   . Diabetes mellitus without complication (HCC)   . GERD (gastroesophageal reflux disease)   . Glaucoma   . HLD (hyperlipidemia)   .  Hypertension   . Lumbar stenosis   . Non-ischemic cardiomyopathy (HCC)   . PVC (premature ventricular contraction)   . Sleep apnea    H/O NO CPAP IN 18 YRS    Past Surgical History:  Procedure Laterality Date  . ANTERIOR CERVICAL DECOMP/DISCECTOMY FUSION N/A 01/29/2020   Procedure: ANTERIOR CERVICAL DECOMPRESSION/DISCECTOMY FUSION 1 LEVEL C3/4;  Surgeon: [name], MD;  Location: [facility] ORS;  Service: Neurosurgery;  Laterality: N/A;  . arm surgery Right    4x surgery as a child, cut arm falling through glass window   . HYDROCELE EXCISION / REPAIR    . POSTERIOR CERVICAL FUSION/FORAMINOTOMY N/A 05/06/2020   Procedure: C3-4 LAMINECTOMY & C3-4 FUSION;  Surgeon: [name], MD;  Location: [facility] ORS;  Service: Neurosurgery;  Laterality: N/A;  . REPLACEMENT TOTAL KNEE Right      reports that he has never smoked. He has never used smokeless tobacco. He reports that he does not drink alcohol and does not use drugs.  No Known Allergies  History reviewed. No pertinent family history.    Prior to Admission medications   Medication Sig Start Date End Date Taking? Authorizing Provider  acetaminophen (TYLENOL) 500 MG tablet Take 2 tablets (1,000 mg total) by mouth every 6 (six) hours. 05/08/20   Venetia Night, MD  aspirin 81 MG chewable tablet Chew 81 mg by mouth daily.    [provider]  baclofen (LIORESAL) 10 MG tablet Take 5 mg by mouth 3 (three) times daily.    [provider]  carvedilol (COREG) 6.25 MG tablet  Take 6.25 mg by mouth 2 (two) times daily. 12/31/19   [provider]  ciprofloxacin (CIPRO) 500 MG tablet Take 500 mg by mouth 2 (two) times daily. 05/01/20   [provider]  cyanocobalamin 2000 MCG tablet Take 2,000 mcg by mouth daily. 01/24/19   [provider]  famotidine (PEPCID) 20 MG tablet Take 20 mg by mouth every evening.    [provider]  gabapentin (NEURONTIN) 300 MG capsule Take 300 mg by mouth at bedtime.     [provider]  insulin aspart (NOVOLOG) 100 UNIT/ML injection Inject 4-8 Units into the skin 3 (three) times daily with meals. Sliding scale    [provider]  insulin detemir (LEVEMIR FLEXTOUCH) 100 UNIT/ML FlexPen Inject 12 Units into the skin at bedtime.  09/15/18   [provider]  latanoprost (XALATAN) 0.005 % ophthalmic solution Place 1 drop into the right eye at bedtime. 08/20/19   [provider]  lisinopril (ZESTRIL) 10 MG tablet Take 10 mg by mouth every morning.    [provider]  meloxicam (MOBIC) 7.5 MG tablet Take 7.5 mg by mouth daily.    [provider]  Multiple Vitamins-Minerals (CENTRUM SILVER 50+MEN PO) Take 1 tablet by mouth daily.    [provider]  oxyCODONE (OXY IR/ROXICODONE) 5 MG immediate release tablet Take 1 tablet (5 mg total) by mouth every 4 (four) hours as needed for moderate pain. 05/08/20   Venetia Night, MD  polyethylene glycol powder (GLYCOLAX/MIRALAX) 17 GM/SCOOP powder Take 17 g by mouth daily as needed for moderate constipation.    [provider]  senna (SENOKOT) 8.6 MG TABS tablet Take 2 tablets (17.2 mg total) by mouth daily as needed for mild constipation. Patient not taking: Reported on 05/01/2020 02/13/20   Patsey Berthold, NP  simvastatin (ZOCOR) 20 MG tablet Take 20 mg by mouth daily at 6 PM.    [provider]  tamsulosin (FLOMAX) 0.4 MG CAPS capsule Take 1 capsule (0.4 mg total) by mouth daily after supper. 01/25/20   Kathrynn Running, MD    Physical Exam: Vitals:   08/24/20 2047 08/24/20 2139 08/24/20 2230  BP: 101/62  (!) 108/57  Pulse: 68 71 65  Resp: 18 19 13   Temp: 98.4 F (36.9 C)    TempSrc: Oral    SpO2: 94% (!) 84% 97%  Weight: 109.3 kg    Height: 6\' 3"  (1.905 m)       Vitals:   08/24/20 2047 08/24/20 2139 08/24/20 2230  BP: 101/62  (!) 108/57  Pulse: 68 71 65  Resp: 18 19 13   Temp: 98.4 F (36.9 C)    TempSrc: Oral    SpO2: 94% (!)  84% 97%  Weight: 109.3 kg    Height: 6\' 3"  (1.905 m)        Constitutional: Alert and oriented x 3 .  Mild conversational dyspnea HEENT:      Head: Normocephalic and atraumatic.         Eyes: PERLA, EOMI, Conjunctivae are normal. Sclera is non-icteric.       Mouth/Throat: Mucous membranes are moist.       Neck: Supple with no signs of meningismus. Cardiovascular: Regular rate and rhythm. No murmurs, gallops, or rubs. 2+ symmetrical distal pulses are present . No JVD. No LE edema Respiratory: Respiratory effort slightly increased.Lungs sounds clear bilaterally. No wheezes, crackles, or rhonchi.  Gastrointestinal: Soft, non tender, and non distended with positive bowel sounds.  Genitourinary: No CVA  tenderness.  Foley catheter, back with dark urine Musculoskeletal: Nontender with normal range of motion in all extremities. No cyanosis, or erythema of extremities.  Muscle atrophy seen on dorsum of hands on Neurologic:  Face is symmetric. Moving all extremities. No gross focal neurologic deficits . Skin: Skin is warm, dry.  No rash or ulcers Psychiatric: Mood and affect are normal    Labs on Admission: I have personally reviewed following labs and imaging studies  CBC: Recent Labs  Lab 08/24/20 2051  WBC 10.6*  NEUTROABS 8.2*  HGB 11.3*  HCT 34.2*  MCV 93.2  PLT 145*   Basic Metabolic Panel: Recent Labs  Lab 08/24/20 2051  NA 134*  K 4.3  CL 100  CO2 26  GLUCOSE 147*  BUN 38*  CREATININE 0.77  CALCIUM 8.9   GFR: Estimated Creatinine Clearance: 108.2 mL/min (by C-G formula based on SCr of 0.77 mg/dL). Liver Function Tests: Recent Labs  Lab 08/24/20 2051  AST 18  ALT 21  ALKPHOS 79  BILITOT 0.7  PROT 6.2*  ALBUMIN 3.3*   No results for input(s): LIPASE, AMYLASE in the last 168 hours. No results for input(s): AMMONIA in the last 168 hours. Coagulation Profile: No results for input(s): INR, PROTIME in the last 168 hours. Cardiac Enzymes: No results for  input(s): CKTOTAL, CKMB, CKMBINDEX, TROPONINI in the last 168 hours. BNP (last 3 results) No results for input(s): PROBNP in the last 8760 hours. HbA1C: No results for input(s): HGBA1C in the last 72 hours. CBG: No results for input(s): GLUCAP in the last 168 hours. Lipid Profile: No results for input(s): CHOL, HDL, LDLCALC, TRIG, CHOLHDL, LDLDIRECT in the last 72 hours. Thyroid Function Tests: No results for input(s): TSH, T4TOTAL, FREET4, T3FREE, THYROIDAB in the last 72 hours. Anemia Panel: No results for input(s): VITAMINB12, FOLATE, FERRITIN, TIBC, IRON, RETICCTPCT in the last 72 hours. Urine analysis:    Component Value Date/Time   COLORURINE YELLOW (A) 08/24/2020 2107   APPEARANCEUR CLOUDY (A) 08/24/2020 2107   LABSPEC 1.014 08/24/2020 2107   PHURINE 5.0 08/24/2020 2107   GLUCOSEU NEGATIVE 08/24/2020 2107   HGBUR NEGATIVE 08/24/2020 2107   BILIRUBINUR NEGATIVE 08/24/2020 2107   KETONESUR NEGATIVE 08/24/2020 2107   PROTEINUR NEGATIVE 08/24/2020 2107   NITRITE POSITIVE (A) 08/24/2020 2107   LEUKOCYTESUR LARGE (A) 08/24/2020 2107    Radiological Exams on Admission: DG Chest Portable 1 View  Result Date: 08/24/2020 CLINICAL DATA:  Cough x1 day with positive home COVID test EXAM: PORTABLE CHEST 1 VIEW COMPARISON:  January 14, 2020 FINDINGS: Mild cardiac enlargement likely accentuated by technique. Perihilar interstitial opacities and bronchial wall thickening. No visible pleural effusion or pneumothorax. No acute osseous abnormality. IMPRESSION: Perihilar interstitial opacities and bronchial wall thickening, which may reflect viral pneumonia. Electronically Signed   By: Maudry Mayhew MD   On: 08/24/2020 23:44     Assessment/Plan 75 year old male with history of HTN, DM, nonobstructive CAD, nonischemic cardiomyopathy with normalized EF 60% 01/2020, bed-bound secondary to lumbar stenosis and cervical myelopathy status post cervical spinal fusion February 2022 with chronic  indwelling Foley catheter, presenting with fever of 102.4, cough x1 day, with home positive COVID test.  Also foul-smelling urine.Marland Kitchen      COVID-19 virus infection/pneumonia   Acute respiratory failure with hypoxia (HCC) - Patient with fever 102.41-day of cough and positive home COVID test with PCR positive on arrival, O2 sat 84% on room air in the ER - Chest x-ray, Perihilar interstitial opacities and bronchial wall thickening,which  may reflect viral pneumonia - Continue remdesivir and steroids, albuterol, antitussives and vitamins - We will check inflammatory biomarkers - Supplemental oxygen  Complicated UTI (urinary tract infection) Chronic indwelling Foley catheter - Patient with a fever, foul-smelling urine and strongly positive UA - IV Rocephin - Follow cultures - Had Foley exchanged 2 days prior to arrival. - Foley catheter care    Hypertension - BP soft so we will hold lisinopril and Coreg for tonight    Chronic diastolic CHF (congestive heart failure) (HCC)   NICM (nonischemic cardiomyopathy) (HCC) - Holding Coreg and lisinopril due to soft blood pressure - Last EF 60% October 2021, was previously 35 to 40% in 2009    Type 2 diabetes mellitus with hyperlipidemia (HCC) - Sliding scale insulin coverage    CAD, nonobstructive - Continue aspirin and simvastatin and resume Coreg when BP tolerates    Spinal stenosis of lumbar region   S/P cervical spinal fusion   Cervical myelopathy (HCC)   Wheelchair/bed bound - Continue baclofen, oxycodone, Mobic - Increase nursing assistance -patient needs Hoyer lift for transfers     DVT prophylaxis: Lovenox  Code Status: full code  Family Communication:  none  Disposition Plan: Back to previous home environment Consults called: none  Status:At the time of admission, it appears that the appropriate admission status for this patient is INPATIENT. This is judged to be reasonable and necessary in order to provide the required  intensity of service to ensure the patient's safety given the presenting symptoms, physical exam findings, and initial radiographic and laboratory data in the context of their  Comorbid conditions.   Patient requires inpatient status due to high intensity of service, high risk for further deterioration and high frequency of surveillance required.   I certify that at the point of admission it is my clinical judgment that the patient will require inpatient hospital care spanning beyond 2 midnights     Andris Baumann MD [facility]     08/24/2020, 11:51 PM

## 2020-08-24 NOTE — ED Notes (Signed)
Patient's O2 sats dropping into low-mid 80's, recovers well but sats still intermittently dropping - will place on 2 L [place] for support and plan to admit to hospital. Called patient's wife, Myra, and updated her on plan of care.

## 2020-08-24 NOTE — ED Provider Notes (Signed)
[facility] Emergency Department Provider Note  ____________________________________________   I have reviewed the triage vital signs and the nursing notes.   HISTORY  Chief Complaint Fever   History limited by: Not Limited, some history obtained from wife over telephone   HPI Egon Dittus. is a 75 y.o. male who presents to the emergency department today at the advice of the triage nurse at patient's primary care clinic.  The patient's wife called primary care clinic today because the patient tested positive for COVID earlier in the day.  Patient has had cough for the past 2 days and then developed a fever this morning.  Patient's wife has not noticed that the patient has had any shortness of breath.  The patient himself denies any chest pain or shortness of breath.  Patient's wife has noticed that the patient's urine has been slightly darker over the past couple of days.  Patient does have a chronic indwelling Foley and patient's wife states it was changed a couple of days ago.  When the patient's wife called nurse triage and stated that he had a fever and tested positive for COVID they did advise that he come to the emergency department.  Records reviewed. Per medical record review patient has a history of bedbound, DM, CAD.   Past Medical History:  Diagnosis Date  . Bedbound   . Bilateral shoulder pain   . CHF (congestive heart failure) (HCC)   . Coronary artery disease   . Diabetes mellitus without complication (HCC)   . GERD (gastroesophageal reflux disease)   . Glaucoma   . HLD (hyperlipidemia)   . Hypertension   . Lumbar stenosis   . Non-ischemic cardiomyopathy (HCC)   . PVC (premature ventricular contraction)   . Sleep apnea    H/O NO CPAP IN 18 YRS    Patient Active Problem List   Diagnosis Date Noted  . Cervical myelopathy (HCC) 05/05/2020  . Post-operative state 01/31/2020  . Hypertension   . Diabetes mellitus without complication  (HCC)   . HLD (hyperlipidemia)   . Chronic diastolic CHF (congestive heart failure) (HCC)   . S/P cervical spinal fusion 01/29/2020  . Weakness   . Spinal stenosis of lumbar region   . History of progressive weakness   . Claudication (HCC)   . Lower extremity weakness 01/14/2020    Past Surgical History:  Procedure Laterality Date  . ANTERIOR CERVICAL DECOMP/DISCECTOMY FUSION N/A 01/29/2020   Procedure: ANTERIOR CERVICAL DECOMPRESSION/DISCECTOMY FUSION 1 LEVEL C3/4;  Surgeon: [name], MD;  Location: [facility] ORS;  Service: Neurosurgery;  Laterality: N/A;  . arm surgery Right    4x surgery as a child, cut arm falling through glass window   . HYDROCELE EXCISION / REPAIR    . POSTERIOR CERVICAL FUSION/FORAMINOTOMY N/A 05/06/2020   Procedure: C3-4 LAMINECTOMY & C3-4 FUSION;  Surgeon: [name], MD;  Location: [facility] ORS;  Service: Neurosurgery;  Laterality: N/A;  . REPLACEMENT TOTAL KNEE Right     Prior to Admission medications   Medication Sig Start Date End Date Taking? Authorizing Provider  acetaminophen (TYLENOL) 500 MG tablet Take 2 tablets (1,000 mg total) by mouth every 6 (six) hours. 05/08/20   Venetia Night, MD  aspirin 81 MG chewable tablet Chew 81 mg by mouth daily.    [provider]  baclofen (LIORESAL) 10 MG tablet Take 5 mg by mouth 3 (three) times daily.    [provider]  carvedilol (COREG) 6.25 MG tablet Take 6.25 mg  by mouth 2 (two) times daily. 12/31/19   [provider]  ciprofloxacin (CIPRO) 500 MG tablet Take 500 mg by mouth 2 (two) times daily. 05/01/20   [provider]  cyanocobalamin 2000 MCG tablet Take 2,000 mcg by mouth daily. 01/24/19   [provider]  famotidine (PEPCID) 20 MG tablet Take 20 mg by mouth every evening.    [provider]  gabapentin (NEURONTIN) 300 MG capsule Take 300 mg by mouth at bedtime.    [provider]  insulin aspart (NOVOLOG) 100 UNIT/ML injection Inject 4-8 Units  into the skin 3 (three) times daily with meals. Sliding scale    [provider]  insulin detemir (LEVEMIR FLEXTOUCH) 100 UNIT/ML FlexPen Inject 12 Units into the skin at bedtime.  09/15/18   [provider]  latanoprost (XALATAN) 0.005 % ophthalmic solution Place 1 drop into the right eye at bedtime. 08/20/19   [provider]  lisinopril (ZESTRIL) 10 MG tablet Take 10 mg by mouth every morning.    [provider]  meloxicam (MOBIC) 7.5 MG tablet Take 7.5 mg by mouth daily.    [provider]  Multiple Vitamins-Minerals (CENTRUM SILVER 50+MEN PO) Take 1 tablet by mouth daily.    [provider]  oxyCODONE (OXY IR/ROXICODONE) 5 MG immediate release tablet Take 1 tablet (5 mg total) by mouth every 4 (four) hours as needed for moderate pain. 05/08/20   Venetia Night, MD  polyethylene glycol powder (GLYCOLAX/MIRALAX) 17 GM/SCOOP powder Take 17 g by mouth daily as needed for moderate constipation.    [provider]  senna (SENOKOT) 8.6 MG TABS tablet Take 2 tablets (17.2 mg total) by mouth daily as needed for mild constipation. Patient not taking: Reported on 05/01/2020 02/13/20   Patsey Berthold, NP  simvastatin (ZOCOR) 20 MG tablet Take 20 mg by mouth daily at 6 PM.    [provider]  tamsulosin (FLOMAX) 0.4 MG CAPS capsule Take 1 capsule (0.4 mg total) by mouth daily after supper. 01/25/20   Wouk, Wilfred Curtis, MD    Allergies Patient has no known allergies.  No family history on file.  Social History Social History   Tobacco Use  . Smoking status: Never Smoker  . Smokeless tobacco: Never Used  Vaping Use  . Vaping Use: Never used  Substance Use Topics  . Alcohol use: No  . Drug use: Never    Review of Systems Constitutional: Positive for fever. Eyes: No visual changes. ENT: No sore throat. Cardiovascular: Denies chest pain. Respiratory: Denies shortness of breath. Positive for cough. Gastrointestinal: No  abdominal pain.  No nausea, no vomiting.  No diarrhea.   Genitourinary: Negative for dysuria. Positive for darker urine. Musculoskeletal: Negative for back pain. Skin: Negative for rash. Neurological: Negative for headaches, focal weakness or numbness.  ____________________________________________   PHYSICAL EXAM:  VITAL SIGNS: ED Triage Vitals [08/24/20 2047]  Enc Vitals Group     BP 101/62     Pulse Rate 68     Resp 18     Temp 98.4 F (36.9 C)     Temp Source Oral     SpO2 94 %     Weight 241 lb (109.3 kg)     Height 6\' 3"  (1.905 m)     Head Circumference      Peak Flow      Pain Score 0   Constitutional: Awake and alert.  Eyes: Conjunctivae are normal.  ENT      Head:  Normocephalic and atraumatic.      Nose: No congestion/rhinnorhea.      Mouth/Throat: Mucous membranes are moist.      Neck: No stridor. Hematological/Lymphatic/Immunilogical: No cervical lymphadenopathy. Cardiovascular: Normal rate, regular rhythm.  No murmurs, rubs, or gallops.  Respiratory: Normal respiratory effort without tachypnea nor retractions. Breath sounds are clear and equal bilaterally. No wheezes/rales/rhonchi. Gastrointestinal: Soft and non tender. No rebound. No guarding.  Genitourinary: Indwelling foley catheter Musculoskeletal: Normal range of motion in all extremities. No lower extremity edema. Neurologic:  Normal speech and language. No gross focal neurologic deficits are appreciated.  Skin:  Skin is warm, dry and intact. No rash noted. Psychiatric: Mood and affect are normal. Speech and behavior are normal. Patient exhibits appropriate insight and judgment.  ____________________________________________    LABS (pertinent positives/negatives)  COVID positive CBC wbc 10.6, hgb 11.3, plt 145 UA cloudy, positive nitrite, large leukocytes, wbc >50 CMP na 134, k 4.3, glu 147, cr  0.77  ____________________________________________   PROCEDURES  Procedures  ____________________________________________   INITIAL IMPRESSION / ASSESSMENT AND PLAN / ED COURSE  Pertinent labs & imaging results that were available during my care of the patient were reviewed by me and considered in my medical decision making (see chart for details).   Patient sent to the emergency department today because of concerns for fever in the setting of COVID positive test at home.  On exam here patient in no respiratory distress.  However while on monitor he was found to drop his oxygen saturation to 87% on room air.  Because of this patient was placed on nasal cannula.  Patient was ordered steroids and remdesivir.  Will plan on admission to the hospital service for further work-up and evaluation.  Furthermore patient's wife was concerned that his urine is changed color recently.  Are some findings in the urine concerning for possible urinary tract infection thus antibiotics were started. ____________________________________________   FINAL CLINICAL IMPRESSION(S) / ED DIAGNOSES  Final diagnoses:  COVID-19  Lower urinary tract infectious disease  Hypoxia     Note: This dictation was prepared with Dragon dictation. Any transcriptional errors that result from this process are unintentional     Phineas Semen, MD 08/24/20 2310

## 2020-08-25 ENCOUNTER — Other Ambulatory Visit: Payer: Self-pay

## 2020-08-25 ENCOUNTER — Encounter: Payer: Self-pay | Admitting: Internal Medicine

## 2020-08-25 LAB — C DIFFICILE QUICK SCREEN W PCR REFLEX
C Diff antigen: NEGATIVE
C Diff interpretation: NOT DETECTED
C Diff toxin: NEGATIVE

## 2020-08-25 LAB — GLUCOSE, CAPILLARY
Glucose-Capillary: 160 mg/dL — ABNORMAL HIGH (ref 70–99)
Glucose-Capillary: 164 mg/dL — ABNORMAL HIGH (ref 70–99)
Glucose-Capillary: 163 mg/dL — ABNORMAL HIGH (ref 70–99)
Glucose-Capillary: 159 mg/dL — ABNORMAL HIGH (ref 70–99)
Glucose-Capillary: 157 mg/dL — ABNORMAL HIGH (ref 70–99)

## 2020-08-25 LAB — PROCALCITONIN: Procalcitonin: <0.1 ng/mL

## 2020-08-25 LAB — HEMOGLOBIN A1C
Hgb A1c MFr Bld: 6.1 % — ABNORMAL HIGH (ref 4.8–5.6)
Mean Plasma Glucose: 128.37 mg/dL

## 2020-08-25 LAB — C-REACTIVE PROTEIN: CRP: 10 mg/dL — ABNORMAL HIGH (ref <1.0)

## 2020-08-25 LAB — D-DIMER, QUANTITATIVE: D-Dimer, Quant: 0.92 ug/mL-FEU — ABNORMAL HIGH (ref 0.00–0.50)

## 2020-08-25 MED ORDER — ZINC OXIDE 40 % EX OINT
TOPICAL_OINTMENT | CUTANEOUS | Status: DC | PRN
  Filled 2020-08-25: qty 113

## 2020-08-25 MED ORDER — BACLOFEN 10 MG PO TABS
5.0000 mg | ORAL_TABLET | Freq: Three times a day (TID) | ORAL | Status: DC
  Administered 2020-08-25 – 2020-08-27 (×6): 5 mg via ORAL
  Filled 2020-08-25 (×9): qty 0.5

## 2020-08-25 MED ORDER — CHLORHEXIDINE GLUCONATE CLOTH 2 % EX PADS
6.0000 | MEDICATED_PAD | Freq: Every day | CUTANEOUS | Status: DC
  Administered 2020-08-25 – 2020-08-27 (×3): 6 via TOPICAL

## 2020-08-25 MED ORDER — SODIUM CHLORIDE 0.9 % IV SOLN
500.0000 mg | INTRAVENOUS | Status: DC
  Administered 2020-08-25: 10:00:00 500 mg via INTRAVENOUS
  Filled 2020-08-25: qty 500

## 2020-08-25 NOTE — Progress Notes (Signed)
PROGRESS NOTE    Roy [name].  DJM:[id number] DOB: 1945/07/18 DOA: 08/24/2020 PCP: Rigoberto Noel, MD    Brief Narrative:  Roy [name]. is a 75 y.o. male with medical history significant for HTN, DM, nonobstructive CAD, nonischemic cardiomyopathy with normalized EF 60% 01/2020, bed-bound secondary to lumbar stenosis and cervical myelopathy status post cervical spinal fusion February 2022 and who also has indwelling Foley catheter, who was sent to the emergency room for evaluation of fever of 102.4, cough x1 day, with home positive COVID test.    Foley was last changed by home health 08/05/20..  he has obstructive sleep apnea but does not tolerate the mask UA+. PCR covid +.   Consultants:     Procedures:   Antimicrobials:   Ceftriaxone, Remdesivir    Subjective: Feeling "fine". Denies sob, cp, abd pain.   Objective: Vitals:   08/24/20 2330 08/25/20 0106 08/25/20 0212 08/25/20 0519  BP: 105/70 (!) 149/106  106/63  Pulse: 69 70  67  Resp: 20 17  18   Temp:  98.2 F (36.8 C)  (!) 97.4 F (36.3 C)  TempSrc:      SpO2: 98% 96%  100%  Weight:   108.3 kg   Height:   6\' 3"  (1.905 m)     Intake/Output Summary (Last 24 hours) at 08/25/2020 0805 Last data filed at 08/25/2020 0519 Gross per 24 hour  Intake 1000 ml  Output 300 ml  Net 700 ml   Filed Weights   08/24/20 2047 08/25/20 0212  Weight: 109.3 kg 108.3 kg    Examination:  General exam: Appears calm and comfortable  Respiratory system: Clear to auscultation. Respiratory effort normal. Cardiovascular system: S1 & S2 heard, RRR. No JVD, murmurs, rubs, gallops or clicks. Gastrointestinal system: Abdomen is nondistended, soft and nontender.. Normal bowel sounds heard. Central nervous system: Alert and oriented.  Extremities: No edema Skin: Warm dry Psychiatry Mood & affect appropriate in current setting.     Data Reviewed: I have personally reviewed following labs and imaging  studies  CBC: Recent Labs  Lab 08/24/20 2051  WBC 10.6*  NEUTROABS 8.2*  HGB 11.3*  HCT 34.2*  MCV 93.2  PLT 145*   Basic Metabolic Panel: Recent Labs  Lab 08/24/20 2051  NA 134*  K 4.3  CL 100  CO2 26  GLUCOSE 147*  BUN 38*  CREATININE 0.77  CALCIUM 8.9   GFR: Estimated Creatinine Clearance: 107.7 mL/min (by C-G formula based on SCr of 0.77 mg/dL). Liver Function Tests: Recent Labs  Lab 08/24/20 2051  AST 18  ALT 21  ALKPHOS 79  BILITOT 0.7  PROT 6.2*  ALBUMIN 3.3*   No results for input(s): LIPASE, AMYLASE in the last 168 hours. No results for input(s): AMMONIA in the last 168 hours. Coagulation Profile: No results for input(s): INR, PROTIME in the last 168 hours. Cardiac Enzymes: No results for input(s): CKTOTAL, CKMB, CKMBINDEX, TROPONINI in the last 168 hours. BNP (last 3 results) No results for input(s): PROBNP in the last 8760 hours. HbA1C: Recent Labs    08/25/20 0122  HGBA1C 6.1*   CBG: Recent Labs  Lab 08/25/20 0204  GLUCAP 160*   Lipid Profile: No results for input(s): CHOL, HDL, LDLCALC, TRIG, CHOLHDL, LDLDIRECT in the last 72 hours. Thyroid Function Tests: No results for input(s): TSH, T4TOTAL, FREET4, T3FREE, THYROIDAB in the last 72 hours. Anemia Panel: No results for input(s): VITAMINB12, FOLATE, FERRITIN, TIBC, IRON, RETICCTPCT in the last 72 hours. Sepsis  Labs: No results for input(s): PROCALCITON, LATICACIDVEN in the last 168 hours.  Recent Results (from the past 240 hour(s))  Resp Panel by RT-PCR (Flu A&B, Covid) Nasopharyngeal Swab     Status: Abnormal   Collection Time: 08/24/20  8:51 PM   Specimen: Nasopharyngeal Swab; Nasopharyngeal(NP) swabs in vial transport medium  Result Value Ref Range Status   SARS Coronavirus 2 by RT PCR POSITIVE (A) NEGATIVE Final    Comment: RESULT CALLED TO, READ BACK BY AND VERIFIED WITH: TAYLOR LEWIS ON 08/24/20 AT 2301 QSD (NOTE) SARS-CoV-2 target nucleic acids are DETECTED.  The  SARS-CoV-2 RNA is generally detectable in upper respiratory specimens during the acute phase of infection. Positive results are indicative of the presence of the identified virus, but do not rule out bacterial infection or co-infection with other pathogens not detected by the test. Clinical correlation with patient history and other diagnostic information is necessary to determine patient infection status. The expected result is Negative.  Fact Sheet for Patients: BloggerCourse.com  Fact Sheet for Healthcare Providers: SeriousBroker.it  This test is not yet approved or cleared by the [place] FDA and  has been authorized for detection and/or diagnosis of SARS-CoV-2 by FDA under an Emergency Use Authorization (EUA).  This EUA will remain in effect (meaning this test can  be used) for the duration of  the COVID-19 declaration under Section 564(b)(1) of the Act, 21 U.S.C. section 360bbb-3(b)(1), unless the authorization is terminated or revoked sooner.     Influenza A by PCR NEGATIVE NEGATIVE Final   Influenza B by PCR NEGATIVE NEGATIVE Final    Comment: (NOTE) The Xpert Xpress SARS-CoV-2/FLU/RSV plus assay is intended as an aid in the diagnosis of influenza from Nasopharyngeal swab specimens and should not be used as a sole basis for treatment. Nasal washings and aspirates are unacceptable for Xpert Xpress SARS-CoV-2/FLU/RSV testing.  Fact Sheet for Patients: BloggerCourse.com  Fact Sheet for Healthcare Providers: SeriousBroker.it  This test is not yet approved or cleared by the [place] FDA and has been authorized for detection and/or diagnosis of SARS-CoV-2 by FDA under an Emergency Use Authorization (EUA). This EUA will remain in effect (meaning this test can be used) for the duration of the COVID-19 declaration under Section 564(b)(1) of the Act, 21 U.S.C. section  360bbb-3(b)(1), unless the authorization is terminated or revoked.  Performed at [facility], [address], [place], [place] [address]          Radiology Studies: DG Chest Portable 1 View  Result Date: 08/24/2020 CLINICAL DATA:  Cough x1 day with positive home COVID test EXAM: PORTABLE CHEST 1 VIEW COMPARISON:  January 14, 2020 FINDINGS: Mild cardiac enlargement likely accentuated by technique. Perihilar interstitial opacities and bronchial wall thickening. No visible pleural effusion or pneumothorax. No acute osseous abnormality. IMPRESSION: Perihilar interstitial opacities and bronchial wall thickening, which may reflect viral pneumonia. Electronically Signed   By: Maudry Mayhew MD   On: 08/24/2020 23:44        Scheduled Meds: . albuterol  2 puff Inhalation Q6H  . vitamin C  500 mg Oral Daily  . dexamethasone (DECADRON) injection  6 mg Intravenous Q24H  . enoxaparin (LOVENOX) injection  40 mg Subcutaneous Q24H  . insulin aspart  0-15 Units Subcutaneous TID WC  . insulin aspart  0-5 Units Subcutaneous QHS  . zinc sulfate  220 mg Oral Daily   Continuous Infusions: . azithromycin    . cefTRIAXone (ROCEPHIN)  IV 1 g (08/25/20 0207)  .  remdesivir 100 mg in NS 100 mL      Assessment & Plan:   Principal Problem:   Complicated UTI (urinary tract infection) Active Problems:   Spinal stenosis of lumbar region   S/P cervical spinal fusion   Hypertension   Chronic diastolic CHF (congestive heart failure) (HCC)   Cervical myelopathy (HCC)   Chronic indwelling Foley catheter   Wheelchair bound   COVID-19 virus infection   Acute respiratory failure with hypoxia (HCC)   NICM (nonischemic cardiomyopathy) (HCC)   CAD (coronary artery disease)   Type 2 diabetes mellitus with hyperlipidemia (HCC)   75 year old male with history of HTN, DM, nonobstructive CAD, nonischemic cardiomyopathy with normalized EF 60% 01/2020, bed-bound secondary to lumbar stenosis and  cervical myelopathy status post cervical spinal fusion February 2022 with chronic indwelling Foley catheter, presenting with fever of 102.4, cough x1 day, with home positive COVID test.  Also foul-smelling urine.Marland Kitchen      COVID-19 virus infection/pneumonia   Acute respiratory failure with hypoxia (HCC) - Patient with fever 102.41-day of cough and positive home COVID test with PCR positive on arrival, O2 sat 84% on room air in the ER - Chest x-ray, Perihilar interstitial opacities and bronchial wall thickening,which may reflect viral pneumonia 5/16-Procal. <0.10 Crp up 10 continue remdesivir and steroids Continue albuterol, antitussive Continue vitamin C and zink    Complicated UTI (urinary tract infection) Chronic indwelling Foley catheter - Patient with a fever, foul-smelling urine and strongly positive UA Foley was changed on 4/26 per wife.  She reports his urologist changes in once a month. Continue Rocephin Follow-up cultures     Hypertension BP on soft side, hold lisinopril and Coreg     Chronic diastolic CHF (congestive heart failure) (HCC)   NICM (nonischemic cardiomyopathy) (HCC) - Last EF 60% October 2021, was previously 35 to 40% in 2009 5/16 Without acute exacerbation Continue to hold Coreg and lisinopril as BP on low side     Type 2 diabetes mellitus with hyperlipidemia (HCC) BG stable RISS    CAD, nonobstructive - Continue aspirin and simvastatin and resume Coreg when BP tolerates    Spinal stenosis of lumbar region   S/P cervical spinal fusion   Cervical myelopathy (HCC)   Wheelchair/bed bound - Continue baclofen, oxycodone,  - Increase nursing assistance -patient needs Hoyer lift for transfers    DVT prophylaxis: Lovenox Code Status: Full Family Communication: Wife updated  Status is: Inpatient  Remains inpatient appropriate because:Inpatient level of care appropriate due to severity of illness   Dispo: The patient is from: Home               Anticipated d/c is to: Home              Patient currently is not medically stable to d/c.   Difficult to place patient No            LOS: 1 day   Time spent: 35 minutes with more than 50% on COC    Lynn Ito, MD [facility] Pager [phone]  If 7PM-7AM, please contact night-coverage 08/25/2020, 8:05 AM

## 2020-08-25 NOTE — ED Notes (Signed)
Pt cleaned of diarrhea at this time.

## 2020-08-25 NOTE — Progress Notes (Signed)
Remdesivir - Pharmacy Brief Note   O:  ALT: 21  CXR:  SpO2: mid 80's % on RA    A/P:  Remdesivir 200 mg IVPB once followed by 100 mg IVPB daily x 4 days.   Helaine Yackel D 08/25/2020 12:00 AM

## 2020-08-26 LAB — URINE CULTURE

## 2020-08-26 LAB — GLUCOSE, CAPILLARY
Glucose-Capillary: 182 mg/dL — ABNORMAL HIGH (ref 70–99)
Glucose-Capillary: 203 mg/dL — ABNORMAL HIGH (ref 70–99)
Glucose-Capillary: 190 mg/dL — ABNORMAL HIGH (ref 70–99)
Glucose-Capillary: 182 mg/dL — ABNORMAL HIGH (ref 70–99)

## 2020-08-26 LAB — PROCALCITONIN: Procalcitonin: <0.1 ng/mL

## 2020-08-26 MED ORDER — LATANOPROST 0.005 % OP SOLN
1.0000 [drp] | Freq: Every day | OPHTHALMIC | Status: DC
  Administered 2020-08-26: 1 [drp] via OPHTHALMIC
  Filled 2020-08-26: qty 2.5

## 2020-08-26 MED ORDER — TAMSULOSIN HCL 0.4 MG PO CAPS
0.4000 mg | ORAL_CAPSULE | Freq: Every day | ORAL | Status: DC
  Administered 2020-08-26: 0.4 mg via ORAL
  Filled 2020-08-26: qty 1

## 2020-08-26 MED ORDER — SODIUM CHLORIDE 0.9 % IV SOLN
INTRAVENOUS | Status: DC | PRN
  Administered 2020-08-26: 500 mL via INTRAVENOUS

## 2020-08-26 NOTE — Plan of Care (Signed)
  Problem: Education: Goal: Knowledge of risk factors and measures for prevention of condition will improve Outcome: Progressing   Problem: Coping: Goal: Psychosocial and spiritual needs will be supported Outcome: Progressing   Problem: Respiratory: Goal: Will maintain a patent airway Outcome: Progressing Goal: Complications related to the disease process, condition or treatment will be avoided or minimized Outcome: Progressing   Problem: Clinical Measurements: Goal: Respiratory complications will improve Outcome: Progressing   Problem: Coping: Goal: Level of anxiety will decrease Outcome: Progressing   Problem: Pain Managment: Goal: General experience of comfort will improve Outcome: Progressing   Problem: Safety: Goal: Ability to remain free from injury will improve Outcome: Progressing   Problem: Skin Integrity: Goal: Risk for impaired skin integrity will decrease Outcome: Progressing

## 2020-08-26 NOTE — TOC Initial Note (Signed)
Transition of Care (TOC) - Initial/Assessment Note    Patient Details  Name: Roy [name]. MRN: [id number] Date of Birth: 05-Oct-1945  Transition of Care [facility]) CM/SW Contact:    Allayne Butcher, RN Phone Number: 08/26/2020, 4:04 PM  Clinical Narrative:                 Patient admitted to the hospital with complicated UTI also found to be COVID positive.  Patient is from home with his wife, Roy [name].  RNCM spoke with patient's wife Roy [name].  Patient is bed bound at baseline and has been since December of this past year, she takes care of him at home.  They have a hospital bed and hoyer lift.  She takes him to appointments by renting a handicap [employer] and using the lift to get him in and out of the [place].  Patient has a chronic foley.  Active with [facility] for RN and PT.  Plan is for patient to return home at discharge with wife.  Patient will need EMS transport home.   Expected Discharge Plan: Home w Home Health Services Barriers to Discharge: Continued Medical Work up   Patient Goals and CMS Choice Patient states their goals for this hospitalization and ongoing recovery are:: Wife would like for patient to return home with her CMS Medicare.gov Compare Post Acute Care list provided to:: Patient Represenative (must comment) Choice offered to / list presented to : Spouse  Expected Discharge Plan and Services Expected Discharge Plan: Home w Home Health Services   Discharge Planning Services: CM Consult Post Acute Care Choice: Home Health Living arrangements for the past 2 months: Single Family Home                 DME Arranged: N/A DME Agency: NA       HH Arranged: RN,PT HH Agency: [facility]        Prior Living Arrangements/Services Living arrangements for the past 2 months: Single Family Home Lives with:: Spouse Patient language and need for interpreter reviewed:: Yes Do you feel safe going back to the place where you live?: Yes      Need for Family Participation in  Patient Care: Yes (Comment) (total care) Care giver support system in place?: Yes (comment) (wife) Current home services: DME,Home PT,Home RN (hospital bed, lift) Criminal Activity/Legal Involvement Pertinent to Current Situation/Hospitalization: No - Comment as needed  Activities of Daily Living Home Assistive Devices/Equipment: Wheelchair ADL Screening (condition at time of admission) Patient's cognitive ability adequate to safely complete daily activities?: Yes Is the patient deaf or have difficulty hearing?: No Does the patient have difficulty seeing, even when wearing glasses/contacts?: Yes Does the patient have difficulty concentrating, remembering, or making decisions?: No Patient able to express need for assistance with ADLs?: Yes Does the patient have difficulty dressing or bathing?: Yes Independently performs ADLs?: No Communication: Independent Dressing (OT): Needs assistance Is this a change from baseline?: Pre-admission baseline Grooming: Needs assistance Is this a change from baseline?: Pre-admission baseline Feeding: Needs assistance Is this a change from baseline?: Pre-admission baseline Bathing: Needs assistance Is this a change from baseline?: Pre-admission baseline Toileting: Needs assistance Is this a change from baseline?: Pre-admission baseline In/Out Bed: Needs assistance Is this a change from baseline?: Pre-admission baseline Walks in Home: Dependent Is this a change from baseline?: Pre-admission baseline Does the patient have difficulty walking or climbing stairs?: Yes Weakness of Legs: Both Weakness of Arms/Hands: Both  Permission Sought/Granted Permission sought to share information  with : Case Manager,Family Supports,Other (comment) Permission granted to share information with : Yes, Verbal Permission Granted  Share Information with NAME: Roy [name]  Permission granted to share info w AGENCY: [facility]  Permission granted to share info w Relationship:  wife     Emotional Assessment         Alcohol / Substance Use: Not Applicable Psych Involvement: No (comment)  Admission diagnosis:  Lower urinary tract infectious disease [N39.0] Hypoxia [R09.02] Fever [R50.9] COVID-19 [U07.1] Patient Active Problem List   Diagnosis Date Noted  . Chronic indwelling Foley catheter 08/24/2020  . Wheelchair bound 08/24/2020  . Complicated UTI (urinary tract infection) 08/24/2020  . COVID-19 virus infection 08/24/2020  . Acute respiratory failure with hypoxia (HCC) 08/24/2020  . NICM (nonischemic cardiomyopathy) (HCC) 08/24/2020  . CAD (coronary artery disease) 08/24/2020  . Type 2 diabetes mellitus with hyperlipidemia (HCC) 08/24/2020  . Cervical myelopathy (HCC) 05/05/2020  . Post-operative state 01/31/2020  . Hypertension   . Diabetes mellitus without complication (HCC)   . HLD (hyperlipidemia)   . Chronic diastolic CHF (congestive heart failure) (HCC)   . S/P cervical spinal fusion 01/29/2020  . Weakness   . Spinal stenosis of lumbar region   . History of progressive weakness   . Claudication (HCC)   . Lower extremity weakness 01/14/2020   PCP:  Rigoberto Noel, MD Pharmacy:   [facility] [address], [place] - [address] [place] [address] Phone: [phone] Fax: [phone]     Social Determinants of Health (SDOH) Interventions    Readmission Risk Interventions No flowsheet data found.

## 2020-08-26 NOTE — Progress Notes (Signed)
Pt was on 2L O2 acute Saturation during shift maintained above 95% Pt requested to get some rest from oxygen supplementation Pt is now on room air, will monitor his saturation

## 2020-08-26 NOTE — Progress Notes (Signed)
PROGRESS NOTE    Roy [name].  Roy [name] DOB: 1945-09-30 DOA: 08/24/2020 PCP: Rigoberto Noel, MD    Brief Narrative:  Roy Duve. is a 75 y.o. male with medical history significant for HTN, DM, nonobstructive CAD, nonischemic cardiomyopathy with normalized EF 60% 01/2020, bed-bound secondary to lumbar stenosis and cervical myelopathy status post cervical spinal fusion February 2022 and who also has indwelling Foley catheter, who was sent to the emergency room for evaluation of fever of 102.4, cough x1 day, with home positive COVID test.    Foley was last changed by home health 08/05/20..  he has obstructive sleep apnea but does not tolerate the mask UA+. PCR covid +.  Getting treatment for covid infection with Remdesivir and steroid. Weaned off 02, 02 sat 91 and up. He is bed bound .   Consultants:     Procedures:   Antimicrobials:   Ceftriaxone, Remdesivir    Subjective: Pt reports feeling better, cough improving. No sob while lying in bed.   Objective: Vitals:   08/25/20 2033 08/25/20 2347 08/26/20 0502 08/26/20 0743  BP: 131/70 131/79 114/68 130/83  Pulse: (!) 57 62 66 69  Resp: 17 16 16 18   Temp: 97.7 F (36.5 C) 97.9 F (36.6 C) 97.7 F (36.5 C) 97.8 F (36.6 C)  TempSrc:    Oral  SpO2: 100% 100% 98% 99%  Weight:      Height:        Intake/Output Summary (Last 24 hours) at 08/26/2020 0749 Last data filed at 08/26/2020 0502 Gross per 24 hour  Intake 739.82 ml  Output 2950 ml  Net -2210.18 ml   Filed Weights   08/24/20 2047 08/25/20 0212  Weight: 109.3 kg 108.3 kg    Examination: Calm, NAD CTA no wheeze rales rhonchi's Regular S1-S2 no gallops Soft benign positive breath sounds No edema Awake and alert Mood and affect appropriate in current setting    Data Reviewed: I have personally reviewed following labs and imaging studies  CBC: Recent Labs  Lab 08/24/20 2051  WBC 10.6*  NEUTROABS 8.2*  HGB 11.3*  HCT 34.2*   MCV 93.2  PLT 145*   Basic Metabolic Panel: Recent Labs  Lab 08/24/20 2051  NA 134*  K 4.3  CL 100  CO2 26  GLUCOSE 147*  BUN 38*  CREATININE 0.77  CALCIUM 8.9   GFR: Estimated Creatinine Clearance: 107.7 mL/min (by C-G formula based on SCr of 0.77 mg/dL). Liver Function Tests: Recent Labs  Lab 08/24/20 2051  AST 18  ALT 21  ALKPHOS 79  BILITOT 0.7  PROT 6.2*  ALBUMIN 3.3*   No results for input(s): LIPASE, AMYLASE in the last 168 hours. No results for input(s): AMMONIA in the last 168 hours. Coagulation Profile: No results for input(s): INR, PROTIME in the last 168 hours. Cardiac Enzymes: No results for input(s): CKTOTAL, CKMB, CKMBINDEX, TROPONINI in the last 168 hours. BNP (last 3 results) No results for input(s): PROBNP in the last 8760 hours. HbA1C: Recent Labs    08/25/20 0122  HGBA1C 6.1*   CBG: Recent Labs  Lab 08/25/20 0913 08/25/20 1152 08/25/20 1642 08/25/20 2036 08/26/20 0744  GLUCAP 164* 163* 159* 157* 182*   Lipid Profile: No results for input(s): CHOL, HDL, LDLCALC, TRIG, CHOLHDL, LDLDIRECT in the last 72 hours. Thyroid Function Tests: No results for input(s): TSH, T4TOTAL, FREET4, T3FREE, THYROIDAB in the last 72 hours. Anemia Panel: No results for input(s): VITAMINB12, FOLATE, FERRITIN, TIBC, IRON, RETICCTPCT in  the last 72 hours. Sepsis Labs: Recent Labs  Lab 08/25/20 0826 08/26/20 0511  PROCALCITON <0.10 <0.10    Recent Results (from the past 240 hour(s))  Resp Panel by RT-PCR (Flu A&B, Covid) Nasopharyngeal Swab     Status: Abnormal   Collection Time: 08/24/20  8:51 PM   Specimen: Nasopharyngeal Swab; Nasopharyngeal(NP) swabs in vial transport medium  Result Value Ref Range Status   SARS Coronavirus 2 by RT PCR POSITIVE (A) NEGATIVE Final    Comment: RESULT CALLED TO, READ BACK BY AND VERIFIED WITH: TAYLOR LEWIS ON 08/24/20 AT 2301 QSD (NOTE) SARS-CoV-2 target nucleic acids are DETECTED.  The SARS-CoV-2 RNA is  generally detectable in upper respiratory specimens during the acute phase of infection. Positive results are indicative of the presence of the identified virus, but do not rule out bacterial infection or co-infection with other pathogens not detected by the test. Clinical correlation with patient history and other diagnostic information is necessary to determine patient infection status. The expected result is Negative.  Fact Sheet for Patients: BloggerCourse.com  Fact Sheet for Healthcare Providers: SeriousBroker.it  This test is not yet approved or cleared by the [place] FDA and  has been authorized for detection and/or diagnosis of SARS-CoV-2 by FDA under an Emergency Use Authorization (EUA).  This EUA will remain in effect (meaning this test can  be used) for the duration of  the COVID-19 declaration under Section 564(b)(1) of the Act, 21 U.S.C. section 360bbb-3(b)(1), unless the authorization is terminated or revoked sooner.     Influenza A by PCR NEGATIVE NEGATIVE Final   Influenza B by PCR NEGATIVE NEGATIVE Final    Comment: (NOTE) The Xpert Xpress SARS-CoV-2/FLU/RSV plus assay is intended as an aid in the diagnosis of influenza from Nasopharyngeal swab specimens and should not be used as a sole basis for treatment. Nasal washings and aspirates are unacceptable for Xpert Xpress SARS-CoV-2/FLU/RSV testing.  Fact Sheet for Patients: BloggerCourse.com  Fact Sheet for Healthcare Providers: SeriousBroker.it  This test is not yet approved or cleared by the [place] FDA and has been authorized for detection and/or diagnosis of SARS-CoV-2 by FDA under an Emergency Use Authorization (EUA). This EUA will remain in effect (meaning this test can be used) for the duration of the COVID-19 declaration under Section 564(b)(1) of the Act, 21 U.S.C. section 360bbb-3(b)(1),  unless the authorization is terminated or revoked.  Performed at [facility], [address] [place], [place] [address]   C Difficile Quick Screen w PCR reflex     Status: None   Collection Time: 08/25/20 10:01 AM   Specimen: STOOL  Result Value Ref Range Status   C Diff antigen NEGATIVE NEGATIVE Final   C Diff toxin NEGATIVE NEGATIVE Final   C Diff interpretation No C. difficile detected.  Final    Comment: Performed at [facility], [address] [place], [place] [address]         Radiology Studies: DG Chest Portable 1 View  Result Date: 08/24/2020 CLINICAL DATA:  Cough x1 day with positive home COVID test EXAM: PORTABLE CHEST 1 VIEW COMPARISON:  January 14, 2020 FINDINGS: Mild cardiac enlargement likely accentuated by technique. Perihilar interstitial opacities and bronchial wall thickening. No visible pleural effusion or pneumothorax. No acute osseous abnormality. IMPRESSION: Perihilar interstitial opacities and bronchial wall thickening, which may reflect viral pneumonia. Electronically Signed   By: Maudry Mayhew MD   On: 08/24/2020 23:44        Scheduled Meds: .  albuterol  2 puff Inhalation Q6H  . vitamin C  500 mg Oral Daily  . baclofen  5 mg Oral TID  . Chlorhexidine Gluconate Cloth  6 each Topical Daily  . dexamethasone (DECADRON) injection  6 mg Intravenous Q24H  . enoxaparin (LOVENOX) injection  40 mg Subcutaneous Q24H  . insulin aspart  0-15 Units Subcutaneous TID WC  . insulin aspart  0-5 Units Subcutaneous QHS  . zinc sulfate  220 mg Oral Daily   Continuous Infusions: . cefTRIAXone (ROCEPHIN)  IV 1 g (08/26/20 0013)  . remdesivir 100 mg in NS 100 mL Stopped (08/25/20 1328)    Assessment & Plan:   Principal Problem:   Complicated UTI (urinary tract infection) Active Problems:   Spinal stenosis of lumbar region   S/P cervical spinal fusion   Hypertension   Chronic diastolic CHF (congestive heart failure) (HCC)   Cervical  myelopathy (HCC)   Chronic indwelling Foley catheter   Wheelchair bound   COVID-19 virus infection   Acute respiratory failure with hypoxia (HCC)   NICM (nonischemic cardiomyopathy) (HCC)   CAD (coronary artery disease)   Type 2 diabetes mellitus with hyperlipidemia (HCC)   75 year old male with history of HTN, DM, nonobstructive CAD, nonischemic cardiomyopathy with normalized EF 60% 01/2020, bed-bound secondary to lumbar stenosis and cervical myelopathy status post cervical spinal fusion February 2022 with chronic indwelling Foley catheter, presenting with fever of 102.4, cough x1 day, with home positive COVID test.  Also foul-smelling urine.Marland Kitchen      COVID-19 virus infection/pneumonia   Acute respiratory failure with hypoxia (HCC) - Patient with fever 102.41-day of cough and positive home COVID test with PCR positive on arrival, O2 sat 84% on room air in the ER - Chest x-ray, Perihilar interstitial opacities and bronchial wall thickening,which may reflect viral pneumonia Procal. <0.10 5/17 CRP elevated at 10, will trend Continue remdesivir Continue steroids Continue albuterol as needed, antitussives Continue vitamin C and zinc      UTI (urinary tract infection) Chronic indwelling Foley catheter - Patient with a fever, foul-smelling urine and strongly positive UA Foley was changed on 4/26 per wife.  She reports his urologist changes in once a month. 5/17-urine culture with multiple organisms However will continue IV Rocephin for now      Hypertension Holding lisinopril and Coreg due to BP being on soft side to normatensive     Chronic diastolic CHF (congestive heart failure) (HCC)   NICM (nonischemic cardiomyopathy) (HCC) - Last EF 60% October 2021, was previously 35 to 40% in 2009 Without acute exacerbation Holding Coreg and lisinopril as above       Type 2 diabetes mellitus with hyperlipidemia (HCC) BG overall stable Continue R-ISS    CAD, nonobstructive -  Continue aspirin and simvastatin and resume Coreg when BP tolerates    Spinal stenosis of lumbar region   S/P cervical spinal fusion   Cervical myelopathy (HCC)   Wheelchair/bed bound - Continue baclofen, oxycodone,  - Increase nursing assistance -patient needs Hoyer lift for transfers    DVT prophylaxis: Lovenox Code Status: Full Family Communication: Wife updated  Status is: Inpatient  Remains inpatient appropriate because:Inpatient level of care appropriate due to severity of illness   Dispo: The patient is from: Home              Anticipated d/c is to: Home              Patient currently is not medically stable to d/c.   Difficult to  place patient No   Anticipated discharge: Likely in a.m. if medically stable after receiving his third dose of remdesivir.  Patient is bedbound will need transportation and need to coordinate this with case manager         LOS: 2 days   Time spent: 35 minutes with more than 50% on COC    Lynn Ito, MD [facility] Pager [phone]  If 7PM-7AM, please contact night-coverage 08/26/2020, 7:49 AM

## 2020-08-27 LAB — CBC
HCT: 38.6 % — ABNORMAL LOW (ref 39.0–52.0)
Hemoglobin: 13.1 g/dL (ref 13.0–17.0)
MCH: 30.5 pg (ref 26.0–34.0)
MCHC: 33.9 g/dL (ref 30.0–36.0)
MCV: 90 fL (ref 80.0–100.0)
Platelets: 192 10*3/uL (ref 150–400)
RBC: 4.29 MIL/uL (ref 4.22–5.81)
RDW: 14.4 % (ref 11.5–15.5)
WBC: 5.7 10*3/uL (ref 4.0–10.5)
nRBC: 0 % (ref 0.0–0.2)

## 2020-08-27 LAB — BASIC METABOLIC PANEL
Anion gap: 7 (ref 5–15)
BUN: 27 mg/dL — ABNORMAL HIGH (ref 8–23)
CO2: 26 mmol/L (ref 22–32)
Calcium: 9.3 mg/dL (ref 8.9–10.3)
Chloride: 104 mmol/L (ref 98–111)
Creatinine, Ser: 0.65 mg/dL (ref 0.61–1.24)
GFR, Estimated: >60 mL/min (ref >60)
Glucose, Bld: 211 mg/dL — ABNORMAL HIGH (ref 70–99)
Potassium: 3.9 mmol/L (ref 3.5–5.1)
Sodium: 137 mmol/L (ref 135–145)

## 2020-08-27 LAB — PROCALCITONIN: Procalcitonin: <0.1 ng/mL

## 2020-08-27 LAB — GLUCOSE, CAPILLARY
Glucose-Capillary: 197 mg/dL — ABNORMAL HIGH (ref 70–99)
Glucose-Capillary: 203 mg/dL — ABNORMAL HIGH (ref 70–99)

## 2020-08-27 LAB — C-REACTIVE PROTEIN: CRP: 3.3 mg/dL — ABNORMAL HIGH (ref <1.0)

## 2020-08-27 MED ORDER — CARVEDILOL 3.125 MG PO TABS: 6.2500 mg | ORAL_TABLET | Freq: Two times a day (BID) | ORAL | 0 refills | Status: DC

## 2020-08-27 MED ORDER — NITROFURANTOIN MONOHYD MACRO 100 MG PO CAPS: 100.0000 mg | ORAL_CAPSULE | Freq: Two times a day (BID) | ORAL | 0 refills | Status: AC

## 2020-08-27 MED ORDER — NITROFURANTOIN MONOHYD MACRO 100 MG PO CAPS
100.0000 mg | ORAL_CAPSULE | Freq: Two times a day (BID) | ORAL | Status: DC
  Filled 2020-08-27: qty 1

## 2020-08-27 MED ORDER — GUAIFENESIN-DM 100-10 MG/5ML PO SYRP
10.0000 mL | ORAL_SOLUTION | ORAL | 0 refills | Status: DC | PRN
Start: 2020-08-27 — End: 2021-01-11

## 2020-08-27 MED ORDER — LEVEMIR FLEXTOUCH 100 UNIT/ML ~~LOC~~ SOPN: 5.0000 [IU] | PEN_INJECTOR | Freq: Every day | SUBCUTANEOUS | 11 refills | Status: DC

## 2020-08-27 MED ORDER — DEXAMETHASONE 6 MG PO TABS: 6.0000 mg | ORAL_TABLET | Freq: Every day | ORAL | 0 refills | Status: AC

## 2020-08-27 NOTE — Plan of Care (Signed)
  Problem: Respiratory: Goal: Will maintain a patent airway Outcome: Progressing Goal: Complications related to the disease process, condition or treatment will be avoided or minimized Outcome: Progressing   Problem: Urinary Elimination: Goal: Signs and symptoms of infection will decrease Outcome: Progressing   Problem: Clinical Measurements: Goal: Respiratory complications will improve Outcome: Progressing   Problem: Nutrition: Goal: Adequate nutrition will be maintained Outcome: Progressing   Problem: Coping: Goal: Level of anxiety will decrease Outcome: Progressing   Problem: Pain Managment: Goal: General experience of comfort will improve Outcome: Progressing   Problem: Safety: Goal: Ability to remain free from injury will improve Outcome: Progressing   Problem: Skin Integrity: Goal: Risk for impaired skin integrity will decrease Outcome: Progressing

## 2020-08-27 NOTE — Progress Notes (Signed)
IV removed before discharge. Went over discharge instructions with patients wife over the phone. Patient will be going home via EMS.

## 2020-08-27 NOTE — TOC Transition Note (Signed)
Transition of Care [facility]) - CM/SW Discharge Note   Patient Details  Name: Azlaan Isidore. MRN: [id number] Date of Birth: Aug 16, 1945  Transition of Care [facility]) CM/SW Contact:  Allayne Butcher, RN Phone Number: 08/27/2020, 1:23 PM   Clinical Narrative:    Patient is medically cleared for discharge home today with home health services.  Patient is open with [facility].  [place] with [facility] aware of discharge today.  RNCM has arranged [facility] to transport patient home.    Final next level of care: Home w Home Health Services Barriers to Discharge: Barriers Resolved   Patient Goals and CMS Choice Patient states their goals for this hospitalization and ongoing recovery are:: Wife would like for patient to return home with her CMS Medicare.gov Compare Post Acute Care list provided to:: Patient Represenative (must comment) Choice offered to / list presented to : Spouse  Discharge Placement                Patient to be transferred to facility by: EMS will transport home      Discharge Plan and Services   Discharge Planning Services: CM Consult Post Acute Care Choice: Home Health          DME Arranged: N/A DME Agency: NA       HH Arranged: RN,PT HH Agency: [facility] Date HH Agency Contacted: 08/27/20 Time HH Agency Contacted: 1323 Representative spoke with at [facility] [place] [facility] Agency: Barbara Cower  Social Determinants of Health (SDOH) Interventions     Readmission Risk Interventions No flowsheet data found.

## 2020-08-27 NOTE — Discharge Summary (Signed)
Physician Discharge Summary  Roy [name]. OFB:[id number] DOB: [birth year] DOA: 08/24/2020  PCP: Rigoberto Noel, MD  Admit date: 08/24/2020 Discharge date: 08/27/2020  Admitted From: Home Discharge disposition: Home with home health PT, RN   Code Status: Full Code  Diet Recommendation: Cardiac/diabetic diet  Discharge Diagnosis:   Principal Problem:   Complicated UTI (urinary tract infection) Active Problems:   Spinal stenosis of lumbar region   S/P cervical spinal fusion   Hypertension   Chronic diastolic CHF (congestive heart failure) (HCC)   Cervical myelopathy (HCC)   Chronic indwelling Foley catheter   Wheelchair bound   COVID-19 virus infection   Acute respiratory failure with hypoxia (HCC)   NICM (nonischemic cardiomyopathy) (HCC)   CAD (coronary artery disease)   Type 2 diabetes mellitus with hyperlipidemia (HCC)  History of Present Illness / Brief narrative:  Roy Floydis a 75 y.o.malewith medical history significant forHTN, DM, nonobstructive CAD, nonischemic cardiomyopathy with normalized EF 60% 01/2020,OSA not on CPAP, lumbar spinal stenosis, bed-bound after a cervical spinal fusion February 2022 for cervical myelopathy who has an indwelling Foley catheter. Patient presented from home to the ED on 5/15 for evaluation of fever of 102.4, cough x1 day and a positive COVID test at home. Foley catheter was last changed by home health 08/05/20.  In the ED, urinalysis was positive and suggestive of infection.  COVID PCR test was positive.  Subjective:  Seen and examined this morning.  Pleasant elderly Caucasian male.  Propped up in bed.  Not in distress.  Was being fed by nursing staff.  Alert, awake, oriented x3.  Feels ready to go home.  Hospital Course:   COVID pneumonia Acute respiratory failure with hypoxia  -Presented with fever, cough -COVID test: PCR positive on admission -Chest imaging: Chest x-ray on admission showed perihilar  interstitial opacities and bronchial wall thickening suggestive of pneumonia -Treatment: Completed 5-day course of IV remdesivir.  Currently on IV dexamethasone.  Clinically improving.  Discharge on 5 more days of oral dexamethasone -Oxygen - SpO2: 100 % O2 Flow Rate (L/min): 2 L/min  -Supportive care: Vitamin C, Zinc, PRN inhalers, Tylenol, Antitussives (benzonatate/ Mucinex/Tussionex).   -Encouraged incentive spirometry, prone position, out of bed and early mobilization as much as possible -Continue airborne/contact isolation precautions for duration of 3 weeks from the day of diagnosis. -WBC and inflammatory markers trend as below.  Recent Labs  Lab 08/24/20 2051 08/25/20 0122 08/25/20 0826 08/26/20 0511 08/27/20 0424  SARSCOV2NAA POSITIVE*  --   --   --   --   WBC 10.6*  --   --   --  5.7  PROCALCITON  --   --  <0.10 <0.10 <0.10  DDIMER  --  0.92*  --   --   --   CRP  --  10.0*  --   --  3.3*  ALT 21  --   --   --   --    UTI associated with Chronic indwelling Foley catheter -Patient with a fever, foul-smelling urine and strongly positive UA -Follows up with urologist as an outpatient for a Foley change every month, last changed on 4/26 per wife. -5/17-urine culture with multiple organisms.  Improving on IV Rocephin.  To discharge home on 3 more days of oral Macrobid.  Essential hypertension -Home meds include amlodipine 10 mg daily, Coreg 6.25 mg twice daily, lisinopril 40 mg daily.   -Currently blood pressure is stable without any of these meds.  At discharge,  I would resume Coreg at the lowest of 3.125 mg twice daily.  I would keep other medicines on hold.  Continue to monitor blood pressure at home and resume if systolic blood pressures more than 140.    Chronic diastolic CHF (congestive heart failure)  NICM (nonischemic cardiomyopathy) (HCC) -Last EF 60% October 2021, was previously 35 to 40% in 2009 -Currently heart failure is stable.  Plan to resume Coreg as above.     Type 2 diabetes mellitus -A1c 6.1 on 5/16.   -Home meds include Levemir 12 units at bedtime, Premeal insulin, metformin 500 mg daily -Currently only on sliding scale insulin with Accu-Cheks.  Blood sugar level is running close to 200.  Post discharge, I would resume metformin and Levemir at 5 units at bedtime.  Dose to be adjusted based on blood sugar readings. Recent Labs  Lab 08/26/20 0744 08/26/20 1209 08/26/20 1634 08/26/20 2146 08/27/20 0905  GLUCAP 182* 203* 190* 182* 197*   CAD, nonobstructive -Continue Coreg, aspirin, statin.    Spinal stenosis of lumbar region S/P cervical spinal fusion Cervical myelopathy (HCC) Wheelchair/bedbound -Continue baclofen, oxycodone  Stable for discharge home today  Wound care: Incision (Closed) 01/29/20 Neck (Active)  Date First Assessed/Time First Assessed: 01/29/20 1640   Location: Neck    Assessments 01/29/2020  5:38 PM 02/14/2020 10:47 PM  Dressing Type Liquid skin adhesive --  Margins -- Attached edges (approximated)     No Linked orders to display     Incision (Closed) 05/06/20 Cervical Other (Comment) (Active)  Date First Assessed/Time First Assessed: 05/06/20 1718   Location: Cervical  Location Orientation: Other (Comment)    Assessments 05/06/2020  7:23 PM 05/07/2020  8:15 PM  Dressing Type Gauze (Comment);Transparent dressing --  Dressing Clean;Dry;Intact Clean;Dry;Intact  Site / Wound Assessment Dressing in place / Unable to assess Dressing in place / Unable to assess  Drainage Amount Minimal --  Drainage Description Sanguineous --     No Linked orders to display     Pressure Injury 08/25/20 Buttocks (Active)  Date First Assessed/Time First Assessed: 08/25/20 0030   Location: Buttocks    Assessments 08/25/2020  6:23 AM 08/26/2020  9:00 PM  Dressing Type Foam - Lift dressing to assess site every shift Foam - Lift dressing to assess site every shift  Site / Wound Assessment Clean;Dry;Red Clean;Dry  Wound Length (cm)  1 cm --  Wound Width (cm) 2 cm --  Wound Surface Area (cm^2) 2 cm^2 --     No Linked orders to display    Discharge Exam:   Vitals:   08/26/20 2015 08/26/20 2353 08/27/20 0413 08/27/20 0900  BP: 113/69 114/72 (!) 114/94 118/75  Pulse: (!) 55 60 63 62  Resp: 16 20 16 16   Temp: 98 F (36.7 C) 97.6 F (36.4 C) 98.4 F (36.9 C) 97.8 F (36.6 C)  TempSrc:   Oral   SpO2: 98% 99% 98% 100%  Weight:      Height:        Body mass index is 29.84 kg/m.  General exam: Pleasant elderly Caucasian male.  Propped up in bed.  Has chronic Foley catheter in place. Skin: No rashes, lesions or ulcers. HEENT: Atraumatic, normocephalic, no obvious bleeding Lungs: Clear to auscultation bilaterally CVS: Regular rate and rhythm, no murmur GI/Abd soft, nontender, nondistended, bowel sound present CNS: Alert, awake, oriented x3 Psychiatry: Mood appropriate Extremities: No pedal edema, no calf tenderness  Follow ups:   Discharge Instructions    Change dressing (specify)  Complete by: As directed    As before.   Diet - low sodium heart healthy   Complete by: As directed    Diet Carb Modified   Complete by: As directed    Increase activity slowly   Complete by: As directed       Follow-up Information    Rigoberto Noel, MD Follow up.   Specialty: Internal Medicine Contact information: [address] [place] [address] [phone]        Debbe Odea, MD .   Specialties: Cardiology, Radiology Contact information: [address] [place] [address] [phone]               Recommendations for Outpatient Follow-Up:   1. Follow-up with PCP as an outpatient 2. Follow-up with urology as an outpatient  Discharge Instructions:  Follow with Primary MD Rigoberto Noel, MD in 7 days   Get CBC/BMP checked in next visit within 1 week by PCP or SNF MD ( we routinely change or add medications that can affect your baseline labs and fluid status,  therefore we recommend that you get the mentioned basic workup next visit with your PCP, your PCP may decide not to get them or add new tests based on their clinical decision)  On your next visit with your PCP, please Get Medicines reviewed and adjusted.  Please request your PCP  to go over all Hospital Tests and Procedure/Radiological results at the follow up, please get all Hospital records sent to your Prim MD by signing hospital release before you go home.  Activity: As tolerated with Full fall precautions use walker/cane & assistance as needed  For Heart failure patients - Check your Weight same time everyday, if you gain over 2 pounds, or you develop in leg swelling, experience more shortness of breath or chest pain, call your Primary MD immediately. Follow Cardiac Low Salt Diet and 1.5 lit/day fluid restriction.  If you have smoked or chewed Tobacco in the last 2 yrs please stop smoking, stop any regular Alcohol  and or any Recreational drug use.  If you experience worsening of your admission symptoms, develop shortness of breath, life threatening emergency, suicidal or homicidal thoughts you must seek medical attention immediately by calling 911 or calling your MD immediately  if symptoms less severe.  You Must read complete instructions/literature along with all the possible adverse reactions/side effects for all the Medicines you take and that have been prescribed to you. Take any new Medicines after you have completely understood and accpet all the possible adverse reactions/side effects.   Do not drive, operate heavy machinery, perform activities at heights, swimming or participation in water activities or provide baby sitting services if your were admitted for syncope or siezures until you have seen by Primary MD or a Neurologist and advised to do so again.  Do not drive when taking Pain medications.  Do not take more than prescribed Pain, Sleep and Anxiety Medications  Wear Seat  belts while driving.   Please note You were cared for by a hospitalist during your hospital stay. If you have any questions about your discharge medications or the care you received while you were in the hospital after you are discharged, you can call the unit and asked to speak with the hospitalist on call if the hospitalist that took care of you is not available. Once you are discharged, your primary care physician will handle any further medical issues. Please note that NO REFILLS for any  discharge medications will be authorized once you are discharged, as it is imperative that you return to your primary care physician (or establish a relationship with a primary care physician if you do not have one) for your aftercare needs so that they can reassess your need for medications and monitor your lab values.    Allergies as of 08/27/2020   No Known Allergies     Medication List    STOP taking these medications   amLODipine 10 MG tablet Commonly known as: NORVASC   ciprofloxacin 500 MG tablet Commonly known as: CIPRO   lisinopril 10 MG tablet Commonly known as: ZESTRIL   lisinopril 40 MG tablet Commonly known as: ZESTRIL   meloxicam 7.5 MG tablet Commonly known as: MOBIC   oxyCODONE 5 MG immediate release tablet Commonly known as: Oxy IR/ROXICODONE   senna 8.6 MG Tabs tablet Commonly known as: SENOKOT     TAKE these medications   acetaminophen 500 MG tablet Commonly known as: TYLENOL Take 2 tablets (1,000 mg total) by mouth every 6 (six) hours.   aspirin 81 MG chewable tablet Chew 81 mg by mouth daily.   baclofen 10 MG tablet Commonly known as: LIORESAL Take 5 mg by mouth 3 (three) times daily.   carvedilol 3.125 MG tablet Commonly known as: COREG Take 2 tablets (6.25 mg total) by mouth 2 (two) times daily. What changed: medication strength   CENTRUM SILVER 50+MEN PO Take 1 tablet by mouth daily.   cyanocobalamin 2000 MCG tablet Take 2,000 mcg by mouth daily.    dexamethasone 6 MG tablet Commonly known as: Decadron Take 1 tablet (6 mg total) by mouth daily for 5 days.   famotidine 20 MG tablet Commonly known as: PEPCID Take 20 mg by mouth every evening.   gabapentin 300 MG capsule Commonly known as: NEURONTIN Take 300 mg by mouth at bedtime.   guaiFENesin-dextromethorphan 100-10 MG/5ML syrup Commonly known as: ROBITUSSIN DM Take 10 mLs by mouth every 4 (four) hours as needed for cough.   insulin aspart 100 UNIT/ML injection Commonly known as: novoLOG Inject 4-8 Units into the skin 3 (three) times daily with meals. Sliding scale   latanoprost 0.005 % ophthalmic solution Commonly known as: XALATAN Place 1 drop into the right eye at bedtime.   Levemir FlexTouch 100 UNIT/ML FlexPen Generic drug: insulin detemir Inject 5 Units into the skin at bedtime. What changed: how much to take   metFORMIN 500 MG tablet Commonly known as: GLUCOPHAGE Take by mouth.   nitrofurantoin (macrocrystal-monohydrate) 100 MG capsule Commonly known as: MACROBID Take 1 capsule (100 mg total) by mouth every 12 (twelve) hours for 3 days.   polyethylene glycol powder 17 GM/SCOOP powder Commonly known as: GLYCOLAX/MIRALAX Take 17 g by mouth daily as needed for moderate constipation.   simvastatin 20 MG tablet Commonly known as: ZOCOR Take 20 mg by mouth daily at 6 PM.   tamsulosin 0.4 MG Caps capsule Commonly known as: FLOMAX Take 1 capsule (0.4 mg total) by mouth daily after supper.            Discharge Care Instructions  (From admission, onward)         Start     Ordered   08/27/20 0000  Change dressing (specify)       Comments: As before.   08/27/20 1146          Time coordinating discharge: 35 minutes  The results of significant diagnostics from this hospitalization (including imaging, microbiology, ancillary and laboratory)  are listed below for reference.    Procedures and Diagnostic Studies:   DG Chest Portable 1  View  Result Date: 08/24/2020 CLINICAL DATA:  Cough x1 day with positive home COVID test EXAM: PORTABLE CHEST 1 VIEW COMPARISON:  January 14, 2020 FINDINGS: Mild cardiac enlargement likely accentuated by technique. Perihilar interstitial opacities and bronchial wall thickening. No visible pleural effusion or pneumothorax. No acute osseous abnormality. IMPRESSION: Perihilar interstitial opacities and bronchial wall thickening, which may reflect viral pneumonia. Electronically Signed   By: Maudry Mayhew MD   On: 08/24/2020 23:44     Labs:   Basic Metabolic Panel: Recent Labs  Lab 08/24/20 2051 08/27/20 0424  NA 134* 137  K 4.3 3.9  CL 100 104  CO2 26 26  GLUCOSE 147* 211*  BUN 38* 27*  CREATININE 0.77 0.65  CALCIUM 8.9 9.3   GFR Estimated Creatinine Clearance: 107.7 mL/min (by C-G formula based on SCr of 0.65 mg/dL). Liver Function Tests: Recent Labs  Lab 08/24/20 2051  AST 18  ALT 21  ALKPHOS 79  BILITOT 0.7  PROT 6.2*  ALBUMIN 3.3*   No results for input(s): LIPASE, AMYLASE in the last 168 hours. No results for input(s): AMMONIA in the last 168 hours. Coagulation profile No results for input(s): INR, PROTIME in the last 168 hours.  CBC: Recent Labs  Lab 08/24/20 2051 08/27/20 0424  WBC 10.6* 5.7  NEUTROABS 8.2*  --   HGB 11.3* 13.1  HCT 34.2* 38.6*  MCV 93.2 90.0  PLT 145* 192   Cardiac Enzymes: No results for input(s): CKTOTAL, CKMB, CKMBINDEX, TROPONINI in the last 168 hours. BNP: Invalid input(s): POCBNP CBG: Recent Labs  Lab 08/26/20 0744 08/26/20 1209 08/26/20 1634 08/26/20 2146 08/27/20 0905  GLUCAP 182* 203* 190* 182* 197*   D-Dimer Recent Labs    08/25/20 0122  DDIMER 0.92*   Hgb A1c Recent Labs    08/25/20 0122  HGBA1C 6.1*   Lipid Profile No results for input(s): CHOL, HDL, LDLCALC, TRIG, CHOLHDL, LDLDIRECT in the last 72 hours. Thyroid function studies No results for input(s): TSH, T4TOTAL, T3FREE, THYROIDAB in the last 72  hours.  Invalid input(s): FREET3 Anemia work up No results for input(s): VITAMINB12, FOLATE, FERRITIN, TIBC, IRON, RETICCTPCT in the last 72 hours. Microbiology Recent Results (from the past 240 hour(s))  Resp Panel by RT-PCR (Flu A&B, Covid) Nasopharyngeal Swab     Status: Abnormal   Collection Time: 08/24/20  8:51 PM   Specimen: Nasopharyngeal Swab; Nasopharyngeal(NP) swabs in vial transport medium  Result Value Ref Range Status   SARS Coronavirus 2 by RT PCR POSITIVE (A) NEGATIVE Final    Comment: RESULT CALLED TO, READ BACK BY AND VERIFIED WITH: TAYLOR LEWIS ON 08/24/20 AT 2301 QSD (NOTE) SARS-CoV-2 target nucleic acids are DETECTED.  The SARS-CoV-2 RNA is generally detectable in upper respiratory specimens during the acute phase of infection. Positive results are indicative of the presence of the identified virus, but do not rule out bacterial infection or co-infection with other pathogens not detected by the test. Clinical correlation with patient history and other diagnostic information is necessary to determine patient infection status. The expected result is Negative.  Fact Sheet for Patients: BloggerCourse.com  Fact Sheet for Healthcare Providers: SeriousBroker.it  This test is not yet approved or cleared by the [place] FDA and  has been authorized for detection and/or diagnosis of SARS-CoV-2 by FDA under an Emergency Use Authorization (EUA).  This EUA will remain in effect (meaning this  test can  be used) for the duration of  the COVID-19 declaration under Section 564(b)(1) of the Act, 21 U.S.C. section 360bbb-3(b)(1), unless the authorization is terminated or revoked sooner.     Influenza A by PCR NEGATIVE NEGATIVE Final   Influenza B by PCR NEGATIVE NEGATIVE Final    Comment: (NOTE) The Xpert Xpress SARS-CoV-2/FLU/RSV plus assay is intended as an aid in the diagnosis of influenza from Nasopharyngeal swab  specimens and should not be used as a sole basis for treatment. Nasal washings and aspirates are unacceptable for Xpert Xpress SARS-CoV-2/FLU/RSV testing.  Fact Sheet for Patients: BloggerCourse.comhttps://www.fda.gov/media/152166/download  Fact Sheet for Healthcare Providers: SeriousBroker.ithttps://www.fda.gov/media/152162/download  This test is not yet approved or cleared by the [place] FDA and has been authorized for detection and/or diagnosis of SARS-CoV-2 by FDA under an Emergency Use Authorization (EUA). This EUA will remain in effect (meaning this test can be used) for the duration of the COVID-19 declaration under Section 564(b)(1) of the Act, 21 U.S.C. section 360bbb-3(b)(1), unless the authorization is terminated or revoked.  Performed at [facility], [address] [place], [place] [address]   CULTURE, BLOOD (ROUTINE X 2) w Reflex to ID Panel     Status: None (Preliminary result)   Collection Time: 08/25/20  8:26 AM   Specimen: BLOOD  Result Value Ref Range Status   Specimen Description [facility]LOOD LEFT [facility]  Final   Special Requests   Final    BOTTLES DRAWN AEROBIC AND ANAEROBIC Blood Culture adequate volume   Culture   Final    NO GROWTH 1 DAY Performed at [facility], [address] [place], [place] [address]    Report Status PENDING  Incomplete  CULTURE, BLOOD (ROUTINE X 2) w Reflex to ID Panel     Status: None (Preliminary result)   Collection Time: 08/25/20  8:27 AM   Specimen: BLOOD  Result Value Ref Range Status   Specimen Description BLOOD LEFT HAND  Final   Special Requests   Final    BOTTLES DRAWN AEROBIC AND ANAEROBIC Blood Culture adequate volume   Culture   Final    NO GROWTH 1 DAY Performed at [facility], [address] [place], [place] [address]    Report Status PENDING  Incomplete  C Difficile Quick Screen w PCR reflex     Status: None   Collection Time: 08/25/20 10:01 AM   Specimen: STOOL  Result Value Ref Range Status   C Diff antigen  NEGATIVE NEGATIVE Final   C Diff toxin NEGATIVE NEGATIVE Final   C Diff interpretation No C. difficile detected.  Final    Comment: Performed at [facility], [address] [place], [place] [address]  Urine Culture     Status: Abnormal   Collection Time: 08/25/20 10:01 AM   Specimen: Urine, Catheterized  Result Value Ref Range Status   Specimen Description   Final    URINE, CATHETERIZED Performed at [facility], [address] [place], [place] [address]    Special Requests   Final    NONE Performed at [facility], [address] [place], [place] [address]    Culture MULTIPLE SPECIES PRESENT, SUGGEST RECOLLECTION (A)  Final   Report Status 08/26/2020 FINAL  Final     Signed: Melina SchoolsBinaya Dominic Mahaney  [facility] 08/27/2020, 11:53 AM

## 2020-08-30 LAB — CULTURE, BLOOD (ROUTINE X 2)
Culture: NO GROWTH
Culture: NO GROWTH
Special Requests: ADEQUATE
Special Requests: ADEQUATE

## 2020-09-05 ENCOUNTER — Ambulatory Visit: Payer: Medicare HMO | Admitting: Physician Assistant

## 2020-10-21 ENCOUNTER — Telehealth (INDEPENDENT_AMBULATORY_CARE_PROVIDER_SITE_OTHER): Payer: Medicare HMO | Admitting: Urology

## 2020-10-21 ENCOUNTER — Other Ambulatory Visit: Payer: Self-pay

## 2020-10-21 DIAGNOSIS — R339 Retention of urine, unspecified: Secondary | ICD-10-CM

## 2020-10-21 NOTE — Progress Notes (Signed)
Virtual Visit via Telephone Note  I connected with Myra Dias(POA) on 10/21/20 at  8:30 AM EDT by telephone and verified that I am speaking with the correct person using two identifiers.   Patient location: Home Provider location: [facility] Urologic Office   I discussed the limitations, risks, security and privacy concerns of performing an evaluation and management service by telephone and the availability of in person appointments. We discussed the impact of the COVID-19 pandemic on the healthcare system, and the importance of social distancing and reducing patient and provider exposure. I also discussed with the patient that there may be a patient responsible charge related to this service. The patient expressed understanding and agreed to proceed.  Reason for visit: Urinary retention  History of Present Illness: 75 year old complex male who was previously followed by Dr. Mena Goes.  He has been bedbound after a cervical spinal fusion and has had an indwelling Foley catheter since October 2021.  He failed a voiding trial around that time.  He remains severely debilitated and is working with physical therapy, and at this point can only sit up at the side of the bed with assistance.  His catheter is being changed monthly by home health.  He was admitted in May for COVID and a possible UTI, though unclear if this was just asymptomatic bacteriuria and fever was secondary to COVID.  No gross hematuria or problems with the catheter.  We discussed options at length including continuing monthly catheter changes until he gets stronger, scheduling a voiding trial, or pursuing urodynamics.  We discussed possible etiologies including urinary retention secondary to BPH versus neurogenic bladder, as well as typical time frame for managing neurogenic bladder patients after their acute injury.  With his ongoing severe debilitation, she would like to continue monthly catheter changes for the time being, and  repeat a virtual visit in 3 months since travel is very challenging for them to rediscuss voiding trial versus urodynamics.  Follow Up: Virtual visit 3 months   I discussed the assessment and treatment plan with the patient. The patient was provided an opportunity to ask questions and all were answered. The patient agreed with the plan and demonstrated an understanding of the instructions.   The patient was advised to call back or seek an in-person evaluation if the symptoms worsen or if the condition fails to improve as anticipated.  I provided 13 minutes of non-face-to-face time during this encounter.   Sondra Come, MD

## 2021-01-08 ENCOUNTER — Encounter: Payer: Self-pay | Admitting: Internal Medicine

## 2021-01-08 ENCOUNTER — Inpatient Hospital Stay: Payer: Medicare HMO

## 2021-01-08 ENCOUNTER — Inpatient Hospital Stay
Admission: EM | Admit: 2021-01-08 | Discharge: 2021-01-11 | DRG: 915 | Disposition: A | Discharge status: Home / Self Care | Payer: Medicare HMO | Attending: Internal Medicine | Admitting: Internal Medicine

## 2021-01-08 ENCOUNTER — Other Ambulatory Visit: Payer: Self-pay

## 2021-01-08 DIAGNOSIS — Z993 Dependence on wheelchair: Secondary | ICD-10-CM

## 2021-01-08 DIAGNOSIS — E785 Hyperlipidemia, unspecified: Secondary | ICD-10-CM | POA: Diagnosis present

## 2021-01-08 DIAGNOSIS — E119 Type 2 diabetes mellitus without complications: Secondary | ICD-10-CM | POA: Diagnosis present

## 2021-01-08 DIAGNOSIS — Z20822 Contact with and (suspected) exposure to covid-19: Secondary | ICD-10-CM | POA: Diagnosis present

## 2021-01-08 DIAGNOSIS — Z96651 Presence of right artificial knee joint: Secondary | ICD-10-CM | POA: Diagnosis present

## 2021-01-08 DIAGNOSIS — Z8616 Personal history of COVID-19: Secondary | ICD-10-CM

## 2021-01-08 DIAGNOSIS — Z7401 Bed confinement status: Secondary | ICD-10-CM

## 2021-01-08 DIAGNOSIS — M48061 Spinal stenosis, lumbar region without neurogenic claudication: Secondary | ICD-10-CM | POA: Diagnosis present

## 2021-01-08 DIAGNOSIS — H5462 Unqualified visual loss, left eye, normal vision right eye: Secondary | ICD-10-CM | POA: Diagnosis present

## 2021-01-08 DIAGNOSIS — H9190 Unspecified hearing loss, unspecified ear: Secondary | ICD-10-CM | POA: Diagnosis present

## 2021-01-08 DIAGNOSIS — Z888 Allergy status to other drugs, medicaments and biological substances status: Secondary | ICD-10-CM

## 2021-01-08 DIAGNOSIS — J9691 Respiratory failure, unspecified with hypoxia: Secondary | ICD-10-CM | POA: Diagnosis not present

## 2021-01-08 DIAGNOSIS — K219 Gastro-esophageal reflux disease without esophagitis: Secondary | ICD-10-CM | POA: Diagnosis present

## 2021-01-08 DIAGNOSIS — Z7984 Long term (current) use of oral hypoglycemic drugs: Secondary | ICD-10-CM | POA: Diagnosis not present

## 2021-01-08 DIAGNOSIS — R001 Bradycardia, unspecified: Secondary | ICD-10-CM | POA: Diagnosis present

## 2021-01-08 DIAGNOSIS — I428 Other cardiomyopathies: Secondary | ICD-10-CM | POA: Diagnosis present

## 2021-01-08 DIAGNOSIS — I10 Essential (primary) hypertension: Secondary | ICD-10-CM | POA: Diagnosis not present

## 2021-01-08 DIAGNOSIS — I2510 Atherosclerotic heart disease of native coronary artery without angina pectoris: Secondary | ICD-10-CM | POA: Diagnosis present

## 2021-01-08 DIAGNOSIS — I5032 Chronic diastolic (congestive) heart failure: Secondary | ICD-10-CM | POA: Diagnosis present

## 2021-01-08 DIAGNOSIS — Z981 Arthrodesis status: Secondary | ICD-10-CM | POA: Diagnosis not present

## 2021-01-08 DIAGNOSIS — Z66 Do not resuscitate: Secondary | ICD-10-CM | POA: Diagnosis present

## 2021-01-08 DIAGNOSIS — T783XXA Angioneurotic edema, initial encounter: Secondary | ICD-10-CM | POA: Diagnosis not present

## 2021-01-08 DIAGNOSIS — I110 Hypertensive heart disease with heart failure: Secondary | ICD-10-CM | POA: Diagnosis present

## 2021-01-08 DIAGNOSIS — M4802 Spinal stenosis, cervical region: Secondary | ICD-10-CM | POA: Diagnosis present

## 2021-01-08 DIAGNOSIS — J9690 Respiratory failure, unspecified, unspecified whether with hypoxia or hypercapnia: Secondary | ICD-10-CM | POA: Diagnosis present

## 2021-01-08 DIAGNOSIS — Z789 Other specified health status: Secondary | ICD-10-CM | POA: Diagnosis not present

## 2021-01-08 DIAGNOSIS — Z6830 Body mass index (BMI) 30.0-30.9, adult: Secondary | ICD-10-CM

## 2021-01-08 DIAGNOSIS — J9601 Acute respiratory failure with hypoxia: Secondary | ICD-10-CM | POA: Diagnosis present

## 2021-01-08 DIAGNOSIS — Z794 Long term (current) use of insulin: Secondary | ICD-10-CM

## 2021-01-08 DIAGNOSIS — E669 Obesity, unspecified: Secondary | ICD-10-CM | POA: Diagnosis present

## 2021-01-08 DIAGNOSIS — G4733 Obstructive sleep apnea (adult) (pediatric): Secondary | ICD-10-CM | POA: Diagnosis present

## 2021-01-08 DIAGNOSIS — Z79899 Other long term (current) drug therapy: Secondary | ICD-10-CM

## 2021-01-08 DIAGNOSIS — M4800 Spinal stenosis, site unspecified: Secondary | ICD-10-CM

## 2021-01-08 DIAGNOSIS — Z791 Long term (current) use of non-steroidal anti-inflammatories (NSAID): Secondary | ICD-10-CM

## 2021-01-08 LAB — CBC WITH DIFFERENTIAL/PLATELET
Abs Immature Granulocytes: 0.01 10*3/uL (ref 0.00–0.07)
Basophils Absolute: 0.1 10*3/uL (ref 0.0–0.1)
Basophils Relative: 1 %
Eosinophils Absolute: 0.3 10*3/uL (ref 0.0–0.5)
Eosinophils Relative: 5 %
HCT: 40.6 % (ref 39.0–52.0)
Hemoglobin: 13.7 g/dL (ref 13.0–17.0)
Immature Granulocytes: 0 %
Lymphocytes Relative: 38 %
Lymphs Abs: 2 10*3/uL (ref 0.7–4.0)
MCH: 31.7 pg (ref 26.0–34.0)
MCHC: 33.7 g/dL (ref 30.0–36.0)
MCV: 94 fL (ref 80.0–100.0)
Monocytes Absolute: 0.4 10*3/uL (ref 0.1–1.0)
Monocytes Relative: 7 %
Neutro Abs: 2.6 10*3/uL (ref 1.7–7.7)
Neutrophils Relative %: 49 %
Platelets: 210 10*3/uL (ref 150–400)
RBC: 4.32 MIL/uL (ref 4.22–5.81)
RDW: 12.4 % (ref 11.5–15.5)
WBC: 5.3 10*3/uL (ref 4.0–10.5)
nRBC: 0 % (ref 0.0–0.2)

## 2021-01-08 LAB — COMPREHENSIVE METABOLIC PANEL
ALT: 17 U/L (ref 0–44)
AST: 12 U/L — ABNORMAL LOW (ref 15–41)
Albumin: 3.5 g/dL (ref 3.5–5.0)
Alkaline Phosphatase: 91 U/L (ref 38–126)
Anion gap: 6 (ref 5–15)
BUN: 17 mg/dL (ref 8–23)
CO2: 34 mmol/L — ABNORMAL HIGH (ref 22–32)
Calcium: 9.9 mg/dL (ref 8.9–10.3)
Chloride: 100 mmol/L (ref 98–111)
Creatinine, Ser: 0.6 mg/dL — ABNORMAL LOW (ref 0.61–1.24)
GFR, Estimated: >60 mL/min (ref >60)
Glucose, Bld: 84 mg/dL (ref 70–99)
Potassium: 4 mmol/L (ref 3.5–5.1)
Sodium: 140 mmol/L (ref 135–145)
Total Bilirubin: 0.8 mg/dL (ref 0.3–1.2)
Total Protein: 6.8 g/dL (ref 6.5–8.1)

## 2021-01-08 LAB — RESP PANEL BY RT-PCR (FLU A&B, COVID) ARPGX2
Influenza A by PCR: NEGATIVE
Influenza B by PCR: NEGATIVE
SARS Coronavirus 2 by RT PCR: NEGATIVE

## 2021-01-08 LAB — MRSA NEXT GEN BY PCR, NASAL: MRSA by PCR Next Gen: NOT DETECTED

## 2021-01-08 LAB — CBG MONITORING, ED: Glucose-Capillary: 112 mg/dL — ABNORMAL HIGH (ref 70–99)

## 2021-01-08 IMAGING — DX DG CHEST 1V PORT
2 series · 2 of 2 positions shown · non-contrast
Comparison: [DATE]

CLINICAL DATA: Status post intubation.

EXAM:
PORTABLE CHEST 1 VIEW

[chest ap (1 of 2)]
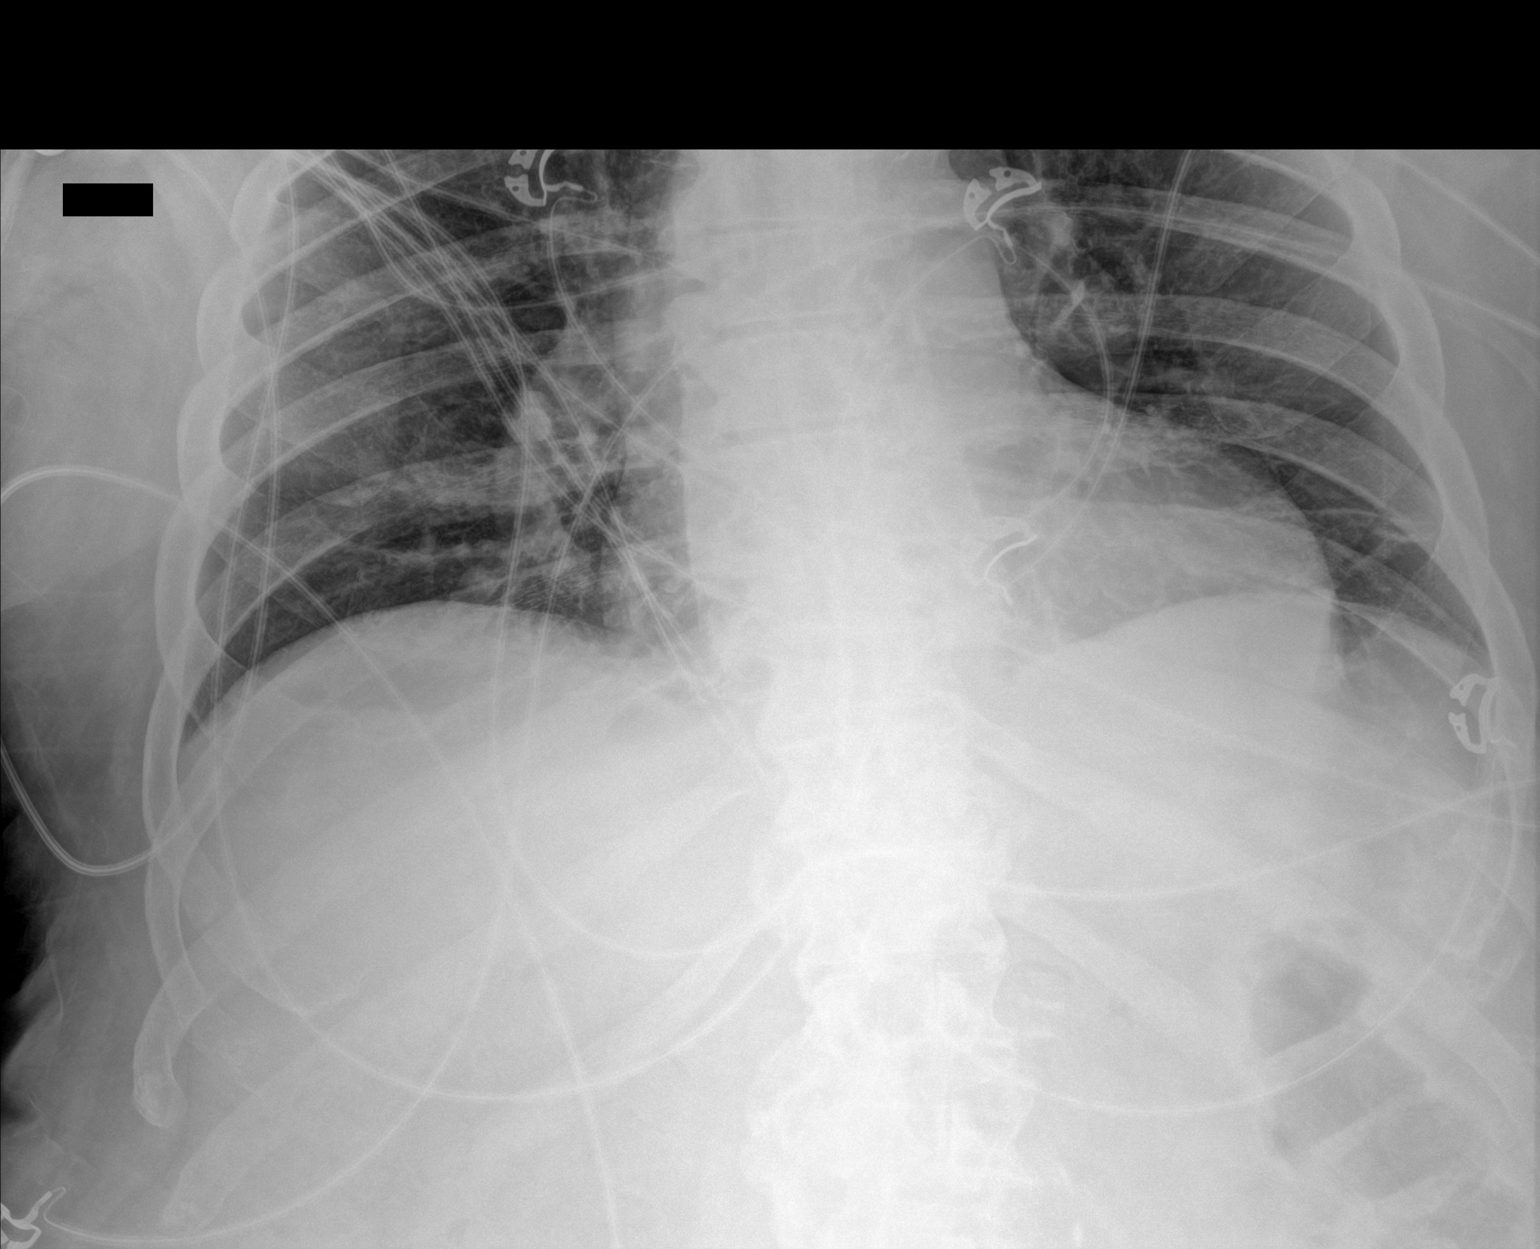

[chest ap (2 of 2)]
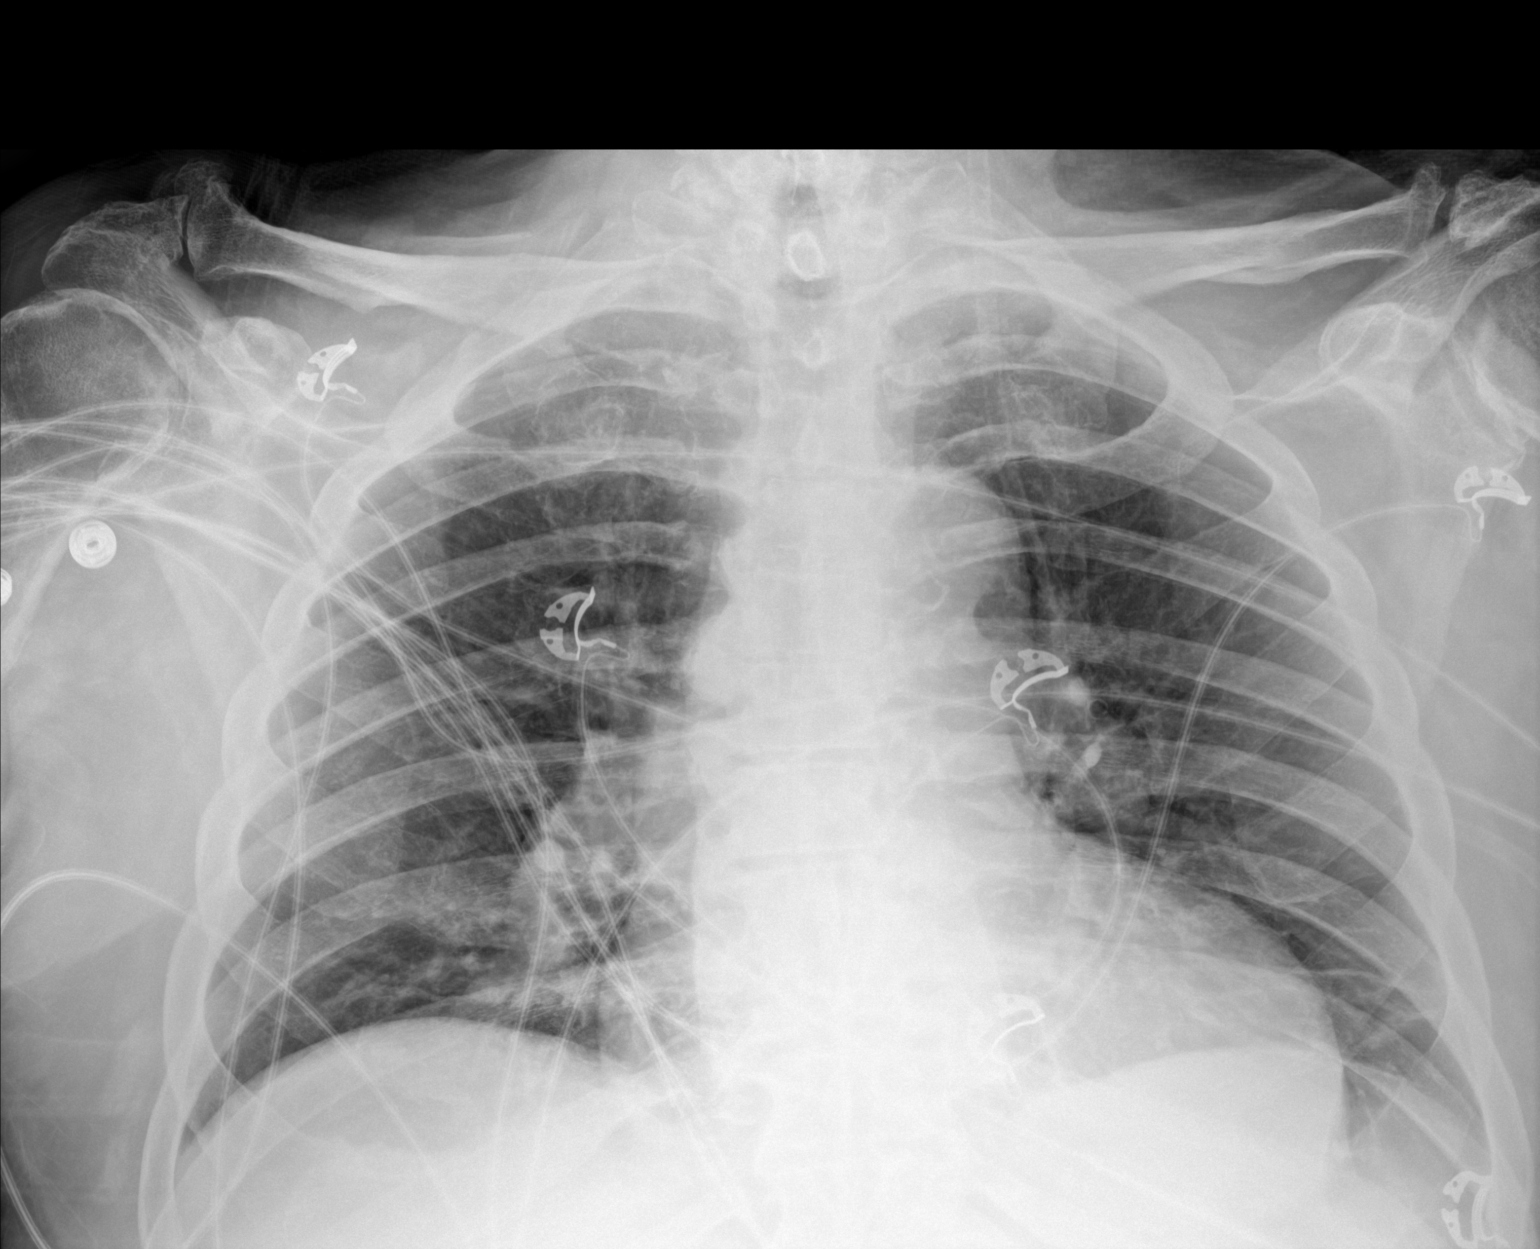

[2 of 2 positions shown; findings below may reference images not displayed]

FINDINGS: The ETT tip is now above the level of the clavicles and may need to
be advanced by approximately 4 cm. Heart size normal. No pleural
effusion or edema. No airspace consolidation identified. Marked
degenerative changes are noted involving both acromioclavicular
joints.
IMPRESSION: Tip of the ETT is above the level of the clavicles and may be
advanced by approximately 4 cm.

## 2021-01-08 IMAGING — DX DG CHEST 1V PORT
1 series · 1 of 1 positions shown · non-contrast
Comparison: Portable chest [DATE].

CLINICAL DATA: 75-year-old male with tongue swelling, possible
allergic reaction to lisinopril. Intubated.

EXAM:
PORTABLE CHEST 1 VIEW

[chest ap]
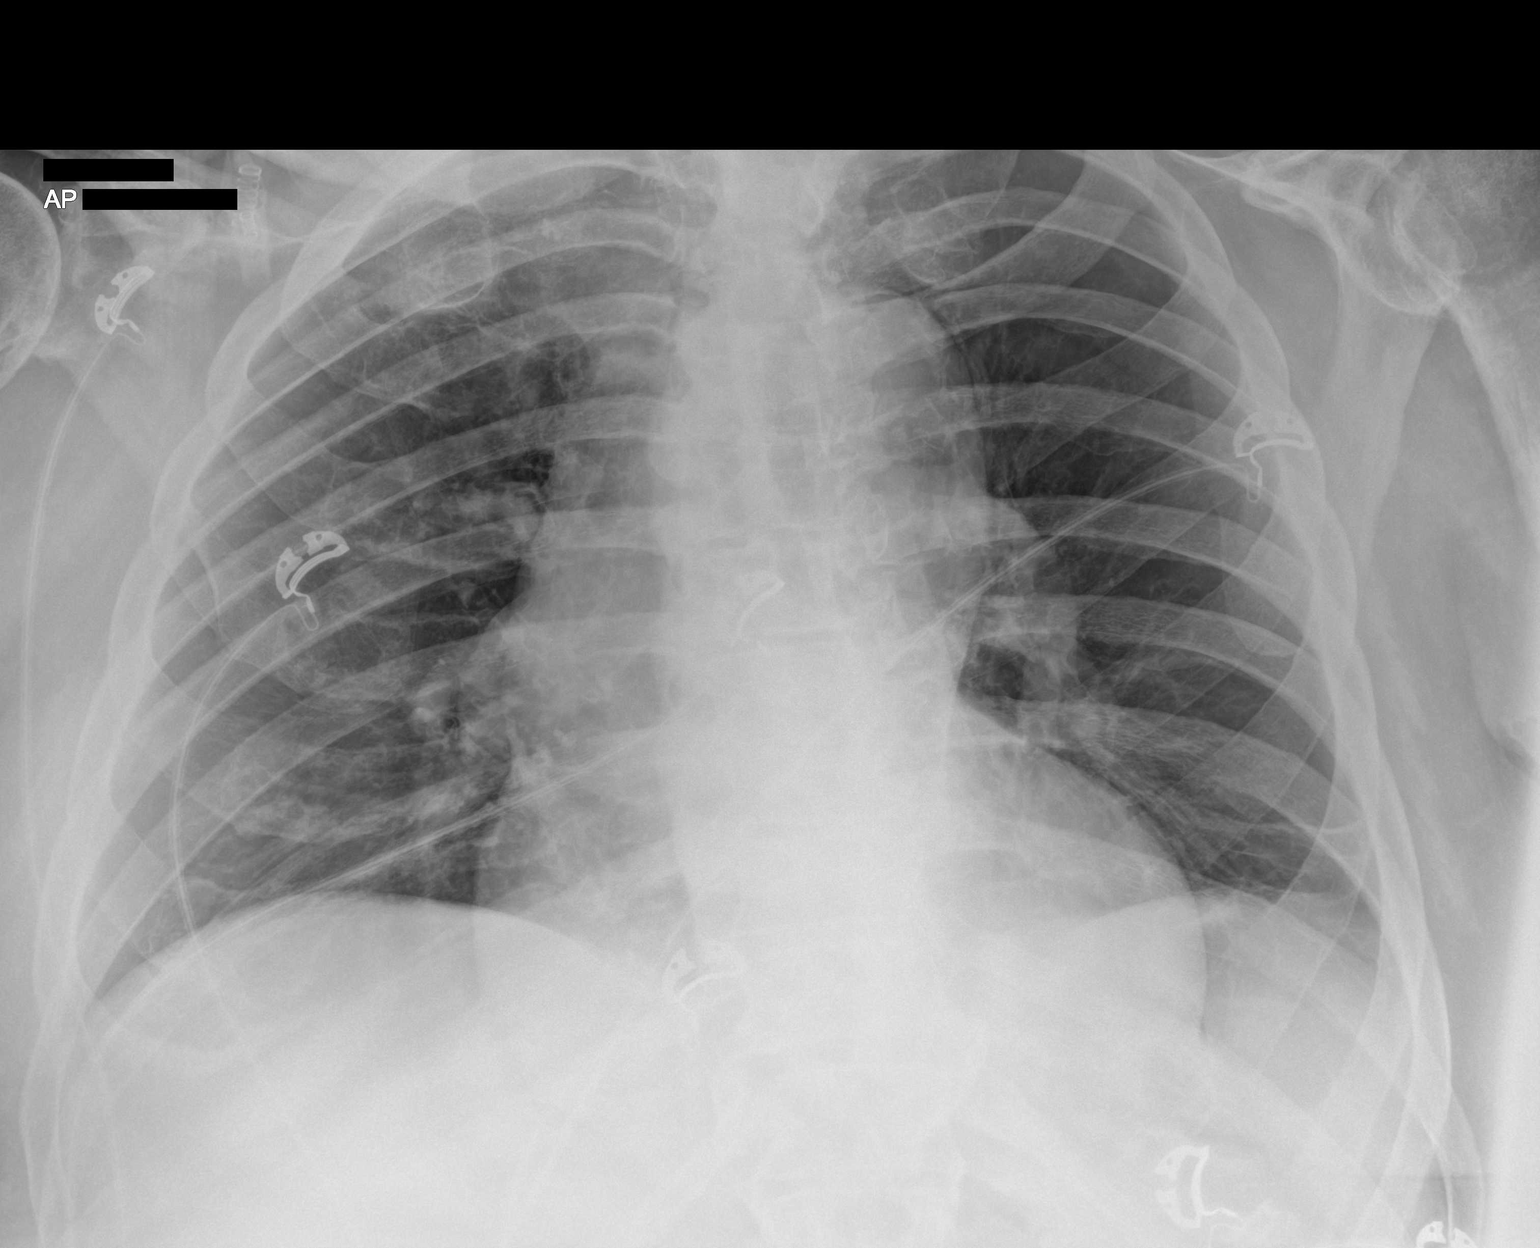

[1 of 1 positions shown; findings below may reference images not displayed]

FINDINGS: Portable AP semi upright view at [0R] hours. Mildly rotated to the
right. Endotracheal tube tip is at the level the clavicles. Tortuous
thoracic aorta. Borderline cardiomegaly. Other mediastinal contours
are within normal limits. Allowing for portable technique the lungs
are clear. No pneumothorax or pleural effusion. No acute osseous
abnormality identified.
IMPRESSION: 1. Intubated with endotracheal tube tip at the level the clavicles.
2. No acute cardiopulmonary abnormality.

## 2021-01-08 MED ORDER — FENTANYL CITRATE PF 50 MCG/ML IJ SOSY
50.0000 ug | PREFILLED_SYRINGE | Freq: Once | INTRAMUSCULAR | Status: AC
  Administered 2021-01-08: 50 ug via INTRAVENOUS
  Filled 2021-01-08: qty 1

## 2021-01-08 MED ORDER — FAMOTIDINE 20 MG IN NS 100 ML IVPB
20.0000 mg | Freq: Two times a day (BID) | INTRAVENOUS | Status: DC
  Administered 2021-01-08 – 2021-01-10 (×6): 20 mg via INTRAVENOUS
  Filled 2021-01-08 (×6): qty 100

## 2021-01-08 MED ORDER — FENTANYL CITRATE (PF) 100 MCG/2ML IJ SOLN: 25.0000 ug | INTRAMUSCULAR | Status: DC | PRN

## 2021-01-08 MED ORDER — MIDAZOLAM HCL 2 MG/2ML IJ SOLN
2.0000 mg | INTRAMUSCULAR | Status: DC | PRN
  Filled 2021-01-08: qty 2

## 2021-01-08 MED ORDER — ETOMIDATE 2 MG/ML IV SOLN
INTRAVENOUS | Status: AC
  Administered 2021-01-08: 30 mg via INTRAVENOUS
  Filled 2021-01-08: qty 20

## 2021-01-08 MED ORDER — FAMOTIDINE 20 MG IN NS 100 ML IVPB: 20.0000 mg | Freq: Two times a day (BID) | INTRAVENOUS | Status: DC

## 2021-01-08 MED ORDER — DOCUSATE SODIUM 50 MG/5ML PO LIQD
100.0000 mg | Freq: Two times a day (BID) | ORAL | Status: DC
  Filled 2021-01-08 (×6): qty 10

## 2021-01-08 MED ORDER — ACETAMINOPHEN 325 MG PO TABS: 650.0000 mg | ORAL_TABLET | ORAL | Status: DC | PRN

## 2021-01-08 MED ORDER — ROCURONIUM BROMIDE 50 MG/5ML IV SOLN
100.0000 mg | Freq: Once | INTRAVENOUS | Status: DC
  Filled 2021-01-08: qty 10

## 2021-01-08 MED ORDER — PROPOFOL 1000 MG/100ML IV EMUL: 5.0000 ug/kg/min | INTRAVENOUS | Status: DC

## 2021-01-08 MED ORDER — METHYLPREDNISOLONE SODIUM SUCC 40 MG IJ SOLR
40.0000 mg | Freq: Two times a day (BID) | INTRAMUSCULAR | Status: DC
  Administered 2021-01-08 – 2021-01-09 (×2): 40 mg via INTRAVENOUS
  Filled 2021-01-08 (×2): qty 1

## 2021-01-08 MED ORDER — POLYETHYLENE GLYCOL 3350 17 G PO PACK: 17.0000 g | PACK | Freq: Every day | ORAL | Status: DC | PRN

## 2021-01-08 MED ORDER — MIDAZOLAM HCL 2 MG/2ML IJ SOLN
2.0000 mg | INTRAMUSCULAR | Status: DC | PRN
  Administered 2021-01-08 – 2021-01-10 (×7): 2 mg via INTRAVENOUS
  Filled 2021-01-08 (×7): qty 2

## 2021-01-08 MED ORDER — LORAZEPAM 2 MG/ML IJ SOLN
2.0000 mg | Freq: Once | INTRAMUSCULAR | Status: AC
  Administered 2021-01-08: 1 mg via INTRAVENOUS
  Filled 2021-01-08: qty 1

## 2021-01-08 MED ORDER — ROCURONIUM BROMIDE 10 MG/ML (PF) SYRINGE
PREFILLED_SYRINGE | INTRAVENOUS | Status: AC
  Filled 2021-01-08: qty 10

## 2021-01-08 MED ORDER — POLYETHYLENE GLYCOL 3350 17 G PO PACK
17.0000 g | PACK | Freq: Every day | ORAL | Status: DC
  Filled 2021-01-08: qty 1

## 2021-01-08 MED ORDER — LIDOCAINE HCL URETHRAL/MUCOSAL 2 % EX GEL: 1.0000 "application " | Freq: Once | CUTANEOUS | Status: DC

## 2021-01-08 MED ORDER — LIDOCAINE HCL URETHRAL/MUCOSAL 2 % EX GEL
1.0000 "application " | Freq: Once | CUTANEOUS | Status: AC
  Administered 2021-01-08: 1 via INTRATRACHEAL

## 2021-01-08 MED ORDER — ETOMIDATE 2 MG/ML IV SOLN: 30.0000 mg | Freq: Once | INTRAVENOUS | Status: AC

## 2021-01-08 MED ORDER — FENTANYL CITRATE PF 50 MCG/ML IJ SOSY: 25.0000 ug | PREFILLED_SYRINGE | INTRAMUSCULAR | Status: DC | PRN

## 2021-01-08 MED ORDER — LIDOCAINE HCL (CARDIAC) PF 100 MG/5ML IV SOSY
PREFILLED_SYRINGE | INTRAVENOUS | Status: AC
  Filled 2021-01-08: qty 5

## 2021-01-08 MED ORDER — FENTANYL CITRATE (PF) 100 MCG/2ML IJ SOLN
INTRAMUSCULAR | Status: AC
  Filled 2021-01-08: qty 2

## 2021-01-08 MED ORDER — SODIUM CHLORIDE 0.9% FLUSH
3.0000 mL | Freq: Two times a day (BID) | INTRAVENOUS | Status: DC
  Administered 2021-01-08 – 2021-01-10 (×4): 3 mL via INTRAVENOUS

## 2021-01-08 MED ORDER — DIPHENHYDRAMINE HCL 50 MG/ML IJ SOLN
25.0000 mg | Freq: Three times a day (TID) | INTRAMUSCULAR | Status: DC
  Administered 2021-01-08 – 2021-01-11 (×9): 25 mg via INTRAVENOUS
  Filled 2021-01-08 (×9): qty 1

## 2021-01-08 MED ORDER — ONDANSETRON HCL 4 MG/2ML IJ SOLN: 4.0000 mg | Freq: Four times a day (QID) | INTRAMUSCULAR | Status: DC | PRN

## 2021-01-08 MED ORDER — PROPOFOL 1000 MG/100ML IV EMUL
0.0000 ug/kg/min | INTRAVENOUS | Status: DC
  Administered 2021-01-08: 30 ug/kg/min via INTRAVENOUS
  Administered 2021-01-08 – 2021-01-09 (×6): 35 ug/kg/min via INTRAVENOUS
  Administered 2021-01-09 (×2): 30 ug/kg/min via INTRAVENOUS
  Administered 2021-01-10 (×2): 35 ug/kg/min via INTRAVENOUS
  Filled 2021-01-08 (×10): qty 100

## 2021-01-08 MED ORDER — HEPARIN SODIUM (PORCINE) 5000 UNIT/ML IJ SOLN
5000.0000 [IU] | Freq: Three times a day (TID) | INTRAMUSCULAR | Status: DC
  Administered 2021-01-08 – 2021-01-11 (×9): 5000 [IU] via SUBCUTANEOUS
  Filled 2021-01-08 (×9): qty 1

## 2021-01-08 MED ORDER — SODIUM CHLORIDE 0.9% FLUSH: 3.0000 mL | INTRAVENOUS | Status: DC | PRN

## 2021-01-08 MED ORDER — DOCUSATE SODIUM 100 MG PO CAPS: 100.0000 mg | ORAL_CAPSULE | Freq: Two times a day (BID) | ORAL | Status: DC | PRN

## 2021-01-08 MED ORDER — SODIUM CHLORIDE 0.9 % IV SOLN: 250.0000 mL | INTRAVENOUS | Status: DC | PRN

## 2021-01-08 MED ORDER — PROPOFOL 1000 MG/100ML IV EMUL
INTRAVENOUS | Status: AC
  Administered 2021-01-08: 30 ug/kg/min via INTRAVENOUS
  Filled 2021-01-08: qty 100

## 2021-01-08 NOTE — Plan of Care (Signed)
  Problem: Education: Goal: Knowledge of General Education information will improve Description: Including pain rating scale, medication(s)/side effects and non-pharmacologic comfort measures Outcome: Not Progressing   Problem: Health Behavior/Discharge Planning: Goal: Ability to manage health-related needs will improve Outcome: Not Progressing   Problem: Clinical Measurements: Goal: Ability to maintain clinical measurements within normal limits will improve Outcome: Not Progressing Goal: Will remain free from infection Outcome: Not Progressing Goal: Diagnostic test results will improve Outcome: Not Progressing Goal: Respiratory complications will improve Outcome: Not Progressing Goal: Cardiovascular complication will be avoided Outcome: Not Progressing   Problem: Activity: Goal: Risk for activity intolerance will decrease Outcome: Not Progressing   Problem: Nutrition: Goal: Adequate nutrition will be maintained Outcome: Not Progressing   Problem: Coping: Goal: Level of anxiety will decrease Outcome: Not Progressing   Problem: Elimination: Goal: Will not experience complications related to bowel motility Outcome: Not Progressing Goal: Will not experience complications related to urinary retention Outcome: Not Progressing   Problem: Pain Managment: Goal: General experience of comfort will improve Outcome: Not Progressing   Problem: Safety: Goal: Ability to remain free from injury will improve Outcome: Not Progressing   Problem: Skin Integrity: Goal: Risk for impaired skin integrity will decrease Outcome: Not Progressing  Patient total care at this time intubated via nasal due to angioedema wife at bedside she is patient caregiver at home.

## 2021-01-08 NOTE — ED Provider Notes (Signed)
[facility] Emergency Department Provider Note  ____________________________________________  Time seen: Approximately 10:33 AM  I have reviewed the triage vital signs and the nursing notes.   HISTORY  Chief Complaint Allergic Reaction    Level 5 Caveat: Portions of the History and Physical including HPI and review of systems are unable to be completely obtained due to acute distress and severe dysphonia  HPI Roy [name]. is a 75 y.o. male with a history of CHF CAD GERD diabetes hypertension and obesity who comes to the ED due to tongue swelling.  The patient takes lisinopril, last dose was yesterday night.  He woke up at 2 AM noticing some swelling of his tongue.  It continued to worsen and he woke his wife up at 8:00 AM.  She is a retired Scientist, clinical (histocompatibility and immunogenetics).  She was worried and called EMS.  Patient speech is very difficult to understand.     Past Medical History:  Diagnosis Date  . Bedbound   . Bilateral shoulder pain   . CHF (congestive heart failure) (HCC)   . Coronary artery disease   . Diabetes mellitus without complication (HCC)   . GERD (gastroesophageal reflux disease)   . Glaucoma   . HLD (hyperlipidemia)   . Hypertension   . Lumbar stenosis   . Non-ischemic cardiomyopathy (HCC)   . PVC (premature ventricular contraction)   . Sleep apnea    H/O NO CPAP IN 18 YRS     Patient Active Problem List   Diagnosis Date Noted  . Chronic indwelling Foley catheter 08/24/2020  . Wheelchair bound 08/24/2020  . Complicated UTI (urinary tract infection) 08/24/2020  . COVID-19 virus infection 08/24/2020  . Acute respiratory failure with hypoxia (HCC) 08/24/2020  . NICM (nonischemic cardiomyopathy) (HCC) 08/24/2020  . CAD (coronary artery disease) 08/24/2020  . Type 2 diabetes mellitus with hyperlipidemia (HCC) 08/24/2020  . Cervical myelopathy (HCC) 05/05/2020  . Post-operative state 01/31/2020  . Hypertension   . Diabetes mellitus without  complication (HCC)   . HLD (hyperlipidemia)   . Chronic diastolic CHF (congestive heart failure) (HCC)   . S/P cervical spinal fusion 01/29/2020  . Weakness   . Spinal stenosis of lumbar region   . History of progressive weakness   . Claudication (HCC)   . Lower extremity weakness 01/14/2020     Past Surgical History:  Procedure Laterality Date  . ANTERIOR CERVICAL DECOMP/DISCECTOMY FUSION N/A 01/29/2020   Procedure: ANTERIOR CERVICAL DECOMPRESSION/DISCECTOMY FUSION 1 LEVEL C3/4;  Surgeon: [name], MD;  Location: [facility] ORS;  Service: Neurosurgery;  Laterality: N/A;  . arm surgery Right    4x surgery as a child, cut arm falling through glass window   . HYDROCELE EXCISION / REPAIR    . POSTERIOR CERVICAL FUSION/FORAMINOTOMY N/A 05/06/2020   Procedure: C3-4 LAMINECTOMY & C3-4 FUSION;  Surgeon: [name], MD;  Location: [facility] ORS;  Service: Neurosurgery;  Laterality: N/A;  . REPLACEMENT TOTAL KNEE Right      Prior to Admission medications   Medication Sig Start Date End Date Taking? Authorizing Provider  acetaminophen (TYLENOL) 500 MG tablet Take 2 tablets (1,000 mg total) by mouth every 6 (six) hours. 05/08/20   Venetia Night, MD  aspirin 81 MG chewable tablet Chew 81 mg by mouth daily.    [provider]  baclofen (LIORESAL) 10 MG tablet Take 5 mg by mouth 3 (three) times daily.    [provider]  carvedilol (COREG) 3.125 MG tablet Take 2 tablets (6.25 mg total)  by mouth 2 (two) times daily. 08/27/20 09/26/20  Lorin Glass, MD  cyanocobalamin 2000 MCG tablet Take 2,000 mcg by mouth daily. 01/24/19   [provider]  famotidine (PEPCID) 20 MG tablet Take 20 mg by mouth every evening.    [provider]  gabapentin (NEURONTIN) 300 MG capsule Take 300 mg by mouth at bedtime.    [provider]  guaiFENesin-dextromethorphan (ROBITUSSIN DM) 100-10 MG/5ML syrup Take 10 mLs by mouth every 4 (four) hours as needed for cough. 08/27/20    Dahal, Melina Schools, MD  insulin aspart (NOVOLOG) 100 UNIT/ML injection Inject 4-8 Units into the skin 3 (three) times daily with meals. Sliding scale    [provider]  insulin detemir (LEVEMIR FLEXTOUCH) 100 UNIT/ML FlexPen Inject 5 Units into the skin at bedtime. 08/27/20   Dahal, Melina Schools, MD  latanoprost (XALATAN) 0.005 % ophthalmic solution Place 1 drop into the right eye at bedtime. 08/20/19   [provider]  metFORMIN (GLUCOPHAGE) 500 MG tablet Take by mouth.    [provider]  Multiple Vitamins-Minerals (CENTRUM SILVER 50+MEN PO) Take 1 tablet by mouth daily.    [provider]  polyethylene glycol powder (GLYCOLAX/MIRALAX) 17 GM/SCOOP powder Take 17 g by mouth daily as needed for moderate constipation.    [provider]  simvastatin (ZOCOR) 20 MG tablet Take 20 mg by mouth daily at 6 PM.    [provider]  tamsulosin (FLOMAX) 0.4 MG CAPS capsule Take 1 capsule (0.4 mg total) by mouth daily after supper. 01/25/20   Wouk, Wilfred Curtis, MD     Allergies Patient has no known allergies.   No family history on file.  Social History Social History   Tobacco Use  . Smoking status: Never  . Smokeless tobacco: Never  Vaping Use  . Vaping Use: Never used  Substance Use Topics  . Alcohol use: No  . Drug use: Never    Review of Systems Level 5 Caveat: Portions of the History and Physical including HPI and review of systems are unable to be completely obtained due to patient being a poor historian   Constitutional:   No known fever.  ENT:   No rhinorrhea.  Positive tongue swelling since this morning Cardiovascular:   No chest pain or syncope. Respiratory:   No dyspnea or cough. Gastrointestinal:   Negative for abdominal pain, vomiting and diarrhea.  Musculoskeletal:   Negative for focal pain or swelling ____________________________________________   PHYSICAL EXAM:  VITAL SIGNS: ED Triage Vitals  Enc Vitals Group     BP  01/08/21 0951 (!) 130/94     Pulse Rate 01/08/21 0951 64     Resp 01/08/21 0951 19     Temp 01/08/21 0952 (!) 96.2 F (35.7 C)     Temp Source 01/08/21 0952 Axillary     SpO2 01/08/21 0951 100 %     Weight 01/08/21 0941 242 lb 9.6 oz (110 kg)     Height 01/08/21 0941 6\' 1"  (1.854 m)     Head Circumference --      Peak Flow --      Pain Score 01/08/21 0941 0     Pain Loc --      Pain Edu? --      Excl. in GC? --     Vital signs reviewed, nursing assessments reviewed.   Constitutional:   Alert and oriented.  Mild respiratory distress. Eyes:   Conjunctivae are normal. EOMI. PERRL. ENT  Head:   Normocephalic and atraumatic.      Nose:   No congestion/rhinnorhea.       Mouth/Throat:   Moist mucosa.  Tongue is extremely edematous and protruding from the mouth.  The underside of the tongue is being abraded by the teeth.  Floor of mouth is edematous and elevated.  Patient's submandibular space feels full.      Neck:   No meningismus. Full ROM. Hematological/Lymphatic/Immunilogical:   No cervical lymphadenopathy. Cardiovascular:   RRR. Symmetric bilateral radial and DP pulses.  No murmurs. Cap refill less than 2 seconds. Respiratory:   Normal respiratory effort without tachypnea/retractions. Breath sounds are clear and equal bilaterally. No wheezes/rales/rhonchi. Gastrointestinal:   Soft and nontender. Non distended. There is no CVA tenderness.  No rebound, rigidity, or guarding. Genitourinary:   deferred Musculoskeletal:   Normal range of motion in all extremities. No joint effusions.  No lower extremity tenderness.  No edema. Neurologic:   Dysphonic speech.  Fluent language Motor grossly intact. No acute focal neurologic deficits are appreciated.  Skin:    Skin is warm, dry and intact. No rash noted.  No petechiae, purpura, or bullae.  ____________________________________________    LABS (pertinent positives/negatives) (all labs ordered are listed, but only abnormal results  are displayed) Labs Reviewed  RESP PANEL BY RT-PCR (FLU A&B, COVID) [id number]  COMPREHENSIVE METABOLIC PANEL  CBC WITH DIFFERENTIAL/PLATELET   ____________________________________________   EKG    ____________________________________________    RADIOLOGY  No results found.  ____________________________________________   PROCEDURES .Critical Care Performed by: Sharman Cheek, MD Authorized by: Sharman Cheek, MD   Critical care provider statement:    Critical care time (minutes):  40   Critical care time was exclusive of:  Separately billable procedures and treating other patients   Critical care was necessary to treat or prevent imminent or life-threatening deterioration of the following conditions:  Respiratory failure   Critical care was time spent personally by me on the following activities:  Development of treatment plan with patient or surrogate, discussions with consultants, evaluation of patient's response to treatment, examination of patient, obtaining history from patient or surrogate, ordering and performing treatments and interventions, ordering and review of laboratory studies, ordering and review of radiographic studies, pulse oximetry, re-evaluation of patient's condition and review of old charts  ____________________________________________    CLINICAL IMPRESSION / ASSESSMENT AND PLAN / ED COURSE  Medications ordered in the ED: Medications  rocuronium (ZEMURON) injection 100 mg ( Intravenous Not Given 01/08/21 1016)  propofol (DIPRIVAN) 1000 MG/100ML infusion (30 mcg/kg/min  110 kg Intravenous New Bag/Given 01/08/21 1020)  etomidate (AMIDATE) injection 30 mg (30 mg Intravenous Given 01/08/21 1016)  LORazepam (ATIVAN) injection 2 mg (1 mg Intravenous Given 01/08/21 1004)  lidocaine (XYLOCAINE) 2 % jelly 1 application (1 application Tracheal Tube Given by Other 01/08/21 1006)  fentaNYL (SUBLIMAZE) injection 50 mcg (50 mcg Intravenous Given 01/08/21 1026)     Pertinent labs & imaging results that were available during my care of the patient were reviewed by me and considered in my medical decision making (see chart for details).   Roy [name]. was evaluated in Emergency Department on 01/08/2021 for the symptoms described in the history of present illness. He was evaluated in the context of the global COVID-19 pandemic, which necessitated consideration that the patient might be at risk for infection with the SARS-CoV-2 virus that causes COVID-19. Institutional protocols and algorithms that pertain to the evaluation of patients at risk for COVID-19 are in  a state of rapid change based on information released by regulatory bodies including the CDC and federal and state organizations. These policies and algorithms were followed during the patient's care in the ED.     Clinical Course as of 01/08/21 1040  Thu Jan 08, 2021  [id number] Patient arrived with severe angioedema of the tongue and floor of mouth.  Requires immediate intubation for airway protection. ENT consulted for eval / awake intubation assistance.  Patient given 1 mg IV Ativan for anxiolysis in preparation for laryngoscopy and intubation.  Patient's spouse at bedside verbally consents to laryngoscopy and nasotracheal intubation on his behalf [PS]  1024 Pt intubated nasotracheally by Dr. Andee Poles.  Once airway was secured, pt was sedated with etomidate, and then started on propofol infusion.  [PS]    Clinical Course User Index [PS] Sharman Cheek, MD       ____________________________________________   FINAL CLINICAL IMPRESSION(S) / ED DIAGNOSES    Final diagnoses:  Angioedema, initial encounter     ED Discharge Orders     None       Portions of this note were generated with dragon dictation software. Dictation errors may occur despite best attempts at proofreading.   Sharman Cheek, MD 01/08/21 1037

## 2021-01-08 NOTE — H&P (Signed)
NAME:  Roy Janowiak., MRN:  [id number], DOB:  [birth year], LOS: 0 ADMISSION DATE:  01/08/2021 CONSULTATION DATE:  01/08/2021 REFERRING MD:  Carrie Mew CHIEF COMPLAINT:  Allergic reaction  75 yo male presented to ER on 01/08/21 with allergic reaction, extremely edematous tongue and oral airway suspected Lisinopril angioedema, s/p nasotracheal intubation by ENT and admitted to the ICU 01/08/21 for vent mgmt and hemodynamic support on sedation.   History of Present Illness:  Roy [name], Roy [name] is a 75 year old male with past medical history including CHF, CAD, HTN, HLD, Non-ischemic CM, IDDM, OSA without CPAP, BPH with chronic foley catheter, and chronic debility bedbound status following ACDF C3-C4 in Oct 2021 and PCDF C3-C4 in Jan 2022. Patient is dependent on his wife, son, home health, and PT for assist with his ADLs and strength, mobility training and uses a Hoyer lift for bed to chair transitions. He has a foley catheter exchange monthly and follows urology outpatient for this care. Of note, the patient has been taking Lisinopril for "many years" per his wife without history of allergic reaction. Last dose was prior to bedtime last night.   Patient was brought to the ER via EMS today 01/08/21 due to tongue swelling and oral bleeding and was ultimately intubated by ENT with a nasotracheal tube for airway protection given diffuse angioedema of tongue and epiglottis. History of events provided by patient's wife, Roy [name], at the bedside as well as patient record as patient is intubated and sedated at this time. Patient has been in his usual state of health until early this morning. He woke up around 2 am and ate a couple of mints before falling back asleep, which is his nightly routine. He woke up again at 6 am and reportedly noticed some tongue swelling; however, he was not having trouble breathing so he did not wake his wife. At 8 am, his tongue swelling was getting worse, so the  patient woke his wife for assistance. His tongue was protruding from his mouth with diffuse swelling and some bleeding from under the tongue. The patient reportedly never had trouble breathing during this at home; however, his wife is a retired Immunologist and decided that she could not manage the bleeding or possible respiratory distress if his swelling continued to worsen. Of note, his wife gave the patient one Benadryl around 8 am this morning prior to calling EMS.   On arrival to the ER, patient noted with tongue extremely edematous and protruding from mouth with abrasions under the tongue from teeth scraping, fullness of oral floor and submandibular space, and difficulty speaking. BP 130/94, HR 64, O2 saturations 100% in mild respiratory distress. ENT was consulted for difficult airway in setting of emergent intubation for airway protection, which patient and his wife consented to. He was given 41m Ativan prior to intubation for anxiolysis and was intubated nasotracheal by ENT Dr VPryor Ochoawith sedation initiated post intubation. ER workup includes: Labs CBC unremarkable, CMP bicarb 34 otherwise unremarkable, COVID PCR and Influenza A/B screen negative, and CBG 112. CXR was obtained post intubation which showed NT tube at level of clavicles an no acute cardiopulmonary process.   On exam, patient is bradycardic HR 45-55 which patient's wife reports is normal for him. His tongue remains extremely edematous with small amount of clear secretions from mouth noted. Per his wife, patient's last foley catheter exchange was earlier this week. He is also extremely hard of hearing, blind in the left eye, and with  significant generalized weakness worse on the left side post cervical fusion earlier this year. Patient's wife reports that patient is DNR which he has discussed in detail with his wife and sons. Patient is pending admission to ICU once bed assigned.   Pertinent  Medical History   Past Medical History:  Diagnosis  Date   Bedbound    Bilateral shoulder pain    CHF (congestive heart failure) (HCC)    Coronary artery disease    Diabetes mellitus without complication (HCC)    GERD (gastroesophageal reflux disease)    Glaucoma    HLD (hyperlipidemia)    Hypertension    Lumbar stenosis    Non-ischemic cardiomyopathy ([place])    PVC (premature ventricular contraction)    Sleep apnea    H/O NO CPAP IN [place] [facility] Events: Including procedures, antibiotic start and stop dates in addition to other pertinent events   9/29 Presented to ER via EMS with extreme tongue edema, oral secretions and bleeding. Suspected Lisinopril reaction 9/29 Intubated by ENT with nasotracheal tube for airway protection after noted with tongue extremely edematous and protruding from mouth with abrasions under the tongue from teeth scraping, fullness of oral floor and submandibular space, and difficulty speaking, mild respiratory distress. 9/29 Admitted to ICU for vent mgmt, hemodynamic monitoring on sedation  Micro Data:  9/29: Respiratory viral COVID PCR + Influenza A/B: Negative  Antimicrobials:  None  Interim History / Subjective:  Patient is seen in the ER pending admission to ICU, intubated with nasotracheal tube and sedated on propofol. He is unable to provide interim history, subjective, or RoS at this time. History is obtained from his wife, Roy [name], at bedside.  On exam, patient is bradycardic HR 45-55 which patient's wife reports is normal for him. His tongue remains extremely edematous with small amount of clear secretions from mouth noted. He is otherwise resting comfortably and moving all 4 extremities in his sleep, right > left.   Objective   Blood pressure 115/66, pulse (!) 50, temperature (!) 96.2 F (35.7 C), temperature source Axillary, resp. rate 14, height _0  (1.854 m), weight 110 kg, SpO2 94 %.    Vent Mode: AC FiO2 (%):  [40 %] 40 % Set Rate:  [16 bmp] 16 bmp Vt Set:  [450 mL] 450  mL PEEP:  [5 cmH20] 5 cmH20  No intake or output data in the 24 hours ending 01/08/21 1145 Filed Weights   01/08/21 0941  Weight: 110 kg    REVIEW OF SYSTEMS Patient is unable to provide review of systems at this time due to intubation/sedation.  PHYSICAL EXAMINATION:  GENERAL:critically ill appearing, +resp distress  +nasotracheal tube EYES: Pupils equal, round, reactive to light.  No scleral icterus.  MOUTH: Moist mucosal membrane. INTUBATED + tongue extremely edematous with small amount clear oral secretions NECK: Supple. + submandibular fullness PULMONARY: +rhonchi, +wheezing CARDIOVASCULAR: S1 and S2.  No murmurs  GASTROINTESTINAL: Soft, nontender, non-distended. Positive bowel sounds, sluggish MUSCULOSKELETAL: No swelling, clubbing, or edema.  NEUROLOGIC: sedated. Moving all 4 extremities independently, with weakness left > right (per wife, chronic at baseline) SKIN: Intact,warm,dry     Labs/imaging that I havepersonally reviewed  (right click and "Reselect all SmartList Selections" daily)  ER workup includes: Labs CBC unremarkable, CMP bicarb 34 otherwise unremarkable, COVID PCR and Influenza A/B screen negative, and CBG 112.  9/29: CXR: obtained post intubation, NT tube at level of clavicles, torturous thoracic aorta, mild cardiomegaly, no acute cardiopulmonary process.  Resolved Hospital Problem list      ASSESSMENT AND PLAN  75 yo male presented to ER on 01/08/21 with allergic reaction, extremely edematous tongue and oral airway suspected Lisinopril angioedema, s/p nasotracheal intubation by ENT and admitted to the ICU 01/08/21 for vent mgmt and hemodynamic support on sedation.   Acute Hypoxic Respiratory Failure Angioedema - Lisinopril reaction Intubated in ER by ENT - nasotracheal tube  - Monitor airway edema - Angioedema management with Benadryl, Solumedrol, Pepcid - Hold Lisinopril - Continue Mechanical Ventilator support - Consider Bronchodilator  Therapy - Wean Fio2 and PEEP as tolerated - VAP/VENT bundle implementation - Will perform SAT/SBT when respiratory parameters are met and angioedema improved  Vent Mode: AC FiO2 (%):  [40 %] 40 % Set Rate:  [16 bmp] 16 bmp Vt Set:  [450 mL] 450 mL PEEP:  [5 cmH20] 5 cmH20   CARDIAC Bradycardia Chronic history of: Chronic diastolic CHF, CAD, Nonischemic cardiomyopathy, Hypertension, Hyperlipidemia - Home meds include amlodipine, Coreg, lisinopril, aspirin - DC lisinopril - Monitor bradycardia - Maintain MAP > 65 - Vasopressors if needed to maintain MAP goal  NEUROLOGY Need for sedation, daily sedation vacation to monitor neuro status Goal RASS -2 to -3  ENDO Insulin-dependent Type 2 Diabetes Mellitus - ICU hypoglycemic\Hyperglycemia protocol - Check FSBS per protocol - Home meds include Levemir 12 units at bedtime, Premeal insulin, metformin 500 mg daily  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 S/P anterior cervical spinal fusion C3-C4 Oct 2021, posterior cervical spinal fusion Jan 2022 Cervical myelopathy (HCC) Wheelchair/bed bound status - Chronic indwelling foley catheter - Chronic pain management with baclofen, oxycodone - Total skin assessment for breakdown - PT/OT once able to assess needs; patient receives PT service at home  GI GI PROPHYLAXIS as indicated  NUTRITIONAL STATUS DIET--> NPO for now Constipation protocol as indicated Patient takes bowel regime at home, continue  ELECTROLYTES -follow labs as needed -replace as needed -pharmacy consultation and following   Best practice (right click and "Reselect all SmartList Selections" daily)  Diet: NPO Pain/Anxiety/Delirium protocol (if indicated): Yes (RASS goal -1,-2) VAP protocol (if indicated): Yes DVT prophylaxis: Subcutaneous Heparin GI prophylaxis: H2B Glucose control:  SSI Yes Central venous access:  N/A Arterial line:  N/A Foley:  Yes, and it is still needed - chronic foley cath last  changed this week per wife Mobility:  bed rest  Code Status:  DNR Disposition:ICU  Labs   CBC: Recent Labs  Lab 01/08/21 0940  WBC 5.3  NEUTROABS 2.6  HGB 13.7  HCT 40.6  MCV 94.0  PLT 563    Basic Metabolic Panel: Recent Labs  Lab 01/08/21 0940  NA 140  K 4.0  CL 100  CO2 34*  GLUCOSE 84  BUN 17  CREATININE 0.60*  CALCIUM 9.9   GFR: Estimated Creatinine Clearance: 103.7 mL/min (A) (by C-G formula based on SCr of 0.6 mg/dL (L)). Recent Labs  Lab 01/08/21 0940  WBC 5.3    Liver Function Tests: Recent Labs  Lab 01/08/21 0940  AST 12*  ALT 17  ALKPHOS 91  BILITOT 0.8  PROT 6.8  ALBUMIN 3.5   No results for input(s): LIPASE, AMYLASE in the last 168 hours. No results for input(s): AMMONIA in the last 168 hours.  ABG No results found for: PHART, PCO2ART, PO2ART, HCO3, TCO2, ACIDBASEDEF, O2SAT   Coagulation Profile: No results for input(s): INR, PROTIME in the last 168 hours.  Cardiac Enzymes: No results for input(s): CKTOTAL, CKMB, CKMBINDEX, TROPONINI in the  last 168 hours.  HbA1C: Hgb A1c MFr Bld  Date/Time Value Ref Range Status  08/25/2020 01:22 AM 6.1 (H) 4.8 - 5.6 % Final    Comment:    (NOTE) Pre diabetes:          5.7%-6.4%  Diabetes:              >6.4%  Glycemic control for   <7.0% adults with diabetes   05/06/2020 08:41 AM 5.7 (H) 4.8 - 5.6 % Final    Comment:    (NOTE) Pre diabetes:          5.7%-6.4%  Diabetes:              >6.4%  Glycemic control for   <7.0% adults with diabetes     CBG: No results for input(s): GLUCAP in the last 168 hours.   Past Medical History:  He,  has a past medical history of Bedbound, Bilateral shoulder pain, CHF (congestive heart failure) ([place]), Coronary artery disease, Diabetes mellitus without complication ([place]), GERD (gastroesophageal reflux disease), Glaucoma, HLD (hyperlipidemia), Hypertension, Lumbar stenosis, Non-ischemic cardiomyopathy ([place]), PVC (premature ventricular contraction),  and Sleep apnea.   Surgical History:   Past Surgical History:  Procedure Laterality Date   ANTERIOR CERVICAL DECOMP/DISCECTOMY FUSION N/A 01/29/2020   Procedure: ANTERIOR CERVICAL DECOMPRESSION/DISCECTOMY FUSION 1 LEVEL C3/4;  Surgeon: Deetta Perla, MD;  Location: [facility] ORS;  Service: Neurosurgery;  Laterality: N/A;   arm surgery Right    4x surgery as a child, cut arm falling through glass window    HYDROCELE EXCISION / REPAIR     POSTERIOR CERVICAL FUSION/FORAMINOTOMY N/A 05/06/2020   Procedure: C3-4 LAMINECTOMY & C3-4 FUSION;  Surgeon: Deetta Perla, MD;  Location: [facility] ORS;  Service: Neurosurgery;  Laterality: N/A;   REPLACEMENT TOTAL KNEE Right      Social History:   reports that he has never smoked. He has never used smokeless tobacco. He reports that he does not drink alcohol and does not use drugs.   Family History:  His family history is not on file.   Allergies No Known Allergies   Home Medications  Prior to Admission medications   Medication Sig Start Date End Date Taking? Authorizing Provider  acetaminophen (TYLENOL) 500 MG tablet Take 2 tablets (1,000 mg total) by mouth every 6 (six) hours. 05/08/20  Yes Meade Maw, MD  amLODipine (NORVASC) 10 MG tablet Take 10 mg by mouth daily. 12/24/20  Yes [provider]  aspirin 81 MG chewable tablet Chew 81 mg by mouth daily.   Yes [provider]  baclofen (LIORESAL) 10 MG tablet Take 5 mg by mouth 3 (three) times daily.   Yes [provider]  brimonidine (ALPHAGAN) 0.2 % ophthalmic solution Place 1 drop into the right eye in the morning and at bedtime. 11/21/20  Yes [provider]  carvedilol (COREG) 6.25 MG tablet Take 6.25 mg by mouth 2 (two) times daily. 12/31/20  Yes [provider]  cyanocobalamin 2000 MCG tablet Take 2,000 mcg by mouth daily. 01/24/19  Yes [provider]  famotidine (PEPCID) 20 MG tablet Take 20 mg by mouth every evening.   Yes [provider]  gabapentin (NEURONTIN) 300 MG capsule Take 300 mg by mouth at bedtime.   Yes [provider]  insulin aspart (NOVOLOG) 100 UNIT/ML injection Inject 4-8 Units into the skin 3 (three) times daily with meals. Sliding scale   Yes [provider]  insulin detemir (LEVEMIR FLEXTOUCH) 100 UNIT/ML FlexPen Inject 5  Units into the skin at bedtime. Patient taking differently: Inject 12 Units into the skin at bedtime. 08/27/20  Yes Dahal, Marlowe Aschoff, MD  latanoprost (XALATAN) 0.005 % ophthalmic solution Place 1 drop into the right eye at bedtime. 08/20/19  Yes [provider]  lisinopril (ZESTRIL) 40 MG tablet Take 40 mg by mouth daily. 12/18/20  Yes [provider]  meloxicam (MOBIC) 7.5 MG tablet Take 1 tablet by mouth daily. 09/19/20  Yes [provider]  metFORMIN (GLUCOPHAGE) 500 MG tablet Take 500 mg by mouth 2 (two) times daily with a meal.   Yes [provider]  Multiple Vitamins-Minerals (CENTRUM SILVER 50+MEN PO) Take 1 tablet by mouth daily.   Yes [provider]  polyethylene glycol powder (GLYCOLAX/MIRALAX) 17 GM/SCOOP powder Take 0.5 Containers by mouth daily as needed for moderate constipation.   Yes [provider]  simvastatin (ZOCOR) 20 MG tablet Take 20 mg by mouth daily at 6 PM.   Yes [provider]  tamsulosin (FLOMAX) 0.4 MG CAPS capsule Take 1 capsule (0.4 mg total) by mouth daily after supper. 01/25/20  Yes Wouk, Ailene Rud, MD  carvedilol (COREG) 3.125 MG tablet Take 2 tablets (6.25 mg total) by mouth 2 (two) times daily. 08/27/20 09/26/20  Dahal, Marlowe Aschoff, MD  guaiFENesin-dextromethorphan (ROBITUSSIN DM) 100-10 MG/5ML syrup Take 10 mLs by mouth every 4 (four) hours as needed for cough. Patient not taking: No sig reported 08/27/20   Terrilee Croak, MD     Enid Cutter, PA-S Daylene Katayama   DVT/GI PRX  assessed I Assessed the need for Labs I Assessed the need for Foley I Assessed the need for  Central Venous Line Family Discussion when available I Assessed the need for Mobilization I made an Assessment of medications to be adjusted accordingly Safety Risk assessment completed  CASE DISCUSSED IN MULTIDISCIPLINARY ROUNDS WITH ICU TEAM     Critical Care Time devoted to patient care services described in this note is 55 minutes.   Critical care was necessary to treat /prevent imminent and life-threatening deterioration.   PATIENT WITH VERY POOR PROGNOSIS I ANTICIPATE PROLONGED ICU LOS  Patient is critically ill. Patient with Multiorgan failure and at high risk for cardiac arrest and death.    Corrin Parker, M.D.  [facility]  Medical Director [place] Director [facility] Cardio-Pulmonary Department

## 2021-01-08 NOTE — ED Triage Notes (Signed)
Pt here via [facility] from home with an allergic reaction to lisinopril taken last night. Pt's tongue is severely swollen. Pt airway intact at this moment. EDP at bedside.

## 2021-01-08 NOTE — ED Notes (Signed)
ENT at bedside

## 2021-01-08 NOTE — ED Notes (Signed)
Dee RN aware of assigned bed 

## 2021-01-08 NOTE — Progress Notes (Signed)
1230 patient arrived from ER with ETT vented  via right nare pt following commands wife at bedside pt on prop. CHG ADL care completed, wife able to answer all questions regarding patient care she provides all patient care at home. ETT adjusted from transport. 1300 Prop increased to help patient tolerate ETT 1700 cxr completed ETT needs to be advanced 1730 ETT advanced pt agitated attempted to give fentanyl would not scan patient ands patient wife agreeable to give versed 2mg  IV given

## 2021-01-08 NOTE — Op Note (Addendum)
..  01/08/2021  11:16 AM    Tonna Corner  [id number]   Pre-Op Dx:  Respiratory failure (HCC) [J96.90]  Post-op Dx: Respiratory failure (HCC) [J96.90]  Proc: Emergent Trans-nasal flexible fiberoptic intubation  Surg: Roney Mans Ailani Governale  Anes:  local  EBL:  None  Comp:  None  Findings:  Severe base of tongue edema and lingual surface of epiglottis edema.  Scobe and size 6 ETT successfully passed into patient's trachea and secured.  Procedure: With the patient in an upright sitting position, Afrin was sprayed into the patient's right nares.  This was allowed to take effect for 30 seconds.  Next a size 6 nasal trumpet with viscous lidocaine was inserted into the patient's right nares for 1 minute.  This was upsized to a size 8 nasal trumpet and again left to dilate right nares for 1 minute.  This was removed and fiberoptic bronchoscope was inserted into the patient's right nasal cavity with ETT loaded on to it.  The ETT was gradually advanced through his nasal cavity into his oropharynx.  At this time the fiberoptic scope was advanced past the edematous epiglottis and the larynx was visualized.  The scope was passed through the level of the cords and tracheal rings seen.   The ETT was advanced at this time over the scope and ventilation began.  Good CO2 return was seen and patient's saturations never dropped.   Following this  The patient was returned to ER where he will be admitted to ICU in guarded condition.  Plan: Continue ETT until swelling subsides and good air leak present.   Zala Degrasse 11:16 AM 01/08/2021

## 2021-01-08 NOTE — Consult Note (Signed)
..Roy [name], Roy [name] [id number] 08/30/1945 Erin Fulling, MD  Reason for Consult: acute respiratory distress  HPI: Called emergently at 09:43 a.m. from ER Dr. Scotty Court regarding acute onset of angioedema with impending airway compromise.  Portions of patient's ROS and HPI is limited due to emergent nature and patient's difficulty with communication.  From wife and patient, however swelling of tongue starting around 4 to 5 this morning and patient woke wife up at 8.  She gave him some benadryl but things did not get much better so they called EMS.  He reports difficulty talking and swallowing.  Some breathing issues but he reports some improvement in oral tongue swelling since coming to hospital.  Very difficult to understand patient's speech due to severe swelling of tongue and limited mobility.  Allergies: No Known Allergies  ROS: Review of systems normal other than 12 systems except per HPI.  PMH:  Past Medical History:  Diagnosis Date   Bedbound    Bilateral shoulder pain    CHF (congestive heart failure) (HCC)    Coronary artery disease    Diabetes mellitus without complication (HCC)    GERD (gastroesophageal reflux disease)    Glaucoma    HLD (hyperlipidemia)    Hypertension    Lumbar stenosis    Non-ischemic cardiomyopathy (HCC)    PVC (premature ventricular contraction)    Sleep apnea    H/O NO CPAP IN 17 YRS    FH: No family history on file.  SH:  Social History   Socioeconomic History   Marital status: Married    Spouse name: Not on file   Number of children: Not on file   Years of education: Not on file   Highest education level: Not on file  Occupational History   Not on file  Tobacco Use   Smoking status: Never   Smokeless tobacco: Never  Vaping Use   Vaping Use: Never used  Substance and Sexual Activity   Alcohol use: No   Drug use: Never   Sexual activity: Not on file  Other Topics Concern   Not on file  Social History Narrative   Not on file    Social Determinants of Health   Financial Resource Strain: Not on file  Food Insecurity: Not on file  Transportation Needs: Not on file  Physical Activity: Not on file  Stress: Not on file  Social Connections: Not on file  Intimate Partner Violence: Not on file    PSH:  Past Surgical History:  Procedure Laterality Date   ANTERIOR CERVICAL DECOMP/DISCECTOMY FUSION N/A 01/29/2020   Procedure: ANTERIOR CERVICAL DECOMPRESSION/DISCECTOMY FUSION 1 LEVEL C3/4;  Surgeon: [name], MD;  Location: [facility] ORS;  Service: Neurosurgery;  Laterality: N/A;   arm surgery Right    4x surgery as a child, cut arm falling through glass window    HYDROCELE EXCISION / REPAIR     POSTERIOR CERVICAL FUSION/FORAMINOTOMY N/A 05/06/2020   Procedure: C3-4 LAMINECTOMY & C3-4 FUSION;  Surgeon: [name], MD;  Location: [facility] ORS;  Service: Neurosurgery;  Laterality: N/A;   REPLACEMENT TOTAL KNEE Right     Physical  Exam:  GEN-  sitting upright in bed with inability to tolerate secretions due to massive tongue edema OC/OP-  oral cavity completely filled with engorged firm tongue with active bleeding from bottom teeth lacerating tongue due to swelling.  Patient drooling due to inability to swallow.  Floor of mouth edema. NECK-  Normal neck with midline trachea.  Previous cervical fusion scars RESP- wet sounding  voice but no stridor  Procedure:  Transnasal flexible laryngoscopy:  After verbal consent obtained, the felxible laryngoscope was inserted into the patient's right nasal cavity.  No Afrin or lidocaine was used intially due to concern of patient's airway.  This demonstrate obliteration of the valleculae due to lingual edema and his lingual surface of epiglottis with significant boggy edema.  The epiglottis was pushed against the posterior pharynx due to lingual edema.  The laryngeal surface of epiglottis and remaining glottis was clear with vocal folds WNL.   A/P: Angioedema with impending airway  compromise and invovlement of lingual surface of epiglottis  Plan:  Patient had slight improvement in oral tongue swelling while in ER but given severe posterior swellling and involvement of epiglottis and actual worsening discussed with patient and wife that trans-nasal fiberoptic intubation is recommended and they agree and this was performed emergently at bedside.   Linzy Laury 01/08/2021 11:06 AM

## 2021-01-09 ENCOUNTER — Inpatient Hospital Stay: Payer: Medicare HMO

## 2021-01-09 DIAGNOSIS — T783XXA Angioneurotic edema, initial encounter: Secondary | ICD-10-CM | POA: Diagnosis not present

## 2021-01-09 DIAGNOSIS — Z789 Other specified health status: Secondary | ICD-10-CM | POA: Diagnosis not present

## 2021-01-09 DIAGNOSIS — J9601 Acute respiratory failure with hypoxia: Secondary | ICD-10-CM | POA: Diagnosis not present

## 2021-01-09 LAB — BLOOD GAS, ARTERIAL
Acid-Base Excess: 1 mmol/L (ref 0.0–2.0)
Bicarbonate: 26 mmol/L (ref 20.0–28.0)
FIO2: 0.21
MECHVT: 450 mL
Mechanical Rate: 16
O2 Saturation: 96.4 %
PEEP: 5 cmH2O
Patient temperature: 37
RATE: 16 resp/min
pCO2 arterial: 42 mmHg (ref 32.0–48.0)
pH, Arterial: 7.4 (ref 7.350–7.450)
pO2, Arterial: 85 mmHg (ref 83.0–108.0)

## 2021-01-09 LAB — BASIC METABOLIC PANEL
Anion gap: 13 (ref 5–15)
BUN: 28 mg/dL — ABNORMAL HIGH (ref 8–23)
CO2: 25 mmol/L (ref 22–32)
Calcium: 9.6 mg/dL (ref 8.9–10.3)
Chloride: 100 mmol/L (ref 98–111)
Creatinine, Ser: 0.89 mg/dL (ref 0.61–1.24)
GFR, Estimated: >60 mL/min (ref >60)
Glucose, Bld: 161 mg/dL — ABNORMAL HIGH (ref 70–99)
Potassium: 4.2 mmol/L (ref 3.5–5.1)
Sodium: 138 mmol/L (ref 135–145)

## 2021-01-09 LAB — CBC
HCT: 40.8 % (ref 39.0–52.0)
Hemoglobin: 13.6 g/dL (ref 13.0–17.0)
MCH: 31 pg (ref 26.0–34.0)
MCHC: 33.3 g/dL (ref 30.0–36.0)
MCV: 92.9 fL (ref 80.0–100.0)
Platelets: 234 10*3/uL (ref 150–400)
RBC: 4.39 MIL/uL (ref 4.22–5.81)
RDW: 12.6 % (ref 11.5–15.5)
WBC: 7.9 10*3/uL (ref 4.0–10.5)
nRBC: 0 % (ref 0.0–0.2)

## 2021-01-09 LAB — TRIGLYCERIDES: Triglycerides: 82 mg/dL (ref <150)

## 2021-01-09 IMAGING — DX DG CHEST 1V PORT
2 series · 2 of 2 positions shown · non-contrast
Comparison: [DATE]

CLINICAL DATA: Respiratory failure

EXAM:
PORTABLE CHEST 1 VIEW

[chest ap (1 of 2)]
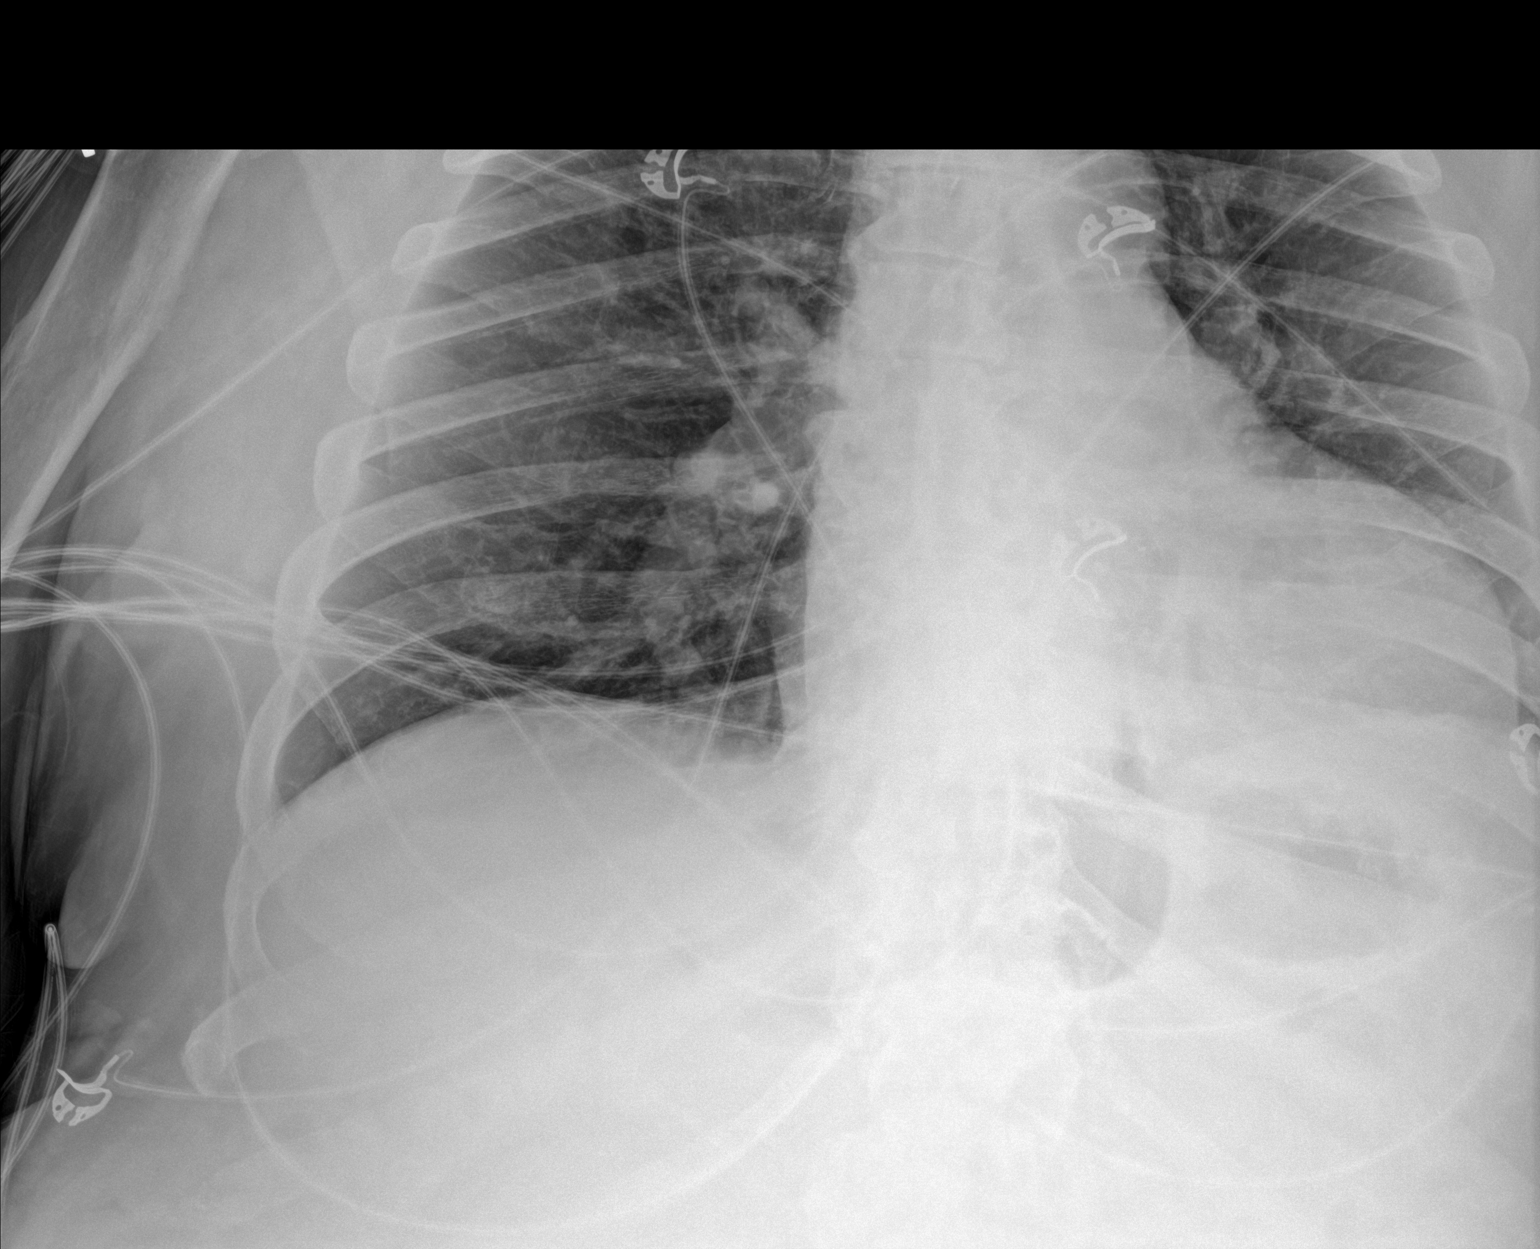

[chest ap (2 of 2)]
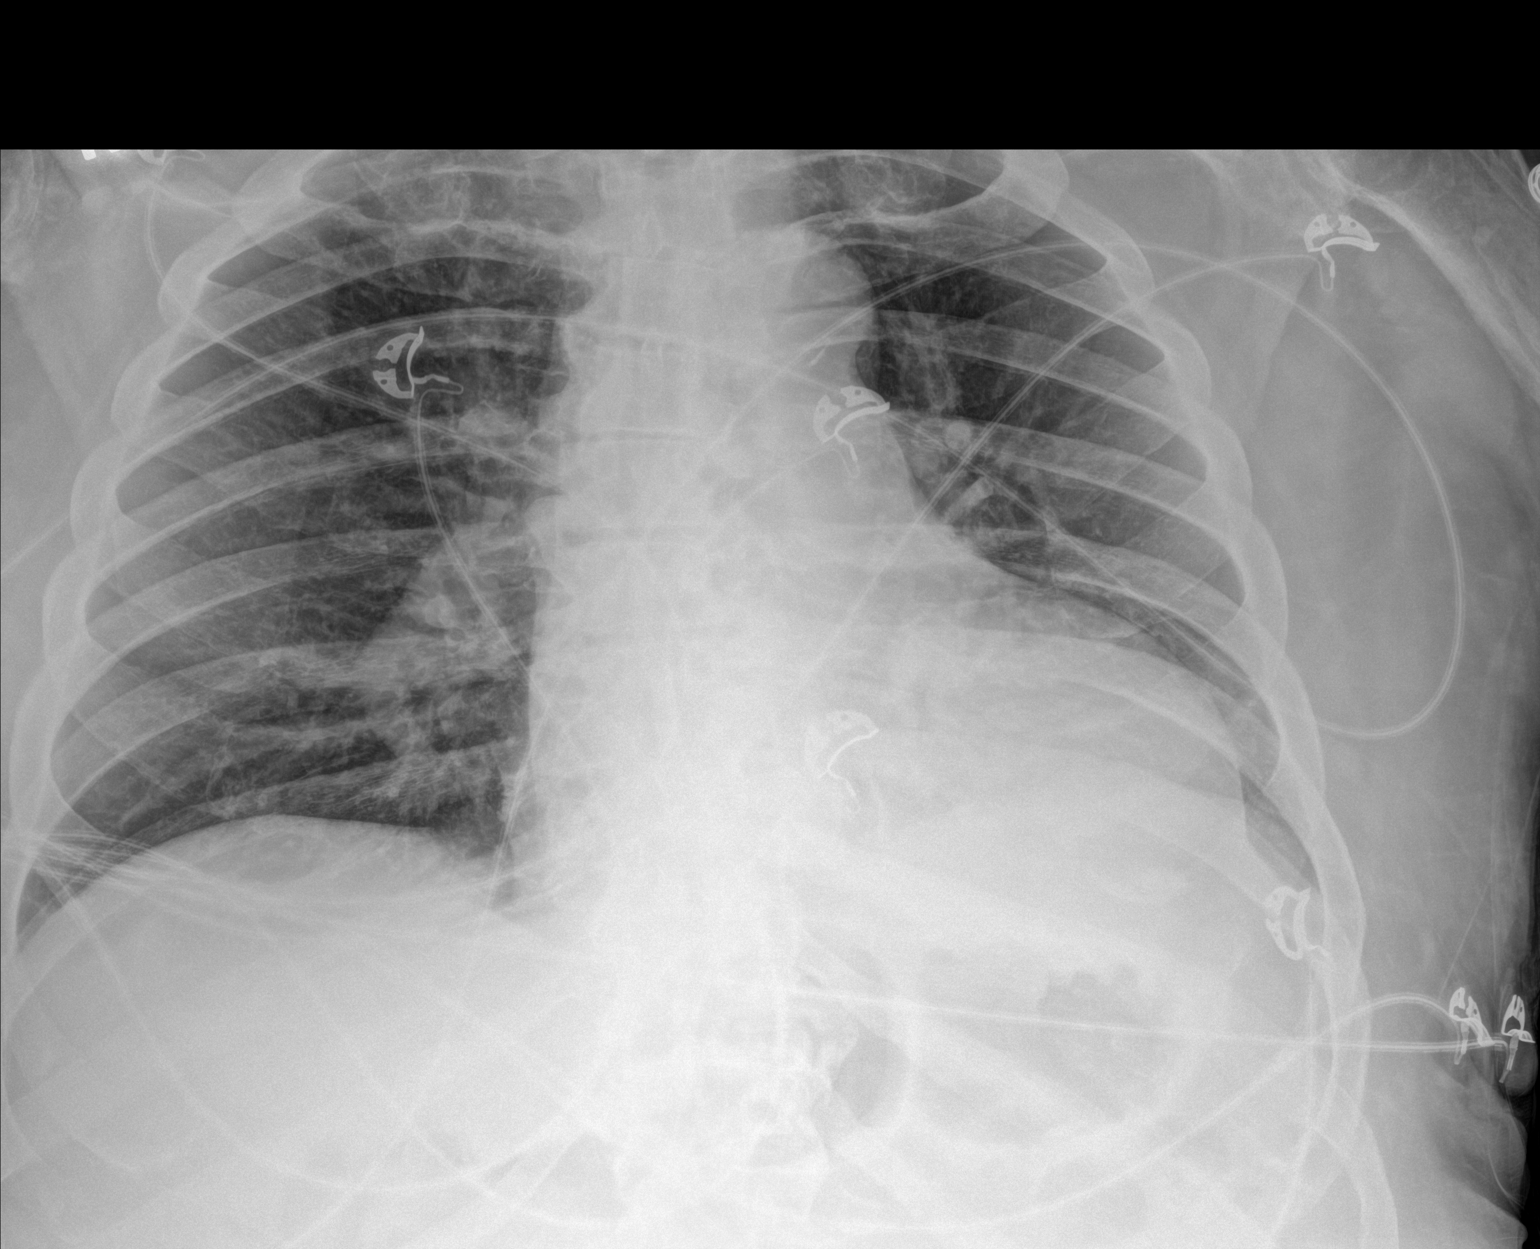

[2 of 2 positions shown; findings below may reference images not displayed]

FINDINGS: Similar low lung volumes. Aeration is similar with no new
consolidation or edema. No significant pleural effusion. No
pneumothorax. Stable cardiomediastinal contours. Endotracheal tube
is not well seen and may be at the thoracic inlet.
IMPRESSION: Similar lung aeration. Endotracheal tube is not well seen and may be
at the thoracic inlet.

## 2021-01-09 MED ORDER — METHOCARBAMOL 1000 MG/10ML IJ SOLN
500.0000 mg | Freq: Three times a day (TID) | INTRAVENOUS | Status: AC
  Administered 2021-01-09 – 2021-01-10 (×5): 500 mg via INTRAVENOUS
  Filled 2021-01-09 (×5): qty 5

## 2021-01-09 MED ORDER — DEXAMETHASONE SODIUM PHOSPHATE 10 MG/ML IJ SOLN
6.0000 mg | Freq: Four times a day (QID) | INTRAMUSCULAR | Status: DC
  Administered 2021-01-09 – 2021-01-11 (×8): 6 mg via INTRAVENOUS
  Filled 2021-01-09 (×9): qty 0.6

## 2021-01-09 MED ORDER — CHLORHEXIDINE GLUCONATE CLOTH 2 % EX PADS
6.0000 | MEDICATED_PAD | Freq: Every day | CUTANEOUS | Status: DC
  Administered 2021-01-09: 6 via TOPICAL

## 2021-01-09 NOTE — Progress Notes (Signed)
NAME:  Roy Barnhart., MRN:  [id number], DOB:  10-26-1945, LOS: 1 ADMISSION DATE:  01/08/2021 CONSULTATION DATE:  01/08/2021 REFERRING MD:  Carrie Mew CHIEF COMPLAINT:  Allergic reaction  75 yo male presented to ER on 01/08/21 with allergic reaction, extremely edematous tongue and oral airway suspected Lisinopril angioedema, s/p nasotracheal intubation by ENT and admitted to the ICU 01/08/21 for vent mgmt and hemodynamic support on sedation.   History of Present Illness:  Roy [name], Roy [name] is a 75 year old male with past medical history including CHF, CAD, HTN, HLD, Non-ischemic CM, IDDM, OSA without CPAP, BPH with chronic foley catheter, and chronic debility bedbound status following ACDF C3-C4 in Oct 2021 and PCDF C3-C4 in Jan 2022. Patient is dependent on his wife, son, home health, and PT for assist with his ADLs and strength, mobility training and uses a Hoyer lift for bed to chair transitions. He has a foley catheter exchange monthly and follows urology outpatient for this care. Per his wife, patient's last foley catheter exchange was earlier this week. He is also extremely hard of hearing, blind in the left eye, and with significant generalized weakness worse on the left side post cervical fusion earlier this year. Patient's wife reports that patient is DNR which he has discussed in detail with his wife and sons. Of note, the patient has been taking Lisinopril for "many years" per his wife without history of allergic reaction. Last dose was prior to bedtime 9/28.  Patient was brought to the ER via EMS 01/08/21 due to tongue swelling and oral bleeding and was ultimately intubated by ENT with a nasotracheal tube for airway protection given diffuse angioedema of tongue and epiglottis. Patient was in his usual state of health until early am 9/29. He woke up at 6 am and noticed some tongue swelling; however, he was not having trouble breathing so he did not wake his wife. At 8 am,  his tongue swelling was getting worse, so the patient woke his wife for assistance. His tongue was protruding from his mouth with diffuse swelling and some bleeding from under the tongue. The patient reportedly never had trouble breathing during this at home; however, his wife is a retired Immunologist and decided that she could not manage the bleeding or possible respiratory distress if his swelling continued to worsen. On arrival to the ER, patient noted with tongue extremely edematous and protruding from mouth with abrasions under the tongue from teeth scraping, fullness of oral floor and submandibular space, and difficulty speaking in mild respiratory distress. ENT was consulted for difficult airway in setting of emergent intubation for airway protection. He was given 58m Ativan prior to intubation for anxiolysis and was intubated nasotracheal by ENT Dr VPryor Ochoawith sedation initiated post intubation. CXR was obtained post intubation which showed NT tube at level of clavicles an no acute cardiopulmonary process.   Pertinent  Medical History   Past Medical History:  Diagnosis Date   Bedbound    Bilateral shoulder pain    CHF (congestive heart failure) (HCC)    Coronary artery disease    Diabetes mellitus without complication (HCC)    GERD (gastroesophageal reflux disease)    Glaucoma    HLD (hyperlipidemia)    Hypertension    Lumbar stenosis    Non-ischemic cardiomyopathy (HCC)    PVC (premature ventricular contraction)    Sleep apnea    H/O NO CPAP IN 18 YRS   Significant Hospital Events: Including procedures, antibiotic start  and stop dates in addition to other pertinent events   9/29 Presented to ER via EMS with extreme tongue edema, oral secretions and bleeding. Suspected Lisinopril reaction 9/29 Intubated by ENT with nasotracheal tube for airway protection after noted with tongue extremely edematous and protruding from mouth with abrasions under the tongue from teeth scraping, fullness of oral  floor and submandibular space, and difficulty speaking, mild respiratory distress. 9/29 Admitted to ICU for vent mgmt, hemodynamic monitoring on sedation 9/30 Remains intubated, sedated on propofol, arousable and following simple commands. Tongue/oral swelling improved as compared to yesterday but not resolved. CXR today stable with nasotracheal tube not well seen, remains high ? At thoracic inlet on report  Micro Data:  9/29: Respiratory viral COVID PCR + Influenza A/B: Negative  Antimicrobials:  None  Interim History / Subjective:  Events overnight: required prn IV versed last night for agitation otherwise no acute events overnight Patient remains intubated on 35% FIO2, sedated but arousable and following simple commands. Tongue/oral swelling improved as compared to yesterday but not resolved. Noted bilateral leg twitching which patient's wife reports is chronic for him and managed with Baclofen TID Patient's wife at bedside.   Objective   Blood pressure 127/66, pulse 66, temperature (!) 97.4 F (36.3 C), temperature source Axillary, resp. rate 17, height 6' 1"  (1.854 m), weight 107.1 kg, SpO2 94 %.    Vent Mode: PRVC FiO2 (%):  [21 %-40 %] 35 % Set Rate:  [16 bmp] 16 bmp Vt Set:  [450 mL] 450 mL PEEP:  [5 cmH20] 5 cmH20 Plateau Pressure:  [12 cmH20] 12 cmH20   Intake/Output Summary (Last 24 hours) at 01/09/2021 9021 Last data filed at 01/09/2021 0600 Gross per 24 hour  Intake 546.25 ml  Output 680 ml  Net -133.75 ml   Filed Weights   01/08/21 0941 01/09/21 0458  Weight: 110 kg 107.1 kg    REVIEW OF SYSTEMS Patient is unable to provide review of systems at this time due to intubation/sedation.  PHYSICAL EXAMINATION:  GENERAL: no acute distress, +nasotracheal tube EYES: Pupils equal, round, reactive to light.  No scleral icterus.  MOUTH: Moist mucosal membrane. INTUBATED + tongue edematous, some improvement compared to yesterday NECK: Supple. + submandibular  fullness PULMONARY: +rhonchi, no wheezing CARDIOVASCULAR: S1 and S2.  No murmurs  GASTROINTESTINAL: Soft, nontender, non-distended. Positive bowel sounds, sluggish MUSCULOSKELETAL: No swelling, clubbing, or edema.  NEUROLOGIC: sedated. Moving all 4 extremities independently, with weakness left > right chronic/baseline, bilateral leg twitching chronic SKIN: Intact,warm,dry     Labs/imaging that I havepersonally reviewed  (right click and "Reselect all SmartList Selections" daily)  ER workup includes: Labs CBC unremarkable, CMP bicarb 34 otherwise unremarkable, COVID PCR and Influenza A/B screen negative, and CBG 112.  9/29: CXR: obtained post intubation, NT tube at level of clavicles, torturous thoracic aorta, mild cardiomegaly, no acute cardiopulmonary process.  9/30 CXR: cardiopulm status stable no acute process. NT tube not well visualized, remains possibly at level of thoracic inlet - clavicles  Resolved Hospital Problem list      ASSESSMENT AND PLAN  75 yo male presented to ER on 01/08/21 with allergic reaction, extremely edematous tongue and oral airway  Lisinopril angioedema, s/p nasotracheal intubation by ENT and admitted to the ICU 01/08/21 for vent mgmt and hemodynamic support on sedation.   Acute Hypoxic Respiratory Failure Angioedema - Lisinopril reaction Intubated in ER by ENT - nasotracheal tube  - Monitor airway edema - some improvement today compared to yesterday - Angioedema management  with Benadryl, Pepcid - changed solumedrol to Decadron 6 mg IV q6h 01/09/21 - Continue Mechanical Ventilator support - Consider Bronchodilator Therapy - Wean Fio2 and PEEP as tolerated - VAP/VENT bundle implementation - Will perform SAT/SBT when respiratory parameters are met and angioedema improved  Vent Mode: PRVC FiO2 (%):  [21 %-40 %] 35 % Set Rate:  [16 bmp] 16 bmp Vt Set:  [450 mL] 450 mL PEEP:  [5 cmH20] 5 cmH20 Plateau Pressure:  [12 cmH20] 12  cmH20   CARDIAC Bradycardia Chronic history of: Chronic diastolic CHF, CAD, Nonischemic cardiomyopathy, Hypertension, Hyperlipidemia - Home meds include amlodipine, Coreg, lisinopril, aspirin - DC lisinopril - Monitor bradycardia - Maintain MAP > 65 - Vasopressors if needed to maintain MAP goal  NEUROLOGY Need for sedation, daily sedation vacation to monitor neuro status Goal RASS -2 to -3  ENDO Insulin-dependent Type 2 Diabetes Mellitus - ICU hypoglycemic\Hyperglycemia protocol - Check FSBS per protocol - Home meds include Levemir 12 units at bedtime, Premeal insulin, metformin 500 mg daily  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 S/P anterior cervical spinal fusion C3-C4 Oct 2021, posterior cervical spinal fusion Jan 2022 Cervical myelopathy (HCC) Wheelchair/bed bound status - Chronic indwelling foley catheter - Chronic pain management with baclofen, oxycodone - Add Robaxin 535m IV q8h; restart home po meds once extubated and cleared for oral intake - Total skin assessment for breakdown - PT/OT once able to assess needs; patient receives PT service at home  GI GI PROPHYLAXIS as indicated  NUTRITIONAL STATUS DIET--> NPO no NG/OG tube in place Constipation protocol as indicated Patient takes bowel regime at home, continue  ELECTROLYTES -follow labs as needed -replace as needed -pharmacy consultation and following   Best practice (right click and "Reselect all SmartList Selections" daily)  Diet: NPO Pain/Anxiety/Delirium protocol (if indicated): Yes (RASS goal -1,-2) VAP protocol (if indicated): Yes DVT prophylaxis: Subcutaneous Heparin GI prophylaxis: H2B Glucose control:  SSI Yes Central venous access:  N/A Arterial line:  N/A Foley:  Yes, and it is still needed - chronic foley cath last changed this week per wife Mobility:  bed rest  Code Status:  DNR Disposition:ICU  Labs   CBC: Recent Labs  Lab 01/08/21 0940 01/09/21 0542  WBC 5.3 7.9   NEUTROABS 2.6  --   HGB 13.7 13.6  HCT 40.6 40.8  MCV 94.0 92.9  PLT 210 2242   Basic Metabolic Panel: Recent Labs  Lab 01/08/21 0940 01/09/21 0542  NA 140 138  K 4.0 4.2  CL 100 100  CO2 34* 25  GLUCOSE 84 161*  BUN 17 28*  CREATININE 0.60* 0.89  CALCIUM 9.9 9.6   GFR: Estimated Creatinine Clearance: 92.1 mL/min (by C-G formula based on SCr of 0.89 mg/dL). Recent Labs  Lab 01/08/21 0940 01/09/21 0542  WBC 5.3 7.9    Liver Function Tests: Recent Labs  Lab 01/08/21 0940  AST 12*  ALT 17  ALKPHOS 91  BILITOT 0.8  PROT 6.8  ALBUMIN 3.5   No results for input(s): LIPASE, AMYLASE in the last 168 hours. No results for input(s): AMMONIA in the last 168 hours.  ABG    Component Value Date/Time   PHART 7.40 01/09/2021 0311   PCO2ART 42 01/09/2021 0311   PO2ART 85 01/09/2021 0311   HCO3 26.0 01/09/2021 0311   O2SAT 96.4 01/09/2021 0311     Coagulation Profile: No results for input(s): INR, PROTIME in the last 168 hours.  Cardiac Enzymes: No results for input(s): CKTOTAL, CKMB,  CKMBINDEX, TROPONINI in the last 168 hours.  HbA1C: Hgb A1c MFr Bld  Date/Time Value Ref Range Status  08/25/2020 01:22 AM 6.1 (H) 4.8 - 5.6 % Final    Comment:    (NOTE) Pre diabetes:          5.7%-6.4%  Diabetes:              >6.4%  Glycemic control for   <7.0% adults with diabetes   05/06/2020 08:41 AM 5.7 (H) 4.8 - 5.6 % Final    Comment:    (NOTE) Pre diabetes:          5.7%-6.4%  Diabetes:              >6.4%  Glycemic control for   <7.0% adults with diabetes     CBG: Recent Labs  Lab 01/08/21 1151  GLUCAP 112*    Allergies Allergies  Allergen Reactions   Ace Inhibitors Other (See Comments)    Angioedema     Home Medications  Prior to Admission medications   Medication Sig Start Date End Date Taking? Authorizing Provider  acetaminophen (TYLENOL) 500 MG tablet Take 2 tablets (1,000 mg total) by mouth every 6 (six) hours. 05/08/20  Yes Meade Maw, MD  amLODipine (NORVASC) 10 MG tablet Take 10 mg by mouth daily. 12/24/20  Yes [provider]  aspirin 81 MG chewable tablet Chew 81 mg by mouth daily.   Yes [provider]  baclofen (LIORESAL) 10 MG tablet Take 5 mg by mouth 3 (three) times daily.   Yes [provider]  brimonidine (ALPHAGAN) 0.2 % ophthalmic solution Place 1 drop into the right eye in the morning and at bedtime. 11/21/20  Yes [provider]  carvedilol (COREG) 6.25 MG tablet Take 6.25 mg by mouth 2 (two) times daily. 12/31/20  Yes [provider]  cyanocobalamin 2000 MCG tablet Take 2,000 mcg by mouth daily. 01/24/19  Yes [provider]  famotidine (PEPCID) 20 MG tablet Take 20 mg by mouth every evening.   Yes [provider]  gabapentin (NEURONTIN) 300 MG capsule Take 300 mg by mouth at bedtime.   Yes [provider]  insulin aspart (NOVOLOG) 100 UNIT/ML injection Inject 4-8 Units into the skin 3 (three) times daily with meals. Sliding scale   Yes [provider]  insulin detemir (LEVEMIR FLEXTOUCH) 100 UNIT/ML FlexPen Inject 5 Units into the skin at bedtime. Patient taking differently: Inject 12 Units into the skin at bedtime. 08/27/20  Yes Dahal, Marlowe Aschoff, MD  latanoprost (XALATAN) 0.005 % ophthalmic solution Place 1 drop into the right eye at bedtime. 08/20/19  Yes [provider]  lisinopril (ZESTRIL) 40 MG tablet Take 40 mg by mouth daily. 12/18/20  Yes [provider]  meloxicam (MOBIC) 7.5 MG tablet Take 1 tablet by mouth daily. 09/19/20  Yes [provider]  metFORMIN (GLUCOPHAGE) 500 MG tablet Take 500 mg by mouth 2 (two) times daily with a meal.   Yes [provider]  Multiple Vitamins-Minerals (CENTRUM SILVER 50+MEN PO) Take 1 tablet by mouth daily.   Yes [provider]  polyethylene glycol powder (GLYCOLAX/MIRALAX) 17 GM/SCOOP powder Take 0.5 Containers by mouth daily as needed for moderate  constipation.   Yes [provider]  simvastatin (ZOCOR) 20 MG tablet Take 20 mg by mouth daily at 6 PM.   Yes [provider]  tamsulosin (FLOMAX) 0.4 MG CAPS capsule Take 1 capsule (0.4 mg total) by mouth daily after supper. 01/25/20  Yes Wouk, Ailene Rud, MD  carvedilol (COREG) 3.125 MG tablet Take 2 tablets (6.25 mg total) by mouth 2 (two) times daily. 08/27/20 09/26/20  Dahal, Marlowe Aschoff, MD  guaiFENesin-dextromethorphan (ROBITUSSIN DM) 100-10 MG/5ML syrup Take 10 mLs by mouth every 4 (four) hours as needed for cough. Patient not taking: No sig reported 08/27/20   Terrilee Croak, MD     Enid Cutter, PA-S Daylene Katayama   DVT/GI PRX  assessed I Assessed the need for Labs I Assessed the need for Foley I Assessed the need for Central Venous Line Family Discussion when available I Assessed the need for Mobilization I made an Assessment of medications to be adjusted accordingly Safety Risk assessment completed  CASE DISCUSSED IN MULTIDISCIPLINARY ROUNDS WITH ICU TEAM     Critical Care Time devoted to patient care services described in this note is 55 minutes.   Critical care was necessary to treat /prevent imminent and life-threatening deterioration.    Corrin Parker, M.D.  [facility]  Medical Director [place] Director [facility] Cardio-Pulmonary Department

## 2021-01-09 NOTE — Progress Notes (Signed)
..01/09/2021 8:47 AM  Roy [name] [id number]  Post-Op Day 1    Temp:  [95.4 F (35.2 C)-97.7 F (36.5 C)] 97.4 F (36.3 C) (09/30 0800) Pulse Rate:  [50-70] 66 (09/30 0800) Resp:  [11-20] 17 (09/30 0800) BP: (107-190)/(63-96) 127/66 (09/30 0800) SpO2:  [90 %-100 %] 94 % (09/30 0822) FiO2 (%):  [21 %-40 %] 35 % (09/30 0822) Weight:  [107.1 kg-110 kg] 107.1 kg (09/30 0458),     Intake/Output Summary (Last 24 hours) at 01/09/2021 0847 Last data filed at 01/09/2021 0600 Gross per 24 hour  Intake 546.25 ml  Output 680 ml  Net -133.75 ml    Results for orders placed or performed during the hospital encounter of 01/08/21 (from the past 24 hour(s))  Comprehensive metabolic panel     Status: Abnormal   Collection Time: 01/08/21  9:40 AM  Result Value Ref Range   Sodium 140 135 - 145 mmol/L   Potassium 4.0 3.5 - 5.1 mmol/L   Chloride 100 98 - 111 mmol/L   CO2 34 (H) 22 - 32 mmol/L   Glucose, Bld 84 70 - 99 mg/dL   BUN 17 8 - 23 mg/dL   Creatinine, Ser 4.09 (L) 0.61 - 1.24 mg/dL   Calcium 9.9 8.9 - 81.1 mg/dL   Total Protein 6.8 6.5 - 8.1 g/dL   Albumin 3.5 3.5 - 5.0 g/dL   AST 12 (L) 15 - 41 U/L   ALT 17 0 - 44 U/L   Alkaline Phosphatase 91 38 - 126 U/L   Total Bilirubin 0.8 0.3 - 1.2 mg/dL   GFR, Estimated >91 >47 mL/min   Anion gap 6 5 - 15  CBC with Differential     Status: None   Collection Time: 01/08/21  9:40 AM  Result Value Ref Range   WBC 5.3 4.0 - 10.5 K/uL   RBC 4.32 4.22 - 5.81 MIL/uL   Hemoglobin 13.7 13.0 - 17.0 g/dL   HCT 82.9 56.2 - 13.0 %   MCV 94.0 80.0 - 100.0 fL   MCH 31.7 26.0 - 34.0 pg   MCHC 33.7 30.0 - 36.0 g/dL   RDW 86.5 78.4 - 69.6 %   Platelets 210 150 - 400 K/uL   nRBC 0.0 0.0 - 0.2 %   Neutrophils Relative % 49 %   Neutro Abs 2.6 1.7 - 7.7 K/uL   Lymphocytes Relative 38 %   Lymphs Abs 2.0 0.7 - 4.0 K/uL   Monocytes Relative 7 %   Monocytes Absolute 0.4 0.1 - 1.0 K/uL   Eosinophils Relative 5 %   Eosinophils Absolute 0.3 0.0 -  0.5 K/uL   Basophils Relative 1 %   Basophils Absolute 0.1 0.0 - 0.1 K/uL   Immature Granulocytes 0 %   Abs Immature Granulocytes 0.01 0.00 - 0.07 K/uL  Resp Panel by RT-PCR (Flu A&B, Covid) Nasopharyngeal Swab     Status: None   Collection Time: 01/08/21 10:39 AM   Specimen: Nasopharyngeal Swab; Nasopharyngeal(NP) swabs in vial transport medium  Result Value Ref Range   SARS Coronavirus 2 by RT PCR NEGATIVE NEGATIVE   Influenza A by PCR NEGATIVE NEGATIVE   Influenza B by PCR NEGATIVE NEGATIVE  CBG monitoring, ED     Status: Abnormal   Collection Time: 01/08/21 11:51 AM  Result Value Ref Range   Glucose-Capillary 112 (H) 70 - 99 mg/dL  MRSA Next Gen by PCR, Nasal     Status: None   Collection Time: 01/08/21 12:18 PM  Specimen: Nasal Mucosa; Nasal Swab  Result Value Ref Range   MRSA by PCR Next Gen NOT DETECTED NOT DETECTED  Blood gas, arterial     Status: None   Collection Time: 01/09/21  3:11 AM  Result Value Ref Range   FIO2 0.21    Delivery systems VENTILATOR    Mode PRESSURE REGULATED VOLUME CONTROL    VT 450 mL   LHR 16 resp/min   Peep/cpap 5.0 cm H20   pH, Arterial 7.40 7.350 - 7.450   pCO2 arterial 42 32.0 - 48.0 mmHg   pO2, Arterial 85 83.0 - 108.0 mmHg   Bicarbonate 26.0 20.0 - 28.0 mmol/L   Acid-Base Excess 1.0 0.0 - 2.0 mmol/L   O2 Saturation 96.4 %   Patient temperature 37.0    Collection site RIGHT RADIAL    Sample type ARTERIAL DRAW    Allens test (pass/fail) PASS PASS   Mechanical Rate 16   CBC     Status: None   Collection Time: 01/09/21  5:42 AM  Result Value Ref Range   WBC 7.9 4.0 - 10.5 K/uL   RBC 4.39 4.22 - 5.81 MIL/uL   Hemoglobin 13.6 13.0 - 17.0 g/dL   HCT 34.7 42.5 - 95.6 %   MCV 92.9 80.0 - 100.0 fL   MCH 31.0 26.0 - 34.0 pg   MCHC 33.3 30.0 - 36.0 g/dL   RDW 38.7 56.4 - 33.2 %   Platelets 234 150 - 400 K/uL   nRBC 0.0 0.0 - 0.2 %  Basic metabolic panel     Status: Abnormal   Collection Time: 01/09/21  5:42 AM  Result Value Ref Range    Sodium 138 135 - 145 mmol/L   Potassium 4.2 3.5 - 5.1 mmol/L   Chloride 100 98 - 111 mmol/L   CO2 25 22 - 32 mmol/L   Glucose, Bld 161 (H) 70 - 99 mg/dL   BUN 28 (H) 8 - 23 mg/dL   Creatinine, Ser 9.51 0.61 - 1.24 mg/dL   Calcium 9.6 8.9 - 88.4 mg/dL   GFR, Estimated >16 >60 mL/min   Anion gap 13 5 - 15  Triglycerides     Status: None   Collection Time: 01/09/21  5:42 AM  Result Value Ref Range   Triglycerides 82 <150 mg/dL    SUBJECTIVE:  No acute events.  Required anxiolytic several times last night.  Tube advanced yesterday p.m. and improved leak.  Patient responds to questioning with turning head.  OBJECTIVE:  GEN-  supine in bed and sedated with nasotracheal tube in place and secured to forehead. OC/OP- improved but continued oral tongue edema.  Improved lfoor of mouth edema.  Still mallampati IV.  IMPRESSION:  Angioedema improved but still present  PLAN:  Discussed with wife and Dr. Belia Heman.  In ER the epiglottis was worsening as tongue was improving so so not an exact 1:1 correlation.  Given persistent tongue edema, recommend keeping tube in another day.  If continued improvement and good air leak, anticipate being able to extubate tomorrow.  Will continue to follow.  [name] 01/09/2021, 8:47 AM

## 2021-01-10 DIAGNOSIS — T783XXA Angioneurotic edema, initial encounter: Secondary | ICD-10-CM | POA: Diagnosis not present

## 2021-01-10 DIAGNOSIS — J9601 Acute respiratory failure with hypoxia: Secondary | ICD-10-CM | POA: Diagnosis not present

## 2021-01-10 LAB — GLUCOSE, CAPILLARY
Glucose-Capillary: 169 mg/dL — ABNORMAL HIGH (ref 70–99)
Glucose-Capillary: 182 mg/dL — ABNORMAL HIGH (ref 70–99)

## 2021-01-10 LAB — CBC
HCT: 38 % — ABNORMAL LOW (ref 39.0–52.0)
Hemoglobin: 13.2 g/dL (ref 13.0–17.0)
MCH: 31.8 pg (ref 26.0–34.0)
MCHC: 34.7 g/dL (ref 30.0–36.0)
MCV: 91.6 fL (ref 80.0–100.0)
Platelets: 235 10*3/uL (ref 150–400)
RBC: 4.15 MIL/uL — ABNORMAL LOW (ref 4.22–5.81)
RDW: 12.7 % (ref 11.5–15.5)
WBC: 12.7 10*3/uL — ABNORMAL HIGH (ref 4.0–10.5)
nRBC: 0 % (ref 0.0–0.2)

## 2021-01-10 LAB — BASIC METABOLIC PANEL WITH GFR
Anion gap: 11 (ref 5–15)
BUN: 38 mg/dL — ABNORMAL HIGH (ref 8–23)
CO2: 26 mmol/L (ref 22–32)
Calcium: 9.5 mg/dL (ref 8.9–10.3)
Chloride: 101 mmol/L (ref 98–111)
Creatinine, Ser: 0.81 mg/dL (ref 0.61–1.24)
GFR, Estimated: >60 mL/min
Glucose, Bld: 182 mg/dL — ABNORMAL HIGH (ref 70–99)
Potassium: 4 mmol/L (ref 3.5–5.1)
Sodium: 138 mmol/L (ref 135–145)

## 2021-01-10 LAB — MAGNESIUM: Magnesium: 1.7 mg/dL (ref 1.7–2.4)

## 2021-01-10 LAB — PHOSPHORUS: Phosphorus: 4.2 mg/dL (ref 2.5–4.6)

## 2021-01-10 MED ORDER — CHLORHEXIDINE GLUCONATE 0.12 % MT SOLN
15.0000 mL | Freq: Two times a day (BID) | OROMUCOSAL | Status: DC
  Administered 2021-01-10: 15 mL via OROMUCOSAL
  Filled 2021-01-10: qty 15

## 2021-01-10 MED ORDER — CHLORHEXIDINE GLUCONATE CLOTH 2 % EX PADS
6.0000 | MEDICATED_PAD | Freq: Every day | CUTANEOUS | Status: DC
  Administered 2021-01-10: 6 via TOPICAL

## 2021-01-10 MED ORDER — CHLORHEXIDINE GLUCONATE 0.12% ORAL RINSE (MEDLINE KIT)
15.0000 mL | Freq: Two times a day (BID) | OROMUCOSAL | Status: DC
  Administered 2021-01-10: 15 mL via OROMUCOSAL

## 2021-01-10 MED ORDER — ORAL CARE MOUTH RINSE: 15.0000 mL | OROMUCOSAL | Status: DC

## 2021-01-10 MED ORDER — MAGNESIUM SULFATE 2 GM/50ML IV SOLN
2.0000 g | Freq: Once | INTRAVENOUS | Status: AC
  Administered 2021-01-10: 2 g via INTRAVENOUS
  Filled 2021-01-10: qty 50

## 2021-01-10 NOTE — Progress Notes (Signed)
Roy [name] was extubated to 2L via  at 45.  He is able to talk and breath on his own.  He is resting comfortably with his wife at bedside.  He was encouraged to not talk so that his vocal cords can rest.  O2 sat=98%, RR=16, HR=72.

## 2021-01-10 NOTE — Progress Notes (Signed)
NAME:  Roy [name]., MRN:  [id number], DOB:  [birth year], LOS: 2 ADMISSION DATE:  01/08/2021 CONSULTATION DATE:  01/08/2021 REFERRING MD:  Sharman Cheek CHIEF COMPLAINT:  Allergic reaction  75 yo male presented to ER on 01/08/21 with allergic reaction, extremely edematous tongue and oral airway suspected Lisinopril angioedema, s/p nasotracheal intubation by ENT and admitted to the ICU 01/08/21 for vent mgmt and hemodynamic support on sedation.  Pertinent  Medical History   Past Medical History:  Diagnosis Date   Bedbound    Bilateral shoulder pain    CHF (congestive heart failure) (HCC)    Coronary artery disease    Diabetes mellitus without complication (HCC)    GERD (gastroesophageal reflux disease)    Glaucoma    HLD (hyperlipidemia)    Hypertension    Lumbar stenosis    Non-ischemic cardiomyopathy (HCC)    PVC (premature ventricular contraction)    Sleep apnea    H/O NO CPAP IN 18 YRS   Significant Hospital Events: Including procedures, antibiotic start and stop dates in addition to other pertinent events   9/29 Presented to ER via EMS with extreme tongue edema, oral secretions and bleeding. Suspected Lisinopril reaction 9/29 Intubated by ENT with nasotracheal tube for airway protection after noted with tongue extremely edematous and protruding from mouth with abrasions under the tongue from teeth scraping, fullness of oral floor and submandibular space, and difficulty speaking, mild respiratory distress. 9/29 Admitted to ICU for vent mgmt, hemodynamic monitoring on sedation 9/30 Remains intubated, sedated on propofol, arousable and following simple commands. Tongue/oral swelling improved as compared to yesterday but not resolved. CXR today stable with nasotracheal tube not well seen, remains high ? At thoracic inlet on report 10/1 plan for trial of extubation  Micro Data:  9/29: Respiratory viral COVID PCR + Influenza A/B: Negative  Antimicrobials:  None  Interim  History / Subjective:  Remains on vent Swelling much improved Plan for extubation Re-intubation if needed  Objective   Blood pressure 125/76, pulse 67, temperature (!) 97.2 F (36.2 C), temperature source Axillary, resp. rate 18, height 6' 0.99" (1.854 m), weight 104.9 kg, SpO2 97 %.    Vent Mode: PRVC FiO2 (%):  [21 %-35 %] 21 % Set Rate:  [16 bmp] 16 bmp Vt Set:  [450 mL] 450 mL PEEP:  [5 cmH20] 5 cmH20 Plateau Pressure:  [12 cmH20-13 cmH20] 13 cmH20   Intake/Output Summary (Last 24 hours) at 01/10/2021 0758 Last data filed at 01/10/2021 0600 Gross per 24 hour  Intake 922.39 ml  Output 750 ml  Net 172.39 ml    Filed Weights   01/08/21 0941 01/09/21 0458 01/10/21 0435  Weight: 110 kg 107.1 kg 104.9 kg   REVIEW OF SYSTEMS  PATIENT IS UNABLE TO PROVIDE COMPLETE REVIEW OF SYSTEMS DUE TO SEVERE CRITICAL ILLNESS AND TOXIC METABOLIC ENCEPHALOPATHY  PHYSICAL EXAMINATION:  GENERAL:critically ill appearing,  EYES: Pupils equal, round, reactive to light.  No scleral icterus.  MOUTH: Moist mucosal membrane. Nasally INTUBATED Swelling much improved NECK: Supple.  PULMONARY: +rhonchi,  CARDIOVASCULAR: S1 and S2.  No murmurs  GASTROINTESTINAL: Soft, nontender, -distended. Positive bowel sounds.  MUSCULOSKELETAL: No swelling, clubbing, or edema.  NEUROLOGIC: arousable  SKIN:intact,warm,dry     Labs/imaging that I havepersonally reviewed  (right click and "Reselect all SmartList Selections" daily)  ER workup includes: Labs CBC unremarkable, CMP bicarb 34 otherwise unremarkable, COVID PCR and Influenza A/B screen negative, and CBG 112.  9/29: CXR: obtained post intubation, NT tube at  level of clavicles, torturous thoracic aorta, mild cardiomegaly, no acute cardiopulmonary process.  9/30 CXR: cardiopulm status stable no acute process. NT tube not well visualized, remains possibly at level of thoracic inlet - clavicles  Resolved Hospital Problem list      ASSESSMENT AND  PLAN  75 yo male presented to ER on 01/08/21 with allergic reaction, extremely edematous tongue and oral airway  Lisinopril angioedema, s/p nasotracheal intubation by ENT and admitted to the ICU 01/08/21 for vent mgmt and hemodynamic support on sedation.   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 S/P anterior cervical spinal fusion C3-C4 Oct 2021, posterior cervical spinal fusion Jan 2022 Cervical myelopathy (HCC) Wheelchair/bed bound status - Chronic indwelling foley catheter - Chronic pain management with baclofen, oxycodone - Added Robaxin 500mg  IV q8h; restart home po meds once extubated and cleared for oral intake  ELECTROLYTES -follow labs as needed -replace as needed -pharmacy consultation and following   Best practice (right click and "Reselect all SmartList Selections" daily)  Diet: NPO Pain/Anxiety/Delirium protocol (if indicated): Yes (RASS goal -1,-2) VAP protocol (if indicated): Yes DVT prophylaxis: Subcutaneous Heparin GI prophylaxis: H2B Glucose control:  SSI Yes Central venous access:  N/A Arterial line:  N/A Foley:  Yes, and it is still needed - chronic foley cath last changed this week per wife Mobility:  bed rest  Code Status:  DNR Disposition:ICU  Labs   CBC: Recent Labs  Lab 01/08/21 0940 01/09/21 0542 01/10/21 0410  WBC 5.3 7.9 12.7*  NEUTROABS 2.6  --   --   HGB 13.7 13.6 13.2  HCT 40.6 40.8 38.0*  MCV 94.0 92.9  91.6  PLT 210 234 235     Basic Metabolic Panel: Recent Labs  Lab 01/08/21 0940 01/09/21 0542 01/10/21 0410  NA 140 138 138  K 4.0 4.2 4.0  CL 100 100 101  CO2 34* 25 26  GLUCOSE 84 161* 182*  BUN 17 28* 38*  CREATININE 0.60* 0.89 0.81  CALCIUM 9.9 9.6 9.5  MG  --   --  1.7  PHOS  --   --  4.2    GFR: Estimated Creatinine Clearance: 100.2 mL/min (by C-G formula based on SCr of 0.81 mg/dL). Recent Labs  Lab 01/08/21 0940 01/09/21 0542 01/10/21 0410  WBC 5.3 7.9 12.7*     Liver Function Tests: Recent Labs  Lab 01/08/21 0940  AST 12*  ALT 17  ALKPHOS 91  BILITOT 0.8  PROT 6.8  ALBUMIN 3.5       DVT/GI PRX  assessed I Assessed the need for Labs I Assessed the need for Foley I Assessed the need for Central Venous Line Family Discussion when available I Assessed the need for Mobilization I made an Assessment of medications to be adjusted accordingly Safety Risk assessment completed  CASE DISCUSSED IN MULTIDISCIPLINARY ROUNDS WITH ICU TEAM     Critical Care Time devoted to patient care services described in this note is 35 minutes.  Lucie Leather, M.D.  [facility]  Medical Director [facility] Medical Director [facility] Cardio-Pulmonary Department

## 2021-01-10 NOTE — Progress Notes (Signed)
Pt VSS. Tolerating rm air. Able to follow commands. Pt is at his baseline for neuro status and physical activity. Per pt wife R radial pulse has always been decreased. 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 Pt is able to tolerate sips of water and small bites of applesauce this shift. Tongue edema continues to decrease.

## 2021-01-11 DIAGNOSIS — E119 Type 2 diabetes mellitus without complications: Secondary | ICD-10-CM

## 2021-01-11 DIAGNOSIS — Z794 Long term (current) use of insulin: Secondary | ICD-10-CM

## 2021-01-11 DIAGNOSIS — I10 Essential (primary) hypertension: Secondary | ICD-10-CM

## 2021-01-11 DIAGNOSIS — I5032 Chronic diastolic (congestive) heart failure: Secondary | ICD-10-CM

## 2021-01-11 DIAGNOSIS — M4800 Spinal stenosis, site unspecified: Secondary | ICD-10-CM

## 2021-01-11 DIAGNOSIS — E669 Obesity, unspecified: Secondary | ICD-10-CM

## 2021-01-11 DIAGNOSIS — T783XXA Angioneurotic edema, initial encounter: Secondary | ICD-10-CM

## 2021-01-11 LAB — CBC
HCT: 40 % (ref 39.0–52.0)
Hemoglobin: 13.8 g/dL (ref 13.0–17.0)
MCH: 31.4 pg (ref 26.0–34.0)
MCHC: 34.5 g/dL (ref 30.0–36.0)
MCV: 90.9 fL (ref 80.0–100.0)
Platelets: 239 10*3/uL (ref 150–400)
RBC: 4.4 MIL/uL (ref 4.22–5.81)
RDW: 12.7 % (ref 11.5–15.5)
WBC: 9.4 10*3/uL (ref 4.0–10.5)
nRBC: 0 % (ref 0.0–0.2)

## 2021-01-11 LAB — BASIC METABOLIC PANEL
Anion gap: 5 (ref 5–15)
BUN: 34 mg/dL — ABNORMAL HIGH (ref 8–23)
CO2: 29 mmol/L (ref 22–32)
Calcium: 9.5 mg/dL (ref 8.9–10.3)
Chloride: 106 mmol/L (ref 98–111)
Creatinine, Ser: 0.74 mg/dL (ref 0.61–1.24)
GFR, Estimated: >60 mL/min (ref >60)
Glucose, Bld: 178 mg/dL — ABNORMAL HIGH (ref 70–99)
Potassium: 3.9 mmol/L (ref 3.5–5.1)
Sodium: 140 mmol/L (ref 135–145)

## 2021-01-11 LAB — GLUCOSE, CAPILLARY
Glucose-Capillary: 186 mg/dL — ABNORMAL HIGH (ref 70–99)
Glucose-Capillary: 156 mg/dL — ABNORMAL HIGH (ref 70–99)

## 2021-01-11 LAB — PHOSPHORUS: Phosphorus: 2.2 mg/dL — ABNORMAL LOW (ref 2.5–4.6)

## 2021-01-11 LAB — MAGNESIUM: Magnesium: 1.9 mg/dL (ref 1.7–2.4)

## 2021-01-11 MED ORDER — PREDNISONE 10 MG PO TABS: ORAL_TABLET | ORAL | 0 refills | Status: DC

## 2021-01-11 MED ORDER — PREDNISONE 10 MG PO TABS
30.0000 mg | ORAL_TABLET | Freq: Every day | ORAL | Status: DC
  Administered 2021-01-11: 30 mg via ORAL
  Filled 2021-01-11: qty 3

## 2021-01-11 MED ORDER — LEVEMIR FLEXTOUCH 100 UNIT/ML ~~LOC~~ SOPN: 12.0000 [IU] | PEN_INJECTOR | Freq: Every day | SUBCUTANEOUS | Status: DC

## 2021-01-11 MED ORDER — DIPHENHYDRAMINE HCL 50 MG/ML IJ SOLN: 25.0000 mg | Freq: Three times a day (TID) | INTRAMUSCULAR | Status: DC | PRN

## 2021-01-11 NOTE — Progress Notes (Signed)
Patient alert and oriented x4, in Normal Sinus Rhythm. Patient stable. Medication and discharge instructions provided to patient and spouse at bedside.

## 2021-01-11 NOTE — Discharge Summary (Signed)
[facility] [place] [facility]   PATIENT NAME: Roy [name]    MR#:  [id number]  DATE OF BIRTH:  12-06-1945  DATE OF ADMISSION:  01/08/2021 ADMITTING PHYSICIAN: No admitting provider for patient encounter.  DATE OF DISCHARGE: 01/11/2021 10:49 AM  PRIMARY CARE PHYSICIAN: Rigoberto Noel, MD    ADMISSION DIAGNOSIS:  Respiratory failure (HCC) [J96.90] Angioedema, initial encounter [T78.3XXA]  DISCHARGE DIAGNOSIS:  Active Problems:   Respiratory failure (HCC)   SECONDARY DIAGNOSIS:   Past Medical History:  Diagnosis Date  . Bedbound   . Bilateral shoulder pain   . CHF (congestive heart failure) (HCC)   . Coronary artery disease   . Diabetes mellitus without complication (HCC)   . GERD (gastroesophageal reflux disease)   . Glaucoma   . HLD (hyperlipidemia)   . Hypertension   . Lumbar stenosis   . Non-ischemic cardiomyopathy (HCC)   . PVC (premature ventricular contraction)   . Sleep apnea    H/O NO CPAP IN 18 YRS    HOSPITAL COURSE:   1.  Severe angioedema which required nasal intubation by ENT.  Acute hypoxic respiratory failure.  Initially on the critical care service.  Patient was successfully extubated.  Patient was started on Decadron, Benadryl and Pepcid.  When I saw him on 01/11/2021 patient did not have any tongue swelling.  We will give a prednisone taper upon going home.  Patient already on Pepcid at home.  Change Benadryl to as needed.  Lisinopril listed as a drug allergy with angioedema.  The patient should not be prescribed any ACE inhibitor.  Acute hypoxic respiratory failure has resolved.  Patient on room air. 2.  Essential hypertension can go back on Norvasc and Coreg as outpatient 3.  Type 2 diabetes mellitus.  Patient go back on his insulin at home.  Sugars may be higher being on steroid taper.  Patient also on metformin. 4.  Spinal stenosis cervical and lumbar region.  Wheelchair and bedbound.  Chronic indwelling Foley which was  changed last week. 5.  Chronic diastolic congestive heart failure.  Continue Coreg.  Lisinopril contraindicated at this point with angioedema. 6.  Obesity with a BMI of 30.52, history of sleep apnea  DISCHARGE CONDITIONS:   Satisfactory  CONSULTS OBTAINED:  ENT Critical care specialist  DRUG ALLERGIES:   Allergies  Allergen Reactions  . Ace Inhibitors Other (See Comments)    Angioedema    DISCHARGE MEDICATIONS:   Allergies as of 01/11/2021       Reactions   Ace Inhibitors Other (See Comments)   Angioedema        Medication List     STOP taking these medications    guaiFENesin-dextromethorphan 100-10 MG/5ML syrup Commonly known as: ROBITUSSIN DM   lisinopril 40 MG tablet Commonly known as: ZESTRIL       TAKE these medications    acetaminophen 500 MG tablet Commonly known as: TYLENOL Take 2 tablets (1,000 mg total) by mouth every 6 (six) hours.   amLODipine 10 MG tablet Commonly known as: NORVASC Take 10 mg by mouth daily.   aspirin 81 MG chewable tablet Chew 81 mg by mouth daily.   baclofen 10 MG tablet Commonly known as: LIORESAL Take 5 mg by mouth 3 (three) times daily.   brimonidine 0.2 % ophthalmic solution Commonly known as: ALPHAGAN Place 1 drop into the right eye in the morning and at bedtime.   carvedilol 6.25 MG tablet Commonly known as: COREG Take 6.25 mg by mouth  2 (two) times daily. What changed: Another medication with the same name was removed. Continue taking this medication, and follow the directions you see here.   CENTRUM SILVER 50+MEN PO Take 1 tablet by mouth daily.   cyanocobalamin 2000 MCG tablet Take 2,000 mcg by mouth daily.   famotidine 20 MG tablet Commonly known as: PEPCID Take 20 mg by mouth every evening.   gabapentin 300 MG capsule Commonly known as: NEURONTIN Take 300 mg by mouth at bedtime.   insulin aspart 100 UNIT/ML injection Commonly known as: novoLOG Inject 4-8 Units into the skin 3 (three) times  daily with meals. Sliding scale   latanoprost 0.005 % ophthalmic solution Commonly known as: XALATAN Place 1 drop into the right eye at bedtime.   Levemir FlexTouch 100 UNIT/ML FlexPen Generic drug: insulin detemir Inject 12 Units into the skin at bedtime.   meloxicam 7.5 MG tablet Commonly known as: MOBIC Take 1 tablet by mouth daily.   metFORMIN 500 MG tablet Commonly known as: GLUCOPHAGE Take 500 mg by mouth 2 (two) times daily with a meal.   polyethylene glycol powder 17 GM/SCOOP powder Commonly known as: GLYCOLAX/MIRALAX Take 0.5 Containers by mouth daily as needed for moderate constipation.   predniSONE 10 MG tablet Commonly known as: DELTASONE 3 tabs po daily day1; 2 tabs po daily day 2,3; 1 tab po day 4,5; 1/2 tab po daily day6,7 Start taking on: January 12, 2021   simvastatin 20 MG tablet Commonly known as: ZOCOR Take 20 mg by mouth daily at 6 PM.   tamsulosin 0.4 MG Caps capsule Commonly known as: FLOMAX Take 1 capsule (0.4 mg total) by mouth daily after supper.         DISCHARGE INSTRUCTIONS:   Follow-up PMD 5 days  If you experience worsening of your admission symptoms, develop shortness of breath, life threatening emergency, suicidal or homicidal thoughts you must seek medical attention immediately by calling 911 or calling your MD immediately  if symptoms less severe.  You Must read complete instructions/literature along with all the possible adverse reactions/side effects for all the Medicines you take and that have been prescribed to you. Take any new Medicines after you have completely understood and accept all the possible adverse reactions/side effects.   Please note  You were cared for by a hospitalist during your hospital stay. If you have any questions about your discharge medications or the care you received while you were in the hospital after you are discharged, you can call the unit and asked to speak with the hospitalist on call if the  hospitalist that took care of you is not available. Once you are discharged, your primary care physician will handle any further medical issues. Please note that NO REFILLS for any discharge medications will be authorized once you are discharged, as it is imperative that you return to your primary care physician (or establish a relationship with a primary care physician if you do not have one) for your aftercare needs so that they can reassess your need for medications and monitor your lab values.    Today   CHIEF COMPLAINT:   Chief Complaint  Patient presents with  . Allergic Reaction    HISTORY OF PRESENT ILLNESS:  Roy [name]  is a 75 y.o. male came in with allergic reaction and tongue swelling required nasal intubation.   VITAL SIGNS:  Blood pressure (!) 138/93, pulse 69, temperature 98.3 F (36.8 C), resp. rate 17, height 6' 0.99" (1.854 m), weight 104.9  kg, SpO2 96 %.  I/O:   Intake/Output Summary (Last 24 hours) at 01/11/2021 1606 Last data filed at 01/11/2021 1000 Gross per 24 hour  Intake 154 ml  Output 2110 ml  Net -1956 ml    PHYSICAL EXAMINATION:  GENERAL:  75 y.o.-year-old patient lying in the bed with no acute distress.  EYES: Pupils equal, round, reactive to light and accommodation. No scleral icterus.  Left eye deviated out HEENT: Head atraumatic, normocephalic. Oropharynx and nasopharynx clear.  No tongue swelling today. LUNGS: Normal breath sounds bilaterally, no wheezing, rales,rhonchi or crepitation. No use of accessory muscles of respiration.  CARDIOVASCULAR: S1, S2 normal. No murmurs, rubs, or gallops.  ABDOMEN: Soft, non-tender, non-distended  EXTREMITIES: Trace pedal edema.  PSYCHIATRIC: The patient is alert and oriented x 3.  SKIN: No obvious rash, lesion, or ulcer.   DATA REVIEW:   CBC Recent Labs  Lab 01/11/21 0558  WBC 9.4  HGB 13.8  HCT 40.0  PLT 239    Chemistries  Recent Labs  Lab 01/08/21 0940 01/09/21 0542 01/11/21 0558   NA 140   < > 140  K 4.0   < > 3.9  CL 100   < > 106  CO2 34*   < > 29  GLUCOSE 84   < > 178*  BUN 17   < > 34*  CREATININE 0.60*   < > 0.74  CALCIUM 9.9   < > 9.5  MG  --    < > 1.9  AST 12*  --   --   ALT 17  --   --   ALKPHOS 91  --   --   BILITOT 0.8  --   --    < > = values in this interval not displayed.     Microbiology Results  Results for orders placed or performed during the hospital encounter of 01/08/21  Resp Panel by RT-PCR (Flu A&B, Covid) Nasopharyngeal Swab     Status: None   Collection Time: 01/08/21 10:39 AM   Specimen: Nasopharyngeal Swab; Nasopharyngeal(NP) swabs in vial transport medium  Result Value Ref Range Status   SARS Coronavirus 2 by RT PCR NEGATIVE NEGATIVE Final    Comment: (NOTE) SARS-CoV-2 target nucleic acids are NOT DETECTED.  The SARS-CoV-2 RNA is generally detectable in upper respiratory specimens during the acute phase of infection. The lowest concentration of SARS-CoV-2 viral copies this assay can detect is 138 copies/mL. A negative result does not preclude SARS-Cov-2 infection and should not be used as the sole basis for treatment or other patient management decisions. A negative result may occur with  improper specimen collection/handling, submission of specimen other than nasopharyngeal swab, presence of viral mutation(s) within the areas targeted by this assay, and inadequate number of viral copies(<138 copies/mL). A negative result must be combined with clinical observations, patient history, and epidemiological information. The expected result is Negative.  Fact Sheet for Patients:  BloggerCourse.com  Fact Sheet for Healthcare Providers:  SeriousBroker.it  This test is no t yet approved or cleared by the [place] FDA and  has been authorized for detection and/or diagnosis of SARS-CoV-2 by FDA under an Emergency Use Authorization (EUA). This EUA will remain  in effect  (meaning this test can be used) for the duration of the COVID-19 declaration under Section 564(b)(1) of the Act, 21 U.S.C.section 360bbb-3(b)(1), unless the authorization is terminated  or revoked sooner.       Influenza A by PCR NEGATIVE NEGATIVE Final   Influenza  B by PCR NEGATIVE NEGATIVE Final    Comment: (NOTE) The Xpert Xpress SARS-CoV-2/FLU/RSV plus assay is intended as an aid in the diagnosis of influenza from Nasopharyngeal swab specimens and should not be used as a sole basis for treatment. Nasal washings and aspirates are unacceptable for Xpert Xpress SARS-CoV-2/FLU/RSV testing.  Fact Sheet for Patients: BloggerCourse.com  Fact Sheet for Healthcare Providers: SeriousBroker.it  This test is not yet approved or cleared by the [place] FDA and has been authorized for detection and/or diagnosis of SARS-CoV-2 by FDA under an Emergency Use Authorization (EUA). This EUA will remain in effect (meaning this test can be used) for the duration of the COVID-19 declaration under Section 564(b)(1) of the Act, 21 U.S.C. section 360bbb-3(b)(1), unless the authorization is terminated or revoked.  Performed at [facility], [address] [place], [place] [address]   MRSA Next Gen by PCR, Nasal     Status: None   Collection Time: 01/08/21 12:18 PM   Specimen: Nasal Mucosa; Nasal Swab  Result Value Ref Range Status   MRSA by PCR Next Gen NOT DETECTED NOT DETECTED Final    Comment: (NOTE) The GeneXpert MRSA Assay (FDA approved for NASAL specimens only), is one component of a comprehensive MRSA colonization surveillance program. It is not intended to diagnose MRSA infection nor to guide or monitor treatment for MRSA infections. Test performance is not FDA approved in patients less than 27 years old. Performed at [facility], [address] [place], [place] [address]       Management plans discussed  with the patient, family and they are in agreement.  CODE STATUS:  Code Status History     Date Active Date Inactive Code Status Order ID Comments User Context   01/08/2021 1352 01/11/2021 1555 DNR [id number]  Erin Fulling, MD Inpatient   01/08/2021 1035 01/08/2021 1352 Full Code [id number]  Erin Fulling, MD ED   08/24/2020 2338 08/27/2020 2330 Full Code [id number]  Andris Baumann, MD ED   05/05/2020 1649 05/08/2020 1711 Full Code [id number]  [name], MD Inpatient   01/29/2020 1925 02/15/2020 2145 Full Code [id number]  Patsey Berthold, NP Inpatient   01/14/2020 2140 01/25/2020 2001 Full Code [id number]  Hugelmeyer, Jon Gills, DO ED      Questions for Most Recent Historical Code Status (Order [id number])     Question Answer   In the event of cardiac or respiratory ARREST Do not call a "code blue"   In the event of cardiac or respiratory ARREST Do not perform Intubation, CPR, defibrillation or ACLS   In the event of cardiac or respiratory ARREST Use medication by any route, position, wound care, and other measures to relive pain and suffering. May use oxygen, suction and manual treatment of airway obstruction as needed for comfort.       TOTAL TIME TAKING CARE OF THIS PATIENT: 32 minutes.    Alford Highland M.D on 01/11/2021 at 4:06 PM  [facility]  CC: Primary care physician; Rigoberto Noel, MD

## 2021-01-11 NOTE — TOC Initial Note (Signed)
Transition of Care (TOC) - Initial/Assessment Note    Patient Details  Name: Roy [name]. MRN: [id number] Date of Birth: 1945-11-24  Transition of Care [facility]) CM/SW Contact:    Marina Goodell Phone Number: [phone] 01/11/2021, 9:52 AM  Clinical Narrative:                  Patient presents to [facility] due to allergic reaction to medication.  Patient's spouse  Ermias, Tomeo [phone] [facility]) is the main contact and care giver.  Patient is bed bound and needs extensive assistance w/ most ADLs.  Patient will discharge home, no TOC needs at this time.  Attending and ICU RN updated on [facility] transportation request.  Transportation paperwork placed in folder.  DNR form requested for transportation.  Expected Discharge Plan: Home/Self Care Barriers to Discharge: No Barriers Identified   Patient Goals and CMS Choice        Expected Discharge Plan and Services Expected Discharge Plan: Home/Self Care In-house Referral: Clinical Social Work   Post Acute Care Choice: NA Living arrangements for the past 2 months: Single Family Home Expected Discharge Date: 01/11/21                                    Prior Living Arrangements/Services Living arrangements for the past 2 months: Single Family Home Lives with:: Spouse Patient language and need for interpreter reviewed:: Yes Do you feel safe going back to the place where you live?: Yes      Need for Family Participation in Patient Care: Yes (Comment) Care giver support system in place?: Yes (comment) Current home services: Other (comment) (private duty care giver) Criminal Activity/Legal Involvement Pertinent to Current Situation/Hospitalization: No - Comment as needed  Activities of Daily Living Home Assistive Devices/Equipment: Wheelchair ADL Screening (condition at time of admission) Patient's cognitive ability adequate to safely complete daily activities?: No Is the patient deaf or have difficulty hearing?:  No Does the patient have difficulty seeing, even when wearing glasses/contacts?: No Does the patient have difficulty concentrating, remembering, or making decisions?: No Patient able to express need for assistance with ADLs?: No Does the patient have difficulty dressing or bathing?: Yes Independently performs ADLs?: No Does the patient have difficulty walking or climbing stairs?: Yes Weakness of Legs: Both Weakness of Arms/Hands: Left  Permission Sought/Granted Permission sought to share information with : Facility Medical sales representative    Share Information with NAME: Mattias, Walmsley (Spouse)   [phone] (Mobile)           Emotional Assessment Appearance:: Appears stated age Attitude/Demeanor/Rapport: Engaged Affect (typically observed): Stable Orientation: : Oriented to Situation, Oriented to  Time, Oriented to Place, Oriented to Self Alcohol / Substance Use: Not Applicable Psych Involvement: No (comment)  Admission diagnosis:  Respiratory failure (HCC) [J96.90] Angioedema, initial encounter [T78.3XXA] Patient Active Problem List   Diagnosis Date Noted   Respiratory failure (HCC) 01/08/2021   Chronic indwelling Foley catheter 08/24/2020   Wheelchair bound 08/24/2020   Complicated UTI (urinary tract infection) 08/24/2020   COVID-19 virus infection 08/24/2020   Acute respiratory failure with hypoxia (HCC) 08/24/2020   NICM (nonischemic cardiomyopathy) (HCC) 08/24/2020   CAD (coronary artery disease) 08/24/2020   Type 2 diabetes mellitus with hyperlipidemia (HCC) 08/24/2020   Cervical myelopathy (HCC) 05/05/2020   Post-operative state 01/31/2020   Hypertension    Diabetes mellitus without complication (HCC)    HLD (hyperlipidemia)    Chronic diastolic  CHF (congestive heart failure) (HCC)    S/P cervical spinal fusion 01/29/2020   Weakness    Spinal stenosis of lumbar region    History of progressive weakness    Claudication (HCC)    Lower extremity weakness 01/14/2020    PCP:  Rigoberto Noel, MD Pharmacy:   [facility] [address] [place] - [address] [place] [address] Phone: [phone] Fax: [phone]     Social Determinants of Health (SDOH) Interventions    Readmission Risk Interventions No flowsheet data found.

## 2021-01-27 ENCOUNTER — Telehealth: Payer: Self-pay | Admitting: Urology

## 2021-02-03 ENCOUNTER — Ambulatory Visit (INDEPENDENT_AMBULATORY_CARE_PROVIDER_SITE_OTHER): Payer: Medicare HMO | Admitting: Urology

## 2021-02-03 ENCOUNTER — Other Ambulatory Visit: Payer: Self-pay

## 2021-02-03 DIAGNOSIS — N319 Neuromuscular dysfunction of bladder, unspecified: Secondary | ICD-10-CM | POA: Diagnosis not present

## 2021-02-03 NOTE — Progress Notes (Signed)
Virtual Visit via Telephone Note  I connected with Roy [name]. and his wife on 02/03/21 at  8:30 AM EDT by telephone and verified that I am speaking with the correct person using two identifiers.   Patient location: Home Provider location: [facility]   I discussed the limitations, risks, security and privacy concerns of performing an evaluation and management service by telephone and the availability of in person appointments. We discussed the impact of the COVID-19 pandemic on the healthcare system, and the importance of social distancing and reducing patient and provider exposure. I also discussed with the patient that there may be a patient responsible charge related to this service. The patient expressed understanding and agreed to proceed.  Reason for visit: Urinary retention  History of Present Illness: 75 year old complex male who was previously followed by Dr. Mena Goes.  He has been bedbound after a cervical spinal fusion and has had an indwelling Foley catheter since October 2021.  He failed a voiding trial around that time.  He remains severely debilitated and is working with physical therapy, and still is not mobile.  His catheter is being changed monthly by home health.  He was admitted in May for COVID and a possible UTI, though unclear if this was just asymptomatic bacteriuria and fever was secondary to COVID.  No gross hematuria or problems with the catheter.  He was recently admitted earlier this month for severe angioedema secondary to ACE inhibitor.  He remains very debilitated and bedbound and they would like to re-discuss consideration of voiding trial urodynamics in 3 months.    Follow Up: 20-month virtual, reconsider voiding trial versus urodynamics at that time   I discussed the assessment and treatment plan with the patient. The patient was provided an opportunity to ask questions and all were answered. The patient agreed with the plan and  demonstrated an understanding of the instructions.   The patient was advised to call back or seek an in-person evaluation if the symptoms worsen or if the condition fails to improve as anticipated.  I provided 7 minutes of non-face-to-face time during this encounter.   Sondra Come, MD

## 2021-03-07 ENCOUNTER — Encounter: Payer: Self-pay | Admitting: Emergency Medicine

## 2021-03-07 ENCOUNTER — Inpatient Hospital Stay
Admission: EM | Admit: 2021-03-07 | Discharge: 2021-03-13 | DRG: 177 | Disposition: A | Discharge status: Other Acute Inpatient Hospital | Payer: Medicare HMO | Attending: Internal Medicine | Admitting: Internal Medicine

## 2021-03-07 ENCOUNTER — Other Ambulatory Visit: Payer: Self-pay

## 2021-03-07 ENCOUNTER — Emergency Department: Payer: Medicare HMO

## 2021-03-07 DIAGNOSIS — Z7401 Bed confinement status: Secondary | ICD-10-CM | POA: Diagnosis not present

## 2021-03-07 DIAGNOSIS — I5032 Chronic diastolic (congestive) heart failure: Secondary | ICD-10-CM | POA: Diagnosis present

## 2021-03-07 DIAGNOSIS — J9601 Acute respiratory failure with hypoxia: Secondary | ICD-10-CM | POA: Diagnosis present

## 2021-03-07 DIAGNOSIS — I110 Hypertensive heart disease with heart failure: Secondary | ICD-10-CM | POA: Diagnosis present

## 2021-03-07 DIAGNOSIS — I959 Hypotension, unspecified: Secondary | ICD-10-CM | POA: Diagnosis present

## 2021-03-07 DIAGNOSIS — E669 Obesity, unspecified: Secondary | ICD-10-CM | POA: Diagnosis present

## 2021-03-07 DIAGNOSIS — G4733 Obstructive sleep apnea (adult) (pediatric): Secondary | ICD-10-CM | POA: Diagnosis present

## 2021-03-07 DIAGNOSIS — R339 Retention of urine, unspecified: Secondary | ICD-10-CM | POA: Diagnosis present

## 2021-03-07 DIAGNOSIS — J9811 Atelectasis: Secondary | ICD-10-CM | POA: Diagnosis present

## 2021-03-07 DIAGNOSIS — Z7984 Long term (current) use of oral hypoglycemic drugs: Secondary | ICD-10-CM

## 2021-03-07 DIAGNOSIS — Z6831 Body mass index (BMI) 31.0-31.9, adult: Secondary | ICD-10-CM

## 2021-03-07 DIAGNOSIS — I428 Other cardiomyopathies: Secondary | ICD-10-CM | POA: Diagnosis present

## 2021-03-07 DIAGNOSIS — J189 Pneumonia, unspecified organism: Secondary | ICD-10-CM | POA: Diagnosis present

## 2021-03-07 DIAGNOSIS — R0600 Dyspnea, unspecified: Secondary | ICD-10-CM

## 2021-03-07 DIAGNOSIS — Z888 Allergy status to other drugs, medicaments and biological substances status: Secondary | ICD-10-CM | POA: Diagnosis not present

## 2021-03-07 DIAGNOSIS — J869 Pyothorax without fistula: Principal | ICD-10-CM | POA: Diagnosis present

## 2021-03-07 DIAGNOSIS — Z794 Long term (current) use of insulin: Secondary | ICD-10-CM

## 2021-03-07 DIAGNOSIS — Z66 Do not resuscitate: Secondary | ICD-10-CM | POA: Diagnosis present

## 2021-03-07 DIAGNOSIS — E440 Moderate protein-calorie malnutrition: Secondary | ICD-10-CM | POA: Insufficient documentation

## 2021-03-07 DIAGNOSIS — Z8701 Personal history of pneumonia (recurrent): Secondary | ICD-10-CM

## 2021-03-07 DIAGNOSIS — Z791 Long term (current) use of non-steroidal anti-inflammatories (NSAID): Secondary | ICD-10-CM

## 2021-03-07 DIAGNOSIS — Z20822 Contact with and (suspected) exposure to covid-19: Secondary | ICD-10-CM | POA: Diagnosis present

## 2021-03-07 DIAGNOSIS — J90 Pleural effusion, not elsewhere classified: Secondary | ICD-10-CM | POA: Diagnosis present

## 2021-03-07 DIAGNOSIS — E119 Type 2 diabetes mellitus without complications: Secondary | ICD-10-CM | POA: Diagnosis present

## 2021-03-07 DIAGNOSIS — I2510 Atherosclerotic heart disease of native coronary artery without angina pectoris: Secondary | ICD-10-CM | POA: Diagnosis present

## 2021-03-07 DIAGNOSIS — G8220 Paraplegia, unspecified: Secondary | ICD-10-CM | POA: Diagnosis present

## 2021-03-07 DIAGNOSIS — E871 Hypo-osmolality and hyponatremia: Secondary | ICD-10-CM | POA: Diagnosis present

## 2021-03-07 DIAGNOSIS — K219 Gastro-esophageal reflux disease without esophagitis: Secondary | ICD-10-CM | POA: Diagnosis present

## 2021-03-07 DIAGNOSIS — Z7982 Long term (current) use of aspirin: Secondary | ICD-10-CM

## 2021-03-07 DIAGNOSIS — Z8616 Personal history of COVID-19: Secondary | ICD-10-CM | POA: Diagnosis not present

## 2021-03-07 DIAGNOSIS — Z981 Arthrodesis status: Secondary | ICD-10-CM

## 2021-03-07 DIAGNOSIS — E785 Hyperlipidemia, unspecified: Secondary | ICD-10-CM | POA: Diagnosis present

## 2021-03-07 DIAGNOSIS — Z79899 Other long term (current) drug therapy: Secondary | ICD-10-CM

## 2021-03-07 DIAGNOSIS — Z96651 Presence of right artificial knee joint: Secondary | ICD-10-CM | POA: Diagnosis present

## 2021-03-07 DIAGNOSIS — H409 Unspecified glaucoma: Secondary | ICD-10-CM | POA: Diagnosis present

## 2021-03-07 DIAGNOSIS — E860 Dehydration: Secondary | ICD-10-CM | POA: Diagnosis present

## 2021-03-07 LAB — BASIC METABOLIC PANEL
Anion gap: 8 (ref 5–15)
BUN: 34 mg/dL — ABNORMAL HIGH (ref 8–23)
CO2: 28 mmol/L (ref 22–32)
Calcium: 8.8 mg/dL — ABNORMAL LOW (ref 8.9–10.3)
Chloride: 89 mmol/L — ABNORMAL LOW (ref 98–111)
Creatinine, Ser: 0.96 mg/dL (ref 0.61–1.24)
GFR, Estimated: >60 mL/min (ref >60)
Glucose, Bld: 74 mg/dL (ref 70–99)
Potassium: 4.5 mmol/L (ref 3.5–5.1)
Sodium: 125 mmol/L — ABNORMAL LOW (ref 135–145)

## 2021-03-07 LAB — URINALYSIS, ROUTINE W REFLEX MICROSCOPIC
Glucose, UA: NEGATIVE mg/dL
Hgb urine dipstick: NEGATIVE
Ketones, ur: NEGATIVE mg/dL
Nitrite: POSITIVE — AB
Protein, ur: 100 mg/dL — AB
Specific Gravity, Urine: >1.03 — ABNORMAL HIGH (ref 1.005–1.030)
pH: 5.5 (ref 5.0–8.0)

## 2021-03-07 LAB — CBC
HCT: 32.6 % — ABNORMAL LOW (ref 39.0–52.0)
Hemoglobin: 10.8 g/dL — ABNORMAL LOW (ref 13.0–17.0)
MCH: 30.9 pg (ref 26.0–34.0)
MCHC: 33.1 g/dL (ref 30.0–36.0)
MCV: 93.4 fL (ref 80.0–100.0)
Platelets: 358 10*3/uL (ref 150–400)
RBC: 3.49 MIL/uL — ABNORMAL LOW (ref 4.22–5.81)
RDW: 13 % (ref 11.5–15.5)
WBC: 32.9 10*3/uL — ABNORMAL HIGH (ref 4.0–10.5)
nRBC: 0 % (ref 0.0–0.2)

## 2021-03-07 LAB — RESP PANEL BY RT-PCR (FLU A&B, COVID) ARPGX2
Influenza A by PCR: NEGATIVE
Influenza B by PCR: NEGATIVE
SARS Coronavirus 2 by RT PCR: NEGATIVE

## 2021-03-07 LAB — BRAIN NATRIURETIC PEPTIDE: B Natriuretic Peptide: 485 pg/mL — ABNORMAL HIGH (ref 0.0–100.0)

## 2021-03-07 LAB — CBG MONITORING, ED
Glucose-Capillary: 78 mg/dL (ref 70–99)
Glucose-Capillary: 63 mg/dL — ABNORMAL LOW (ref 70–99)

## 2021-03-07 IMAGING — CR DG CHEST 2V
2 series · 2 of 2 positions shown · non-contrast
Comparison: [DATE]

CLINICAL DATA: Hypotension, shortness of breath, weakness,
angioedema, bundle branch block

EXAM:
CHEST - 2 VIEW

[chest lat]
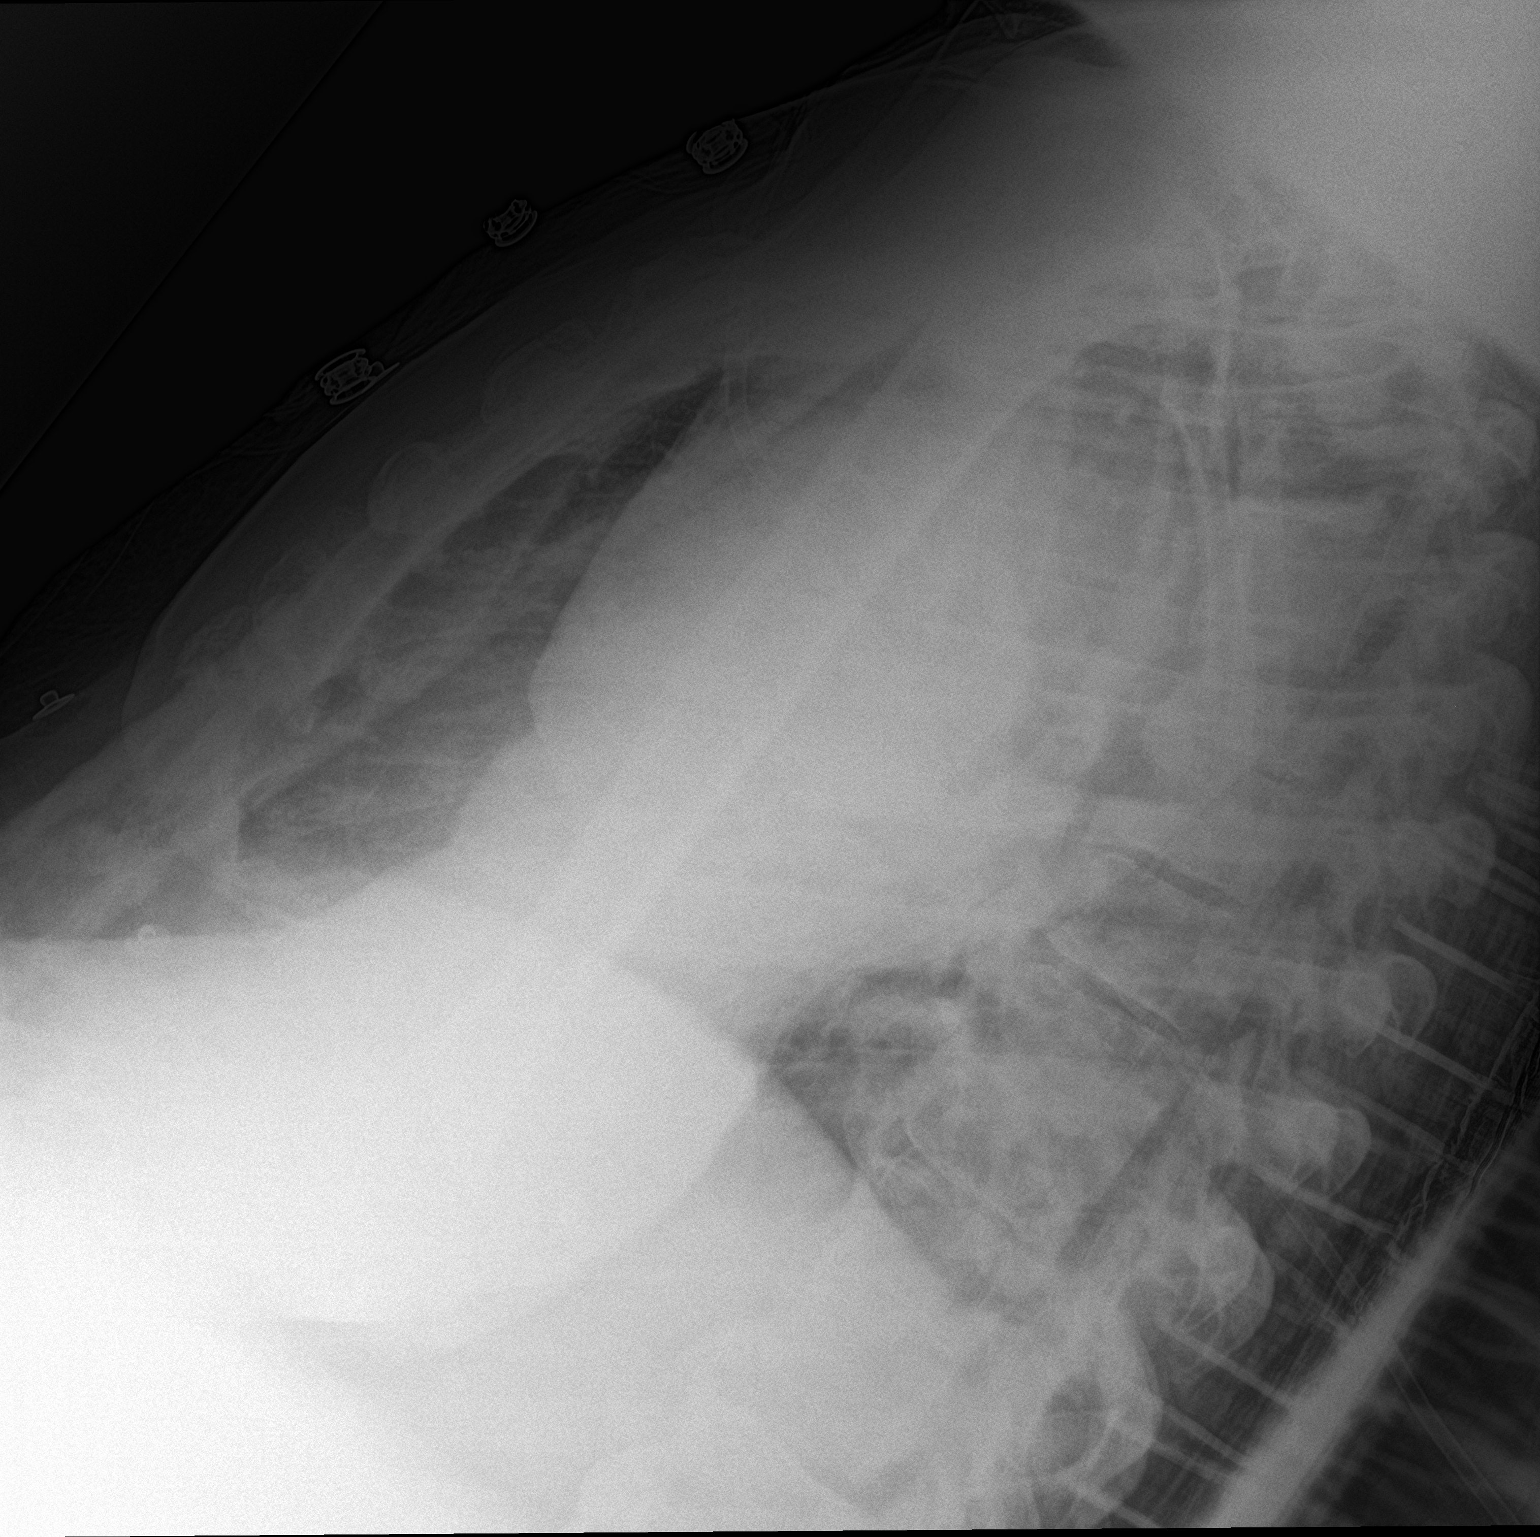

[chest ap]
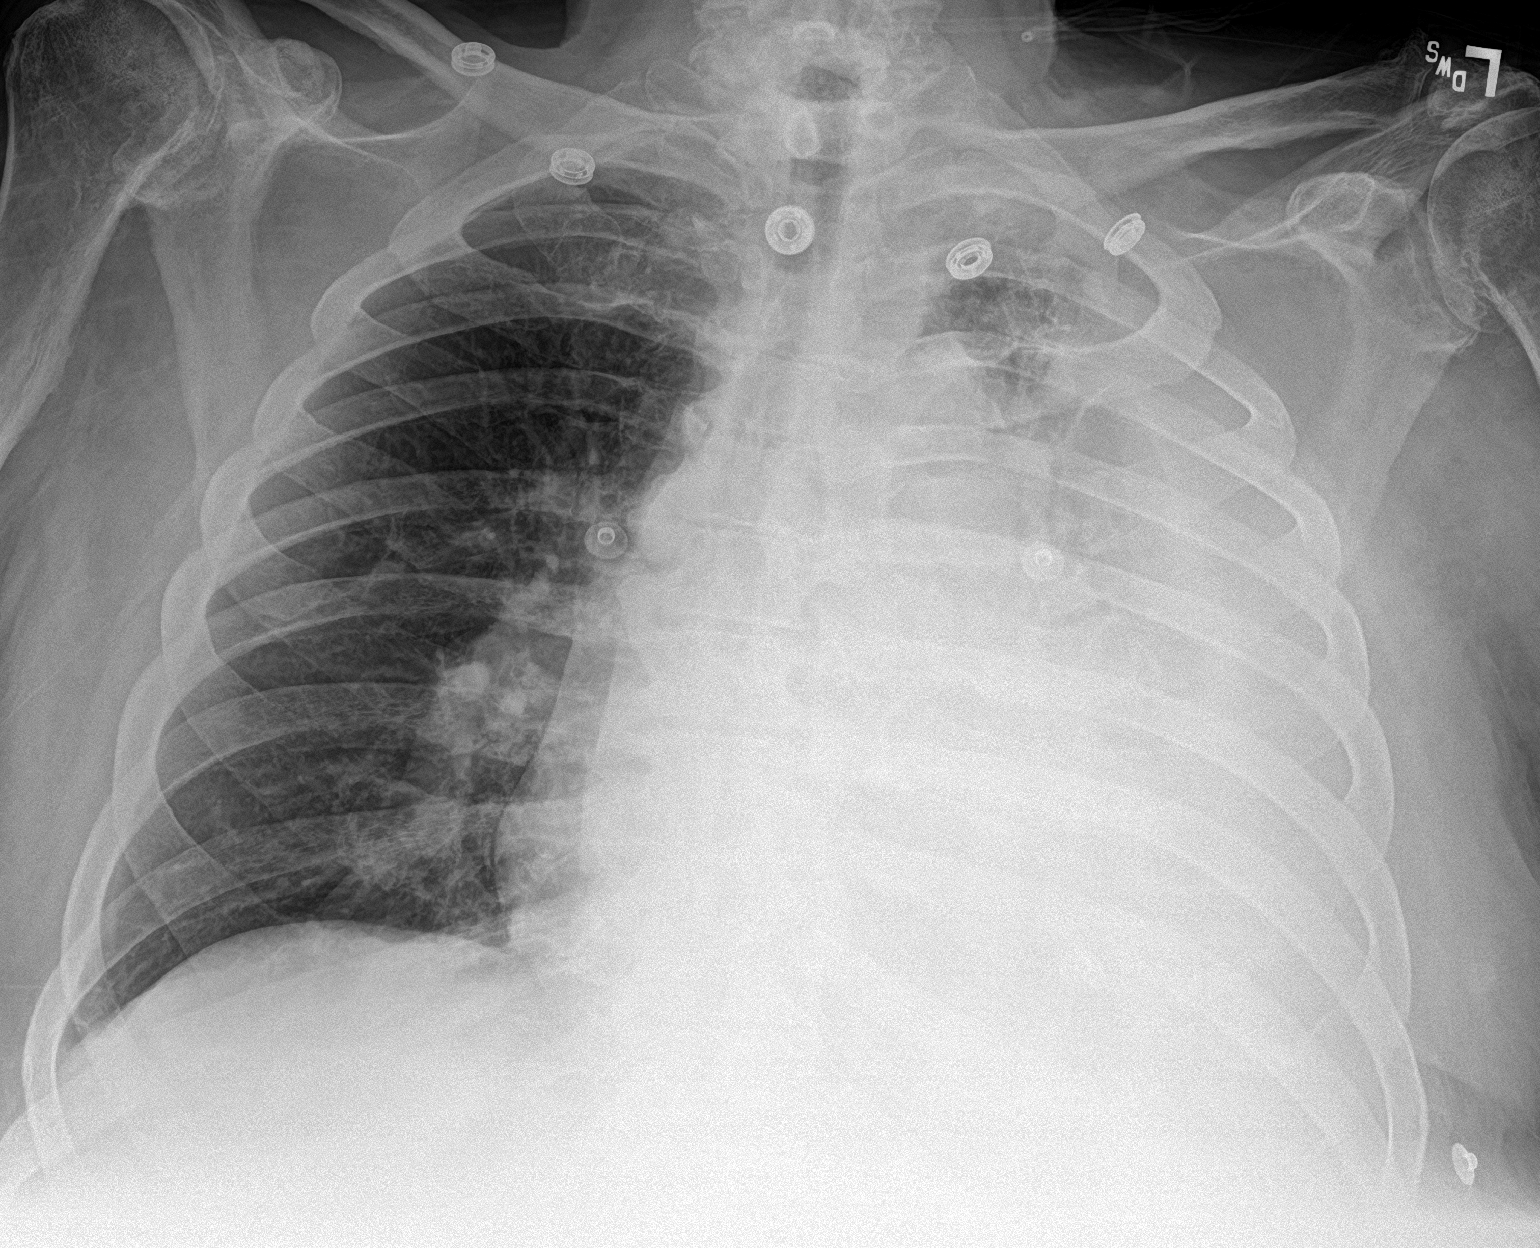

[2 of 2 positions shown; findings below may reference images not displayed]

FINDINGS: Enlargement of cardiac silhouette.

Large LEFT pleural effusion with subtotal atelectasis of LEFT lung.

Underlying infiltrate and other abnormalities including tumor not
excluded in this setting.

Minimal atelectasis at RIGHT base.

Remaining RIGHT lung clear.

No pneumothorax or acute osseous findings.

Osseous demineralization with degenerative changes of the
glenohumeral joints and thoracic spine.
IMPRESSION: Large LEFT pleural effusion with subtotal atelectasis of LEFT lung.

Enlargement of cardiac silhouette with minimal RIGHT basilar
atelectasis.

## 2021-03-07 IMAGING — CT CT CHEST W/ CM
2 of 4 series · 15 of 36 positions shown, 18 images · IV contrast (omnipaque)
Comparison: Chest radiograph [DATE]

CLINICAL DATA: Pleural effusion, hypotension, shortness of breath,
weakness

EXAM:
CT CHEST WITH CONTRAST
TECHNIQUE: Multidetector CT imaging of the chest was performed during
intravenous contrast administration.
CONTRAST:  80mL OMNIPAQUE IOHEXOL 300 MG/ML  SOLN IV

[Series 2: axial st · axial · 0.93mm/px · z∈[-782,-424]mm · 12 of 213 slices shown, 15 images]
[im 17/213  mediastinal]
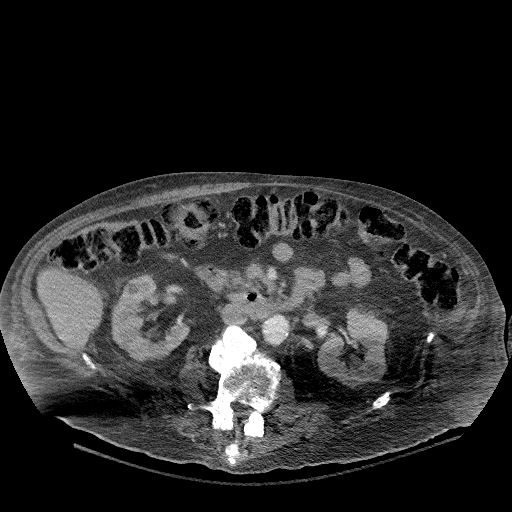
[im 17/213  lung]
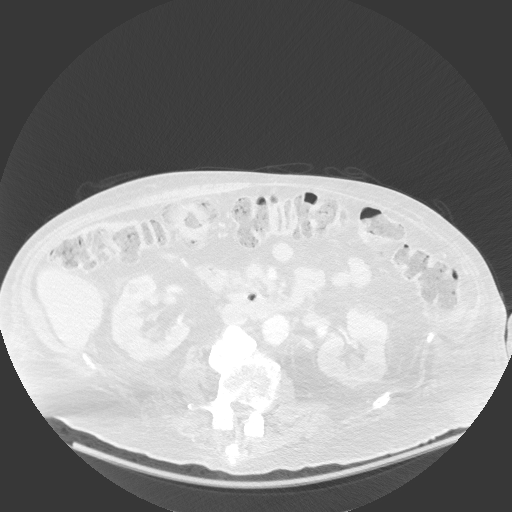
[im 33/213  lung]
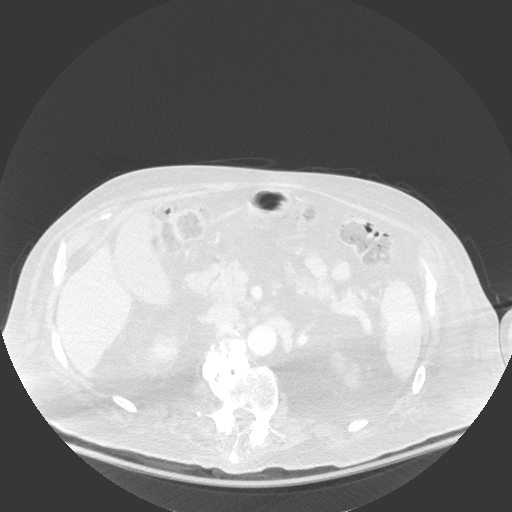
[im 49/213  lung]
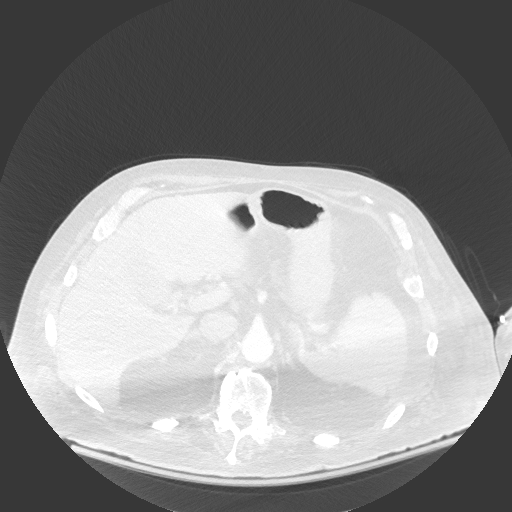
[im 66/213  lung]
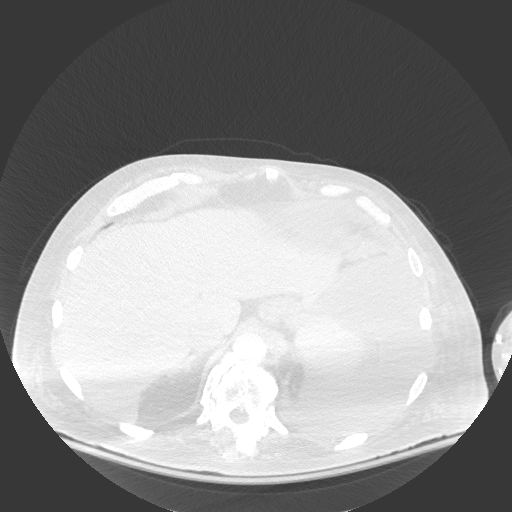
[im 82/213  mediastinal]
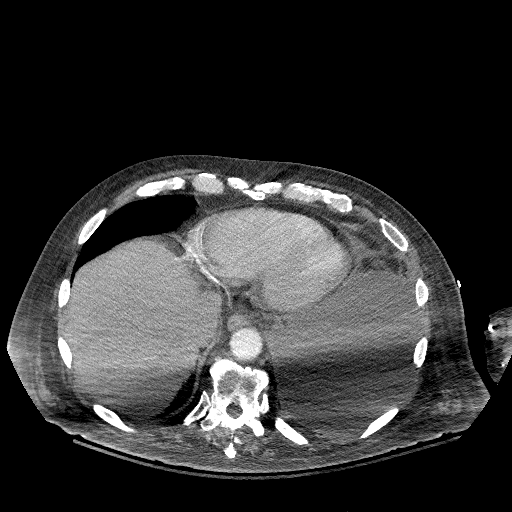
[im 82/213  lung]
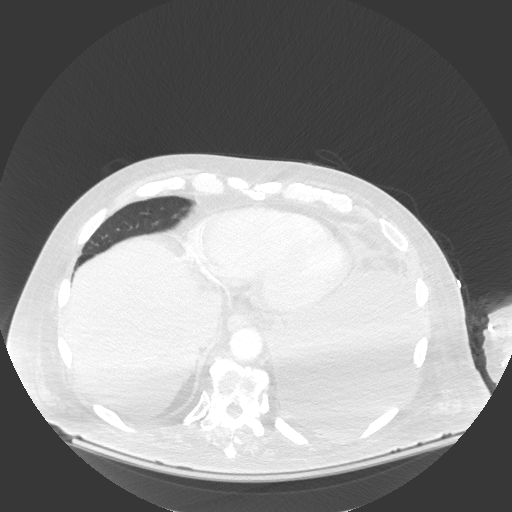
[im 98/213  lung]
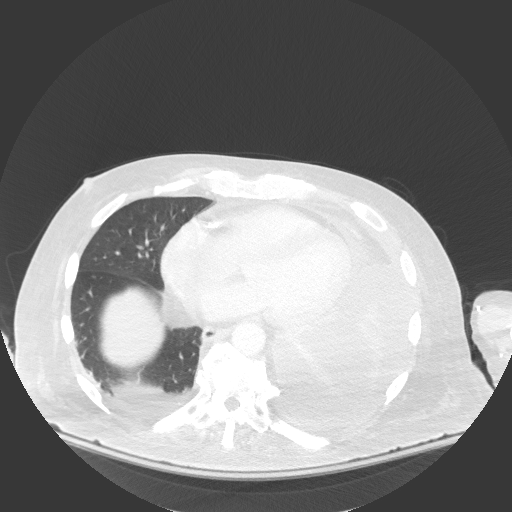
[im 115/213  lung]
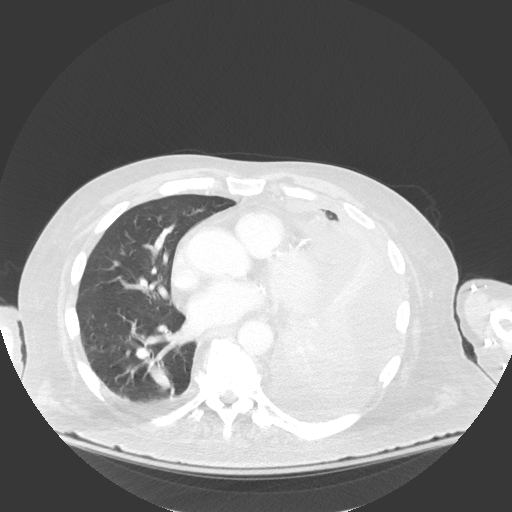
[im 131/213  lung]
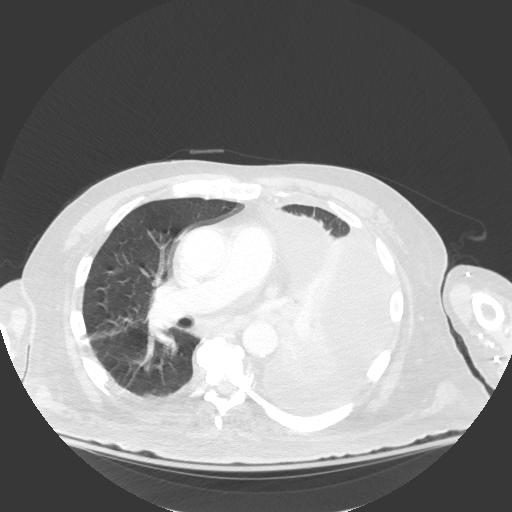
[im 147/213  mediastinal]
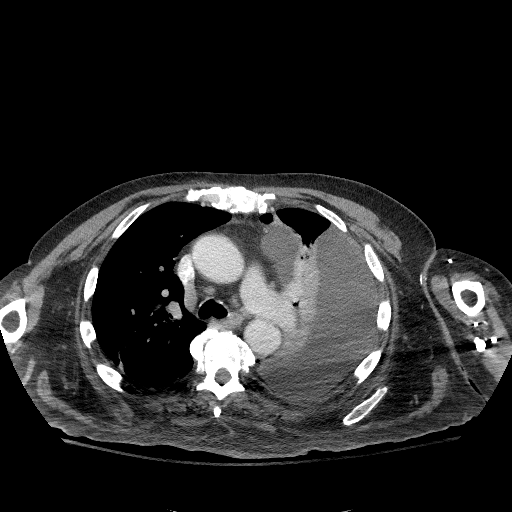
[im 147/213  lung]
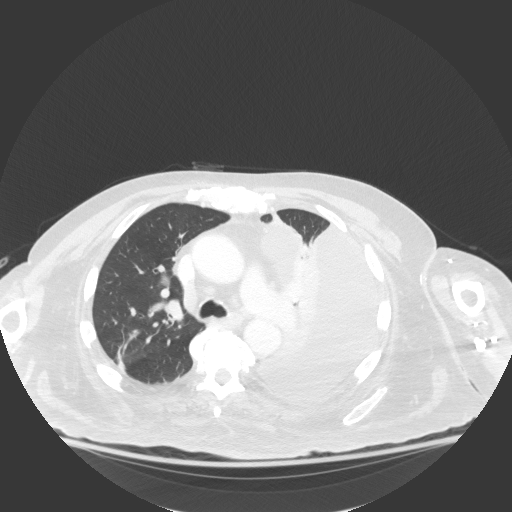
[im 164/213  lung]
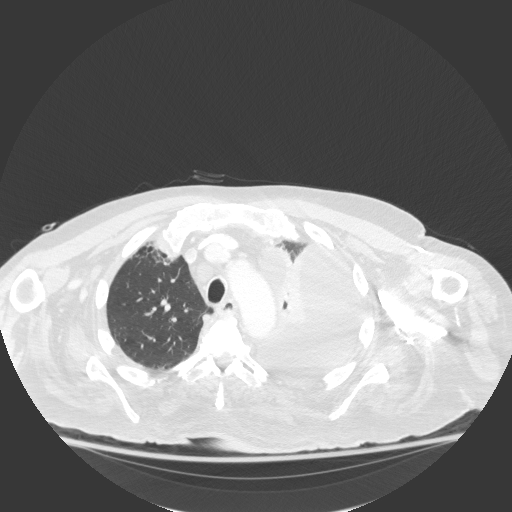
[im 180/213  lung]
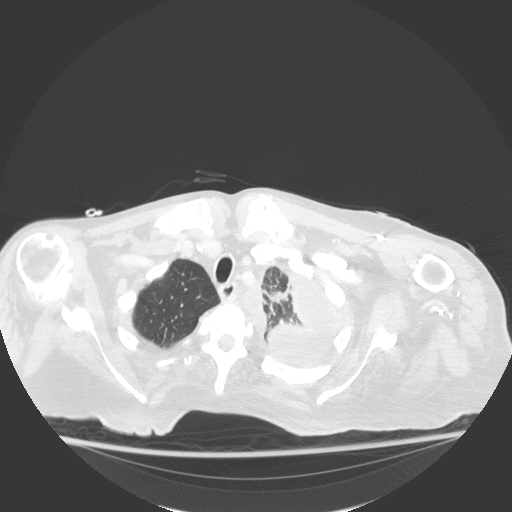
[im 196/213  lung]
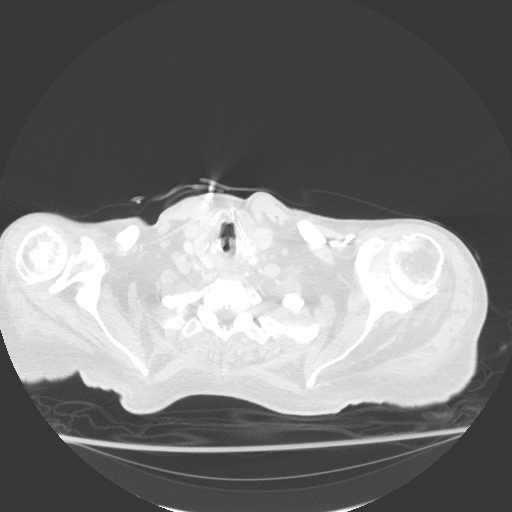

[Series 5: coronal · coronal · 0.87mm/px · 3 of 164 slices shown]
[im 33/164  lung]
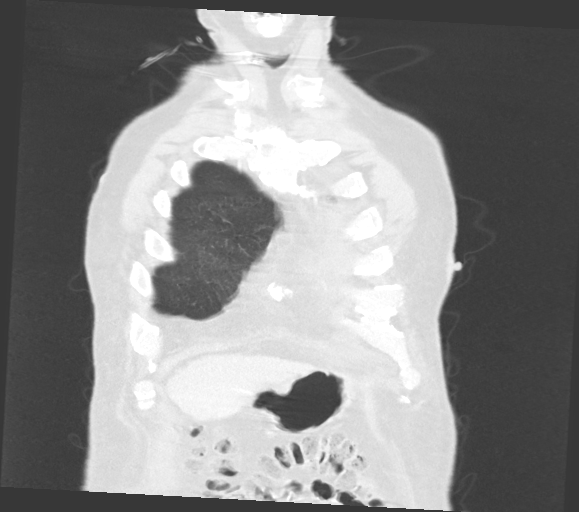
[im 66/164  lung]
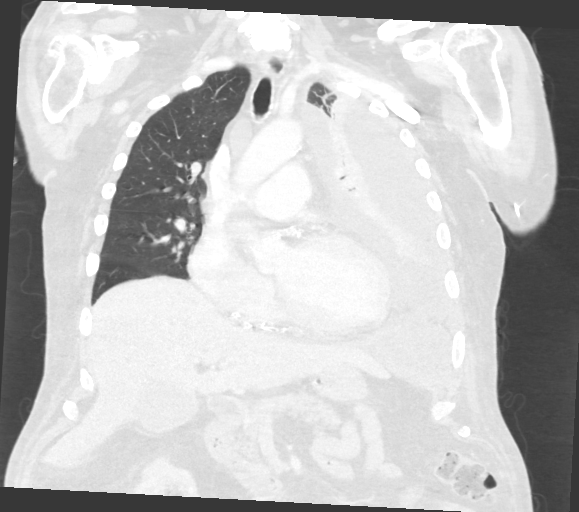
[im 98/164  lung]
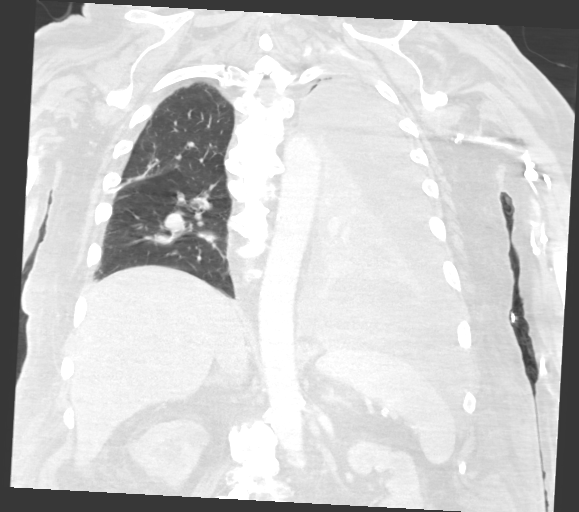

[15 of 36 positions shown; findings below may reference images not displayed]

FINDINGS: Cardiovascular: Atherosclerotic calcifications aorta, coronary
arteries, and proximal great vessels. Aneurysmal dilatation
ascending thoracic aorta 4.3 cm transverse. Mild enlargement of
cardiac chambers. Minimal pericardial effusion.

Mediastinum/Nodes: Base of cervical region normal appearance.
Esophagus unremarkable. 11 mm short axis AP window lymph node image
80. Multiple additional normal sized mediastinal lymph nodes. Fluid
collection at the RIGHT paratracheal region 24 x 18 mm likely small
nonspecific cyst, less likely fluid within a superior pericardial
recess.

Lungs/Pleura: Partially loculated large LEFT pleural effusion.
Complete atelectasis LEFT lower lobe with subtotal atelectasis of
LEFT upper lobe. Fluid-filled lower lobe airways. Small RIGHT
pleural effusion and minimal basilar atelectasis.

Upper Abdomen: Liver, spleen, adrenal glands, pancreas, and
gallbladder unremarkable. Small cysts at upper pole pole of RIGHT
kidney. No acute upper abdominal findings.

Musculoskeletal: Advanced degenerative changes BILATERAL
glenohumeral joints and LEFT sternoclavicular joint. Advanced
degenerative changes of the thoracic spine.
IMPRESSION: Large partially loculated LEFT pleural effusion with complete
atelectasis of LEFT lower lobe and subtotal atelectasis of LEFT
upper lobe.

Small RIGHT pleural effusion and minimal basilar atelectasis.

Nonspecific mildly enlarged AP window lymph node.

Extensive atherosclerotic disease changes including coronary
arteries.

Aortic Atherosclerosis ([B8]-[B8]).

## 2021-03-07 MED ORDER — LATANOPROST 0.005 % OP SOLN
1.0000 [drp] | Freq: Every day | OPHTHALMIC | Status: DC
  Administered 2021-03-08 – 2021-03-13 (×6): 1 [drp] via OPHTHALMIC
  Filled 2021-03-07 (×2): qty 2.5

## 2021-03-07 MED ORDER — BRIMONIDINE TARTRATE 0.2 % OP SOLN
1.0000 [drp] | Freq: Two times a day (BID) | OPHTHALMIC | Status: DC
  Administered 2021-03-08 – 2021-03-13 (×11): 1 [drp] via OPHTHALMIC
  Filled 2021-03-07 (×2): qty 5

## 2021-03-07 MED ORDER — CALCIUM CARBONATE ANTACID 500 MG PO CHEW: 1.0000 | CHEWABLE_TABLET | Freq: Two times a day (BID) | ORAL | Status: DC | PRN

## 2021-03-07 MED ORDER — IOHEXOL 300 MG/ML  SOLN
80.0000 mL | Freq: Once | INTRAMUSCULAR | Status: AC | PRN
  Administered 2021-03-07: 80 mL via INTRAVENOUS

## 2021-03-07 MED ORDER — SODIUM CHLORIDE 0.9 % IV SOLN
500.0000 mg | Freq: Once | INTRAVENOUS | Status: AC
  Administered 2021-03-07: 500 mg via INTRAVENOUS
  Filled 2021-03-07: qty 500

## 2021-03-07 MED ORDER — SODIUM CHLORIDE 0.9 % IV SOLN
500.0000 mg | INTRAVENOUS | Status: DC
  Administered 2021-03-08 – 2021-03-10 (×3): 500 mg via INTRAVENOUS
  Filled 2021-03-07 (×7): qty 500

## 2021-03-07 MED ORDER — SODIUM CHLORIDE 0.9 % IV SOLN
1.0000 g | Freq: Once | INTRAVENOUS | Status: AC
  Administered 2021-03-07: 1 g via INTRAVENOUS
  Filled 2021-03-07: qty 10

## 2021-03-07 MED ORDER — SODIUM CHLORIDE 0.9 % IV SOLN: INTRAVENOUS | Status: DC

## 2021-03-07 MED ORDER — ACETAMINOPHEN 650 MG RE SUPP
650.0000 mg | Freq: Four times a day (QID) | RECTAL | Status: DC | PRN
  Filled 2021-03-07: qty 1

## 2021-03-07 MED ORDER — SIMVASTATIN 20 MG PO TABS
20.0000 mg | ORAL_TABLET | Freq: Every day | ORAL | Status: DC
  Administered 2021-03-08 – 2021-03-13 (×6): 20 mg via ORAL
  Filled 2021-03-07 (×6): qty 1

## 2021-03-07 MED ORDER — BACLOFEN 10 MG PO TABS
5.0000 mg | ORAL_TABLET | Freq: Three times a day (TID) | ORAL | Status: DC
  Administered 2021-03-08 – 2021-03-13 (×18): 5 mg via ORAL
  Filled 2021-03-07 (×21): qty 0.5

## 2021-03-07 MED ORDER — POLYETHYLENE GLYCOL 3350 17 G PO PACK: 17.0000 g | PACK | Freq: Every day | ORAL | Status: DC | PRN

## 2021-03-07 MED ORDER — FAMOTIDINE 20 MG PO TABS
20.0000 mg | ORAL_TABLET | Freq: Every evening | ORAL | Status: DC
  Administered 2021-03-08 – 2021-03-13 (×6): 20 mg via ORAL
  Filled 2021-03-07 (×6): qty 1

## 2021-03-07 MED ORDER — CARVEDILOL 6.25 MG PO TABS
6.2500 mg | ORAL_TABLET | Freq: Two times a day (BID) | ORAL | Status: DC
  Administered 2021-03-08 – 2021-03-13 (×11): 6.25 mg via ORAL
  Filled 2021-03-07 (×12): qty 1

## 2021-03-07 MED ORDER — VITAMIN B-12 1000 MCG PO TABS
2000.0000 ug | ORAL_TABLET | Freq: Every day | ORAL | Status: DC
  Administered 2021-03-08 – 2021-03-13 (×6): 2000 ug via ORAL
  Filled 2021-03-07 (×7): qty 2

## 2021-03-07 MED ORDER — SODIUM CHLORIDE 0.9 % IV SOLN
1.0000 g | INTRAVENOUS | Status: DC
  Administered 2021-03-08 – 2021-03-12 (×5): 1 g via INTRAVENOUS
  Filled 2021-03-07 (×2): qty 10
  Filled 2021-03-07: qty 1
  Filled 2021-03-07 (×3): qty 10

## 2021-03-07 MED ORDER — TAMSULOSIN HCL 0.4 MG PO CAPS
0.4000 mg | ORAL_CAPSULE | Freq: Every day | ORAL | Status: DC
  Administered 2021-03-08 – 2021-03-13 (×6): 0.4 mg via ORAL
  Filled 2021-03-07 (×6): qty 1

## 2021-03-07 MED ORDER — INSULIN ASPART 100 UNIT/ML IJ SOLN
0.0000 [IU] | Freq: Three times a day (TID) | INTRAMUSCULAR | Status: DC
  Administered 2021-03-09: 09:00:00 2 [IU] via SUBCUTANEOUS
  Filled 2021-03-07: qty 1

## 2021-03-07 MED ORDER — INSULIN DETEMIR 100 UNIT/ML ~~LOC~~ SOLN
12.0000 [IU] | Freq: Every day | SUBCUTANEOUS | Status: DC
  Filled 2021-03-07 (×2): qty 0.12

## 2021-03-07 MED ORDER — ACETAMINOPHEN 325 MG PO TABS
650.0000 mg | ORAL_TABLET | Freq: Four times a day (QID) | ORAL | Status: DC | PRN
  Administered 2021-03-09 – 2021-03-11 (×2): 650 mg via ORAL
  Filled 2021-03-07 (×2): qty 2

## 2021-03-07 MED ORDER — ENOXAPARIN SODIUM 60 MG/0.6ML IJ SOSY
0.5000 mg/kg | PREFILLED_SYRINGE | INTRAMUSCULAR | Status: DC
  Administered 2021-03-07 – 2021-03-13 (×7): 52.5 mg via SUBCUTANEOUS
  Filled 2021-03-07 (×7): qty 0.6

## 2021-03-07 MED ORDER — METFORMIN HCL ER 500 MG PO TB24
500.0000 mg | ORAL_TABLET | Freq: Two times a day (BID) | ORAL | Status: DC
  Administered 2021-03-08: 17:00:00 500 mg via ORAL
  Filled 2021-03-07 (×4): qty 1

## 2021-03-07 MED ORDER — GABAPENTIN 300 MG PO CAPS
300.0000 mg | ORAL_CAPSULE | Freq: Every day | ORAL | Status: DC
  Administered 2021-03-08 – 2021-03-13 (×6): 300 mg via ORAL
  Filled 2021-03-07 (×6): qty 1

## 2021-03-07 NOTE — ED Provider Notes (Signed)
[facility] Emergency Department Provider Note   ____________________________________________   Event Date/Time   First MD Initiated Contact with Patient 03/07/21 1431     (approximate)  I have reviewed the triage vital signs and the nursing notes.   HISTORY  Chief Complaint Hypotension, Shortness of Breath, and Weakness    HPI Ky Clum. is a 75 y.o. male who presents for worsening shortness of breath  LOCATION: Chest DURATION: 1 week prior to arrival TIMING: Worsening since onset SEVERITY: Severe QUALITY: Shortness of breath CONTEXT: Patient states he was hospitalized proximately 1 month ago for angioedema where he was intubated in the ICU.  Since that time patient states that he has had intermittent and worsening shortness of breath MODIFYING FACTORS: Exertion worsens the shortness of breath and is partially relieved at rest and with supplemental oxygenation ASSOCIATED SYMPTOMS: Denies   Per medical record review, patient was recently hospitalized proximally 1 month prior to arrival for angioedema as well and was treated for presumed hospital-acquired pneumonia approximately 2 weeks prior to arrival          Past Medical History:  Diagnosis Date   Bedbound    Bilateral shoulder pain    CHF (congestive heart failure) (HCC)    Coronary artery disease    Diabetes mellitus without complication (HCC)    GERD (gastroesophageal reflux disease)    Glaucoma    HLD (hyperlipidemia)    Hypertension    Lumbar stenosis    Non-ischemic cardiomyopathy (HCC)    PVC (premature ventricular contraction)    Sleep apnea    H/O NO CPAP IN 18 YRS    Patient Active Problem List   Diagnosis Date Noted   Recurrent left pleural effusion 03/07/2021   Angio-edema    Obesity (BMI 30.0-34.9)    Respiratory failure (HCC) 01/08/2021   Chronic indwelling Foley catheter 08/24/2020   Wheelchair bound 08/24/2020   Complicated UTI (urinary tract  infection) 08/24/2020   COVID-19 virus infection 08/24/2020   Acute respiratory failure with hypoxia (HCC) 08/24/2020   NICM (nonischemic cardiomyopathy) (HCC) 08/24/2020   CAD (coronary artery disease) 08/24/2020   Type 2 diabetes mellitus with hyperlipidemia (HCC) 08/24/2020   Cervical myelopathy (HCC) 05/05/2020   Post-operative state 01/31/2020   Essential hypertension    Type 2 diabetes mellitus without complication, with long-term current use of insulin (HCC)    HLD (hyperlipidemia)    Chronic diastolic CHF (congestive heart failure) (HCC)    S/P cervical spinal fusion 01/29/2020   Weakness    Spinal stenosis    History of progressive weakness    Claudication (HCC)    Lower extremity weakness 01/14/2020    Past Surgical History:  Procedure Laterality Date   ANTERIOR CERVICAL DECOMP/DISCECTOMY FUSION N/A 01/29/2020   Procedure: ANTERIOR CERVICAL DECOMPRESSION/DISCECTOMY FUSION 1 LEVEL C3/4;  Surgeon: [name], MD;  Location: [facility] ORS;  Service: Neurosurgery;  Laterality: N/A;   arm surgery Right    4x surgery as a child, cut arm falling through glass window    HYDROCELE EXCISION / REPAIR     POSTERIOR CERVICAL FUSION/FORAMINOTOMY N/A 05/06/2020   Procedure: C3-4 LAMINECTOMY & C3-4 FUSION;  Surgeon: [name], MD;  Location: [facility] ORS;  Service: Neurosurgery;  Laterality: N/A;   REPLACEMENT TOTAL KNEE Right     Prior to Admission medications   Medication Sig Start Date End Date Taking? Authorizing Provider  acetaminophen (TYLENOL) 500 MG tablet Take 2 tablets (1,000 mg total) by mouth every 6 (six)  hours. Patient taking differently: Take 1,000 mg by mouth in the morning and at bedtime. 05/08/20  Yes Venetia Night, MD  amLODipine (NORVASC) 10 MG tablet Take 10 mg by mouth in the morning.   Yes [provider]  aspirin 81 MG chewable tablet Chew 81 mg by mouth daily.   Yes [provider]  baclofen (LIORESAL) 10 MG tablet Take 5 mg by mouth 3 (three)  times daily.   Yes [provider]  brimonidine (ALPHAGAN) 0.2 % ophthalmic solution Place 1 drop into the right eye in the morning and at bedtime. 11/21/20  Yes [provider]  calcium carbonate (TUMS - DOSED IN MG ELEMENTAL CALCIUM) 500 MG chewable tablet Chew 1 tablet by mouth 2 (two) times daily as needed for indigestion or heartburn.   Yes [provider]  carvedilol (COREG) 6.25 MG tablet Take 6.25 mg by mouth 2 (two) times daily. 12/31/20  Yes [provider]  cyanocobalamin 2000 MCG tablet Take 2,000 mcg by mouth daily. 01/24/19  Yes [provider]  famotidine (PEPCID) 20 MG tablet Take 20 mg by mouth every evening.   Yes [provider]  gabapentin (NEURONTIN) 300 MG capsule Take 300 mg by mouth at bedtime.   Yes [provider]  insulin aspart (NOVOLOG) 100 UNIT/ML injection Inject 4-8 Units into the skin 3 (three) times daily with meals. Sliding scale   Yes [provider]  insulin detemir (LEVEMIR FLEXTOUCH) 100 UNIT/ML FlexPen Inject 12 Units into the skin at bedtime. 01/11/21  Yes Wieting, Richard, MD  latanoprost (XALATAN) 0.005 % ophthalmic solution Place 1 drop into the right eye at bedtime. 08/20/19  Yes [provider]  meloxicam (MOBIC) 7.5 MG tablet Take 7.5 mg by mouth in the morning.   Yes [provider]  metFORMIN (GLUCOPHAGE-XR) 500 MG 24 hr tablet Take 500 mg by mouth 2 (two) times daily. 01/19/21  Yes [provider]  Multiple Vitamins-Minerals (CENTRUM SILVER 50+MEN PO) Take 1 tablet by mouth daily.   Yes [provider]  polyethylene glycol powder (GLYCOLAX/MIRALAX) 17 GM/SCOOP powder Take 0.5 Containers by mouth daily as needed for moderate constipation.   Yes [provider]  simvastatin (ZOCOR) 20 MG tablet Take 20 mg by mouth daily at 6 PM.   Yes [provider]  tamsulosin (FLOMAX) 0.4 MG CAPS capsule Take 1 capsule (0.4 mg total) by mouth daily  after supper. 01/25/20  Yes Wouk, Wilfred Curtis, MD    Allergies Ace inhibitors and Lisinopril  No family history on file.  Social History Social History   Tobacco Use   Smoking status: Never   Smokeless tobacco: Never  Vaping Use   Vaping Use: Never used  Substance Use Topics   Alcohol use: No   Drug use: Never    Review of Systems Constitutional: No fever/chills Eyes: No visual changes. ENT: No sore throat. Cardiovascular: Denies chest pain. Respiratory: Endorses shortness of breath. Gastrointestinal: No abdominal pain.  No nausea, no vomiting.  No diarrhea. Genitourinary: Negative for dysuria. Musculoskeletal: Negative for acute arthralgias Skin: Negative for rash. Neurological: Negative for headaches, weakness/numbness/paresthesias in any extremity Psychiatric: Negative for suicidal ideation/homicidal ideation   ____________________________________________   PHYSICAL EXAM:  VITAL SIGNS: ED Triage Vitals  Enc Vitals Group     BP 03/07/21 1110 120/71     Pulse Rate 03/07/21 1110 87     Resp 03/07/21 1110 19     Temp 03/07/21 1110 99.4 F (37.4 C)  Temp Source 03/07/21 1110 Oral     SpO2 03/07/21 1110 94 %     Weight 03/07/21 1036 229 lb (103.9 kg)     Height 03/07/21 1036 6' (1.829 m)     Head Circumference --      Peak Flow --      Pain Score 03/07/21 1035 5     Pain Loc --      Pain Edu? --      Excl. in GC? --    Constitutional: Alert and oriented. Well appearing and in no acute distress. Eyes: Conjunctivae are normal. PERRL. Head: Atraumatic. Nose: No congestion/rhinnorhea. Mouth/Throat: Mucous membranes are moist. Neck: No stridor Cardiovascular: Grossly normal heart sounds.  Good peripheral circulation. Respiratory: Increased respiratory effort.  Absent lung sounds on left lung fields.  No retractions. Gastrointestinal: Soft and nontender. No distention. Musculoskeletal: No obvious deformities Neurologic:  Normal speech and language. No  gross focal neurologic deficits are appreciated. Skin:  Skin is warm and dry. No rash noted. Psychiatric: Mood and affect are normal. Speech and behavior are normal.  ____________________________________________   LABS (all labs ordered are listed, but only abnormal results are displayed)  Labs Reviewed  BASIC METABOLIC PANEL - Abnormal; Notable for the following components:      Result Value   Sodium 125 (*)    Chloride 89 (*)    BUN 34 (*)    Calcium 8.8 (*)    All other components within normal limits  CBC - Abnormal; Notable for the following components:   WBC 32.9 (*)    RBC 3.49 (*)    Hemoglobin 10.8 (*)    HCT 32.6 (*)    All other components within normal limits  URINALYSIS, ROUTINE W REFLEX MICROSCOPIC - Abnormal; Notable for the following components:   Color, Urine AMBER (*)    Specific Gravity, Urine >1.030 (*)    Bilirubin Urine SMALL (*)    Protein, ur 100 (*)    Nitrite POSITIVE (*)    Leukocytes,Ua SMALL (*)    Bacteria, UA MANY (*)    All other components within normal limits  BRAIN NATRIURETIC PEPTIDE - Abnormal; Notable for the following components:   B Natriuretic Peptide 485.0 (*)    All other components within normal limits  RESP PANEL BY RT-PCR (FLU A&B, COVID) ARPGX2  CBG MONITORING, ED   ____________________________________________  EKG  ED ECG REPORT I, Merwyn Katos, the attending physician, personally viewed and interpreted this ECG.  Date: 03/07/2021 EKG Time: 1049 Rate: 87 Rhythm: normal sinus rhythm QRS Axis: normal Intervals: normal ST/T Wave abnormalities: normal Narrative Interpretation: no evidence of acute ischemia  ____________________________________________  RADIOLOGY  ED MD interpretation: 2 view chest x-ray shows large left-sided pleural effusion with subtotal atelectasis of the left lung  CT with contrast shows a large left-sided pleural effusion with total atelectasis to lower lung field and subtotal atelectasis  to the upper lung fields  Official radiology report(s): DG Chest 2 View  Result Date: 03/07/2021 CLINICAL DATA:  Hypotension, shortness of breath, weakness, angioedema, bundle branch block EXAM: CHEST - 2 VIEW COMPARISON:  01/09/2021 FINDINGS: Enlargement of cardiac silhouette. Large LEFT pleural effusion with subtotal atelectasis of LEFT lung. Underlying infiltrate and other abnormalities including tumor not excluded in this setting. Minimal atelectasis at RIGHT base. Remaining RIGHT lung clear. No pneumothorax or acute osseous findings. Osseous demineralization with degenerative changes of the glenohumeral joints and thoracic spine. IMPRESSION: Large LEFT pleural effusion with subtotal atelectasis of LEFT  lung. Enlargement of cardiac silhouette with minimal RIGHT basilar atelectasis. Electronically Signed   By: Ulyses Southward M.D.   On: 03/07/2021 11:16   CT Chest W Contrast  Result Date: 03/07/2021 CLINICAL DATA:  Pleural effusion, hypotension, shortness of breath, weakness EXAM: CT CHEST WITH CONTRAST TECHNIQUE: Multidetector CT imaging of the chest was performed during intravenous contrast administration. CONTRAST:  63mL OMNIPAQUE IOHEXOL 300 MG/ML  SOLN IV COMPARISON:  Chest radiograph 03/07/2021 FINDINGS: Cardiovascular: Atherosclerotic calcifications aorta, coronary arteries, and proximal great vessels. 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 Mild enlargement of cardiac chambers. Minimal pericardial effusion. Mediastinum/Nodes: Base of cervical region normal appearance. Esophagus unremarkable. 11 mm short axis AP window lymph node image 80. Multiple additional normal sized mediastinal lymph nodes. Fluid collection at the RIGHT paratracheal region 24 x 18 mm likely small nonspecific cyst, less likely fluid within a superior pericardial recess. Lungs/Pleura: Partially loculated large LEFT pleural effusion. Complete atelectasis LEFT lower lobe with subtotal atelectasis of LEFT  upper lobe. Fluid-filled lower lobe airways. Small RIGHT pleural effusion and minimal basilar atelectasis. Upper Abdomen: Liver, spleen, adrenal glands, pancreas, and gallbladder unremarkable. Small cysts at upper pole pole of RIGHT kidney. No acute upper abdominal findings. Musculoskeletal: Advanced degenerative changes BILATERAL glenohumeral joints and LEFT sternoclavicular joint. Advanced degenerative changes of the thoracic spine. IMPRESSION: Large partially loculated LEFT pleural effusion with complete atelectasis of LEFT lower lobe and subtotal atelectasis of LEFT upper lobe. Small RIGHT pleural effusion and minimal basilar atelectasis. Nonspecific mildly enlarged AP window lymph node. Extensive atherosclerotic disease changes including coronary arteries. Aortic Atherosclerosis (ICD10-I70.0). Electronically Signed   By: Ulyses Southward M.D.   On: 03/07/2021 12:34    ____________________________________________   PROCEDURES  Procedure(s) performed (including Critical Care):  .1-3 Lead EKG Interpretation Performed by: Merwyn Katos, MD Authorized by: Merwyn Katos, MD     Interpretation: normal     ECG rate:  83   ECG rate assessment: normal     Rhythm: sinus rhythm     Ectopy: none     Conduction: normal    CRITICAL CARE Performed by: Merwyn Katos   Total critical care time: 33 minutes  Critical care time was exclusive of separately billable procedures and treating other patients.  Critical care was necessary to treat or prevent imminent or life-threatening deterioration.  Critical care was time spent personally by me on the following activities: development of treatment plan with patient and/or surrogate as well as nursing, discussions with consultants, evaluation of patient's response to treatment, examination of patient, obtaining history from patient or surrogate, ordering and performing treatments and interventions, ordering and review of laboratory studies, ordering and  review of radiographic studies, pulse oximetry and re-evaluation of patient's condition.  ____________________________________________   INITIAL IMPRESSION / ASSESSMENT AND PLAN / ED COURSE  As part of my medical decision making, I reviewed the following data within the electronic medical record, if available:  Nursing notes reviewed and incorporated, Labs reviewed, EKG interpreted, Old chart reviewed, Radiograph reviewed and Notes from prior ED visits reviewed and incorporated      Patient 75 year old male who presents for signs of systems concerning for shortness of breath with hypoxia DDx: COPD exacerbation, CHF exacerbation, PE, pleural effusion, pneumonia Findings: Chest x-ray showing large left-sided pleural effusion-interventional radiology plan to perform thoracentesis under guidance  Dispo: Admit to medicine     ____________________________________________   FINAL CLINICAL IMPRESSION(S) / ED DIAGNOSES  Final diagnoses:  None     ED  Discharge Orders     None        Note:  This document was prepared using Dragon voice recognition software and may include unintentional dictation errors.    Merwyn Katos, MD 03/07/21 [phone]

## 2021-03-07 NOTE — ED Notes (Signed)
Report received from Ariel, RN 

## 2021-03-07 NOTE — H&P (Signed)
History and Physical  Roy [name]. [id number] DOB: 29-Aug-1945 DOA: 03/07/2021  Referring physician: DR Cheri Fowler  PCP: Lorelee New, MD  Outpatient Specialists:  Patient coming from: HOME & is NOT able to ambulate   Chief Complaint: Shortness of breath and low blood pressure and generalized weakness  HPI: Roy [name]. is a 75 y.o. male with medical history significant for paraplegia secondary to spinal stenosis who is bedbound, angioedema from ACE inhibitor status post intubation recently a month ago recent pneumonia about 6 weeks ago, diabetes mellitus, coronary artery disease, nonischemic cardiomyopathy who presented to the emergency department weeks hypotension with syncope shortness of breath and weakness.  He stated that his blood pressure was 62/42 and oxygen was 78 to 81% yesterday but patient refused to go to the ER when EMS was called,  today he presented to the emergency department due to worsening shortness of breath.  History was mostly obtained from wife who is at bedside.  She said she is a retired Immunologist  ED Course: CT scan as well as chest x-ray was done which showed white out of the left lung with pleural effusion also his WBC was noted to be elevated with a white count of 32 sodium was 125 his urine was abnormal.  On examination in the ED patient was asleep upon waking up he was quite disoriented and was talking that he was in the shape and did not know his wife was by his bedside.  His wife noted that he was completely oriented even when his blood pressure was low today even up till the time he went to sleep in the ER today he was completely oriented to place person and time.  She also noted that he had a fever of 103 last night, his UA was abnormal showing specific gravity of greater than 1.030 100 of protein positive nitrite leukocytes and bacteria was many.  His BNP is 485 he was started on Rocephin and Zithromax he also received IV diuresis  Review of  Systems:  Pt complains of shortness of breath low blood pressure and fever  Pt denies any diarrhea.  Review of systems are otherwise negative   Past Medical History:  Diagnosis Date   Bedbound    Bilateral shoulder pain    CHF (congestive heart failure) (HCC)    Coronary artery disease    Diabetes mellitus without complication (HCC)    GERD (gastroesophageal reflux disease)    Glaucoma    HLD (hyperlipidemia)    Hypertension    Lumbar stenosis    Non-ischemic cardiomyopathy (HCC)    PVC (premature ventricular contraction)    Sleep apnea    H/O NO CPAP IN 18 YRS   Past Surgical History:  Procedure Laterality Date   ANTERIOR CERVICAL DECOMP/DISCECTOMY FUSION N/A 01/29/2020   Procedure: ANTERIOR CERVICAL DECOMPRESSION/DISCECTOMY FUSION 1 LEVEL C3/4;  Surgeon: Deetta Perla, MD;  Location: [facility] ORS;  Service: Neurosurgery;  Laterality: N/A;   arm surgery Right    4x surgery as a child, cut arm falling through glass window    HYDROCELE EXCISION / REPAIR     POSTERIOR CERVICAL FUSION/FORAMINOTOMY N/A 05/06/2020   Procedure: C3-4 LAMINECTOMY & C3-4 FUSION;  Surgeon: Deetta Perla, MD;  Location: [facility] ORS;  Service: Neurosurgery;  Laterality: N/A;   REPLACEMENT TOTAL KNEE Right     Social History:  reports that he has never smoked. He has never used smokeless tobacco. He reports that he does not drink alcohol and does  not use drugs.   Allergies  Allergen Reactions   Ace Inhibitors Other (See Comments)    Angioedema   Lisinopril Swelling    Angioedema    No family history on file.    Prior to Admission medications   Medication Sig Start Date End Date Taking? Authorizing Provider  acetaminophen (TYLENOL) 500 MG tablet Take 2 tablets (1,000 mg total) by mouth every 6 (six) hours. Patient taking differently: Take 1,000 mg by mouth in the morning and at bedtime. 05/08/20  Yes Meade Maw, MD  amLODipine (NORVASC) 10 MG tablet Take 10 mg by mouth in the morning.   Yes  [provider]  aspirin 81 MG chewable tablet Chew 81 mg by mouth daily.   Yes [provider]  baclofen (LIORESAL) 10 MG tablet Take 5 mg by mouth 3 (three) times daily.   Yes [provider]  brimonidine (ALPHAGAN) 0.2 % ophthalmic solution Place 1 drop into the right eye in the morning and at bedtime. 11/21/20  Yes [provider]  calcium carbonate (TUMS - DOSED IN MG ELEMENTAL CALCIUM) 500 MG chewable tablet Chew 1 tablet by mouth 2 (two) times daily as needed for indigestion or heartburn.   Yes [provider]  carvedilol (COREG) 6.25 MG tablet Take 6.25 mg by mouth 2 (two) times daily. 12/31/20  Yes [provider]  cyanocobalamin 2000 MCG tablet Take 2,000 mcg by mouth daily. 01/24/19  Yes [provider]  famotidine (PEPCID) 20 MG tablet Take 20 mg by mouth every evening.   Yes [provider]  gabapentin (NEURONTIN) 300 MG capsule Take 300 mg by mouth at bedtime.   Yes [provider]  insulin aspart (NOVOLOG) 100 UNIT/ML injection Inject 4-8 Units into the skin 3 (three) times daily with meals. Sliding scale   Yes [provider]  insulin detemir (LEVEMIR FLEXTOUCH) 100 UNIT/ML FlexPen Inject 12 Units into the skin at bedtime. 01/11/21  Yes Wieting, Richard, MD  latanoprost (XALATAN) 0.005 % ophthalmic solution Place 1 drop into the right eye at bedtime. 08/20/19  Yes [provider]  meloxicam (MOBIC) 7.5 MG tablet Take 7.5 mg by mouth in the morning.   Yes [provider]  metFORMIN (GLUCOPHAGE-XR) 500 MG 24 hr tablet Take 500 mg by mouth 2 (two) times daily. 01/19/21  Yes [provider]  Multiple Vitamins-Minerals (CENTRUM SILVER 50+MEN PO) Take 1 tablet by mouth daily.   Yes [provider]  polyethylene glycol powder (GLYCOLAX/MIRALAX) 17 GM/SCOOP powder Take 0.5 Containers by mouth daily as needed for moderate constipation.   Yes [provider]   simvastatin (ZOCOR) 20 MG tablet Take 20 mg by mouth daily at 6 PM.   Yes [provider]  tamsulosin (FLOMAX) 0.4 MG CAPS capsule Take 1 capsule (0.4 mg total) by mouth daily after supper. 01/25/20  Yes Wouk, Ailene Rud, MD    Physical Exam: BP 109/60   Pulse 84   Temp 99.4 F (37.4 C) (Oral)   Resp 18   Ht 6' (1.829 m)   Wt 103.9 kg   SpO2 97%   BMI 31.06 kg/m   Exam:  General: 75 y.o. year-old male well developed well nourished overweight, in no acute distress.  Alert and oriented x1 .patient had just woken up from sleep and was disoriented Cardiovascular: Regular rate and rhythm with no rubs or gallops.  No thyromegaly or JVD noted.   Respiratory: Clear to auscultation with no wheezes or rales. Good inspiratory effort.  Abdomen: Soft nontender nondistended with normal bowel sounds x4 quadrants. Musculoskeletal: No lower extremity edema.  Paraplegia Skin: No ulcerative lesions noted or rashes, Psychiatry: Mood is appropriate for condition and setting patient slightly confused on waking up from sleep which wife stated is new           Labs on Admission:  Basic Metabolic Panel: Recent Labs  Lab 03/07/21 1044  NA 125*  K 4.5  CL 89*  CO2 28  GLUCOSE 74  BUN 34*  CREATININE 0.96  CALCIUM 8.8*   Liver Function Tests: No results for input(s): AST, ALT, ALKPHOS, BILITOT, PROT, ALBUMIN in the last 168 hours. No results for input(s): LIPASE, AMYLASE in the last 168 hours. No results for input(s): AMMONIA in the last 168 hours. CBC: Recent Labs  Lab 03/07/21 1044  WBC 32.9*  HGB 10.8*  HCT 32.6*  MCV 93.4  PLT 358   Cardiac Enzymes: No results for input(s): CKTOTAL, CKMB, CKMBINDEX, TROPONINI in the last 168 hours.  BNP (last 3 results) Recent Labs    03/07/21 1044  BNP 485.0*    ProBNP (last 3 results) No results for input(s): PROBNP in the last 8760 hours.  CBG: No results for input(s): GLUCAP in the last 168 hours.  Radiological Exams  on Admission: DG Chest 2 View  Result Date: 03/07/2021 CLINICAL DATA:  Hypotension, shortness of breath, weakness, angioedema, bundle branch block EXAM: CHEST - 2 VIEW COMPARISON:  01/09/2021 FINDINGS: Enlargement of cardiac silhouette. Large LEFT pleural effusion with subtotal atelectasis of LEFT lung. Underlying infiltrate and other abnormalities including tumor not excluded in this setting. Minimal atelectasis at RIGHT base. Remaining RIGHT lung clear. No pneumothorax or acute osseous findings. Osseous demineralization with degenerative changes of the glenohumeral joints and thoracic spine. IMPRESSION: Large LEFT pleural effusion with subtotal atelectasis of LEFT lung. Enlargement of cardiac silhouette with minimal RIGHT basilar atelectasis. Electronically Signed   By: Ulyses Southward M.D.   On: 03/07/2021 11:16   CT Chest W Contrast  Result Date: 03/07/2021 CLINICAL DATA:  Pleural effusion, hypotension, shortness of breath, weakness EXAM: CT CHEST WITH CONTRAST TECHNIQUE: Multidetector CT imaging of the chest was performed during intravenous contrast administration. CONTRAST:  30mL OMNIPAQUE IOHEXOL 300 MG/ML  SOLN IV COMPARISON:  Chest radiograph 03/07/2021 FINDINGS: Cardiovascular: Atherosclerotic calcifications aorta, coronary arteries, and proximal great vessels. 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 Mild enlargement of cardiac chambers. Minimal pericardial effusion. Mediastinum/Nodes: Base of cervical region normal appearance. Esophagus unremarkable. 11 mm short axis AP window lymph node image 80. Multiple additional normal sized mediastinal lymph nodes. Fluid collection at the RIGHT paratracheal region 24 x 18 mm likely small nonspecific cyst, less likely fluid within a superior pericardial recess. Lungs/Pleura: Partially loculated large LEFT pleural effusion. Complete atelectasis LEFT lower lobe with subtotal atelectasis of LEFT upper lobe. Fluid-filled lower lobe airways.  Small RIGHT pleural effusion and minimal basilar atelectasis. Upper Abdomen: Liver, spleen, adrenal glands, pancreas, and gallbladder unremarkable. Small cysts at upper pole pole of RIGHT kidney. No acute upper abdominal findings. Musculoskeletal: Advanced degenerative changes BILATERAL glenohumeral joints and LEFT sternoclavicular joint. Advanced degenerative changes of the thoracic spine. IMPRESSION: Large partially loculated LEFT pleural effusion with complete atelectasis of LEFT lower lobe and subtotal atelectasis of LEFT upper lobe. Small RIGHT pleural effusion and minimal basilar atelectasis. Nonspecific mildly enlarged AP window lymph node. Extensive atherosclerotic disease changes including coronary arteries. Aortic Atherosclerosis (ICD10-I70.0). Electronically Signed   By: Ulyses Southward M.D.   On: 03/07/2021  12:34    Normal sinus rhythm Right bundle branch block Left anterior fascicular block Bifascicular block  Assessment/Plan Present on Admission:  Recurrent left pleural effusion  Principal Problem:   Recurrent left pleural effusion #1 left atelectasis complete well adult. EDP contacted IR for thoracentesis and tube placement.  We will waiting for that he is currently oxygenating well on 2 L/min of oxygen  2.  Urinary tract infection.  Patient had a fever His UA is abnormal I will continue him on Rocephin and Zithromax  3.  Leukocytosis of 33.  Given his subtotal atelectasis on the left which could not exclude airspace disease or pneumonia he was started on azithromycin and ceftriaxone which we will continue patient did have fever prior to admission and he still has low-grade fever        4.  Spinal stenosis with  paraplegia. Patient is completely bedbound since October 2021  5.  Altered mental status this might be transient patient does work-up we will monitor if he continues may need to have a CT scan of his head his electrolytes are normal he does have a low-grade fever his EKG  is normal  6.  Nonischemic cardiomyopathy.  His EKG is normal he does not seem to be felt overloaded.  7.  Hyponatremia of 125 we will start gentle hydration with normal saline  8.  Dehydration.  As he is noted to have decreased urine output  he has indwelling Foley catheter will monitor intake and output  I have started him on IV hydration with normal saline this will also help correct his hyponatremia  9.  Hypotension patient had low blood pressure at home currently his blood pressure is still soft IV fluid has been started  Severity of Illness: The appropriate patient status for this patient is INPATIENT. Inpatient status is judged to be reasonable and necessary in order to provide the required intensity of service to ensure the patient's safety. The patient's presenting symptoms, physical exam findings, and initial radiographic and laboratory data in the context of their chronic comorbidities is felt to place them at high risk for further clinical deterioration. Furthermore, it is not anticipated that the patient will be medically stable for discharge from the hospital within 2 midnights of admission.  Shortness of breath with hypoxia and hypotension also large left pleural effusion. * I certify that at the point of admission it is my clinical judgment that the patient will require inpatient hospital care spanning beyond 2 midnights from the point of admission due to high intensity of service, high risk for further deterioration and high frequency of surveillance required.*   DVT prophylaxis: Holding of due to paracentesis expected paracentesis  Code Status: DNR  Family Communication: Wife Myra at bedside  Disposition Plan: To be determined  Consults called: None  Admission status: Inpatient    Cristal Deer MD [facility] Pager [phone]  If 7PM-7AM, please contact night-coverage [email or web address] Password [facility]  03/07/2021, 2:45 PM

## 2021-03-07 NOTE — ED Triage Notes (Signed)
Pt wife reports pt blood pressure has been low and his oxygen sats have been low as well and pt has been really weak. Pt was recently admitted for angioedema

## 2021-03-07 NOTE — Progress Notes (Signed)
PHARMACIST - PHYSICIAN COMMUNICATION  CONCERNING:  Enoxaparin (Lovenox) for DVT Prophylaxis    RECOMMENDATION: Patient was prescribed enoxaparin 40mg  q24 hours for VTE prophylaxis.   Filed Weights   03/07/21 1036  Weight: 103.9 kg (229 lb)    Body mass index is 31.06 kg/m.  Estimated Creatinine Clearance: 82.8 mL/min (by C-G formula based on SCr of 0.96 mg/dL).   Based on [facility] policy patient is candidate for enoxaparin 0.5mg /kg TBW SQ every 24 hours based on BMI being >30.  DESCRIPTION: Pharmacy has adjusted enoxaparin dose per [facility] policy.  Patient is now receiving enoxaparin 52.5 mg every 24 hours    [facility] 03/07/2021 3:03 PM

## 2021-03-07 NOTE — ED Notes (Signed)
Pt taken for CT scan 

## 2021-03-08 ENCOUNTER — Encounter: Payer: Self-pay | Admitting: Family Medicine

## 2021-03-08 LAB — BASIC METABOLIC PANEL
Anion gap: 9 (ref 5–15)
BUN: 39 mg/dL — ABNORMAL HIGH (ref 8–23)
CO2: 26 mmol/L (ref 22–32)
Calcium: 8.9 mg/dL (ref 8.9–10.3)
Chloride: 92 mmol/L — ABNORMAL LOW (ref 98–111)
Creatinine, Ser: 1 mg/dL (ref 0.61–1.24)
GFR, Estimated: >60 mL/min (ref >60)
Glucose, Bld: 73 mg/dL (ref 70–99)
Potassium: 4.7 mmol/L (ref 3.5–5.1)
Sodium: 127 mmol/L — ABNORMAL LOW (ref 135–145)

## 2021-03-08 LAB — PROCALCITONIN: Procalcitonin: 0.61 ng/mL

## 2021-03-08 LAB — GLUCOSE, CAPILLARY
Glucose-Capillary: 74 mg/dL (ref 70–99)
Glucose-Capillary: 102 mg/dL — ABNORMAL HIGH (ref 70–99)
Glucose-Capillary: 112 mg/dL — ABNORMAL HIGH (ref 70–99)

## 2021-03-08 LAB — CBC
HCT: 30.2 % — ABNORMAL LOW (ref 39.0–52.0)
Hemoglobin: 10.2 g/dL — ABNORMAL LOW (ref 13.0–17.0)
MCH: 30.9 pg (ref 26.0–34.0)
MCHC: 33.8 g/dL (ref 30.0–36.0)
MCV: 91.5 fL (ref 80.0–100.0)
Platelets: 396 10*3/uL (ref 150–400)
RBC: 3.3 MIL/uL — ABNORMAL LOW (ref 4.22–5.81)
RDW: 12.9 % (ref 11.5–15.5)
WBC: 33.1 10*3/uL — ABNORMAL HIGH (ref 4.0–10.5)
nRBC: 0 % (ref 0.0–0.2)

## 2021-03-08 NOTE — Progress Notes (Signed)
Air mattress ordered per wife request as patient is bedbound.

## 2021-03-08 NOTE — Progress Notes (Signed)
PROGRESS NOTE    Roy [name].  HBZ:[id number] DOB: [birth year] DOA: 03/07/2021 PCP: Rigoberto Noel, MD  [phone]   Assessment & Plan:   Principal Problem:   Recurrent left pleural effusion   Roy [name]. is a 75 y.o. male with medical history significant for paraplegia secondary to spinal stenosis who is bedbound, angioedema from ACE inhibitor status post intubation recently a month ago recent pneumonia about 6 weeks ago, diabetes mellitus, coronary artery disease, nonischemic cardiomyopathy who presented to the emergency department weeks hypotension with syncope shortness of breath and weakness.  He stated that his blood pressure was 62/42 and oxygen was 78 to 81% yesterday but patient refused to go to the ER when EMS was called,  today he presented to the emergency department due to worsening shortness of breath.  History was mostly obtained from wife who is at bedside.  She said she is a retired Scientist, clinical (histocompatibility and immunogenetics).   Large left pleural effusion with partial loculation Subtotal atelectasis of LEFT lung --IR for either thoracentesis or chest tube placement on Monday.  Likely left PNA --WBC 32.9, procal 0.61.   --cont ceftriaxone and azithromycin  Acute hypoxemic respiratory failure --cont monitoring showed O2 sats in mid 80's, pt needed 2-3L [place]. --Continue supplemental O2 to keep sats >=90%, wean as tolerated  Urinary tract infection, ruled out --denied dysuria or suprapubic tenderness.  UA obtained from the Foley bag.  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 --cont home Foley --cont Flomax  Spinal stenosis with  paraplegia. Patient is completely bedbound since October 2021  Nonischemic cardiomyopathy.   --per wife, EF used to be 17%, but has improved to 60-65% a year ago.  Not on diuretic at home. --cont home coreg  Hyponatremia --Na of 125  --d/c MIVF and allow oral hydration  DM2 --recent A1c 6.1, too  low for his age --BG so far have been low --d/c long-acting insulin --ACHS BG checks and SSI TID for now   DVT prophylaxis: Lovenox SQ Code Status: DNR  Family Communication: wife updated at bedside today Level of care: Med-Surg Dispo:   The patient is from: home Anticipated d/c is to: undetermined Anticipated d/c date is: > 3 days Patient currently is not medically ready to d/c due to: pending chest tube placement   Subjective and Interval History:  Pt denied dyspnea.  Wife reported pt having poor oral  intake recently.   Objective: Vitals:   03/08/21 1000 03/08/21 1030 03/08/21 1243 03/08/21 1555  BP: 117/77 109/67 102/64 (!) 115/49  Pulse: 75 84 82 81  Resp: (!) 27 (!) 22 16 16   Temp:   99.3 F (37.4 C) 99.6 F (37.6 C)  TempSrc:   Oral Oral  SpO2: 95% 96% 95% 95%  Weight:      Height:        Intake/Output Summary (Last 24 hours) at 03/08/2021 1619 Last data filed at 03/08/2021 03/10/2021 Gross per 24 hour  Intake 250 ml  Output 800 ml  Net -550 ml   Filed Weights   03/07/21 1036  Weight: 103.9 kg    Examination:   Constitutional: NAD, AAOx3 HEENT: conjunctivae and lids normal, EOMI CV: No cyanosis.   RESP: normal respiratory effort, no lung sound on the left, on 2L Extremities: Mild edema in BLE with mild venous stasis changes SKIN: warm, dry   Data Reviewed: I have personally reviewed following labs and imaging studies  CBC: Recent Labs  Lab  03/07/21 1044 03/08/21 0559  WBC 32.9* 33.1*  HGB 10.8* 10.2*  HCT 32.6* 30.2*  MCV 93.4 91.5  PLT 358 396   Basic Metabolic Panel: Recent Labs  Lab 03/07/21 1044 03/08/21 0559  NA 125* 127*  K 4.5 4.7  CL 89* 92*  CO2 28 26  GLUCOSE 74 73  BUN 34* 39*  CREATININE 0.96 1.00  CALCIUM 8.8* 8.9   GFR: Estimated Creatinine Clearance: 79.5 mL/min (by C-G formula based on SCr of 1 mg/dL). Liver Function Tests: No results for input(s): AST, ALT, ALKPHOS, BILITOT, PROT, ALBUMIN in the last 168  hours. No results for input(s): LIPASE, AMYLASE in the last 168 hours. No results for input(s): AMMONIA in the last 168 hours. Coagulation Profile: No results for input(s): INR, PROTIME in the last 168 hours. Cardiac Enzymes: No results for input(s): CKTOTAL, CKMB, CKMBINDEX, TROPONINI in the last 168 hours. BNP (last 3 results) No results for input(s): PROBNP in the last 8760 hours. HbA1C: No results for input(s): HGBA1C in the last 72 hours. CBG: Recent Labs  Lab 03/07/21 1603 03/07/21 2238 03/08/21 1314  GLUCAP 78 63* 74   Lipid Profile: No results for input(s): CHOL, HDL, LDLCALC, TRIG, CHOLHDL, LDLDIRECT in the last 72 hours. Thyroid Function Tests: No results for input(s): TSH, T4TOTAL, FREET4, T3FREE, THYROIDAB in the last 72 hours. Anemia Panel: No results for input(s): VITAMINB12, FOLATE, FERRITIN, TIBC, IRON, RETICCTPCT in the last 72 hours. Sepsis Labs: Recent Labs  Lab 03/08/21 0559  PROCALCITON 0.61    Recent Results (from the past 240 hour(s))  Resp Panel by RT-PCR (Flu A&B, Covid) Nasopharyngeal Swab     Status: None   Collection Time: 03/07/21 12:31 PM   Specimen: Nasopharyngeal Swab; Nasopharyngeal(NP) swabs in vial transport medium  Result Value Ref Range Status   SARS Coronavirus 2 by RT PCR NEGATIVE NEGATIVE Final    Comment: (NOTE) SARS-CoV-2 target nucleic acids are NOT DETECTED.  The SARS-CoV-2 RNA is generally detectable in upper respiratory specimens during the acute phase of infection. The lowest concentration of SARS-CoV-2 viral copies this assay can detect is 138 copies/mL. A negative result does not preclude SARS-Cov-2 infection and should not be used as the sole basis for treatment or other patient management decisions. A negative result may occur with  improper specimen collection/handling, submission of specimen other than nasopharyngeal swab, presence of viral mutation(s) within the areas targeted by this assay, and inadequate number  of viral copies(<138 copies/mL). A negative result must be combined with clinical observations, patient history, and epidemiological information. The expected result is Negative.  Fact Sheet for Patients:  BloggerCourse.com  Fact Sheet for Healthcare Providers:  SeriousBroker.it  This test is no t yet approved or cleared by the [place] FDA and  has been authorized for detection and/or diagnosis of SARS-CoV-2 by FDA under an Emergency Use Authorization (EUA). This EUA will remain  in effect (meaning this test can be used) for the duration of the COVID-19 declaration under Section 564(b)(1) of the Act, 21 U.S.C.section 360bbb-3(b)(1), unless the authorization is terminated  or revoked sooner.       Influenza A by PCR NEGATIVE NEGATIVE Final   Influenza B by PCR NEGATIVE NEGATIVE Final    Comment: (NOTE) The Xpert Xpress SARS-CoV-2/FLU/RSV plus assay is intended as an aid in the diagnosis of influenza from Nasopharyngeal swab specimens and should not be used as a sole basis for treatment. Nasal washings and aspirates are unacceptable for Xpert Xpress SARS-CoV-2/FLU/RSV testing.  Fact  Sheet for Patients: BloggerCourse.com  Fact Sheet for Healthcare Providers: SeriousBroker.it  This test is not yet approved or cleared by the [place] FDA and has been authorized for detection and/or diagnosis of SARS-CoV-2 by FDA under an Emergency Use Authorization (EUA). This EUA will remain in effect (meaning this test can be used) for the duration of the COVID-19 declaration under Section 564(b)(1) of the Act, 21 U.S.C. section 360bbb-3(b)(1), unless the authorization is terminated or revoked.  Performed at [facility], [address], [place], [place] [address]       Radiology Studies: DG Chest 2 View  Result Date: 03/07/2021 CLINICAL DATA:  Hypotension,  shortness of breath, weakness, angioedema, bundle branch block EXAM: CHEST - 2 VIEW COMPARISON:  01/09/2021 FINDINGS: Enlargement of cardiac silhouette. Large LEFT pleural effusion with subtotal atelectasis of LEFT lung. Underlying infiltrate and other abnormalities including tumor not excluded in this setting. Minimal atelectasis at RIGHT base. Remaining RIGHT lung clear. No pneumothorax or acute osseous findings. Osseous demineralization with degenerative changes of the glenohumeral joints and thoracic spine. IMPRESSION: Large LEFT pleural effusion with subtotal atelectasis of LEFT lung. Enlargement of cardiac silhouette with minimal RIGHT basilar atelectasis. Electronically Signed   By: Ulyses Southward M.D.   On: 03/07/2021 11:16   CT Chest W Contrast  Result Date: 03/07/2021 CLINICAL DATA:  Pleural effusion, hypotension, shortness of breath, weakness EXAM: CT CHEST WITH CONTRAST TECHNIQUE: Multidetector CT imaging of the chest was performed during intravenous contrast administration. CONTRAST:  88mL OMNIPAQUE IOHEXOL 300 MG/ML  SOLN IV COMPARISON:  Chest radiograph 03/07/2021 FINDINGS: Cardiovascular: Atherosclerotic calcifications aorta, coronary arteries, and proximal great vessels. 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 Mild enlargement of cardiac chambers. Minimal pericardial effusion. Mediastinum/Nodes: Base of cervical region normal appearance. Esophagus unremarkable. 11 mm short axis AP window lymph node image 80. Multiple additional normal sized mediastinal lymph nodes. Fluid collection at the RIGHT paratracheal region 24 x 18 mm likely small nonspecific cyst, less likely fluid within a superior pericardial recess. Lungs/Pleura: Partially loculated large LEFT pleural effusion. Complete atelectasis LEFT lower lobe with subtotal atelectasis of LEFT upper lobe. Fluid-filled lower lobe airways. Small RIGHT pleural effusion and minimal basilar atelectasis. Upper Abdomen: Liver,  spleen, adrenal glands, pancreas, and gallbladder unremarkable. Small cysts at upper pole pole of RIGHT kidney. No acute upper abdominal findings. Musculoskeletal: Advanced degenerative changes BILATERAL glenohumeral joints and LEFT sternoclavicular joint. Advanced degenerative changes of the thoracic spine. IMPRESSION: Large partially loculated LEFT pleural effusion with complete atelectasis of LEFT lower lobe and subtotal atelectasis of LEFT upper lobe. Small RIGHT pleural effusion and minimal basilar atelectasis. Nonspecific mildly enlarged AP window lymph node. Extensive atherosclerotic disease changes including coronary arteries. Aortic Atherosclerosis (ICD10-I70.0). Electronically Signed   By: Ulyses Southward M.D.   On: 03/07/2021 12:34     Scheduled Meds:  baclofen  5 mg Oral TID   brimonidine  1 drop Right Eye BID   carvedilol  6.25 mg Oral BID   enoxaparin (LOVENOX) injection  0.5 mg/kg Subcutaneous Q24H   famotidine  20 mg Oral QPM   gabapentin  300 mg Oral QHS   insulin aspart  0-15 Units Subcutaneous TID WC   insulin detemir  12 Units Subcutaneous QHS   latanoprost  1 drop Right Eye QHS   metFORMIN  500 mg Oral BID WC   simvastatin  20 mg Oral q1800   tamsulosin  0.4 mg Oral QPC supper   cyanocobalamin  2,000 mcg Oral Daily  Continuous Infusions:  azithromycin 500 mg (03/08/21 1307)   cefTRIAXone (ROCEPHIN)  IV Stopped (03/08/21 1251)     LOS: 1 day     Darlin Priestly, MD [facility] If 7PM-7AM, please contact night-coverage 03/08/2021, 4:19 PM

## 2021-03-08 NOTE — Plan of Care (Signed)

## 2021-03-08 NOTE — ED Notes (Signed)
Wife states that she would not like his CBG checked d/t blood glucose being 73 per the BMP drawn this AM

## 2021-03-09 ENCOUNTER — Inpatient Hospital Stay: Payer: Medicare HMO

## 2021-03-09 LAB — CBC
HCT: 31 % — ABNORMAL LOW (ref 39.0–52.0)
Hemoglobin: 10.3 g/dL — ABNORMAL LOW (ref 13.0–17.0)
MCH: 30.3 pg (ref 26.0–34.0)
MCHC: 33.2 g/dL (ref 30.0–36.0)
MCV: 91.2 fL (ref 80.0–100.0)
Platelets: 362 10*3/uL (ref 150–400)
RBC: 3.4 MIL/uL — ABNORMAL LOW (ref 4.22–5.81)
RDW: 12.8 % (ref 11.5–15.5)
WBC: 28.2 10*3/uL — ABNORMAL HIGH (ref 4.0–10.5)
nRBC: 0 % (ref 0.0–0.2)

## 2021-03-09 LAB — BODY FLUID CELL COUNT WITH DIFFERENTIAL
Eos, Fluid: 0 %
Lymphs, Fluid: 0 %
Monocyte-Macrophage-Serous Fluid: 5 %
Neutrophil Count, Fluid: 95 %
Other Cells, Fluid: 0 %
Total Nucleated Cell Count, Fluid: 1875 cu mm

## 2021-03-09 LAB — GLUCOSE, CAPILLARY
Glucose-Capillary: 132 mg/dL — ABNORMAL HIGH (ref 70–99)
Glucose-Capillary: 101 mg/dL — ABNORMAL HIGH (ref 70–99)
Glucose-Capillary: 97 mg/dL (ref 70–99)

## 2021-03-09 LAB — BASIC METABOLIC PANEL
Anion gap: 8 (ref 5–15)
BUN: 47 mg/dL — ABNORMAL HIGH (ref 8–23)
CO2: 27 mmol/L (ref 22–32)
Calcium: 9.1 mg/dL (ref 8.9–10.3)
Chloride: 94 mmol/L — ABNORMAL LOW (ref 98–111)
Creatinine, Ser: 1.01 mg/dL (ref 0.61–1.24)
GFR, Estimated: >60 mL/min (ref >60)
Glucose, Bld: 121 mg/dL — ABNORMAL HIGH (ref 70–99)
Potassium: 4.6 mmol/L (ref 3.5–5.1)
Sodium: 129 mmol/L — ABNORMAL LOW (ref 135–145)

## 2021-03-09 LAB — GLUCOSE, PLEURAL OR PERITONEAL FLUID: Glucose, Fluid: <20 mg/dL

## 2021-03-09 LAB — MAGNESIUM: Magnesium: 1.7 mg/dL (ref 1.7–2.4)

## 2021-03-09 LAB — AMYLASE, PLEURAL OR PERITONEAL FLUID: Amylase, Fluid: 18 U/L

## 2021-03-09 LAB — PROTEIN, PLEURAL OR PERITONEAL FLUID: Total protein, fluid: 4.2 g/dL

## 2021-03-09 IMAGING — CT CT PERC PLEURAL DRAIN W/INDWELL CATH W/IMG GUIDE
1 of 3 series · 15 of 32 positions shown, 19 images · non-contrast
Comparison: none

INDICATION: Large left pleural effusion

[Series 2: i-spiral 5.0 br38 · axial · 0.94mm/px · z∈[+1331,+1499]mm · 15 of 55 slices shown, 19 images]
[im 5/55  soft-tissue]
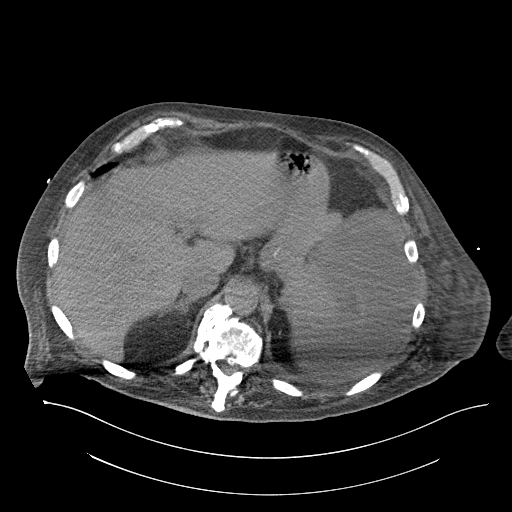
[im 5/55  bone]
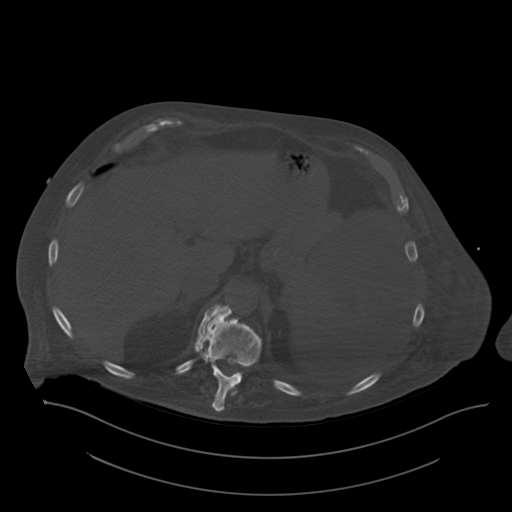
[im 9/55  soft-tissue]
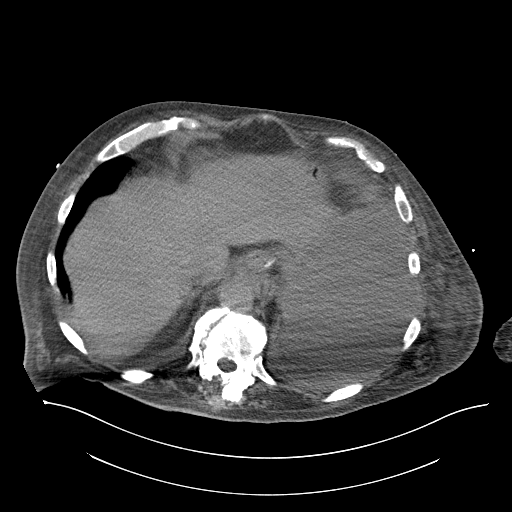
[im 13/55  soft-tissue]
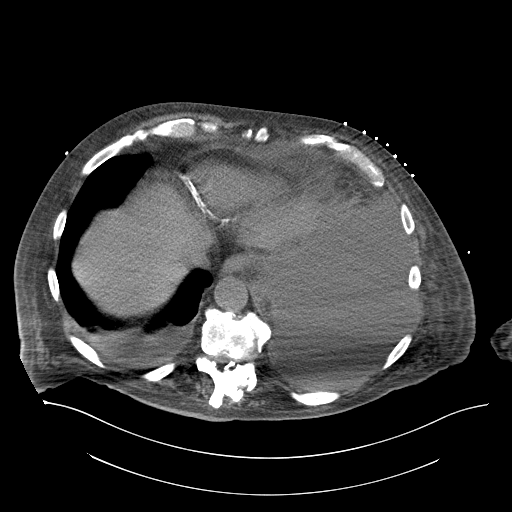
[im 17/55  soft-tissue]
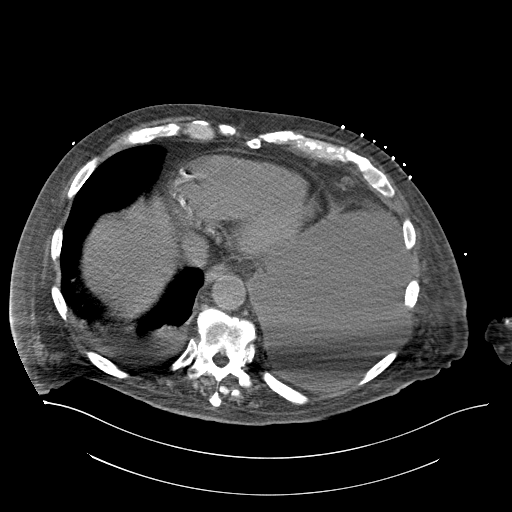
[im 21/55  soft-tissue]
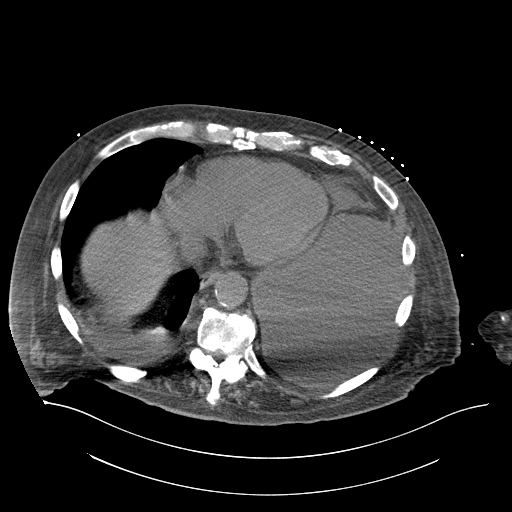
[im 25/55  soft-tissue]
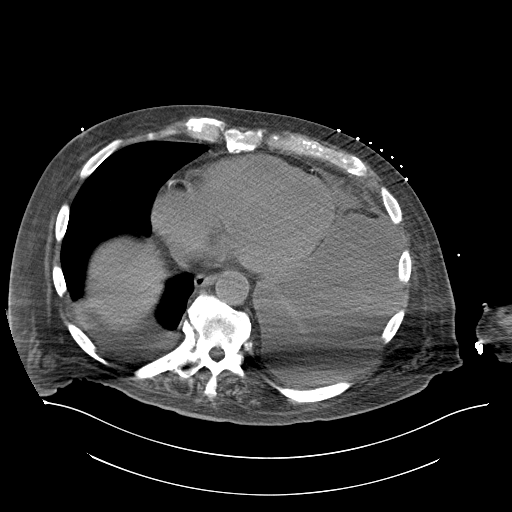
[im 29/55  soft-tissue]
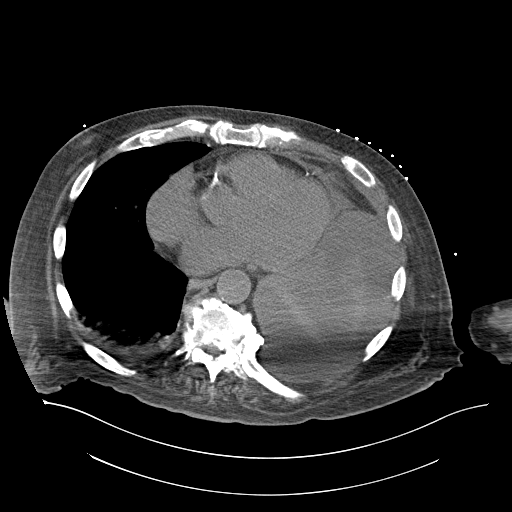
[im 33/55  soft-tissue]
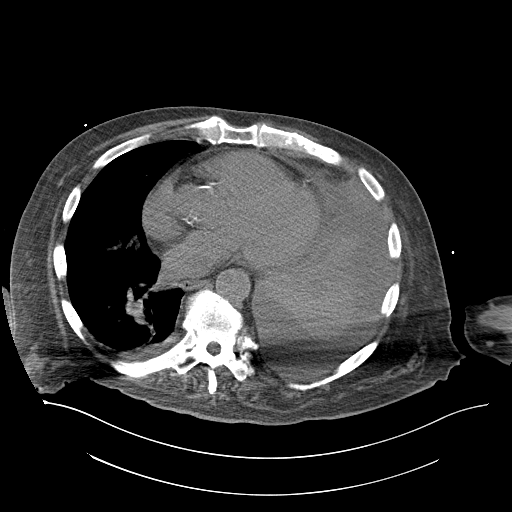
[im 37/55  soft-tissue]
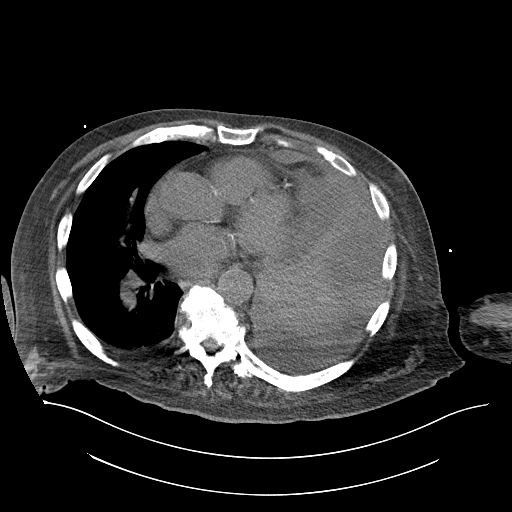
[im 37/55  bone]
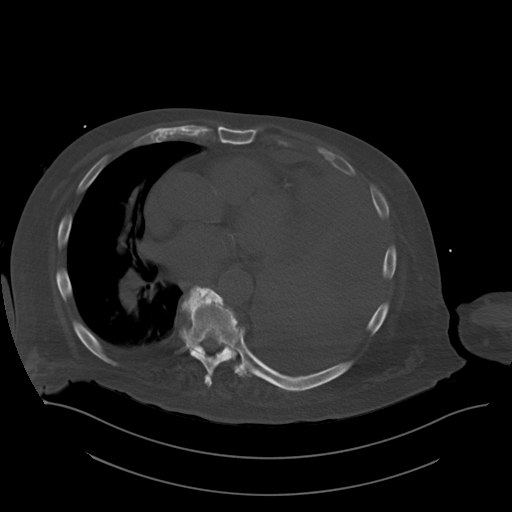
[im 41/55  soft-tissue]
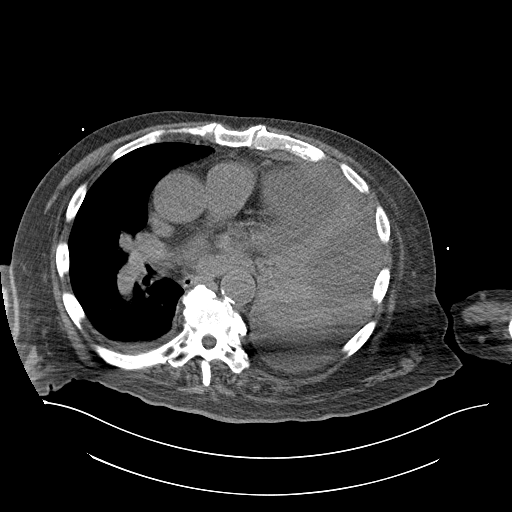
[im 45/55  soft-tissue]
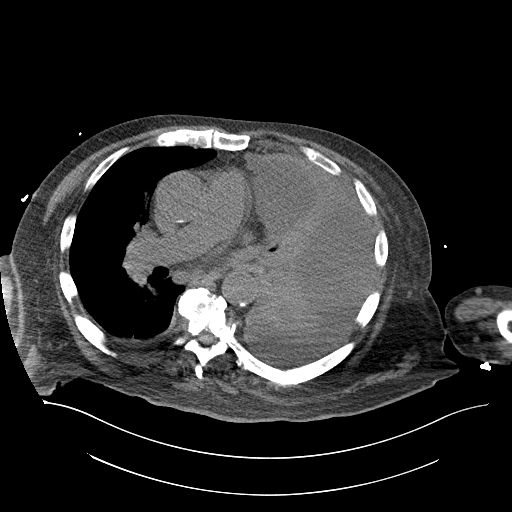
[im 47/55  lung]
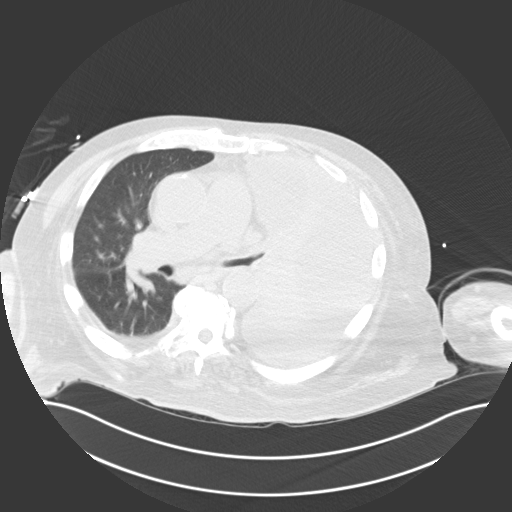
[im 49/55  soft-tissue]
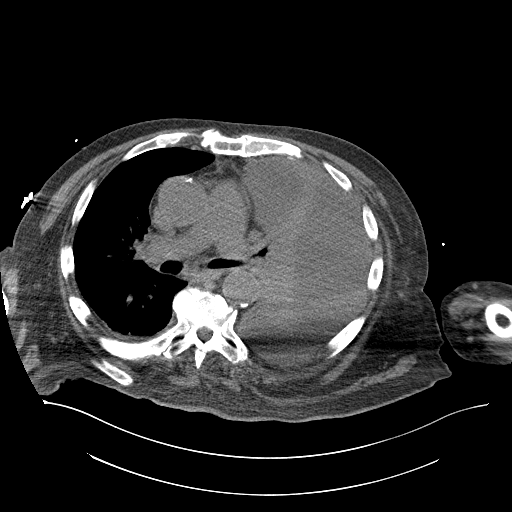
[im 49/55  lung]
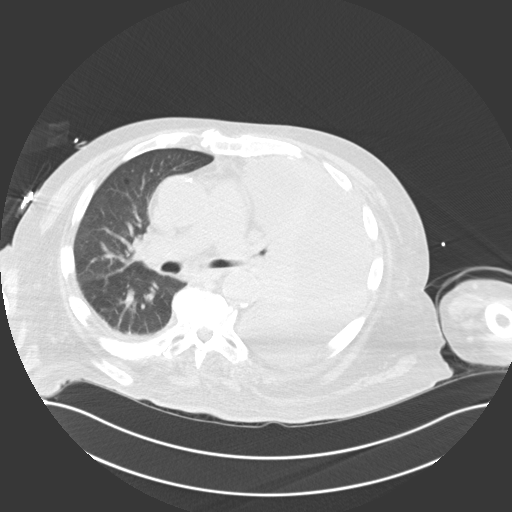
[im 51/55  lung]
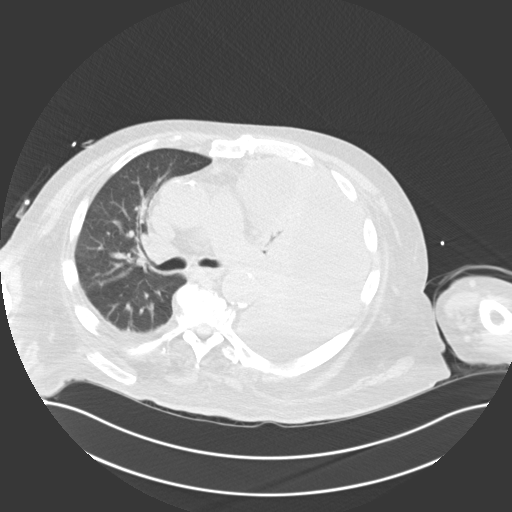
[im 53/55  soft-tissue]
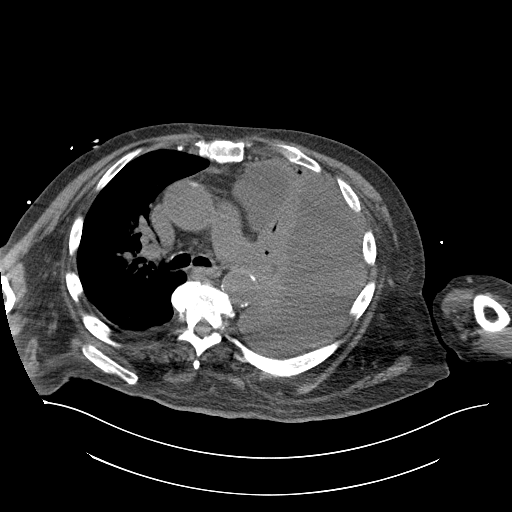
[im 53/55  lung]
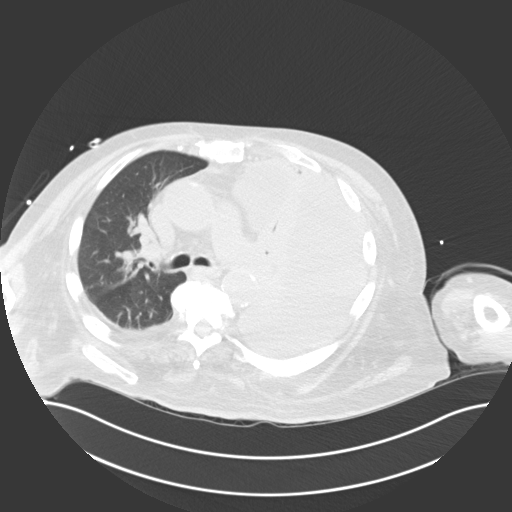

[15 of 32 positions shown; findings below may reference images not displayed]

EXAM:
CT-guided placement of left-sided chest tube

MEDICATIONS:
None

ANESTHESIA/SEDATION:
Anxiolysis and local analgesia only

COMPLICATIONS:
None immediate.

PROCEDURE:
Informed written consent was obtained from the patient after a
thorough discussion of the procedural risks, benefits and
alternatives. All questions were addressed. Maximal Sterile Barrier
Technique was utilized including caps, mask, sterile gowns, sterile
gloves, sterile drape, hand hygiene and skin antiseptic. A timeout
was performed prior to the initiation of the procedure.

The patient was placed supine on the exam table. Limited CT of the
chest was performed for planning purposes. This again demonstrated a
large left pleural effusion. Skin entry site was marked, and the
overlying skin was prepped and draped in a standard sterile fashion.
Local analgesia was obtained with 1% lidocaine. Under intermittent
CT fluoroscopy, a 10 French locking multipurpose drainage catheter
was advanced into the left pleural fluid using trocar technique.
Location was confirmed with CT imaging and return of clear, serous
fluid. A sample was collected and sent to the lab for analysis. The
catheter was secured to the skin using silk suture and a dressing.
It was attached to pleura vac drainage system connected to low
continuous suction. The patient tolerated the procedure well without
immediate complication.
IMPRESSION: 1. Successful CT-guided placement of a 10 French locking
multipurpose drainage catheter in the left pleural space.
2. Left pleural drainage catheter attached to Pleur-evac drainage
system to low continuous suction. Further recommendations per
pulmonology/thoracic surgery. Please call IR with any further
questions.

## 2021-03-09 MED ORDER — MIDAZOLAM HCL 2 MG/2ML IJ SOLN
INTRAMUSCULAR | Status: AC | PRN
  Administered 2021-03-09: .5 mg via INTRAVENOUS

## 2021-03-09 MED ORDER — MIDAZOLAM HCL 2 MG/2ML IJ SOLN
INTRAMUSCULAR | Status: AC
  Filled 2021-03-09: qty 4

## 2021-03-09 MED ORDER — SODIUM CHLORIDE 0.9 % IV SOLN
INTRAVENOUS | Status: AC | PRN
  Administered 2021-03-09: 10 mL/h via INTRAVENOUS

## 2021-03-09 MED ORDER — FENTANYL CITRATE (PF) 100 MCG/2ML IJ SOLN
INTRAMUSCULAR | Status: AC
  Filled 2021-03-09: qty 4

## 2021-03-09 NOTE — Procedures (Signed)
Interventional Radiology Procedure Note  Date of Procedure: 03/09/2021  Procedure: CT chest tube placement   Findings:  1. CT chest tube placement, 10 Fr locking pigtail, left    Complications: No immediate complications noted.   Estimated Blood Loss: minimal  Follow-up and Recommendations: 1. Keep to low continuous suction  2. Further management per pulmonary/thoracic surgery    Olive Bass, MD  Vascular & Interventional Radiology  03/09/2021 4:32 PM

## 2021-03-09 NOTE — Sedation Documentation (Signed)
Report called to unit nurse Vernona Rieger, patient received 0.5 mg versed. Returning pt directly to room. Connected to low suction for 10 minutes, 300 ml clear yellow drainage noted in [facility] chest tube drainage system. Iv fluids discontinued post procedure

## 2021-03-09 NOTE — Progress Notes (Signed)
PROGRESS NOTE    Roy [name].  PFY:[id number] DOB: [birth year] DOA: 03/07/2021 PCP: Rigoberto Noel, MD  [phone]   Assessment & Plan:   Principal Problem:   Recurrent left pleural effusion   Roy [name]. is a 75 y.o. male with medical history significant for paraplegia secondary to spinal stenosis who is bedbound, angioedema from ACE inhibitor status post intubation recently a month ago recent pneumonia about 6 weeks ago, diabetes mellitus, coronary artery disease, nonischemic cardiomyopathy who presented to the emergency department weeks hypotension with syncope shortness of breath and weakness.  He stated that his blood pressure was 62/42 and oxygen was 78 to 81% yesterday but patient refused to go to the ER when EMS was called,  today he presented to the emergency department due to worsening shortness of breath.  History was mostly obtained from wife who is at bedside.  She said she is a retired Scientist, clinical (histocompatibility and immunogenetics).   Large left pleural effusion with partial loculation Subtotal atelectasis of LEFT lung --IR for chest tube placement today --pleural fluid labs --Pulm consult today for management of chest tube  Likely left PNA --WBC 32.9, procal 0.61.   --cont ceftriaxone and azithro  Acute hypoxemic respiratory failure --tele in the ED showed O2 sats in mid 80's, pt needed 2-3L [place]. --Continue supplemental O2 to keep sats >=90%, wean as tolerated  Urinary tract infection, ruled out --denied dysuria or suprapubic tenderness.  UA obtained from the Foley bag.  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 --cont home Foley --cont Flomax  Spinal stenosis with  paraplegia. Patient is completely bedbound since October 2021  Nonischemic cardiomyopathy.   --per wife, EF used to be 17%, but has improved to 60-65% a year ago.  Not on diuretic at home. --cont home coreg  Hyponatremia --Na of 125 on presentation.   --oral  hydration.  DM2 --recent A1c 6.1, too low for his age --BG so far have been wnl --d/c fingersticks and insulin   DVT prophylaxis: Lovenox SQ Code Status: DNR  Family Communication: wife and son updated at bedside today Level of care: Med-Surg Dispo:   The patient is from: home Anticipated d/c is to: undetermined Anticipated d/c date is: > 3 days Patient currently is not medically ready to d/c due to: just received chest tube placement   Subjective and Interval History:  Per wife, pt was alert and ate a good breakfast this morning.  Pt denied any complaints.    Went for chest tube placement with IR this afternoon.   Objective: Vitals:   03/08/21 1555 03/08/21 2010 03/09/21 0515 03/09/21 0759  BP: (!) 115/49 108/75 (!) 108/51 110/70  Pulse: 81 77 72 72  Resp: 16 18 20 18   Temp: 99.6 F (37.6 C) 98.3 F (36.8 C) 97.9 F (36.6 C) 97.9 F (36.6 C)  TempSrc: Oral Oral Oral Oral  SpO2: 95% 93% 96% 94%  Weight:      Height:        Intake/Output Summary (Last 24 hours) at 03/09/2021 1525 Last data filed at 03/09/2021 1024 Gross per 24 hour  Intake 240 ml  Output 0 ml  Net 240 ml   Filed Weights   03/07/21 1036  Weight: 103.9 kg    Examination:   Constitutional: NAD, sleepy but arousable, oriented CV: No cyanosis.   RESP: normal respiratory effort, on 2L Extremities: Mild edema in BLE with venous stasis changes SKIN: warm, dry Neuro: II - XII  grossly intact.   Psych: Normal mood and affect.     Data Reviewed: I have personally reviewed following labs and imaging studies  CBC: Recent Labs  Lab 03/07/21 1044 03/08/21 0559 03/09/21 0441  WBC 32.9* 33.1* 28.2*  HGB 10.8* 10.2* 10.3*  HCT 32.6* 30.2* 31.0*  MCV 93.4 91.5 91.2  PLT 358 396 362   Basic Metabolic Panel: Recent Labs  Lab 03/07/21 1044 03/08/21 0559 03/09/21 0441  NA 125* 127* 129*  K 4.5 4.7 4.6  CL 89* 92* 94*  CO2 28 26 27   GLUCOSE 74 73 121*  BUN 34* 39* 47*  CREATININE 0.96  1.00 1.01  CALCIUM 8.8* 8.9 9.1  MG  --   --  1.7   GFR: Estimated Creatinine Clearance: 78.7 mL/min (by C-G formula based on SCr of 1.01 mg/dL). Liver Function Tests: No results for input(s): AST, ALT, ALKPHOS, BILITOT, PROT, ALBUMIN in the last 168 hours. No results for input(s): LIPASE, AMYLASE in the last 168 hours. No results for input(s): AMMONIA in the last 168 hours. Coagulation Profile: No results for input(s): INR, PROTIME in the last 168 hours. Cardiac Enzymes: No results for input(s): CKTOTAL, CKMB, CKMBINDEX, TROPONINI in the last 168 hours. BNP (last 3 results) No results for input(s): PROBNP in the last 8760 hours. HbA1C: No results for input(s): HGBA1C in the last 72 hours. CBG: Recent Labs  Lab 03/08/21 1314 03/08/21 1649 03/08/21 2239 03/09/21 0803 03/09/21 1110  GLUCAP 74 102* 112* 132* 101*   Lipid Profile: No results for input(s): CHOL, HDL, LDLCALC, TRIG, CHOLHDL, LDLDIRECT in the last 72 hours. Thyroid Function Tests: No results for input(s): TSH, T4TOTAL, FREET4, T3FREE, THYROIDAB in the last 72 hours. Anemia Panel: No results for input(s): VITAMINB12, FOLATE, FERRITIN, TIBC, IRON, RETICCTPCT in the last 72 hours. Sepsis Labs: Recent Labs  Lab 03/08/21 0559  PROCALCITON 0.61    Recent Results (from the past 240 hour(s))  Resp Panel by RT-PCR (Flu A&B, Covid) Nasopharyngeal Swab     Status: None   Collection Time: 03/07/21 12:31 PM   Specimen: Nasopharyngeal Swab; Nasopharyngeal(NP) swabs in vial transport medium  Result Value Ref Range Status   SARS Coronavirus 2 by RT PCR NEGATIVE NEGATIVE Final    Comment: (NOTE) SARS-CoV-2 target nucleic acids are NOT DETECTED.  The SARS-CoV-2 RNA is generally detectable in upper respiratory specimens during the acute phase of infection. The lowest concentration of SARS-CoV-2 viral copies this assay can detect is 138 copies/mL. A negative result does not preclude SARS-Cov-2 infection and should not be  used as the sole basis for treatment or other patient management decisions. A negative result may occur with  improper specimen collection/handling, submission of specimen other than nasopharyngeal swab, presence of viral mutation(s) within the areas targeted by this assay, and inadequate number of viral copies(<138 copies/mL). A negative result must be combined with clinical observations, patient history, and epidemiological information. The expected result is Negative.  Fact Sheet for Patients:  03/09/21  Fact Sheet for Healthcare Providers:  BloggerCourse.com  This test is no t yet approved or cleared by the [place] FDA and  has been authorized for detection and/or diagnosis of SARS-CoV-2 by FDA under an Emergency Use Authorization (EUA). This EUA will remain  in effect (meaning this test can be used) for the duration of the COVID-19 declaration under Section 564(b)(1) of the Act, 21 U.S.C.section 360bbb-3(b)(1), unless the authorization is terminated  or revoked sooner.       Influenza A by  PCR NEGATIVE NEGATIVE Final   Influenza B by PCR NEGATIVE NEGATIVE Final    Comment: (NOTE) The Xpert Xpress SARS-CoV-2/FLU/RSV plus assay is intended as an aid in the diagnosis of influenza from Nasopharyngeal swab specimens and should not be used as a sole basis for treatment. Nasal washings and aspirates are unacceptable for Xpert Xpress SARS-CoV-2/FLU/RSV testing.  Fact Sheet for Patients: BloggerCourse.com  Fact Sheet for Healthcare Providers: SeriousBroker.it  This test is not yet approved or cleared by the [place] FDA and has been authorized for detection and/or diagnosis of SARS-CoV-2 by FDA under an Emergency Use Authorization (EUA). This EUA will remain in effect (meaning this test can be used) for the duration of the COVID-19 declaration under Section  564(b)(1) of the Act, 21 U.S.C. section 360bbb-3(b)(1), unless the authorization is terminated or revoked.  Performed at [facility], [address], [place], [place] [address]       Radiology Studies: No results found.   Scheduled Meds:  baclofen  5 mg Oral TID   brimonidine  1 drop Right Eye BID   carvedilol  6.25 mg Oral BID   enoxaparin (LOVENOX) injection  0.5 mg/kg Subcutaneous Q24H   famotidine  20 mg Oral QPM   fentaNYL       gabapentin  300 mg Oral QHS   insulin aspart  0-15 Units Subcutaneous TID WC   latanoprost  1 drop Right Eye QHS   midazolam       simvastatin  20 mg Oral q1800   tamsulosin  0.4 mg Oral QPC supper   cyanocobalamin  2,000 mcg Oral Daily   Continuous Infusions:  azithromycin 500 mg (03/09/21 1304)   cefTRIAXone (ROCEPHIN)  IV 1 g (03/09/21 1026)     LOS: 2 days     Roy Priestly, MD [facility] If 7PM-7AM, please contact night-coverage 03/09/2021, 3:25 PM

## 2021-03-09 NOTE — Consult Note (Signed)
Chief Complaint: Left sided pleural effusion. Request is for left sided chest tube placement  Referring Physician(s): Dr. Laurette Schimke  Supervising Physician: Pernell Dupre  Patient Status: [facility] - In-pt  History of Present Illness: Roy [name]. is a 75 y.o. male History of paraplegia secondary to spinal stenosis,DM, CAD, nonischemic cardiomyopathy,  recently admitted and intubated due to ACE inhibitor induced angioedema. Recent PNA.  Presented to the ED with persistent and worsening SHOB. Found to have a lege left sided partially loculated pleural effusion. CT chest from 11.26.22 reads Large partially loculated LEFT pleural effusion with complete atelectasis of LEFT lower lobe and subtotal atelectasis of LEFT upper lobe. Team is requesting thoracentesis or chest tube. After review of case by IR Attending Dr. Jeannie Fend. El-Abd who recommends left sided chest tube. Team is in agreement with the plan of care. Pulmonology has been consulted and has agreed to mange the chest tube after placement.  Currently without any significant complaints. Patient alert and laying in bed, calm and comfortable. Wife is at bedside. Denies any fevers, headache, chest pain, SOB, cough, abdominal pain, nausea, vomiting or bleeding. Per wife he can move his hands and has some passive range of motion with his arms but cannot go above his head., Return precautions and treatment recommendations and follow-up discussed with the patient and his wife who are both agreeable with the plan.   Past Medical History:  Diagnosis Date   Bedbound    Bilateral shoulder pain    CHF (congestive heart failure) (HCC)    Coronary artery disease    Diabetes mellitus without complication (HCC)    GERD (gastroesophageal reflux disease)    Glaucoma    HLD (hyperlipidemia)    Hypertension    Lumbar stenosis    Non-ischemic cardiomyopathy (HCC)    PVC (premature ventricular contraction)    Sleep apnea    H/O NO CPAP IN 18 YRS     Past Surgical History:  Procedure Laterality Date   ANTERIOR CERVICAL DECOMP/DISCECTOMY FUSION N/A 01/29/2020   Procedure: ANTERIOR CERVICAL DECOMPRESSION/DISCECTOMY FUSION 1 LEVEL C3/4;  Surgeon: [name], MD;  Location: [facility] ORS;  Service: Neurosurgery;  Laterality: N/A;   arm surgery Right    4x surgery as a child, cut arm falling through glass window    HYDROCELE EXCISION / REPAIR     POSTERIOR CERVICAL FUSION/FORAMINOTOMY N/A 05/06/2020   Procedure: C3-4 LAMINECTOMY & C3-4 FUSION;  Surgeon: [name], MD;  Location: [facility] ORS;  Service: Neurosurgery;  Laterality: N/A;   REPLACEMENT TOTAL KNEE Right     Allergies: Ace inhibitors and Lisinopril  Medications: Prior to Admission medications   Medication Sig Start Date End Date Taking? Authorizing Provider  acetaminophen (TYLENOL) 500 MG tablet Take 2 tablets (1,000 mg total) by mouth every 6 (six) hours. Patient taking differently: Take 1,000 mg by mouth in the morning and at bedtime. 05/08/20  Yes Venetia Night, MD  amLODipine (NORVASC) 10 MG tablet Take 10 mg by mouth in the morning.   Yes [provider]  aspirin 81 MG chewable tablet Chew 81 mg by mouth daily.   Yes [provider]  baclofen (LIORESAL) 10 MG tablet Take 5 mg by mouth 3 (three) times daily.   Yes [provider]  brimonidine (ALPHAGAN) 0.2 % ophthalmic solution Place 1 drop into the right eye in the morning and at bedtime. 11/21/20  Yes [provider]  calcium carbonate (TUMS - DOSED IN MG ELEMENTAL CALCIUM) 500 MG chewable  tablet Chew 1 tablet by mouth 2 (two) times daily as needed for indigestion or heartburn.   Yes [provider]  carvedilol (COREG) 6.25 MG tablet Take 6.25 mg by mouth 2 (two) times daily. 12/31/20  Yes [provider]  cyanocobalamin 2000 MCG tablet Take 2,000 mcg by mouth daily. 01/24/19  Yes [provider]  famotidine (PEPCID) 20 MG tablet Take 20 mg by mouth every  evening.   Yes [provider]  gabapentin (NEURONTIN) 300 MG capsule Take 300 mg by mouth at bedtime.   Yes [provider]  insulin aspart (NOVOLOG) 100 UNIT/ML injection Inject 4-8 Units into the skin 3 (three) times daily with meals. Sliding scale   Yes [provider]  insulin detemir (LEVEMIR FLEXTOUCH) 100 UNIT/ML FlexPen Inject 12 Units into the skin at bedtime. 01/11/21  Yes Wieting, Richard, MD  latanoprost (XALATAN) 0.005 % ophthalmic solution Place 1 drop into the right eye at bedtime. 08/20/19  Yes [provider]  meloxicam (MOBIC) 7.5 MG tablet Take 7.5 mg by mouth in the morning.   Yes [provider]  metFORMIN (GLUCOPHAGE-XR) 500 MG 24 hr tablet Take 500 mg by mouth 2 (two) times daily. 01/19/21  Yes [provider]  Multiple Vitamins-Minerals (CENTRUM SILVER 50+MEN PO) Take 1 tablet by mouth daily.   Yes [provider]  polyethylene glycol powder (GLYCOLAX/MIRALAX) 17 GM/SCOOP powder Take 0.5 Containers by mouth daily as needed for moderate constipation.   Yes [provider]  simvastatin (ZOCOR) 20 MG tablet Take 20 mg by mouth daily at 6 PM.   Yes [provider]  tamsulosin (FLOMAX) 0.4 MG CAPS capsule Take 1 capsule (0.4 mg total) by mouth daily after supper. 01/25/20  Yes Wouk, Wilfred Curtis, MD     History reviewed. No pertinent family history.  Social History   Socioeconomic History   Marital status: Married    Spouse name: Not on file   Number of children: Not on file   Years of education: Not on file   Highest education level: Not on file  Occupational History   Not on file  Tobacco Use   Smoking status: Never   Smokeless tobacco: Never  Vaping Use   Vaping Use: Never used  Substance and Sexual Activity   Alcohol use: No   Drug use: Never   Sexual activity: Not on file  Other Topics Concern   Not on file  Social History Narrative   Not on file   Social Determinants of  Health   Financial Resource Strain: Not on file  Food Insecurity: Not on file  Transportation Needs: Not on file  Physical Activity: Not on file  Stress: Not on file  Social Connections: Not on file    Review of Systems: A 12 point ROS discussed and pertinent positives are indicated in the HPI above.  All other systems are negative.  Review of Systems  Constitutional:  Negative for fever.  HENT:  Negative for congestion.   Respiratory:  Negative for cough and shortness of breath.   Cardiovascular:  Negative for chest pain.  Gastrointestinal:  Negative for abdominal pain.  Musculoskeletal:         Per wife he can move his hands and has some passive range of motion with his arms but cannot go above his head  Skin:  Positive for wound (skin tear to left forearm.).  Neurological:  Negative for headaches.  Psychiatric/Behavioral:  Negative for behavioral problems and confusion.  Vital Signs: BP 110/70 (BP Location: Left Arm)   Pulse 72   Temp 97.9 F (36.6 C) (Oral)   Resp 18   Ht 6' (1.829 m)   Wt 229 lb (103.9 kg)   SpO2 94%   BMI 31.06 kg/m   Physical Exam Vitals and nursing note reviewed.  Constitutional:      Appearance: He is ill-appearing.  HENT:     Head: Normocephalic.  Pulmonary:     Effort: Pulmonary effort is normal.     Breath sounds: Examination of the left-upper field reveals decreased breath sounds. Decreased breath sounds present.     Comments: On o2 via [place] Musculoskeletal:        General: Normal range of motion.     Cervical back: Normal range of motion.  Skin:    General: Skin is dry.  Neurological:     Mental Status: He is alert and oriented to person, place, and time.    Imaging: DG Chest 2 View  Result Date: 03/07/2021 CLINICAL DATA:  Hypotension, shortness of breath, weakness, angioedema, bundle branch block EXAM: CHEST - 2 VIEW COMPARISON:  01/09/2021 FINDINGS: Enlargement of cardiac silhouette. Large LEFT pleural effusion with subtotal  atelectasis of LEFT lung. Underlying infiltrate and other abnormalities including tumor not excluded in this setting. Minimal atelectasis at RIGHT base. Remaining RIGHT lung clear. No pneumothorax or acute osseous findings. Osseous demineralization with degenerative changes of the glenohumeral joints and thoracic spine. IMPRESSION: Large LEFT pleural effusion with subtotal atelectasis of LEFT lung. Enlargement of cardiac silhouette with minimal RIGHT basilar atelectasis. Electronically Signed   By: Ulyses Southward M.D.   On: 03/07/2021 11:16   CT Chest W Contrast  Result Date: 03/07/2021 CLINICAL DATA:  Pleural effusion, hypotension, shortness of breath, weakness EXAM: CT CHEST WITH CONTRAST TECHNIQUE: Multidetector CT imaging of the chest was performed during intravenous contrast administration. CONTRAST:  75mL OMNIPAQUE IOHEXOL 300 MG/ML  SOLN IV COMPARISON:  Chest radiograph 03/07/2021 FINDINGS: Cardiovascular: Atherosclerotic calcifications aorta, coronary arteries, and proximal great vessels. 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 Mild enlargement of cardiac chambers. Minimal pericardial effusion. Mediastinum/Nodes: Base of cervical region normal appearance. Esophagus unremarkable. 11 mm short axis AP window lymph node image 80. Multiple additional normal sized mediastinal lymph nodes. Fluid collection at the RIGHT paratracheal region 24 x 18 mm likely small nonspecific cyst, less likely fluid within a superior pericardial recess. Lungs/Pleura: Partially loculated large LEFT pleural effusion. Complete atelectasis LEFT lower lobe with subtotal atelectasis of LEFT upper lobe. Fluid-filled lower lobe airways. Small RIGHT pleural effusion and minimal basilar atelectasis. Upper Abdomen: Liver, spleen, adrenal glands, pancreas, and gallbladder unremarkable. Small cysts at upper pole pole of RIGHT kidney. No acute upper abdominal findings. Musculoskeletal: Advanced degenerative changes  BILATERAL glenohumeral joints and LEFT sternoclavicular joint. Advanced degenerative changes of the thoracic spine. IMPRESSION: Large partially loculated LEFT pleural effusion with complete atelectasis of LEFT lower lobe and subtotal atelectasis of LEFT upper lobe. Small RIGHT pleural effusion and minimal basilar atelectasis. Nonspecific mildly enlarged AP window lymph node. Extensive atherosclerotic disease changes including coronary arteries. Aortic Atherosclerosis (ICD10-I70.0). Electronically Signed   By: Ulyses Southward M.D.   On: 03/07/2021 12:34    Labs:  CBC: Recent Labs    01/11/21 0558 03/07/21 1044 03/08/21 0559 03/09/21 0441  WBC 9.4 32.9* 33.1* 28.2*  HGB 13.8 10.8* 10.2* 10.3*  HCT 40.0 32.6* 30.2* 31.0*  PLT 239 358 396 362    COAGS: Recent Labs    05/01/20  1400  INR 1.0  APTT 31    BMP: Recent Labs    01/11/21 0558 03/07/21 1044 03/08/21 0559 03/09/21 0441  NA 140 125* 127* 129*  K 3.9 4.5 4.7 4.6  CL 106 89* 92* 94*  CO2 29 28 26 27   GLUCOSE 178* 74 73 121*  BUN 34* 34* 39* 47*  CALCIUM 9.5 8.8* 8.9 9.1  CREATININE 0.74 0.96 1.00 1.01  GFRNONAA >60 >60 >60 >60    LIVER FUNCTION TESTS: Recent Labs    08/24/20 2051 01/08/21 0940  BILITOT 0.7 0.8  AST 18 12*  ALT 21 17  ALKPHOS 79 91  PROT 6.2* 6.8  ALBUMIN 3.3* 3.5     Assessment and Plan:  76 y.o. male. Inpatient. History of paraplegia secondary to spinal stenosis,DM, CAD, nonischemic cardiomyopathy,  recently admitted and intubated due to ACE inhibitor induced angioedema. Recent PNA.  Presented to the ED with persistent and worsening SHOB. Found to have a lege left sided partially loculated pleural effusion. CT chest from 11.26.22 reads Large partially loculated LEFT pleural effusion with complete atelectasis of LEFT lower lobe and subtotal atelectasis of LEFT upper lobe. Team is requesting thoracentesis or chest tube. After review of case by IR Attending Dr. Jeannie Fend. El-Abd who recommends left sided  chest tube. Team is in agreement with the plan of care. Pulmonology has been consulted and has agreed to mange the chest tube after placement.  WBC is 28.2, BUN 47, Sodium 129. Patient is on subcutaneous prophylactic dose of lovenox. Last dose on 11.27.22 @ 21:51. All other  labs and medications are within acceptable parameters. No pertinent allergies. Patient has been NPO since 0830.   Risks and benefits discussed with the patient including bleeding, infection, damage to adjacent structures, bowel perforation/fistula connection, and sepsis.  All of the patient's questions were answered, patient is agreeable to proceed. Consent signed and in chart.    Thank you for this interesting consult.  I greatly enjoyed meeting Yusuf Pasternak. and look forward to participating in their care.  A copy of this report was sent to the requesting provider on this date.  Electronically Signed: Alene Mires, NP 03/09/2021, 1:28 PM   I spent a total of 40 Minutes    in face to face in clinical consultation, greater than 50% of which was counseling/coordinating care for left sided chest tube placement

## 2021-03-09 NOTE — OR Nursing (Signed)
Orders for fluid labs were originally ordered for thoracentesis. Re entered for Dr Fran Lowes. However Dr Skip Mayer did not want flow cytology, so it was deleted.

## 2021-03-09 NOTE — Consult Note (Signed)
Pulmonary Medicine          Date: 03/09/2021,   MRN# [id number] Roy [name]. [birth year]     AdmissionWeight: 103.9 kg                 CurrentWeight: 103.9 kg   Refering physician: Dr Fran Lowes   CHIEF COMPLAINT:   Left large Pleural effusion - chest tube management   HISTORY OF PRESENT ILLNESS   This is a 75yo M with paraplegia due to spinal stenosis, angioedema from Ace I, had pna 6 wks ago, has hx of Dm , CAD, NICM came in with doe sob with shock physiology and hypoxemia.  He was intubated last admission for hypoxemic respiratory failure. His CT chest showed large pleural efusion.  He did reports fevers prior to admission. His urine on admission appreared to be infected with abnormal UA. He was placed on antibiotics and chest tube was ordered for possible empyema/parapneumonic effusion. PCCM consultation for further eval and chest tube management.  Patient is stable post procedure with wife at bedside we reviewed natural history of pleural effusion and possible empyema with additional testing in progress.   PAST MEDICAL HISTORY   Past Medical History:  Diagnosis Date   Bedbound    Bilateral shoulder pain    CHF (congestive heart failure) (HCC)    Coronary artery disease    Diabetes mellitus without complication (HCC)    GERD (gastroesophageal reflux disease)    Glaucoma    HLD (hyperlipidemia)    Hypertension    Lumbar stenosis    Non-ischemic cardiomyopathy (HCC)    PVC (premature ventricular contraction)    Sleep apnea    H/O NO CPAP IN 18 YRS     SURGICAL HISTORY   Past Surgical History:  Procedure Laterality Date   ANTERIOR CERVICAL DECOMP/DISCECTOMY FUSION N/A 01/29/2020   Procedure: ANTERIOR CERVICAL DECOMPRESSION/DISCECTOMY FUSION 1 LEVEL C3/4;  Surgeon: [name], MD;  Location: [facility] ORS;  Service: Neurosurgery;  Laterality: N/A;   arm surgery Right    4x surgery as a child, cut arm falling through glass window    HYDROCELE EXCISION /  REPAIR     POSTERIOR CERVICAL FUSION/FORAMINOTOMY N/A 05/06/2020   Procedure: C3-4 LAMINECTOMY & C3-4 FUSION;  Surgeon: [name], MD;  Location: [facility] ORS;  Service: Neurosurgery;  Laterality: N/A;   REPLACEMENT TOTAL KNEE Right      FAMILY HISTORY   History reviewed. No pertinent family history.   SOCIAL HISTORY   Social History   Tobacco Use   Smoking status: Never   Smokeless tobacco: Never  Vaping Use   Vaping Use: Never used  Substance Use Topics   Alcohol use: No   Drug use: Never     MEDICATIONS    Home Medication:    Current Medication:  Current Facility-Administered Medications:    acetaminophen (TYLENOL) tablet 650 mg, 650 mg, Oral, Q6H PRN **OR** acetaminophen (TYLENOL) suppository 650 mg, 650 mg, Rectal, Q6H PRN, Myrtie Neither, MD   azithromycin (ZITHROMAX) 500 mg in sodium chloride 0.9 % 250 mL IVPB, 500 mg, Intravenous, Q24H, Myrtie Neither, MD, Last Rate: 250 mL/hr at 03/09/21 1304, 500 mg at 03/09/21 1304   baclofen (LIORESAL) tablet 5 mg, 5 mg, Oral, TID, Myrtie Neither, MD, 5 mg at 03/09/21 1651   brimonidine (ALPHAGAN) 0.2 % ophthalmic solution 1 drop, 1 drop, Right Eye, BID, Myrtie Neither, MD, 1 drop at 03/09/21 1020   calcium carbonate (TUMS - dosed in mg elemental  calcium) chewable tablet 200 mg of elemental calcium, 1 tablet, Oral, BID PRN, Myrtie Neither, MD   carvedilol (COREG) tablet 6.25 mg, 6.25 mg, Oral, BID, Myrtie Neither, MD, 6.25 mg at 03/09/21 1019   cefTRIAXone (ROCEPHIN) 1 g in sodium chloride 0.9 % 100 mL IVPB, 1 g, Intravenous, Q24H, Myrtie Neither, MD, Last Rate: 200 mL/hr at 03/09/21 1026, 1 g at 03/09/21 1026   enoxaparin (LOVENOX) injection 52.5 mg, 0.5 mg/kg, Subcutaneous, Q24H, Myrtie Neither, MD, 52.5 mg at 03/08/21 2151   famotidine (PEPCID) tablet 20 mg, 20 mg, Oral, QPM, Myrtie Neither, MD, 20 mg at 03/08/21 1745   gabapentin (NEURONTIN) capsule 300 mg, 300 mg, Oral, QHS, Myrtie Neither, MD, 300 mg at  03/08/21 2151   insulin aspart (novoLOG) injection 0-15 Units, 0-15 Units, Subcutaneous, TID WC, Myrtie Neither, MD, 2 Units at 03/09/21 0835   latanoprost (XALATAN) 0.005 % ophthalmic solution 1 drop, 1 drop, Right Eye, QHS, Myrtie Neither, MD, 1 drop at 03/08/21 2237   midazolam (VERSED) 2 MG/2ML injection, , , ,    polyethylene glycol (MIRALAX / GLYCOLAX) packet 17 g, 17 g, Oral, Daily PRN, Myrtie Neither, MD   simvastatin (ZOCOR) tablet 20 mg, 20 mg, Oral, q1800, Myrtie Neither, MD, 20 mg at 03/08/21 1746   tamsulosin (FLOMAX) capsule 0.4 mg, 0.4 mg, Oral, QPC supper, Myrtie Neither, MD, 0.4 mg at 03/08/21 1745   vitamin B-12 (CYANOCOBALAMIN) tablet 2,000 mcg, 2,000 mcg, Oral, Daily, Myrtie Neither, MD, 2,000 mcg at 03/09/21 1019    ALLERGIES   Ace inhibitors and Lisinopril     REVIEW OF SYSTEMS    Review of Systems:  Gen:  Denies  fever, sweats, chills weigh loss  HEENT: Denies blurred vision, double vision, ear pain, eye pain, hearing loss, nose bleeds, sore throat Cardiac:  No dizziness, chest pain or heaviness, chest tightness,edema Resp:   Denies cough or sputum porduction, shortness of breath,wheezing, hemoptysis,  Gi: Denies swallowing difficulty, stomach pain, nausea or vomiting, diarrhea, constipation, bowel incontinence Gu:  Denies bladder incontinence, burning urine Ext:   Denies Joint pain, stiffness or swelling Skin: Denies  skin rash, easy bruising or bleeding or hives Endoc:  Denies polyuria, polydipsia , polyphagia or weight change Psych:   Denies depression, insomnia or hallucinations   Other:  All other systems negative   VS: BP 114/70 (BP Location: Left Arm)   Pulse 70   Temp 97.9 F (36.6 C) (Oral)   Resp 14   Ht 6' (1.829 m)   Wt 103.9 kg   SpO2 98%   BMI 31.06 kg/m      PHYSICAL EXAM    GENERAL:NAD, no fevers, chills, no weakness no fatigue HEAD: Normocephalic, atraumatic.  EYES: Pupils equal, round, reactive to light.  Extraocular muscles intact. No scleral icterus.  MOUTH: Moist mucosal membrane. Dentition intact. No abscess noted.  EAR, NOSE, THROAT: Clear without exudates. No external lesions.  NECK: Supple. No thyromegaly. No nodules. No JVD.  PULMONARY:Decreased air entry on left with rhonchi bilatearlly.  CARDIOVASCULAR: S1 and S2. Regular rate and rhythm. No murmurs, rubs, or gallops. No edema. Pedal pulses 2+ bilaterally.  GASTROINTESTINAL: Soft, nontender, nondistended. No masses. Positive bowel sounds. No hepatosplenomegaly.  MUSCULOSKELETAL: No swelling, clubbing, or edema. Range of motion full in all extremities.  NEUROLOGIC: Cranial nerves II through XII are intact. No gross focal neurological deficits. Sensation intact. Reflexes intact.  SKIN: No ulceration, lesions, rashes, or cyanosis. Skin warm and dry. Turgor intact.  PSYCHIATRIC: Mood, affect within normal  limits. The patient is awake, alert and oriented x 3. Insight, judgment intact.       IMAGING    DG Chest 2 View  Result Date: 03/07/2021 CLINICAL DATA:  Hypotension, shortness of breath, weakness, angioedema, bundle branch block EXAM: CHEST - 2 VIEW COMPARISON:  01/09/2021 FINDINGS: Enlargement of cardiac silhouette. Large LEFT pleural effusion with subtotal atelectasis of LEFT lung. Underlying infiltrate and other abnormalities including tumor not excluded in this setting. Minimal atelectasis at RIGHT base. Remaining RIGHT lung clear. No pneumothorax or acute osseous findings. Osseous demineralization with degenerative changes of the glenohumeral joints and thoracic spine. IMPRESSION: Large LEFT pleural effusion with subtotal atelectasis of LEFT lung. Enlargement of cardiac silhouette with minimal RIGHT basilar atelectasis. Electronically Signed   By: Ulyses Southward M.D.   On: 03/07/2021 11:16   CT Chest W Contrast  Result Date: 03/07/2021 CLINICAL DATA:  Pleural effusion, hypotension, shortness of breath, weakness EXAM: CT CHEST WITH  CONTRAST TECHNIQUE: Multidetector CT imaging of the chest was performed during intravenous contrast administration. CONTRAST:  55mL OMNIPAQUE IOHEXOL 300 MG/ML  SOLN IV COMPARISON:  Chest radiograph 03/07/2021 FINDINGS: Cardiovascular: Atherosclerotic calcifications aorta, coronary arteries, and proximal great vessels. 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 Mild enlargement of cardiac chambers. Minimal pericardial effusion. Mediastinum/Nodes: Base of cervical region normal appearance. Esophagus unremarkable. 11 mm short axis AP window lymph node image 80. Multiple additional normal sized mediastinal lymph nodes. Fluid collection at the RIGHT paratracheal region 24 x 18 mm likely small nonspecific cyst, less likely fluid within a superior pericardial recess. Lungs/Pleura: Partially loculated large LEFT pleural effusion. Complete atelectasis LEFT lower lobe with subtotal atelectasis of LEFT upper lobe. Fluid-filled lower lobe airways. Small RIGHT pleural effusion and minimal basilar atelectasis. Upper Abdomen: Liver, spleen, adrenal glands, pancreas, and gallbladder unremarkable. Small cysts at upper pole pole of RIGHT kidney. No acute upper abdominal findings. Musculoskeletal: Advanced degenerative changes BILATERAL glenohumeral joints and LEFT sternoclavicular joint. Advanced degenerative changes of the thoracic spine. IMPRESSION: Large partially loculated LEFT pleural effusion with complete atelectasis of LEFT lower lobe and subtotal atelectasis of LEFT upper lobe. Small RIGHT pleural effusion and minimal basilar atelectasis. Nonspecific mildly enlarged AP window lymph node. Extensive atherosclerotic disease changes including coronary arteries. Aortic Atherosclerosis (ICD10-I70.0). Electronically Signed   By: Ulyses Southward M.D.   On: 03/07/2021 12:34   3   ASSESSMENT/PLAN   Left parapneumonic effusion S/p thoracentesis and chest tube placement  -fluid studies need to be collected  and reviewed  -possible transudative due to advanced CHF with previous EF <20% vs paerapneumonic vs emphyema -patient has active fluid draing from chest tube  -he is clinically improved -fluid studies are not available for review yet -chest tube management-  tube is patent, forced expiratory maneuver without air leak, fluid active, tube intact.    Thank you for allowing me to participate in the care of this patient.  Total face to face encounter time for this patient visit was >45 min. >50% of the time was  spent in counseling and coordination of care.   Patient/Family are satisfied with care plan and all questions have been answered.  This document was prepared using Dragon voice recognition software and may include unintentional dictation errors.     Vida Rigger, M.D.  Division of Pulmonary & Critical Care Medicine  [facility]

## 2021-03-10 LAB — BASIC METABOLIC PANEL
Anion gap: 6 (ref 5–15)
BUN: 52 mg/dL — ABNORMAL HIGH (ref 8–23)
CO2: 27 mmol/L (ref 22–32)
Calcium: 8.9 mg/dL (ref 8.9–10.3)
Chloride: 94 mmol/L — ABNORMAL LOW (ref 98–111)
Creatinine, Ser: 0.84 mg/dL (ref 0.61–1.24)
GFR, Estimated: >60 mL/min (ref >60)
Glucose, Bld: 141 mg/dL — ABNORMAL HIGH (ref 70–99)
Potassium: 4.2 mmol/L (ref 3.5–5.1)
Sodium: 127 mmol/L — ABNORMAL LOW (ref 135–145)

## 2021-03-10 LAB — CBC
HCT: 31.3 % — ABNORMAL LOW (ref 39.0–52.0)
Hemoglobin: 10.1 g/dL — ABNORMAL LOW (ref 13.0–17.0)
MCH: 29.7 pg (ref 26.0–34.0)
MCHC: 32.3 g/dL (ref 30.0–36.0)
MCV: 92.1 fL (ref 80.0–100.0)
Platelets: 348 10*3/uL (ref 150–400)
RBC: 3.4 MIL/uL — ABNORMAL LOW (ref 4.22–5.81)
RDW: 12.6 % (ref 11.5–15.5)
WBC: 21.2 10*3/uL — ABNORMAL HIGH (ref 4.0–10.5)
nRBC: 0 % (ref 0.0–0.2)

## 2021-03-10 LAB — MAGNESIUM: Magnesium: 1.8 mg/dL (ref 1.7–2.4)

## 2021-03-10 MED ORDER — CHLORHEXIDINE GLUCONATE CLOTH 2 % EX PADS
6.0000 | MEDICATED_PAD | Freq: Every day | CUTANEOUS | Status: DC
  Administered 2021-03-11 – 2021-03-13 (×3): 6 via TOPICAL

## 2021-03-10 NOTE — Progress Notes (Signed)
Pulmonary Medicine          Date: 03/10/2021,   MRN# [id number] Roy [name]. 04/01/1946     AdmissionWeight: 103.9 kg                 CurrentWeight: 103.9 kg   Refering physician: Dr Fran Lowes   CHIEF COMPLAINT:   Left large Pleural effusion - chest tube management   HISTORY OF PRESENT ILLNESS   This is a 75yo M with paraplegia due to spinal stenosis, angioedema from Ace I, had pna 6 wks ago, has hx of Dm , CAD, NICM came in with doe sob with shock physiology and hypoxemia.  He was intubated last admission for hypoxemic respiratory failure. His CT chest showed large pleural efusion.  He did reports fevers prior to admission. His urine on admission appreared to be infected with abnormal UA. He was placed on antibiotics and chest tube was ordered for possible empyema/parapneumonic effusion. PCCM consultation for further eval and chest tube management.  Patient is stable post procedure with wife at bedside we reviewed natural history of pleural effusion and possible empyema with additional testing in progress.   03/10/21- patient appears to be stable on room air.  Thus far studies are still in process.  From available results there is a concerning finding of glucose <20 in pleural fluid often indicative of empyema.  Patient is DNR but wife is agreable to tpA/Dornase infusion via chest tube or possible decortication.  This would require transfer to [facility] in [place].   PAST MEDICAL HISTORY   Past Medical History:  Diagnosis Date   Bedbound    Bilateral shoulder pain    CHF (congestive heart failure) (HCC)    Coronary artery disease    Diabetes mellitus without complication (HCC)    GERD (gastroesophageal reflux disease)    Glaucoma    HLD (hyperlipidemia)    Hypertension    Lumbar stenosis    Non-ischemic cardiomyopathy (HCC)    PVC (premature ventricular contraction)    Sleep apnea    H/O NO CPAP IN 18 YRS     SURGICAL HISTORY   Past Surgical History:  Procedure  Laterality Date   ANTERIOR CERVICAL DECOMP/DISCECTOMY FUSION N/A 01/29/2020   Procedure: ANTERIOR CERVICAL DECOMPRESSION/DISCECTOMY FUSION 1 LEVEL C3/4;  Surgeon: [name], MD;  Location: [facility] ORS;  Service: Neurosurgery;  Laterality: N/A;   arm surgery Right    4x surgery as a child, cut arm falling through glass window    HYDROCELE EXCISION / REPAIR     POSTERIOR CERVICAL FUSION/FORAMINOTOMY N/A 05/06/2020   Procedure: C3-4 LAMINECTOMY & C3-4 FUSION;  Surgeon: [name], MD;  Location: [facility] ORS;  Service: Neurosurgery;  Laterality: N/A;   REPLACEMENT TOTAL KNEE Right      FAMILY HISTORY   History reviewed. No pertinent family history.   SOCIAL HISTORY   Social History   Tobacco Use   Smoking status: Never   Smokeless tobacco: Never  Vaping Use   Vaping Use: Never used  Substance Use Topics   Alcohol use: No   Drug use: Never     MEDICATIONS    Home Medication:    Current Medication:  Current Facility-Administered Medications:    acetaminophen (TYLENOL) tablet 650 mg, 650 mg, Oral, Q6H PRN, 650 mg at 03/09/21 1832 **OR** acetaminophen (TYLENOL) suppository 650 mg, 650 mg, Rectal, Q6H PRN, Myrtie Neither, MD   azithromycin (ZITHROMAX) 500 mg in sodium chloride 0.9 % 250 mL IVPB, 500 mg,  Intravenous, Q24H, Myrtie Neither, MD, Last Rate: 250 mL/hr at 03/10/21 1031, 500 mg at 03/10/21 1031   baclofen (LIORESAL) tablet 5 mg, 5 mg, Oral, TID, Myrtie Neither, MD, 5 mg at 03/10/21 0934   brimonidine (ALPHAGAN) 0.2 % ophthalmic solution 1 drop, 1 drop, Right Eye, BID, Myrtie Neither, MD, 1 drop at 03/10/21 0936   calcium carbonate (TUMS - dosed in mg elemental calcium) chewable tablet 200 mg of elemental calcium, 1 tablet, Oral, BID PRN, Myrtie Neither, MD   carvedilol (COREG) tablet 6.25 mg, 6.25 mg, Oral, BID, Myrtie Neither, MD, 6.25 mg at 03/10/21 0935   cefTRIAXone (ROCEPHIN) 1 g in sodium chloride 0.9 % 100 mL IVPB, 1 g, Intravenous, Q24H, Myrtie Neither, MD, Last Rate: 200 mL/hr at 03/10/21 0947, 1 g at 03/10/21 0947   enoxaparin (LOVENOX) injection 52.5 mg, 0.5 mg/kg, Subcutaneous, Q24H, Myrtie Neither, MD, 52.5 mg at 03/09/21 2105   famotidine (PEPCID) tablet 20 mg, 20 mg, Oral, QPM, Myrtie Neither, MD, 20 mg at 03/09/21 1827   gabapentin (NEURONTIN) capsule 300 mg, 300 mg, Oral, QHS, Myrtie Neither, MD, 300 mg at 03/09/21 2102   latanoprost (XALATAN) 0.005 % ophthalmic solution 1 drop, 1 drop, Right Eye, QHS, Myrtie Neither, MD, 1 drop at 03/09/21 2104   polyethylene glycol (MIRALAX / GLYCOLAX) packet 17 g, 17 g, Oral, Daily PRN, Myrtie Neither, MD   simvastatin (ZOCOR) tablet 20 mg, 20 mg, Oral, q1800, Myrtie Neither, MD, 20 mg at 03/09/21 1827   tamsulosin (FLOMAX) capsule 0.4 mg, 0.4 mg, Oral, QPC supper, Myrtie Neither, MD, 0.4 mg at 03/09/21 1827   vitamin B-12 (CYANOCOBALAMIN) tablet 2,000 mcg, 2,000 mcg, Oral, Daily, Myrtie Neither, MD, 2,000 mcg at 03/10/21 0935    ALLERGIES   Ace inhibitors and Lisinopril     REVIEW OF SYSTEMS    Review of Systems:  Gen:  Denies  fever, sweats, chills weigh loss  HEENT: Denies blurred vision, double vision, ear pain, eye pain, hearing loss, nose bleeds, sore throat Cardiac:  No dizziness, chest pain or heaviness, chest tightness,edema Resp:   Denies cough or sputum porduction, shortness of breath,wheezing, hemoptysis,  Gi: Denies swallowing difficulty, stomach pain, nausea or vomiting, diarrhea, constipation, bowel incontinence Gu:  Denies bladder incontinence, burning urine Ext:   Denies Joint pain, stiffness or swelling Skin: Denies  skin rash, easy bruising or bleeding or hives Endoc:  Denies polyuria, polydipsia , polyphagia or weight change Psych:   Denies depression, insomnia or hallucinations   Other:  All other systems negative   VS: BP 117/70 (BP Location: Left Arm)   Pulse 66   Temp 98.5 F (36.9 C) (Oral)   Resp 18   Ht 6' (1.829 m)   Wt  103.9 kg   SpO2 95%   BMI 31.06 kg/m      PHYSICAL EXAM    GENERAL:NAD, no fevers, chills, no weakness no fatigue HEAD: Normocephalic, atraumatic.  EYES: Pupils equal, round, reactive to light. Extraocular muscles intact. No scleral icterus.  MOUTH: Moist mucosal membrane. Dentition intact. No abscess noted.  EAR, NOSE, THROAT: Clear without exudates. No external lesions.  NECK: Supple. No thyromegaly. No nodules. No JVD.  PULMONARY:Decreased air entry on left with rhonchi bilatearlly.  CARDIOVASCULAR: S1 and S2. Regular rate and rhythm. No murmurs, rubs, or gallops. No edema. Pedal pulses 2+ bilaterally.  GASTROINTESTINAL: Soft, nontender, nondistended. No masses. Positive bowel sounds. No hepatosplenomegaly.  MUSCULOSKELETAL: No swelling, clubbing, or edema. Range of motion full in all extremities.  NEUROLOGIC: Cranial nerves II through XII are intact. No gross focal neurological deficits. Sensation intact. Reflexes intact.  SKIN: No ulceration, lesions, rashes, or cyanosis. Skin warm and dry. Turgor intact.  PSYCHIATRIC: Mood, affect within normal limits. The patient is awake, alert and oriented x 3. Insight, judgment intact.       IMAGING    DG Chest 2 View  Result Date: 03/07/2021 CLINICAL DATA:  Hypotension, shortness of breath, weakness, angioedema, bundle branch block EXAM: CHEST - 2 VIEW COMPARISON:  01/09/2021 FINDINGS: Enlargement of cardiac silhouette. Large LEFT pleural effusion with subtotal atelectasis of LEFT lung. Underlying infiltrate and other abnormalities including tumor not excluded in this setting. Minimal atelectasis at RIGHT base. Remaining RIGHT lung clear. No pneumothorax or acute osseous findings. Osseous demineralization with degenerative changes of the glenohumeral joints and thoracic spine. IMPRESSION: Large LEFT pleural effusion with subtotal atelectasis of LEFT lung. Enlargement of cardiac silhouette with minimal RIGHT basilar atelectasis.  Electronically Signed   By: Ulyses Southward M.D.   On: 03/07/2021 11:16   CT Chest W Contrast  Result Date: 03/07/2021 CLINICAL DATA:  Pleural effusion, hypotension, shortness of breath, weakness EXAM: CT CHEST WITH CONTRAST TECHNIQUE: Multidetector CT imaging of the chest was performed during intravenous contrast administration. CONTRAST:  79mL OMNIPAQUE IOHEXOL 300 MG/ML  SOLN IV COMPARISON:  Chest radiograph 03/07/2021 FINDINGS: Cardiovascular: Atherosclerotic calcifications aorta, coronary arteries, and proximal great vessels. 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 Mild enlargement of cardiac chambers. Minimal pericardial effusion. Mediastinum/Nodes: Base of cervical region normal appearance. Esophagus unremarkable. 11 mm short axis AP window lymph node image 80. Multiple additional normal sized mediastinal lymph nodes. Fluid collection at the RIGHT paratracheal region 24 x 18 mm likely small nonspecific cyst, less likely fluid within a superior pericardial recess. Lungs/Pleura: Partially loculated large LEFT pleural effusion. Complete atelectasis LEFT lower lobe with subtotal atelectasis of LEFT upper lobe. Fluid-filled lower lobe airways. Small RIGHT pleural effusion and minimal basilar atelectasis. Upper Abdomen: Liver, spleen, adrenal glands, pancreas, and gallbladder unremarkable. Small cysts at upper pole pole of RIGHT kidney. No acute upper abdominal findings. Musculoskeletal: Advanced degenerative changes BILATERAL glenohumeral joints and LEFT sternoclavicular joint. Advanced degenerative changes of the thoracic spine. IMPRESSION: Large partially loculated LEFT pleural effusion with complete atelectasis of LEFT lower lobe and subtotal atelectasis of LEFT upper lobe. Small RIGHT pleural effusion and minimal basilar atelectasis. Nonspecific mildly enlarged AP window lymph node. Extensive atherosclerotic disease changes including coronary arteries. Aortic Atherosclerosis  (ICD10-I70.0). Electronically Signed   By: Ulyses Southward M.D.   On: 03/07/2021 12:34   CT [facility] PLEURAL DRAIN W/INDWELL CATH W/IMG GUIDE  Result Date: 03/10/2021 INDICATION: Large left pleural effusion EXAM: CT-guided placement of left-sided chest tube MEDICATIONS: None ANESTHESIA/SEDATION: Anxiolysis and local analgesia only COMPLICATIONS: None immediate. PROCEDURE: Informed written consent was obtained from the patient after a thorough discussion of the procedural risks, benefits and alternatives. All questions were addressed. Maximal Sterile Barrier Technique was utilized including caps, mask, sterile gowns, sterile gloves, sterile drape, hand hygiene and skin antiseptic. A timeout was performed prior to the initiation of the procedure. The patient was placed supine on the exam table. Limited CT of the chest was performed for planning purposes. This again demonstrated a large left pleural effusion. Skin entry site was marked, and the overlying skin was prepped and draped in a standard sterile fashion. Local analgesia was obtained with 1% lidocaine. Under intermittent CT fluoroscopy, a 10 French locking multipurpose drainage catheter was advanced into the left  pleural fluid using trocar technique. Location was confirmed with CT imaging and return of clear, serous fluid. A sample was collected and sent to the lab for analysis. The catheter was secured to the skin using silk suture and a dressing. It was attached to pleura vac drainage system connected to low continuous suction. The patient tolerated the procedure well without immediate complication. IMPRESSION: 1. Successful CT-guided placement of a 10 French locking multipurpose drainage catheter in the left pleural space. 2. Left pleural drainage catheter attached to Pleur-evac drainage system to low continuous suction. Further recommendations per pulmonology/thoracic surgery. Please call IR with any further questions. Electronically Signed   By: Olive Bass  M.D.   On: 03/10/2021 09:20   3   ASSESSMENT/PLAN   Left empyema S/p thoracentesis and chest tube placement  -fluid studies need to be collected and reviewed  -possible transudative due to advanced CHF with previous EF <20% vs paerapneumonic vs emphyema -patient has active fluid draing from chest tube  -he is clinically improved -fluid studies are not available for review yet -chest tube management-  tube is patent, forced expiratory maneuver without air leak, fluid active, tube intact. -Glucose <10 additional studies in process, recommend further evaluation with thoracic surgery for tPA/dornase infusion BID x3 days.  Reviewed with family.     Thank you for allowing me to participate in the care of this patient.  Total face to face encounter time for this patient visit was >45 min. >50% of the time was  spent in counseling and coordination of care.   Patient/Family are satisfied with care plan and all questions have been answered.  This document was prepared using Dragon voice recognition software and may include unintentional dictation errors.     Vida Rigger, M.D.  Division of Pulmonary & Critical Care Medicine  [facility]

## 2021-03-10 NOTE — Progress Notes (Signed)
PROGRESS NOTE    Roy [name].  OVF:[id number] DOB: 20-Jun-1945 DOA: 03/07/2021 PCP: Rigoberto Noel, MD  [phone]   Assessment & Plan:   Principal Problem:   Recurrent left pleural effusion   Roy Mahan. is a 75 y.o. male with medical history significant for paraplegia secondary to spinal stenosis who is bedbound, angioedema from ACE inhibitor status post intubation recently a month ago recent pneumonia about 6 weeks ago, diabetes mellitus, coronary artery disease, nonischemic cardiomyopathy who presented to the emergency department weeks hypotension with syncope shortness of breath and weakness.  He stated that his blood pressure was 62/42 and oxygen was 78 to 81% yesterday but patient refused to go to the ER when EMS was called,  today he presented to the emergency department due to worsening shortness of breath.  History was mostly obtained from wife who is at bedside.  She said she is a retired Scientist, clinical (histocompatibility and immunogenetics).   Large left pleural effusion with Empyema S/p chest tube placement on 11/28 Subtotal atelectasis of LEFT lung --large amount of output from chest tube, including purulent material --pulm consulted, Dr. Karna Christmas  -Glucose <10 additional studies in process Plan: --cont chest tube per pulm management --per pulm, need further evaluation with thoracic surgery for tPA/dornase infusion BID x3 days. --initiate transfer to [facility]  Likely left PNA --WBC 32.9, procal 0.61.   --cont ceftriaxone and azithro  Acute hypoxemic respiratory failure --tele in the ED showed O2 sats in mid 80's, pt needed 2-3L [place]. --Continue supplemental O2 to keep sats >=90%, wean as tolerated  Urinary tract infection, ruled out --denied dysuria or suprapubic tenderness.  UA obtained from the Foley bag.  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 Patient is  completely bedbound since October 2021 --partial air mattress  Nonischemic cardiomyopathy.   --per wife, EF used to be 17%, but has improved to 60-65% a year ago.  Not on diuretic at home. --cont home coreg  Hyponatremia --Na of 125 on presentation.   --oral hydration.  DM2 --recent A1c 6.1, too low for his age --BG so far have been wnl --d/c'ed fingersticks and insulin   DVT prophylaxis: Lovenox SQ Code Status: DNR  Family Communication: wife updated at bedside today Level of care: Med-Surg Dispo:   The patient is from: home Anticipated d/c is to: [facility] Anticipated d/c date is: whenever bed available   Subjective and Interval History:  Pt reported feeling better.  Wife reported watery BM x2 today.  Significant output per chest tube.  Due to concern for empyema, pulm rec transfer to [facility].   Objective: Vitals:   03/10/21 0606 03/10/21 0750 03/10/21 1428 03/10/21 1604  BP: 102/60 (!) 103/50  117/70  Pulse: 71 65  66  Resp: 20 18  18   Temp: (!) 97.5 F (36.4 C) 97.8 F (36.6 C)  98.5 F (36.9 C)  TempSrc: Oral Oral  Oral  SpO2: 98% 98% 95% 95%  Weight:      Height:        Intake/Output Summary (Last 24 hours) at 03/10/2021 1954 Last data filed at 03/10/2021 1852 Gross per 24 hour  Intake 2425 ml  Output 2675 ml  Net -250 ml   Filed Weights   03/07/21 1036  Weight: 103.9 kg    Examination:   Constitutional: NAD, AAOx3 CV: No cyanosis.   RESP: normal respiratory effort, on 1L, chest tube  present with active output Extremities: pitting edema in both feet SKIN: warm, dry Neuro: II - XII grossly intact.   Psych: Normal mood and affect.  Appropriate judgement and reason    Data Reviewed: I have personally reviewed following labs and imaging studies  CBC: Recent Labs  Lab 03/07/21 1044 03/08/21 0559 03/09/21 0441 03/10/21 0208  WBC 32.9* 33.1* 28.2* 21.2*  HGB 10.8* 10.2* 10.3* 10.1*  HCT 32.6* 30.2* 31.0* 31.3*  MCV 93.4 91.5 91.2 92.1  PLT  358 396 362 348   Basic Metabolic Panel: Recent Labs  Lab 03/07/21 1044 03/08/21 0559 03/09/21 0441 03/10/21 0208  NA 125* 127* 129* 127*  K 4.5 4.7 4.6 4.2  CL 89* 92* 94* 94*  CO2 28 26 27 27   GLUCOSE 74 73 121* 141*  BUN 34* 39* 47* 52*  CREATININE 0.96 1.00 1.01 0.84  CALCIUM 8.8* 8.9 9.1 8.9  MG  --   --  1.7 1.8   GFR: Estimated Creatinine Clearance: 94.7 mL/min (by C-G formula based on SCr of 0.84 mg/dL). Liver Function Tests: No results for input(s): AST, ALT, ALKPHOS, BILITOT, PROT, ALBUMIN in the last 168 hours. No results for input(s): LIPASE, AMYLASE in the last 168 hours. No results for input(s): AMMONIA in the last 168 hours. Coagulation Profile: No results for input(s): INR, PROTIME in the last 168 hours. Cardiac Enzymes: No results for input(s): CKTOTAL, CKMB, CKMBINDEX, TROPONINI in the last 168 hours. BNP (last 3 results) No results for input(s): PROBNP in the last 8760 hours. HbA1C: No results for input(s): HGBA1C in the last 72 hours. CBG: Recent Labs  Lab 03/08/21 1649 03/08/21 2239 03/09/21 0803 03/09/21 1110 03/09/21 1637  GLUCAP 102* 112* 132* 101* 97   Lipid Profile: No results for input(s): CHOL, HDL, LDLCALC, TRIG, CHOLHDL, LDLDIRECT in the last 72 hours. Thyroid Function Tests: No results for input(s): TSH, T4TOTAL, FREET4, T3FREE, THYROIDAB in the last 72 hours. Anemia Panel: No results for input(s): VITAMINB12, FOLATE, FERRITIN, TIBC, IRON, RETICCTPCT in the last 72 hours. Sepsis Labs: Recent Labs  Lab 03/08/21 0559  PROCALCITON 0.61    Recent Results (from the past 240 hour(s))  Resp Panel by RT-PCR (Flu A&B, Covid) Nasopharyngeal Swab     Status: None   Collection Time: 03/07/21 12:31 PM   Specimen: Nasopharyngeal Swab; Nasopharyngeal(NP) swabs in vial transport medium  Result Value Ref Range Status   SARS Coronavirus 2 by RT PCR NEGATIVE NEGATIVE Final    Comment: (NOTE) SARS-CoV-2 target nucleic acids are NOT  DETECTED.  The SARS-CoV-2 RNA is generally detectable in upper respiratory specimens during the acute phase of infection. The lowest concentration of SARS-CoV-2 viral copies this assay can detect is 138 copies/mL. A negative result does not preclude SARS-Cov-2 infection and should not be used as the sole basis for treatment or other patient management decisions. A negative result may occur with  improper specimen collection/handling, submission of specimen other than nasopharyngeal swab, presence of viral mutation(s) within the areas targeted by this assay, and inadequate number of viral copies(<138 copies/mL). A negative result must be combined with clinical observations, patient history, and epidemiological information. The expected result is Negative.  Fact Sheet for Patients:  03/09/21  Fact Sheet for Healthcare Providers:  BloggerCourse.com  This test is no t yet approved or cleared by the [place] FDA and  has been authorized for detection and/or diagnosis of SARS-CoV-2 by FDA under an Emergency Use Authorization (EUA). This EUA will remain  in effect (meaning  this test can be used) for the duration of the COVID-19 declaration under Section 564(b)(1) of the Act, 21 U.S.C.section 360bbb-3(b)(1), unless the authorization is terminated  or revoked sooner.       Influenza A by PCR NEGATIVE NEGATIVE Final   Influenza B by PCR NEGATIVE NEGATIVE Final    Comment: (NOTE) The Xpert Xpress SARS-CoV-2/FLU/RSV plus assay is intended as an aid in the diagnosis of influenza from Nasopharyngeal swab specimens and should not be used as a sole basis for treatment. Nasal washings and aspirates are unacceptable for Xpert Xpress SARS-CoV-2/FLU/RSV testing.  Fact Sheet for Patients: BloggerCourse.com  Fact Sheet for Healthcare Providers: SeriousBroker.it  This test is not yet  approved or cleared by the [place] FDA and has been authorized for detection and/or diagnosis of SARS-CoV-2 by FDA under an Emergency Use Authorization (EUA). This EUA will remain in effect (meaning this test can be used) for the duration of the COVID-19 declaration under Section 564(b)(1) of the Act, 21 U.S.C. section 360bbb-3(b)(1), unless the authorization is terminated or revoked.  Performed at [facility], [address] [place], [place] [address]   Body fluid culture w Gram Stain     Status: None (Preliminary result)   Collection Time: 03/09/21  5:43 PM   Specimen: Pleura; Body Fluid  Result Value Ref Range Status   Specimen Description   Final    PLEURAL Performed at [facility], [address], [place], [place] [address]    Special Requests   Final    LEFT LUNG Performed at [facility], [address] [place], [place] [address]    Gram Stain   Final    NO WBC SEEN NO ORGANISMS SEEN Performed at [facility], [address], [place], [place] [address]    Culture PENDING  Incomplete   Report Status PENDING  Incomplete      Radiology Studies: CT [facility] PLEURAL DRAIN W/INDWELL CATH W/IMG GUIDE  Result Date: 03/10/2021 INDICATION: Large left pleural effusion EXAM: CT-guided placement of left-sided chest tube MEDICATIONS: None ANESTHESIA/SEDATION: Anxiolysis and local analgesia only COMPLICATIONS: None immediate. PROCEDURE: Informed written consent was obtained from the patient after a thorough discussion of the procedural risks, benefits and alternatives. All questions were addressed. Maximal Sterile Barrier Technique was utilized including caps, mask, sterile gowns, sterile gloves, sterile drape, hand hygiene and skin antiseptic. A timeout was performed prior to the initiation of the procedure. The patient was placed supine on the exam table. Limited CT of the chest was performed for planning purposes. This again demonstrated a  large left pleural effusion. Skin entry site was marked, and the overlying skin was prepped and draped in a standard sterile fashion. Local analgesia was obtained with 1% lidocaine. Under intermittent CT fluoroscopy, a 10 French locking multipurpose drainage catheter was advanced into the left pleural fluid using trocar technique. Location was confirmed with CT imaging and return of clear, serous fluid. A sample was collected and sent to the lab for analysis. The catheter was secured to the skin using silk suture and a dressing. It was attached to pleura vac drainage system connected to low continuous suction. The patient tolerated the procedure well without immediate complication. IMPRESSION: 1. Successful CT-guided placement of a 10 French locking multipurpose drainage catheter in the left pleural space. 2. Left pleural drainage catheter attached to Pleur-evac drainage system to low continuous suction. Further recommendations per pulmonology/thoracic surgery. Please call IR with any further questions. Electronically Signed   By:  Olive Bass M.D.   On: 03/10/2021 09:20     Scheduled Meds:  baclofen  5 mg Oral TID   brimonidine  1 drop Right Eye BID   carvedilol  6.25 mg Oral BID   enoxaparin (LOVENOX) injection  0.5 mg/kg Subcutaneous Q24H   famotidine  20 mg Oral QPM   gabapentin  300 mg Oral QHS   latanoprost  1 drop Right Eye QHS   simvastatin  20 mg Oral q1800   tamsulosin  0.4 mg Oral QPC supper   cyanocobalamin  2,000 mcg Oral Daily   Continuous Infusions:  azithromycin 500 mg (03/10/21 1031)   cefTRIAXone (ROCEPHIN)  IV 1 g (03/10/21 0947)     LOS: 3 days     Darlin Priestly, MD [facility] If 7PM-7AM, please contact night-coverage 03/10/2021, 7:54 PM

## 2021-03-11 LAB — ACID FAST SMEAR (AFB, MYCOBACTERIA): Acid Fast Smear: NEGATIVE

## 2021-03-11 LAB — CYTOLOGY - NON PAP

## 2021-03-11 LAB — CBC
HCT: 31.4 % — ABNORMAL LOW (ref 39.0–52.0)
Hemoglobin: 10.6 g/dL — ABNORMAL LOW (ref 13.0–17.0)
MCH: 31.1 pg (ref 26.0–34.0)
MCHC: 33.8 g/dL (ref 30.0–36.0)
MCV: 92.1 fL (ref 80.0–100.0)
Platelets: 399 10*3/uL (ref 150–400)
RBC: 3.41 MIL/uL — ABNORMAL LOW (ref 4.22–5.81)
RDW: 12.6 % (ref 11.5–15.5)
WBC: 13.8 10*3/uL — ABNORMAL HIGH (ref 4.0–10.5)
nRBC: 0 % (ref 0.0–0.2)

## 2021-03-11 LAB — BASIC METABOLIC PANEL
Anion gap: 5 (ref 5–15)
BUN: 43 mg/dL — ABNORMAL HIGH (ref 8–23)
CO2: 29 mmol/L (ref 22–32)
Calcium: 9.1 mg/dL (ref 8.9–10.3)
Chloride: 96 mmol/L — ABNORMAL LOW (ref 98–111)
Creatinine, Ser: 0.67 mg/dL (ref 0.61–1.24)
GFR, Estimated: >60 mL/min (ref >60)
Glucose, Bld: 166 mg/dL — ABNORMAL HIGH (ref 70–99)
Potassium: 4 mmol/L (ref 3.5–5.1)
Sodium: 130 mmol/L — ABNORMAL LOW (ref 135–145)

## 2021-03-11 LAB — MAGNESIUM: Magnesium: 1.7 mg/dL (ref 1.7–2.4)

## 2021-03-11 LAB — HEMOGLOBIN A1C
Hgb A1c MFr Bld: 6 % — ABNORMAL HIGH (ref 4.8–5.6)
Mean Plasma Glucose: 126 mg/dL

## 2021-03-11 LAB — TRIGLYCERIDES, BODY FLUIDS

## 2021-03-11 MED ORDER — MELOXICAM 7.5 MG PO TABS: 7.5000 mg | ORAL_TABLET | Freq: Every day | ORAL | Status: AC | PRN

## 2021-03-11 MED ORDER — ACETAMINOPHEN 500 MG PO TABS: 1000.0000 mg | ORAL_TABLET | Freq: Four times a day (QID) | ORAL | 0 refills | Status: AC | PRN

## 2021-03-11 MED ORDER — INSULIN ASPART 100 UNIT/ML ~~LOC~~ SOLN: SUBCUTANEOUS | 11 refills | Status: DC

## 2021-03-11 MED ORDER — LEVEMIR FLEXTOUCH 100 UNIT/ML ~~LOC~~ SOPN: PEN_INJECTOR | SUBCUTANEOUS | 11 refills | Status: DC

## 2021-03-11 MED ORDER — POLYETHYLENE GLYCOL 3350 17 GM/SCOOP PO POWD: 17.0000 g | Freq: Every day | ORAL | 0 refills | Status: AC | PRN

## 2021-03-11 MED ORDER — AMLODIPINE BESYLATE 10 MG PO TABS: ORAL_TABLET | ORAL | Status: DC

## 2021-03-11 MED ORDER — AZITHROMYCIN 250 MG PO TABS
500.0000 mg | ORAL_TABLET | Freq: Every day | ORAL | Status: DC
  Administered 2021-03-11 – 2021-03-12 (×2): 500 mg via ORAL
  Filled 2021-03-11 (×2): qty 2

## 2021-03-11 NOTE — Plan of Care (Signed)

## 2021-03-11 NOTE — Discharge Summary (Signed)
Physician Discharge Summary   Roy [name]  male DOB: 05-28-1945  EMV:[id number]  PCP: Rigoberto Noel, MD  Admit date: 03/07/2021 Discharge date: 03/11/2021  Admitted From: home Disposition:  [facility] CODE STATUS: DNR   Hospital Course:  For full details, please see H&P, progress notes, consult notes and ancillary notes.  Briefly,  Roy [name]. is a 75 y.o. male with medical history significant for paraplegia secondary to spinal stenosis who is bedbound, angioedema from ACE inhibitor status post intubation recently a month ago, recent pneumonia about 6 weeks ago, diabetes mellitus, coronary artery disease, nonischemic cardiomyopathy who presented to the emergency department for hypotension with syncope, shortness of breath and weakness.    History was mostly obtained from wife who is at bedside.  She said she is a retired Scientist, clinical (histocompatibility and immunogenetics).   Large left pleural effusion with Empyema S/p chest tube placement on 11/28 CT chest showed subtotal atelectasis of LEFT lung and large partially loculated pleural effusion.  After chest tube inserted, pt had large amount of output from chest tube, including purulent material. --pulm consulted, Dr. Karna Christmas saw pt. --Fluid studies notable for Glucose <20, concerning for empyema.  Dr. Karna Christmas rec transferring to [facility] for further evaluation with thoracic surgery for tPA/dornase infusion BID x3 days.  Likely left PNA --WBC 32.9, procal 0.61.   --completed 5 days of ceftriaxone and azithro on 11/30.  Continue both abx, per pulm rec.   Acute hypoxemic respiratory failure --tele in the ED showed O2 sats in mid 80's, pt needed 2-3L [place].  Now down to 1L O2.  Urinary tract infection, ruled out --denied dysuria or suprapubic tenderness.  UA obtained from the Foley bag, so considered contaminated.     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 --cont home Foley --cont  Flomax  Spinal stenosis with paraplegia. Patient is completely bedbound since October 2021 --cont home Baclofen --partial air mattress  Nonischemic cardiomyopathy.   --per wife, EF used to be 17%, but has improved to 60-65% a year ago.  Not on diuretic at home. --cont home coreg  Hyponatremia --Na of 125 on presentation.  Improved to 130 prior to discharge with just oral hydration.   DM2 --recent A1c 6.1, too low for his age --BG during hospitalization have been wnl without any insulin.  Fingersticks d/c'ed.  Home insulin held at discharge. --Home metformin resumed at discharge.    Discharge Diagnoses:  Principal Problem:   Recurrent left pleural effusion   30 Day Unplanned Readmission Risk Score    Flowsheet Row ED to Hosp-Admission (Current) from 03/07/2021 in [facility]  30 Day Unplanned Readmission Risk Score (%) 14.42 Filed at 03/11/2021 1600       This score is the patient's risk of an unplanned readmission within 30 days of being discharged (0 -100%). The score is based on dignosis, age, lab data, medications, orders, and past utilization.   Low:  0-14.9   Medium: 15-21.9   High: 22-29.9   Extreme: 30 and above         Discharge Instructions:  Allergies as of 03/11/2021       Reactions   Ace Inhibitors Other (See Comments)   Angioedema   Lisinopril Swelling   Angioedema        Medication List     TAKE these medications    acetaminophen 500 MG tablet Commonly known as: TYLENOL Take 2 tablets (1,000 mg total)  by mouth every 6 (six) hours as needed. What changed:  when to take this reasons to take this   amLODipine 10 MG tablet Commonly known as: NORVASC Hold due to soft blood pressure. What changed:  how much to take how to take this when to take this additional instructions   aspirin 81 MG chewable tablet Chew 81 mg by mouth daily.   baclofen 10 MG tablet Commonly known as: LIORESAL Take 5 mg by  mouth 3 (three) times daily.   brimonidine 0.2 % ophthalmic solution Commonly known as: ALPHAGAN Place 1 drop into the right eye in the morning and at bedtime.   calcium carbonate 500 MG chewable tablet Commonly known as: TUMS - dosed in mg elemental calcium Chew 1 tablet by mouth 2 (two) times daily as needed for indigestion or heartburn.   carvedilol 6.25 MG tablet Commonly known as: COREG Take 6.25 mg by mouth 2 (two) times daily.   CENTRUM SILVER 50+MEN PO Take 1 tablet by mouth daily.   cyanocobalamin 2000 MCG tablet Take 2,000 mcg by mouth daily.   famotidine 20 MG tablet Commonly known as: PEPCID Take 20 mg by mouth every evening.   gabapentin 300 MG capsule Commonly known as: NEURONTIN Take 300 mg by mouth at bedtime.   insulin aspart 100 UNIT/ML injection Commonly known as: novoLOG Hold since blood glucose within goal without any insulin. What changed:  how much to take how to take this when to take this additional instructions   latanoprost 0.005 % ophthalmic solution Commonly known as: XALATAN Place 1 drop into the right eye at bedtime.   Levemir FlexTouch 100 UNIT/ML FlexPen Generic drug: insulin detemir Hold since blood glucose within goal without any insulin. What changed:  how much to take how to take this when to take this additional instructions   meloxicam 7.5 MG tablet Commonly known as: MOBIC Take 1 tablet (7.5 mg total) by mouth daily as needed for pain. What changed:  when to take this reasons to take this   metFORMIN 500 MG 24 hr tablet Commonly known as: GLUCOPHAGE-XR Take 500 mg by mouth 2 (two) times daily.   polyethylene glycol powder 17 GM/SCOOP powder Commonly known as: GLYCOLAX/MIRALAX Take 17 g by mouth daily as needed for moderate constipation. What changed: how much to take   simvastatin 20 MG tablet Commonly known as: ZOCOR Take 20 mg by mouth daily at 6 PM.   tamsulosin 0.4 MG Caps capsule Commonly known as:  FLOMAX Take 1 capsule (0.4 mg total) by mouth daily after supper.          Allergies  Allergen Reactions   Ace Inhibitors Other (See Comments)    Angioedema   Lisinopril Swelling    Angioedema     The results of significant diagnostics from this hospitalization (including imaging, microbiology, ancillary and laboratory) are listed below for reference.   Consultations:   Procedures/Studies: DG Chest 2 View  Result Date: 03/07/2021 CLINICAL DATA:  Hypotension, shortness of breath, weakness, angioedema, bundle branch block EXAM: CHEST - 2 VIEW COMPARISON:  01/09/2021 FINDINGS: Enlargement of cardiac silhouette. Large LEFT pleural effusion with subtotal atelectasis of LEFT lung. Underlying infiltrate and other abnormalities including tumor not excluded in this setting. Minimal atelectasis at RIGHT base. Remaining RIGHT lung clear. No pneumothorax or acute osseous findings. Osseous demineralization with degenerative changes of the glenohumeral joints and thoracic spine. IMPRESSION: Large LEFT pleural effusion with subtotal atelectasis of LEFT lung. Enlargement of cardiac silhouette with minimal RIGHT  basilar atelectasis. Electronically Signed   By: Ulyses Southward M.D.   On: 03/07/2021 11:16   CT Chest W Contrast  Result Date: 03/07/2021 CLINICAL DATA:  Pleural effusion, hypotension, shortness of breath, weakness EXAM: CT CHEST WITH CONTRAST TECHNIQUE: Multidetector CT imaging of the chest was performed during intravenous contrast administration. CONTRAST:  31mL OMNIPAQUE IOHEXOL 300 MG/ML  SOLN IV COMPARISON:  Chest radiograph 03/07/2021 FINDINGS: Cardiovascular: Atherosclerotic calcifications aorta, coronary arteries, and proximal great vessels. 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 Mild enlargement of cardiac chambers. Minimal pericardial effusion. Mediastinum/Nodes: Base of cervical region normal appearance. Esophagus unremarkable. 11 mm short axis AP window  lymph node image 80. Multiple additional normal sized mediastinal lymph nodes. Fluid collection at the RIGHT paratracheal region 24 x 18 mm likely small nonspecific cyst, less likely fluid within a superior pericardial recess. Lungs/Pleura: Partially loculated large LEFT pleural effusion. Complete atelectasis LEFT lower lobe with subtotal atelectasis of LEFT upper lobe. Fluid-filled lower lobe airways. Small RIGHT pleural effusion and minimal basilar atelectasis. Upper Abdomen: Liver, spleen, adrenal glands, pancreas, and gallbladder unremarkable. Small cysts at upper pole pole of RIGHT kidney. No acute upper abdominal findings. Musculoskeletal: Advanced degenerative changes BILATERAL glenohumeral joints and LEFT sternoclavicular joint. Advanced degenerative changes of the thoracic spine. IMPRESSION: Large partially loculated LEFT pleural effusion with complete atelectasis of LEFT lower lobe and subtotal atelectasis of LEFT upper lobe. Small RIGHT pleural effusion and minimal basilar atelectasis. Nonspecific mildly enlarged AP window lymph node. Extensive atherosclerotic disease changes including coronary arteries. Aortic Atherosclerosis (ICD10-I70.0). Electronically Signed   By: Ulyses Southward M.D.   On: 03/07/2021 12:34   CT [facility] PLEURAL DRAIN W/INDWELL CATH W/IMG GUIDE  Result Date: 03/10/2021 INDICATION: Large left pleural effusion EXAM: CT-guided placement of left-sided chest tube MEDICATIONS: None ANESTHESIA/SEDATION: Anxiolysis and local analgesia only COMPLICATIONS: None immediate. PROCEDURE: Informed written consent was obtained from the patient after a thorough discussion of the procedural risks, benefits and alternatives. All questions were addressed. Maximal Sterile Barrier Technique was utilized including caps, mask, sterile gowns, sterile gloves, sterile drape, hand hygiene and skin antiseptic. A timeout was performed prior to the initiation of the procedure. The patient was placed supine on the exam  table. Limited CT of the chest was performed for planning purposes. This again demonstrated a large left pleural effusion. Skin entry site was marked, and the overlying skin was prepped and draped in a standard sterile fashion. Local analgesia was obtained with 1% lidocaine. Under intermittent CT fluoroscopy, a 10 French locking multipurpose drainage catheter was advanced into the left pleural fluid using trocar technique. Location was confirmed with CT imaging and return of clear, serous fluid. A sample was collected and sent to the lab for analysis. The catheter was secured to the skin using silk suture and a dressing. It was attached to pleura vac drainage system connected to low continuous suction. The patient tolerated the procedure well without immediate complication. IMPRESSION: 1. Successful CT-guided placement of a 10 French locking multipurpose drainage catheter in the left pleural space. 2. Left pleural drainage catheter attached to Pleur-evac drainage system to low continuous suction. Further recommendations per pulmonology/thoracic surgery. Please call IR with any further questions. Electronically Signed   By: Olive Bass M.D.   On: 03/10/2021 09:20      Labs: BNP (last 3 results) Recent Labs    03/07/21 1044  BNP 485.0*   Basic Metabolic Panel: Recent Labs  Lab 03/07/21 1044 03/08/21 0559 03/09/21 0441 03/10/21 6314 03/11/21 9702  NA 125* 127* 129* 127* 130*  K 4.5 4.7 4.6 4.2 4.0  CL 89* 92* 94* 94* 96*  CO2 GLUCOSE 74 73 121* 141* 166*  BUN 34* 39* 47* 52* 43*  CREATININE 0.96 1.00 1.01 0.84 0.67  CALCIUM 8.8* 8.9 9.1 8.9 9.1  MG  --   --  1.7 1.8 1.7   Liver Function Tests: No results for input(s): AST, ALT, ALKPHOS, BILITOT, PROT, ALBUMIN in the last 168 hours. No results for input(s): LIPASE, AMYLASE in the last 168 hours. No results for input(s): AMMONIA in the last 168 hours. CBC: Recent Labs  Lab 03/07/21 1044 03/08/21 0559 03/09/21 0441  03/10/21 0208 03/11/21 0629  WBC 32.9* 33.1* 28.2* 21.2* 13.8*  HGB 10.8* 10.2* 10.3* 10.1* 10.6*  HCT 32.6* 30.2* 31.0* 31.3* 31.4*  MCV 93.4 91.5 91.2 92.1 92.1  PLT 358 396 362 348 399   Cardiac Enzymes: No results for input(s): CKTOTAL, CKMB, CKMBINDEX, TROPONINI in the last 168 hours. BNP: Invalid input(s): POCBNP CBG: Recent Labs  Lab 03/08/21 1649 03/08/21 2239 03/09/21 0803 03/09/21 1110 03/09/21 1637  GLUCAP 102* 112* 132* 101* 97   D-Dimer No results for input(s): DDIMER in the last 72 hours. Hgb A1c No results for input(s): HGBA1C in the last 72 hours. Lipid Profile No results for input(s): CHOL, HDL, LDLCALC, TRIG, CHOLHDL, LDLDIRECT in the last 72 hours. Thyroid function studies No results for input(s): TSH, T4TOTAL, T3FREE, THYROIDAB in the last 72 hours.  Invalid input(s): FREET3 Anemia work up No results for input(s): VITAMINB12, FOLATE, FERRITIN, TIBC, IRON, RETICCTPCT in the last 72 hours. Urinalysis    Component Value Date/Time   COLORURINE AMBER (A) 03/07/2021 1231   APPEARANCEUR CLEAR 03/07/2021 1231   LABSPEC >1.030 (H) 03/07/2021 1231   PHURINE 5.5 03/07/2021 1231   GLUCOSEU NEGATIVE 03/07/2021 1231   HGBUR NEGATIVE 03/07/2021 1231   BILIRUBINUR SMALL (A) 03/07/2021 1231   KETONESUR NEGATIVE 03/07/2021 1231   PROTEINUR 100 (A) 03/07/2021 1231   NITRITE POSITIVE (A) 03/07/2021 1231   LEUKOCYTESUR SMALL (A) 03/07/2021 1231   Sepsis Labs Invalid input(s): PROCALCITONIN,  WBC,  LACTICIDVEN Microbiology Recent Results (from the past 240 hour(s))  Resp Panel by RT-PCR (Flu A&B, Covid) Nasopharyngeal Swab     Status: None   Collection Time: 03/07/21 12:31 PM   Specimen: Nasopharyngeal Swab; Nasopharyngeal(NP) swabs in vial transport medium  Result Value Ref Range Status   SARS Coronavirus 2 by RT PCR NEGATIVE NEGATIVE Final    Comment: (NOTE) SARS-CoV-2 target nucleic acids are NOT DETECTED.  The SARS-CoV-2 RNA is generally detectable in  upper respiratory specimens during the acute phase of infection. The lowest concentration of SARS-CoV-2 viral copies this assay can detect is 138 copies/mL. A negative result does not preclude SARS-Cov-2 infection and should not be used as the sole basis for treatment or other patient management decisions. A negative result may occur with  improper specimen collection/handling, submission of specimen other than nasopharyngeal swab, presence of viral mutation(s) within the areas targeted by this assay, and inadequate number of viral copies(<138 copies/mL). A negative result must be combined with clinical observations, patient history, and epidemiological information. The expected result is Negative.  Fact Sheet for Patients:  BloggerCourse.com  Fact Sheet for Healthcare Providers:  SeriousBroker.it  This test is no t yet approved or cleared by the [place] FDA and  has been authorized for detection and/or diagnosis of SARS-CoV-2 by FDA under an Emergency Use Authorization (  EUA). This EUA will remain  in effect (meaning this test can be used) for the duration of the COVID-19 declaration under Section 564(b)(1) of the Act, 21 U.S.C.section 360bbb-3(b)(1), unless the authorization is terminated  or revoked sooner.       Influenza A by PCR NEGATIVE NEGATIVE Final   Influenza B by PCR NEGATIVE NEGATIVE Final    Comment: (NOTE) The Xpert Xpress SARS-CoV-2/FLU/RSV plus assay is intended as an aid in the diagnosis of influenza from Nasopharyngeal swab specimens and should not be used as a sole basis for treatment. Nasal washings and aspirates are unacceptable for Xpert Xpress SARS-CoV-2/FLU/RSV testing.  Fact Sheet for Patients: BloggerCourse.com  Fact Sheet for Healthcare Providers: SeriousBroker.it  This test is not yet approved or cleared by the [place] FDA and has been  authorized for detection and/or diagnosis of SARS-CoV-2 by FDA under an Emergency Use Authorization (EUA). This EUA will remain in effect (meaning this test can be used) for the duration of the COVID-19 declaration under Section 564(b)(1) of the Act, 21 U.S.C. section 360bbb-3(b)(1), unless the authorization is terminated or revoked.  Performed at [facility], [address] [place], [place] [address]   Body fluid culture w Gram Stain     Status: None (Preliminary result)   Collection Time: 03/09/21  5:43 PM   Specimen: Pleura; Body Fluid  Result Value Ref Range Status   Specimen Description   Final    PLEURAL Performed at [facility], [address] [place], [place] [address]    Special Requests   Final    LEFT LUNG Performed at [facility], [address] [place], [place] [address]    Gram Stain NO WBC SEEN NO ORGANISMS SEEN   Final   Culture   Final    NO GROWTH 1 DAY Performed at [facility], [address], [place], [place] [address]    Report Status PENDING  Incomplete  Acid Fast Smear (AFB)     Status: None   Collection Time: 03/09/21  5:43 PM   Specimen: Chest; Lung  Result Value Ref Range Status   AFB Specimen Processing Concentration  Final   Acid Fast Smear Negative  Final    Comment: (NOTE) Performed At: [facility] [address] [place], [place] [id number] Jolene Schimke MD JF:[id number]    Source (AFB) PLEURAL  Final    Comment: Performed at [facility], [address] [place], [place] [address]     Total time spend on discharging this patient, including the last patient exam, discussing the hospital stay, instructions for ongoing care as it relates to all pertinent caregivers, as well as preparing the medical discharge records, prescriptions, and/or referrals as applicable, is 50 minutes.    Darlin Priestly, MD  [facility] 03/11/2021, 5:29 PM

## 2021-03-11 NOTE — Progress Notes (Signed)
PHARMACIST - PHYSICIAN COMMUNICATION  CONCERNING: Antibiotic IV to Oral Route Change Policy  RECOMMENDATION: This patient is receiving azithromycin by the intravenous route.  Based on criteria approved by the Pharmacy and Therapeutics Committee, the antibiotic(s) is/are being converted to the equivalent oral dose form(s).   DESCRIPTION: These criteria include: Patient being treated for a respiratory tract infection, urinary tract infection, cellulitis or clostridium difficile associated diarrhea if on metronidazole The patient is not neutropenic and does not exhibit a GI malabsorption state The patient is eating (either orally or via tube) and/or has been taking other orally administered medications for a least 24 hours The patient is improving clinically and has a Tmax < 100.5  If you have questions about this conversion, please contact the Pharmacy Department   Roy [name]  03/11/21

## 2021-03-11 NOTE — Care Management Important Message (Signed)
Important Message  Patient Details  Name: Roy [name]. MRN: [id number] Date of Birth: Jun 12, 1945   Medicare Important Message Given:  Yes     [name] 03/11/2021, 10:56 AM

## 2021-03-12 ENCOUNTER — Inpatient Hospital Stay: Payer: Medicare HMO

## 2021-03-12 LAB — TRIGLYCERIDES, BODY FLUIDS: Triglycerides, Fluid: 34 mg/dL

## 2021-03-12 LAB — BASIC METABOLIC PANEL
Anion gap: 5 (ref 5–15)
BUN: 38 mg/dL — ABNORMAL HIGH (ref 8–23)
CO2: 30 mmol/L (ref 22–32)
Calcium: 9 mg/dL (ref 8.9–10.3)
Chloride: 98 mmol/L (ref 98–111)
Creatinine, Ser: 0.57 mg/dL — ABNORMAL LOW (ref 0.61–1.24)
GFR, Estimated: >60 mL/min (ref >60)
Glucose, Bld: 194 mg/dL — ABNORMAL HIGH (ref 70–99)
Potassium: 3.9 mmol/L (ref 3.5–5.1)
Sodium: 133 mmol/L — ABNORMAL LOW (ref 135–145)

## 2021-03-12 LAB — CBC
HCT: 30.9 % — ABNORMAL LOW (ref 39.0–52.0)
Hemoglobin: 10.4 g/dL — ABNORMAL LOW (ref 13.0–17.0)
MCH: 31 pg (ref 26.0–34.0)
MCHC: 33.7 g/dL (ref 30.0–36.0)
MCV: 92.2 fL (ref 80.0–100.0)
Platelets: 429 10*3/uL — ABNORMAL HIGH (ref 150–400)
RBC: 3.35 MIL/uL — ABNORMAL LOW (ref 4.22–5.81)
RDW: 12.8 % (ref 11.5–15.5)
WBC: 14.2 10*3/uL — ABNORMAL HIGH (ref 4.0–10.5)
nRBC: 0 % (ref 0.0–0.2)

## 2021-03-12 LAB — MAGNESIUM: Magnesium: 1.8 mg/dL (ref 1.7–2.4)

## 2021-03-12 IMAGING — CR DG CHEST 2V
3 series · 3 of 3 positions shown · non-contrast
Comparison: Chest radiograph [DATE]

CLINICAL DATA: Empyema

EXAM:
CHEST - 2 VIEW

[chest lat (1 of 2)]
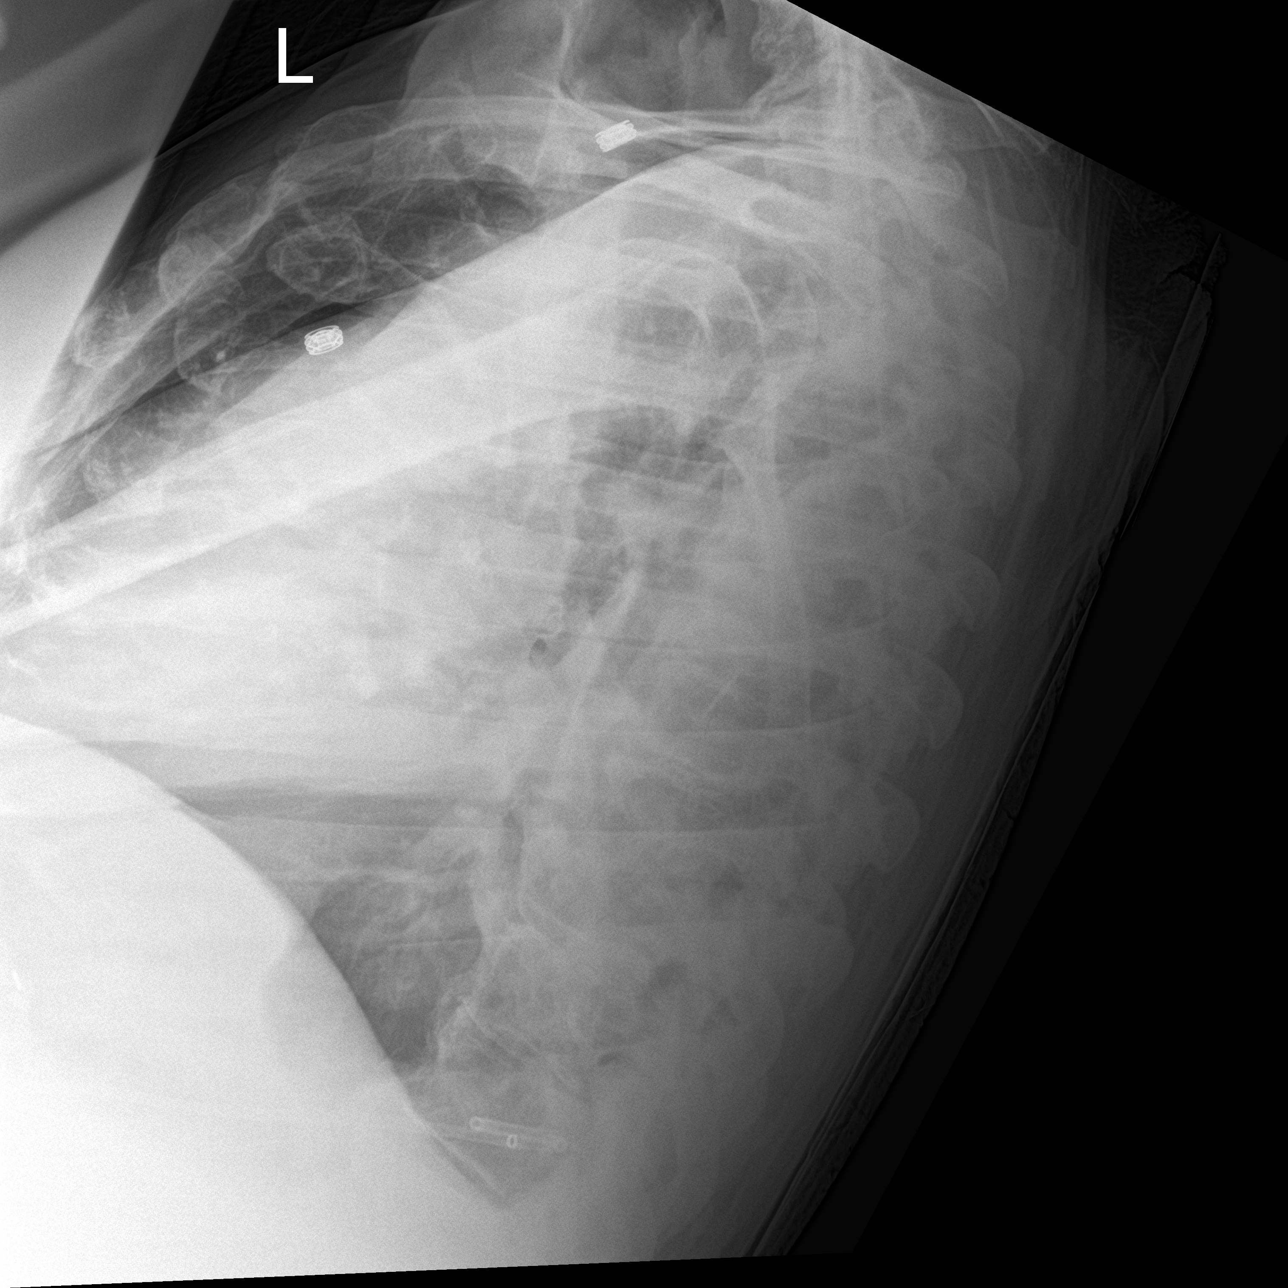

[chest ap]
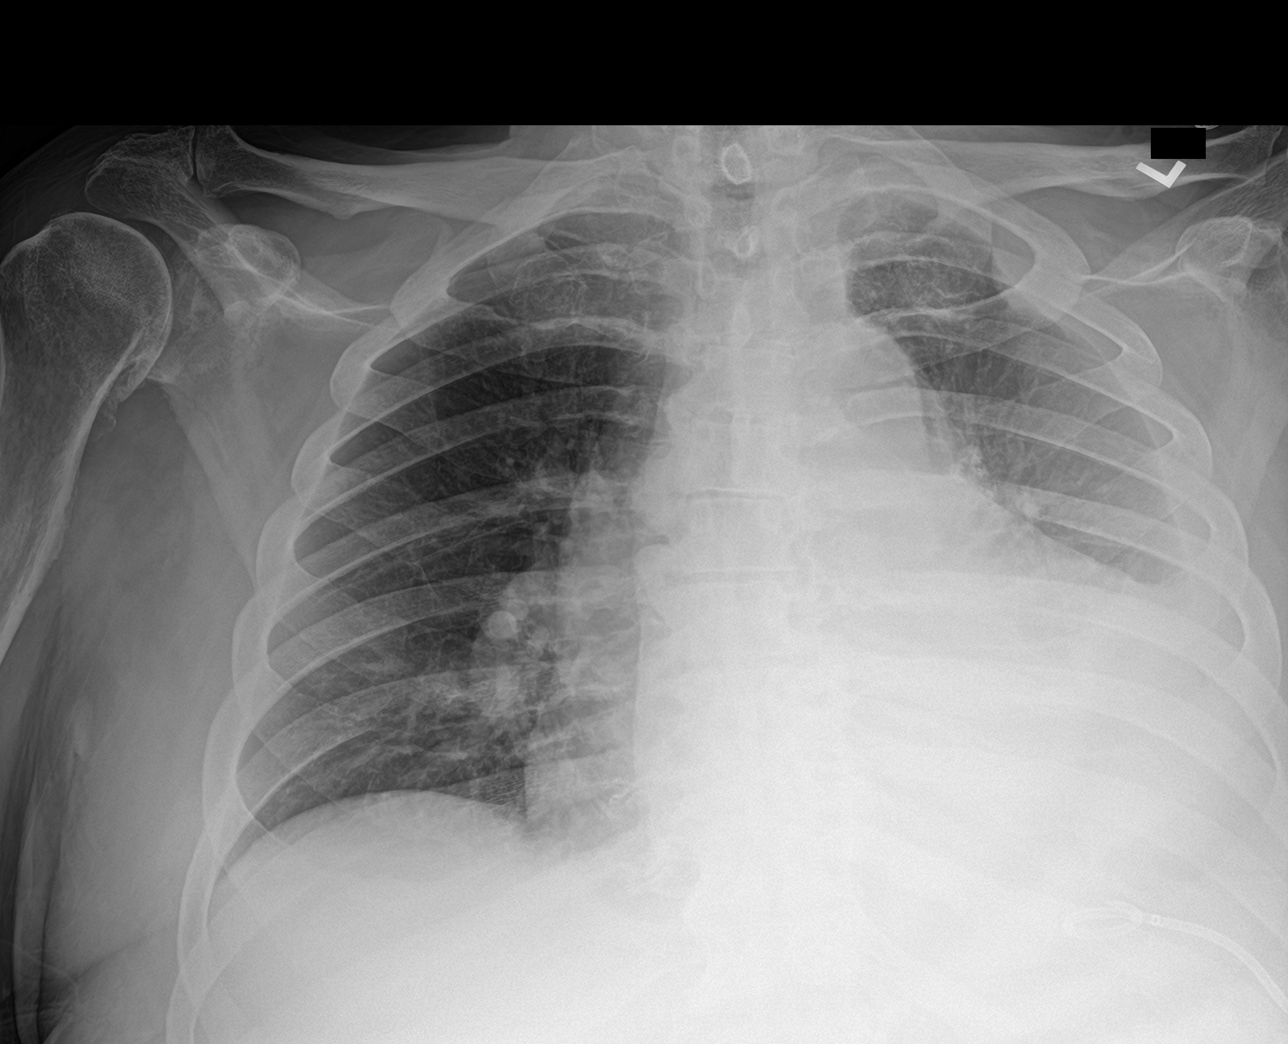

[chest lat (2 of 2)]
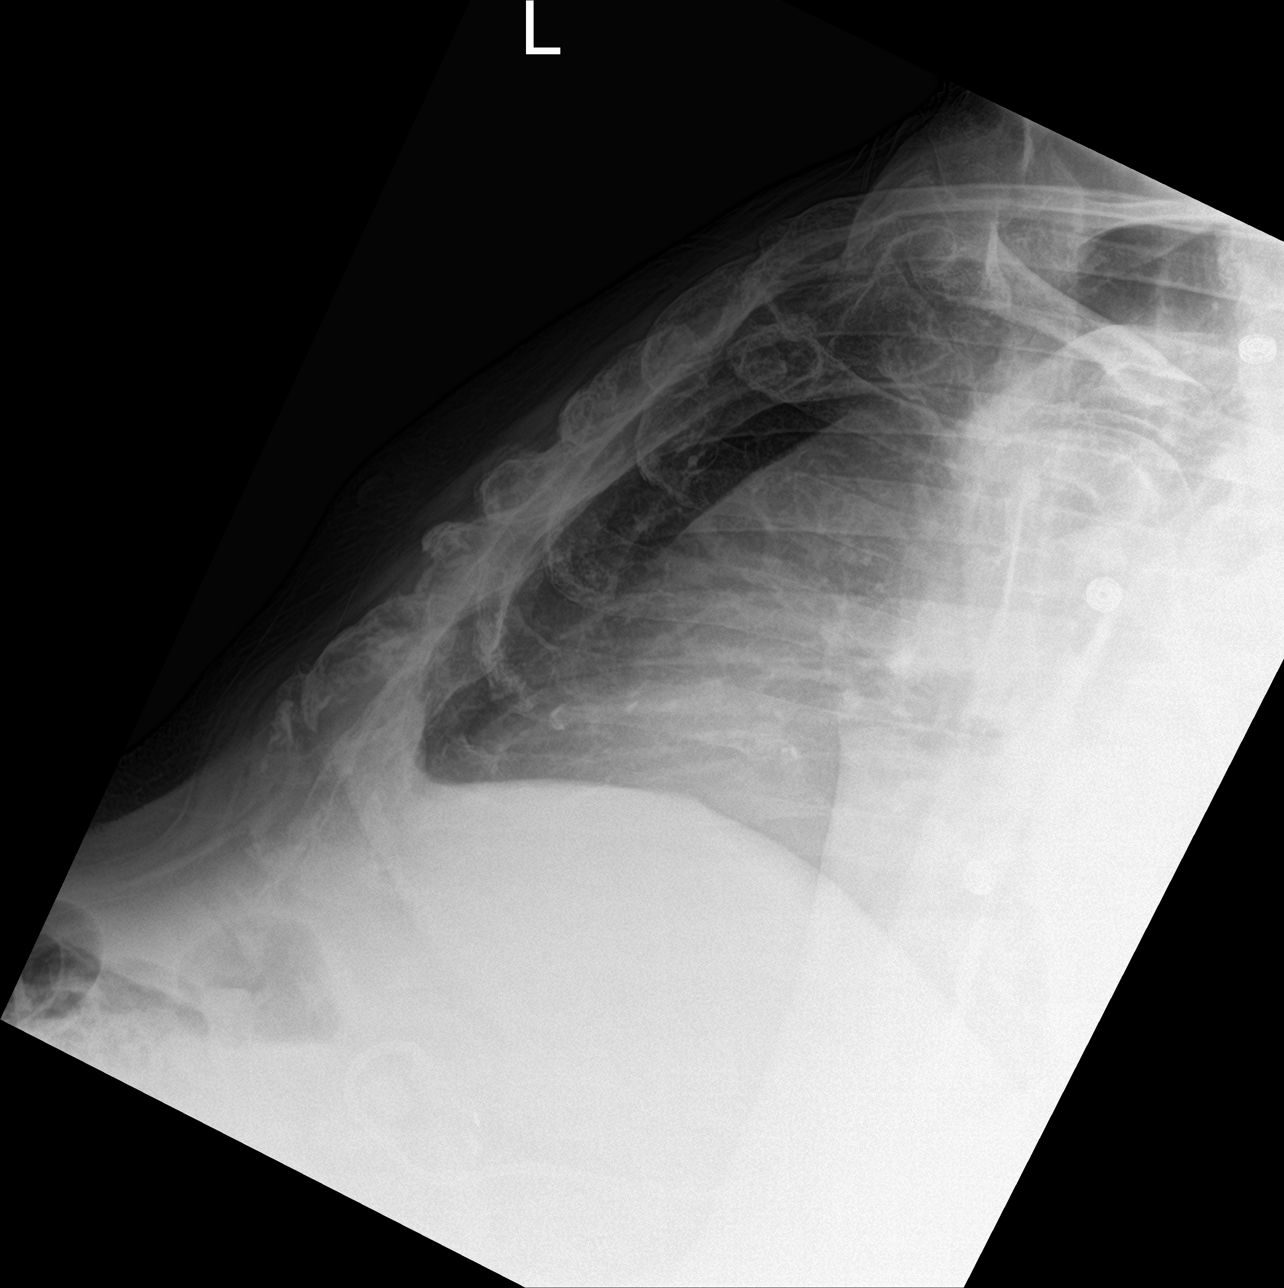

[3 of 3 positions shown; findings below may reference images not displayed]

FINDINGS: There is a new pigtail catheter in place projecting over the left
base.

The heart is markedly enlarged, unchanged.

The left pleural effusion has decreased in size following chest tube
placement, and there is improved aeration of the left upper lobe.
The right lung is clear. There is no right effusion. There is no
pneumothorax.

There is no acute osseous abnormality.
IMPRESSION: Decreased size of the left pleural effusion following pigtail
catheter placement.

## 2021-03-12 MED ORDER — COVID-19MRNA BIVAL VACC PFIZER 30 MCG/0.3ML IM SUSP
0.3000 mL | Freq: Once | INTRAMUSCULAR | Status: DC
  Filled 2021-03-12: qty 0.3

## 2021-03-12 MED ORDER — SODIUM CHLORIDE 0.9 % IV SOLN
3.0000 g | Freq: Four times a day (QID) | INTRAVENOUS | Status: DC
  Administered 2021-03-13 (×4): 3 g via INTRAVENOUS
  Filled 2021-03-12 (×4): qty 8
  Filled 2021-03-12: qty 3
  Filled 2021-03-12: qty 8

## 2021-03-12 NOTE — Progress Notes (Signed)
PROGRESS NOTE  Roy [name]. DPO:[id number] DOB: 10/12/1945 DOA: 03/07/2021 PCP: Rigoberto Noel, MD  Brief History   Roy [name]. is a 75 y.o. male with medical history significant for paraplegia secondary to spinal stenosis who is bedbound, angioedema from ACE inhibitor status post intubation recently a month ago recent pneumonia about 6 weeks ago, diabetes mellitus, coronary artery disease, nonischemic cardiomyopathy who presented to the emergency department weeks hypotension with syncope shortness of breath and weakness.  He stated that his blood pressure was 62/42 and oxygen was 78 to 81% yesterday but patient refused to go to the ER when EMS was called,  today he presented to the emergency department due to worsening shortness of breath.  History was mostly obtained from wife who is at bedside.  She said she is a retired Scientist, clinical (histocompatibility and immunogenetics).   Pt is awaiting transfer to [facility] for procedure to instil thrombolytics through chest tube. Although CT has allowed for removal of a large amount of the pleural effusion. What remains is loculated and requires thrombolytics to resolve it. The patient states that he is feeling much better. Pt is now on room air.  Consultants  PCCM   Procedures  CT placement  Antibiotics   Anti-infectives (From admission, onward)    Start     Dose/Rate Route Frequency Ordered Stop   03/13/21 0600  Ampicillin-Sulbactam (UNASYN) 3 g in sodium chloride 0.9 % 100 mL IVPB        3 g 200 mL/hr over 30 Minutes Intravenous Every 6 hours 03/12/21 1240     03/11/21 1000  azithromycin (ZITHROMAX) tablet 500 mg  Status:  Discontinued        500 mg Oral Daily 03/11/21 0755 03/12/21 1240   03/08/21 1000  cefTRIAXone (ROCEPHIN) 1 g in sodium chloride 0.9 % 100 mL IVPB  Status:  Discontinued        1 g 200 mL/hr over 30 Minutes Intravenous Every 24 hours 03/07/21 1457 03/12/21 1240   03/08/21 1000  azithromycin (ZITHROMAX) 500 mg in sodium chloride 0.9 % 250 mL IVPB  Status:   Discontinued        500 mg 250 mL/hr over 60 Minutes Intravenous Every 24 hours 03/07/21 1457 03/11/21 0755   03/07/21 1215  cefTRIAXone (ROCEPHIN) 1 g in sodium chloride 0.9 % 100 mL IVPB        1 g 200 mL/hr over 30 Minutes Intravenous  Once 03/07/21 1203 03/07/21 1554   03/07/21 1215  azithromycin (ZITHROMAX) 500 mg in sodium chloride 0.9 % 250 mL IVPB        500 mg 250 mL/hr over 60 Minutes Intravenous  Once 03/07/21 1203 03/07/21 2111        Subjective  The patient is resting comfortably. No new complaints.  Objective   Vitals:  Vitals:   03/12/21 1500 03/12/21 1608  BP: 115/60   Pulse:  (!) 46  Resp: 18   Temp: 97.9 F (36.6 C)   SpO2: 99%     Exam:  Constitutional:  The patient is awake, alert, and oriented x 3. No acute distress. Respiratory:  No increased work of breathing. No wheezes, rales, or rhonchi No tactile fremitus Cardiovascular:  Regular rate and rhythm No murmurs, ectopy, or gallups. No lateral PMI. No thrills. Abdomen:  Abdomen is soft, non-tender, non-distended No hernias, masses, or organomegaly Normoactive bowel sounds.  Musculoskeletal:  No cyanosis, clubbing, or edema Skin:  No rashes, lesions, ulcers palpation of skin: no induration or  nodules Neurologic:  CN 2-12 intact Sensation all 4 extremities intact Psychiatric:  Mental status Mood, affect appropriate Orientation to person, place, time  judgment and insight appear intact  I have personally reviewed the following:   Today's Data  Vitals  Lab Data  CBC, BMP  Micro Data  AFB - pending Pleural fluid - pending Fungal - none seen  Imaging  CT chest with contrast CXR  Cardiology Data  EKG  Scheduled Meds:  baclofen  5 mg Oral TID   brimonidine  1 drop Right Eye BID   carvedilol  6.25 mg Oral BID   Chlorhexidine Gluconate Cloth  6 each Topical Daily   [START ON 03/13/2021] COVID-19 mRNA bivalent vaccine (Pfizer)  0.3 mL Intramuscular ONCE-1600   enoxaparin  (LOVENOX) injection  0.5 mg/kg Subcutaneous Q24H   famotidine  20 mg Oral QPM   gabapentin  300 mg Oral QHS   latanoprost  1 drop Right Eye QHS   simvastatin  20 mg Oral q1800   tamsulosin  0.4 mg Oral QPC supper   cyanocobalamin  2,000 mcg Oral Daily   Continuous Infusions:  [START ON 03/13/2021] ampicillin-sulbactam (UNASYN) IV      Principal Problem:   Recurrent left pleural effusion   LOS: 5 days   A & P  Large left pleural effusion with Empyema S/p chest tube placement on 11/28 Subtotal atelectasis of LEFT lung --large amount of output from chest tube, including purulent material --pulm consulted, Dr. Karna Christmas  -Glucose <10 additional studies in process Plan: --cont chest tube per pulm management --per pulm, need further evaluation with thoracic surgery for tPA/dornase infusion BID x3 days. --initiate transfer to [facility]   Likely left PNA --WBC 32.9, procal 0.61.   --cont ceftriaxone and azithro   Acute hypoxemic respiratory failure --tele in the ED showed O2 sats in mid 80's, pt needed 2-3L [place]. --Continue supplemental O2 to keep sats >=90%, wean as tolerated  Urinary tract infection, ruled out --denied dysuria or suprapubic tenderness.  UA obtained from the Foley bag.   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 Patient is completely bedbound since October 2021 --partial air mattress  Nonischemic cardiomyopathy.   --per wife, EF used to be 17%, but has improved to 60-65% a year ago.  Not on diuretic at home. --cont home coreg  Hyponatremia --Na of 125 on presentation.   --oral hydration.   DM2 --recent A1c 6.1, too low for his age --BG so far have been wnl --d/c'ed fingersticks and insulin     DVT prophylaxis: Lovenox SQ Code Status: DNR  Family Communication: wife updated at bedside today Level of care: Med-Surg Dispo:    The patient is from: home Anticipated d/c is to: [facility] Anticipated d/c date is: whenever bed available     Nial Hawe, DO [facility] Direct contact: see [email or web address]  7PM-7AM contact night coverage as above 03/12/2021, 6:58 PM  LOS: 5 days

## 2021-03-12 NOTE — Progress Notes (Signed)
Pulmonary Medicine          Date: 03/12/2021,   MRN# [id number] Roy [name]. 1945-08-07     AdmissionWeight: 103.9 kg                 CurrentWeight: 103.9 kg   Refering physician: Dr Fran Lowes   CHIEF COMPLAINT:   Left large Pleural effusion - chest tube management   HISTORY OF PRESENT ILLNESS   This is a 75yo M with paraplegia due to spinal stenosis, angioedema from Ace I, had pna 6 wks ago, has hx of Dm , CAD, NICM came in with doe sob with shock physiology and hypoxemia.  He was intubated last admission for hypoxemic respiratory failure. His CT chest showed large pleural efusion.  He did reports fevers prior to admission. His urine on admission appreared to be infected with abnormal UA. He was placed on antibiotics and chest tube was ordered for possible empyema/parapneumonic effusion. PCCM consultation for further eval and chest tube management.  Patient is stable post procedure with wife at bedside we reviewed natural history of pleural effusion and possible empyema with additional testing in progress.   03/10/21- patient appears to be stable on room air.  Thus far studies are still in process.  From available results there is a concerning finding of glucose <20 in pleural fluid often indicative of empyema.  Patient is DNR but wife is agreable to tpA/Dornase infusion via chest tube or possible decortication.  This would require transfer to [facility] in [place].   03/12/21-  patient is on room air, states he used nasal canula intermittently but feels fine, no chest discomfort. He had mild hypoxemia nocturnally ongoing with OSA. Reviewed blood work with wife and patient . Still plan for transfer to [facility] to intrapleural tPA/dornase  PAST MEDICAL HISTORY   Past Medical History:  Diagnosis Date   Bedbound    Bilateral shoulder pain    CHF (congestive heart failure) (HCC)    Coronary artery disease    Diabetes mellitus without complication (HCC)    GERD (gastroesophageal reflux  disease)    Glaucoma    HLD (hyperlipidemia)    Hypertension    Lumbar stenosis    Non-ischemic cardiomyopathy (HCC)    PVC (premature ventricular contraction)    Sleep apnea    H/O NO CPAP IN 18 YRS     SURGICAL HISTORY   Past Surgical History:  Procedure Laterality Date   ANTERIOR CERVICAL DECOMP/DISCECTOMY FUSION N/A 01/29/2020   Procedure: ANTERIOR CERVICAL DECOMPRESSION/DISCECTOMY FUSION 1 LEVEL C3/4;  Surgeon: [name], MD;  Location: [facility] ORS;  Service: Neurosurgery;  Laterality: N/A;   arm surgery Right    4x surgery as a child, cut arm falling through glass window    HYDROCELE EXCISION / REPAIR     POSTERIOR CERVICAL FUSION/FORAMINOTOMY N/A 05/06/2020   Procedure: C3-4 LAMINECTOMY & C3-4 FUSION;  Surgeon: [name], MD;  Location: [facility] ORS;  Service: Neurosurgery;  Laterality: N/A;   REPLACEMENT TOTAL KNEE Right      FAMILY HISTORY   History reviewed. No pertinent family history.   SOCIAL HISTORY   Social History   Tobacco Use   Smoking status: Never   Smokeless tobacco: Never  Vaping Use   Vaping Use: Never used  Substance Use Topics   Alcohol use: No   Drug use: Never     MEDICATIONS    Home Medication:    Current Medication:  Current Facility-Administered Medications:  acetaminophen (TYLENOL) tablet 650 mg, 650 mg, Oral, Q6H PRN, 650 mg at 03/11/21 1713 **OR** acetaminophen (TYLENOL) suppository 650 mg, 650 mg, Rectal, Q6H PRN, Myrtie Neither, MD   azithromycin (ZITHROMAX) tablet 500 mg, 500 mg, Oral, Daily, Tressie Ellis, RPH, 500 mg at 03/11/21 7322   baclofen (LIORESAL) tablet 5 mg, 5 mg, Oral, TID, Myrtie Neither, MD, 5 mg at 03/11/21 2108   brimonidine (ALPHAGAN) 0.2 % ophthalmic solution 1 drop, 1 drop, Right Eye, BID, Myrtie Neither, MD, 1 drop at 03/11/21 2106   calcium carbonate (TUMS - dosed in mg elemental calcium) chewable tablet 200 mg of elemental calcium, 1 tablet, Oral, BID PRN, Myrtie Neither, MD   carvedilol  (COREG) tablet 6.25 mg, 6.25 mg, Oral, BID, Myrtie Neither, MD, 6.25 mg at 03/11/21 2105   cefTRIAXone (ROCEPHIN) 1 g in sodium chloride 0.9 % 100 mL IVPB, 1 g, Intravenous, Q24H, Myrtie Neither, MD, Last Rate: 200 mL/hr at 03/11/21 0955, 1 g at 03/11/21 0254   Chlorhexidine Gluconate Cloth 2 % PADS 6 each, 6 each, Topical, Daily, Darlin Priestly, MD, 6 each at 03/11/21 0957   [START ON 03/13/2021] COVID-19 mRNA bivalent vaccine (Pfizer) injection 0.3 mL, 0.3 mL, Intramuscular, ONCE-1600, Swayze, Ava, DO   enoxaparin (LOVENOX) injection 52.5 mg, 0.5 mg/kg, Subcutaneous, Q24H, Myrtie Neither, MD, 52.5 mg at 03/11/21 2106   famotidine (PEPCID) tablet 20 mg, 20 mg, Oral, QPM, Myrtie Neither, MD, 20 mg at 03/11/21 1722   gabapentin (NEURONTIN) capsule 300 mg, 300 mg, Oral, QHS, Myrtie Neither, MD, 300 mg at 03/11/21 2105   latanoprost (XALATAN) 0.005 % ophthalmic solution 1 drop, 1 drop, Right Eye, QHS, Myrtie Neither, MD, 1 drop at 03/11/21 2108   polyethylene glycol (MIRALAX / GLYCOLAX) packet 17 g, 17 g, Oral, Daily PRN, Myrtie Neither, MD   simvastatin (ZOCOR) tablet 20 mg, 20 mg, Oral, q1800, Myrtie Neither, MD, 20 mg at 03/11/21 1722   tamsulosin (FLOMAX) capsule 0.4 mg, 0.4 mg, Oral, QPC supper, Myrtie Neither, MD, 0.4 mg at 03/11/21 1722   vitamin B-12 (CYANOCOBALAMIN) tablet 2,000 mcg, 2,000 mcg, Oral, Daily, Myrtie Neither, MD, 2,000 mcg at 03/11/21 2706    ALLERGIES   Ace inhibitors and Lisinopril     REVIEW OF SYSTEMS    Review of Systems:  Gen:  Denies  fever, sweats, chills weigh loss  HEENT: Denies blurred vision, double vision, ear pain, eye pain, hearing loss, nose bleeds, sore throat Cardiac:  No dizziness, chest pain or heaviness, chest tightness,edema Resp:   Denies cough or sputum porduction, shortness of breath,wheezing, hemoptysis,  Gi: Denies swallowing difficulty, stomach pain, nausea or vomiting, diarrhea, constipation, bowel incontinence Gu:  Denies  bladder incontinence, burning urine Ext:   Denies Joint pain, stiffness or swelling Skin: Denies  skin rash, easy bruising or bleeding or hives Endoc:  Denies polyuria, polydipsia , polyphagia or weight change Psych:   Denies depression, insomnia or hallucinations   Other:  All other systems negative   VS: BP 123/78 (BP Location: Left Arm)   Pulse 70   Temp 97.7 F (36.5 C) (Oral)   Resp 18   Ht 6' (1.829 m)   Wt 103.9 kg   SpO2 95%   BMI 31.06 kg/m      PHYSICAL EXAM    GENERAL:NAD, no fevers, chills, no weakness no fatigue HEAD: Normocephalic, atraumatic.  EYES: Pupils equal, round, reactive to light. Extraocular muscles intact. No scleral icterus.  MOUTH: Moist mucosal membrane. Dentition intact. No abscess noted.  EAR, NOSE, THROAT: Clear without exudates. No external lesions.  NECK: Supple. No thyromegaly. No nodules. No JVD.  PULMONARY:Decreased air entry on left with rhonchi bilatearlly.  CARDIOVASCULAR: S1 and S2. Regular rate and rhythm. No murmurs, rubs, or gallops. No edema. Pedal pulses 2+ bilaterally.  GASTROINTESTINAL: Soft, nontender, nondistended. No masses. Positive bowel sounds. No hepatosplenomegaly.  MUSCULOSKELETAL: No swelling, clubbing, or edema. Range of motion full in all extremities.  NEUROLOGIC: Cranial nerves II through XII are intact. No gross focal neurological deficits. Sensation intact. Reflexes intact.  SKIN: No ulceration, lesions, rashes, or cyanosis. Skin warm and dry. Turgor intact.  PSYCHIATRIC: Mood, affect within normal limits. The patient is awake, alert and oriented x 3. Insight, judgment intact.       IMAGING    DG Chest 2 View  Result Date: 03/07/2021 CLINICAL DATA:  Hypotension, shortness of breath, weakness, angioedema, bundle branch block EXAM: CHEST - 2 VIEW COMPARISON:  01/09/2021 FINDINGS: Enlargement of cardiac silhouette. Large LEFT pleural effusion with subtotal atelectasis of LEFT lung. Underlying infiltrate and  other abnormalities including tumor not excluded in this setting. Minimal atelectasis at RIGHT base. Remaining RIGHT lung clear. No pneumothorax or acute osseous findings. Osseous demineralization with degenerative changes of the glenohumeral joints and thoracic spine. IMPRESSION: Large LEFT pleural effusion with subtotal atelectasis of LEFT lung. Enlargement of cardiac silhouette with minimal RIGHT basilar atelectasis. Electronically Signed   By: Ulyses Southward M.D.   On: 03/07/2021 11:16   CT Chest W Contrast  Result Date: 03/07/2021 CLINICAL DATA:  Pleural effusion, hypotension, shortness of breath, weakness EXAM: CT CHEST WITH CONTRAST TECHNIQUE: Multidetector CT imaging of the chest was performed during intravenous contrast administration. CONTRAST:  58mL OMNIPAQUE IOHEXOL 300 MG/ML  SOLN IV COMPARISON:  Chest radiograph 03/07/2021 FINDINGS: Cardiovascular: Atherosclerotic calcifications aorta, coronary arteries, and proximal great vessels. 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 Mild enlargement of cardiac chambers. Minimal pericardial effusion. Mediastinum/Nodes: Base of cervical region normal appearance. Esophagus unremarkable. 11 mm short axis AP window lymph node image 80. Multiple additional normal sized mediastinal lymph nodes. Fluid collection at the RIGHT paratracheal region 24 x 18 mm likely small nonspecific cyst, less likely fluid within a superior pericardial recess. Lungs/Pleura: Partially loculated large LEFT pleural effusion. Complete atelectasis LEFT lower lobe with subtotal atelectasis of LEFT upper lobe. Fluid-filled lower lobe airways. Small RIGHT pleural effusion and minimal basilar atelectasis. Upper Abdomen: Liver, spleen, adrenal glands, pancreas, and gallbladder unremarkable. Small cysts at upper pole pole of RIGHT kidney. No acute upper abdominal findings. Musculoskeletal: Advanced degenerative changes BILATERAL glenohumeral joints and LEFT sternoclavicular  joint. Advanced degenerative changes of the thoracic spine. IMPRESSION: Large partially loculated LEFT pleural effusion with complete atelectasis of LEFT lower lobe and subtotal atelectasis of LEFT upper lobe. Small RIGHT pleural effusion and minimal basilar atelectasis. Nonspecific mildly enlarged AP window lymph node. Extensive atherosclerotic disease changes including coronary arteries. Aortic Atherosclerosis (ICD10-I70.0). Electronically Signed   By: Ulyses Southward M.D.   On: 03/07/2021 12:34   CT [facility] PLEURAL DRAIN W/INDWELL CATH W/IMG GUIDE  Result Date: 03/10/2021 INDICATION: Large left pleural effusion EXAM: CT-guided placement of left-sided chest tube MEDICATIONS: None ANESTHESIA/SEDATION: Anxiolysis and local analgesia only COMPLICATIONS: None immediate. PROCEDURE: Informed written consent was obtained from the patient after a thorough discussion of the procedural risks, benefits and alternatives. All questions were addressed. Maximal Sterile Barrier Technique was utilized including caps, mask, sterile gowns, sterile gloves, sterile drape, hand hygiene and skin antiseptic. A timeout was performed prior  to the initiation of the procedure. The patient was placed supine on the exam table. Limited CT of the chest was performed for planning purposes. This again demonstrated a large left pleural effusion. Skin entry site was marked, and the overlying skin was prepped and draped in a standard sterile fashion. Local analgesia was obtained with 1% lidocaine. Under intermittent CT fluoroscopy, a 10 French locking multipurpose drainage catheter was advanced into the left pleural fluid using trocar technique. Location was confirmed with CT imaging and return of clear, serous fluid. A sample was collected and sent to the lab for analysis. The catheter was secured to the skin using silk suture and a dressing. It was attached to pleura vac drainage system connected to low continuous suction. The patient tolerated the  procedure well without immediate complication. IMPRESSION: 1. Successful CT-guided placement of a 10 French locking multipurpose drainage catheter in the left pleural space. 2. Left pleural drainage catheter attached to Pleur-evac drainage system to low continuous suction. Further recommendations per pulmonology/thoracic surgery. Please call IR with any further questions. Electronically Signed   By: Olive Bass M.D.   On: 03/10/2021 09:20   3   ASSESSMENT/PLAN   Left empyema S/p thoracentesis and chest tube placement  -fluid studies need to be collected and reviewed  -possible transudative due to advanced CHF with previous EF <20% vs paerapneumonic vs emphyema -patient has active fluid draing from chest tube  -he is clinically improved -fluid studies are not available for review yet -chest tube management-  tube is patent, forced expiratory maneuver without air leak, fluid active, tube intact. -Glucose <10 additional studies in process, recommend further evaluation with thoracic surgery for tPA/dornase infusion BID x3 days.  Reviewed with family.  -patient is stable 03/12/21 - active mucopurulent drainage >200 cc overnight    Thank you for allowing me to participate in the care of this patient.  Total face to face encounter time for this patient visit was >45 min. >50% of the time was  spent in counseling and coordination of care.   Patient/Family are satisfied with care plan and all questions have been answered.  This document was prepared using Dragon voice recognition software and may include unintentional dictation errors.     Vida Rigger, M.D.  Division of Pulmonary & Critical Care Medicine  [facility]

## 2021-03-13 DIAGNOSIS — E440 Moderate protein-calorie malnutrition: Secondary | ICD-10-CM | POA: Insufficient documentation

## 2021-03-13 LAB — CBC
HCT: 31.4 % — ABNORMAL LOW (ref 39.0–52.0)
Hemoglobin: 10.3 g/dL — ABNORMAL LOW (ref 13.0–17.0)
MCH: 30.8 pg (ref 26.0–34.0)
MCHC: 32.8 g/dL (ref 30.0–36.0)
MCV: 94 fL (ref 80.0–100.0)
Platelets: 426 10*3/uL — ABNORMAL HIGH (ref 150–400)
RBC: 3.34 MIL/uL — ABNORMAL LOW (ref 4.22–5.81)
RDW: 13 % (ref 11.5–15.5)
WBC: 14.8 10*3/uL — ABNORMAL HIGH (ref 4.0–10.5)
nRBC: 0 % (ref 0.0–0.2)

## 2021-03-13 LAB — BASIC METABOLIC PANEL
Anion gap: 5 (ref 5–15)
BUN: 27 mg/dL — ABNORMAL HIGH (ref 8–23)
CO2: 29 mmol/L (ref 22–32)
Calcium: 9.2 mg/dL (ref 8.9–10.3)
Chloride: 100 mmol/L (ref 98–111)
Creatinine, Ser: 0.49 mg/dL — ABNORMAL LOW (ref 0.61–1.24)
GFR, Estimated: >60 mL/min (ref >60)
Glucose, Bld: 194 mg/dL — ABNORMAL HIGH (ref 70–99)
Potassium: 4.1 mmol/L (ref 3.5–5.1)
Sodium: 134 mmol/L — ABNORMAL LOW (ref 135–145)

## 2021-03-13 LAB — CHOLESTEROL, BODY FLUID: Cholesterol, Fluid: 78 mg/dL

## 2021-03-13 LAB — BODY FLUID CULTURE W GRAM STAIN
Culture: NO GROWTH
Gram Stain: NONE SEEN

## 2021-03-13 LAB — MAGNESIUM: Magnesium: 1.5 mg/dL — ABNORMAL LOW (ref 1.7–2.4)

## 2021-03-13 MED ORDER — ADULT MULTIVITAMIN W/MINERALS CH
1.0000 | ORAL_TABLET | Freq: Every day | ORAL | Status: DC
  Administered 2021-03-13: 1 via ORAL
  Filled 2021-03-13: qty 1

## 2021-03-13 MED ORDER — ENSURE ENLIVE PO LIQD
237.0000 mL | Freq: Three times a day (TID) | ORAL | Status: DC
  Administered 2021-03-13 (×2): 237 mL via ORAL

## 2021-03-13 NOTE — Progress Notes (Signed)
Initial Nutrition Assessment  DOCUMENTATION CODES:  Non-severe (moderate) malnutrition in context of chronic illness, Obesity unspecified  INTERVENTION:  Continue regular diet.  Obtain updated measured weight.  Add Ensure Plus High Protein po TID, each supplement provides 350 kcal and 20 grams of protein.   Add MVI with minerals daily.  Encourage PO and supplement intake.  NUTRITION DIAGNOSIS: Moderate Malnutrition related to chronic illness (CAD) as evidenced by mild fat depletion, mild muscle depletion, moderate muscle depletion.  GOAL:  Patient will meet greater than or equal to 90% of their needs  MONITOR:  PO intake, Supplement acceptance, Labs, Weight trends, I & O's  REASON FOR ASSESSMENT:  Low Braden    ASSESSMENT:  75 yo male with a PMH of paraplegia secondary to spinal stenosis who is bedbound, angioedema from ACE inhibitor status post intubation recently a month ago recent pneumonia about 6 weeks ago, diabetes mellitus, coronary artery disease, nonischemic cardiomyopathy who presented to the emergency department weeks hypotension with syncope shortness of breath and weakness. Admitted with recurrent L pleural effusion.  Per MD note yesterday, "Pt is awaiting transfer to [facility] for procedure to instil thrombolytics through chest tube. Although CT has allowed for removal of a large amount of the pleural effusion. What remains is loculated and requires thrombolytics to resolve it."  Spoke with pt and wife at bedside. Most history obtained from wife. She reports that he eats very well usually - ate well at Thanksgiving then did not eat the 3 days after. Once admitted, he is eating again very well (reports he will eat anything).  Per Epic, pt has eaten an average of 70% of the last 7 meals. Pt with good meal documentation as well.  Per wife, pt's weight does not appear to have changed, but she says that he is bedbound regularly. Pt's weight appears to be stated. RD to order  measured weight to determine any changes.  Per Epic, pt has lost ~13.5 lbs (5.5%) in the last 10 months, which is not necessarily significant for the time frame.  Of note, pt with mild BLE edema, which may be masking loss in legs.  Recommend Ensure TID and MVI with minerals daily.  Chest Tube Output: 300 ml so far today  Medications: reviewed; Pepcid, Vitamin B12  Labs: reviewed; Na 134 (L), Glucose 194 (H) HbA1c: 6% (03/11/2021)  NUTRITION - FOCUSED PHYSICAL EXAM: Flowsheet Row Most Recent Value  Orbital Region Mild depletion  Upper Arm Region Moderate depletion  Thoracic and Lumbar Region No depletion  Buccal Region Mild depletion  Temple Region Mild depletion  Clavicle Bone Region Mild depletion  Clavicle and Acromion Bone Region Mild depletion  Scapular Bone Region Unable to assess  Dorsal Hand Moderate depletion  Patellar Region Mild depletion  Anterior Thigh Region Moderate depletion  Posterior Calf Region Moderate depletion  Edema (RD Assessment) Mild  [BLE]  Hair Reviewed  Eyes Reviewed  Mouth Reviewed  Skin Reviewed  Nails Reviewed   Diet Order:   Diet Order             Diet regular Room service appropriate? Yes; Fluid consistency: Thin  Diet effective now                  EDUCATION NEEDS:  Education needs have been addressed  Skin:  Skin Assessment: Reviewed RN Assessment  Last BM:  03/12/21 - Type 6/7, small  Height:  Ht Readings from Last 1 Encounters:  03/07/21 6' (1.829 m)   Weight:  Wt  Readings from Last 1 Encounters:  03/07/21 103.9 kg   BMI:  Body mass index is 31.06 kg/m.  Estimated Nutritional Needs:  Kcal:  2100-2300 Protein:  120-135 grams Fluid:  >2.1 L  Vertell Limber, RD, LDN (she/her/hers) Clinical Inpatient Dietitian RD Pager/After-Hours/Weekend Pager # in [place]

## 2021-03-13 NOTE — Care Management Important Message (Signed)
Important Message  Patient Details  Name: Roy [name]. MRN: [id number] Date of Birth: 10/09/1945   Medicare Important Message Given:  Other (see comment)  Pending transfer to [facility].  Medicare IM withheld at this time.   [name] 03/13/2021, 8:56 AM

## 2021-03-13 NOTE — Progress Notes (Signed)
[facility] present for transport, pt agrees. Notified receiving nurse and wife that they were present. All forms printed. Black personal bag packed in belongings bag as well. Receiving nurse aware to contact wife once he arrives at [address].

## 2021-03-13 NOTE — Progress Notes (Signed)
PROGRESS NOTE  Roy [name]. OZD:[id number] DOB: 08-24-1945 DOA: 03/07/2021 PCP: Rigoberto Noel, MD  Brief History   Roy [name]. is a 75 y.o. male with medical history significant for paraplegia secondary to spinal stenosis who is bedbound, angioedema from ACE inhibitor status post intubation recently a month ago recent pneumonia about 6 weeks ago, diabetes mellitus, coronary artery disease, nonischemic cardiomyopathy who presented to the emergency department weeks hypotension with syncope shortness of breath and weakness.  He stated that his blood pressure was 62/42 and oxygen was 78 to 81% yesterday but patient refused to go to the ER when EMS was called,  today he presented to the emergency department due to worsening shortness of breath.  History was mostly obtained from wife who is at bedside.  She said she is a retired Scientist, clinical (histocompatibility and immunogenetics).   Pt is awaiting transfer to [facility] for procedure to instil thrombolytics through chest tube. Although CT has allowed for removal of a large amount of the pleural effusion. What remains is loculated and requires thrombolytics to resolve it. The patient states that he is feeling much better. Pt is now on room air.  I have discussed the patient with bed control this am. Still awaiting bed availability. He is anxious to go home.  Consultants  PCCM  Procedures  CT placement  Antibiotics   Anti-infectives (From admission, onward)    Start     Dose/Rate Route Frequency Ordered Stop   03/13/21 0600  Ampicillin-Sulbactam (UNASYN) 3 g in sodium chloride 0.9 % 100 mL IVPB        3 g 200 mL/hr over 30 Minutes Intravenous Every 6 hours 03/12/21 1240     03/11/21 1000  azithromycin (ZITHROMAX) tablet 500 mg  Status:  Discontinued        500 mg Oral Daily 03/11/21 0755 03/12/21 1240   03/08/21 1000  cefTRIAXone (ROCEPHIN) 1 g in sodium chloride 0.9 % 100 mL IVPB  Status:  Discontinued        1 g 200 mL/hr over 30 Minutes Intravenous Every 24 hours 03/07/21  1457 03/12/21 1240   03/08/21 1000  azithromycin (ZITHROMAX) 500 mg in sodium chloride 0.9 % 250 mL IVPB  Status:  Discontinued        500 mg 250 mL/hr over 60 Minutes Intravenous Every 24 hours 03/07/21 1457 03/11/21 0755   03/07/21 1215  cefTRIAXone (ROCEPHIN) 1 g in sodium chloride 0.9 % 100 mL IVPB        1 g 200 mL/hr over 30 Minutes Intravenous  Once 03/07/21 1203 03/07/21 1554   03/07/21 1215  azithromycin (ZITHROMAX) 500 mg in sodium chloride 0.9 % 250 mL IVPB        500 mg 250 mL/hr over 60 Minutes Intravenous  Once 03/07/21 1203 03/07/21 2111        Subjective  The patient is resting comfortably. No new complaints.  Objective   Vitals:  Vitals:   03/13/21 0757 03/13/21 1509  BP: 128/61 112/72  Pulse: 69 71  Resp: 18 18  Temp: 98.6 F (37 C) 98.6 F (37 C)  SpO2: 96% 92%    Exam:  Constitutional:  The patient is awake, alert, and oriented x 3. No acute distress. Respiratory:  No increased work of breathing. No wheezes, rales, or rhonchi No tactile fremitus Cardiovascular:  Regular rate and rhythm No murmurs, ectopy, or gallups. No lateral PMI. No thrills. Abdomen:  Abdomen is soft, non-tender, non-distended No hernias, masses, or organomegaly Normoactive  bowel sounds.  Musculoskeletal:  No cyanosis, clubbing, or edema Skin:  No rashes, lesions, ulcers palpation of skin: no induration or nodules Neurologic:  CN 2-12 intact Sensation all 4 extremities intact Psychiatric:  Mental status Mood, affect appropriate Orientation to person, place, time  judgment and insight appear intact  I have personally reviewed the following:   Today's Data  Vitals  Lab Data  CBC, BMP  Micro Data  AFB - pending Pleural fluid - pending Fungal - none seen  Imaging  CT chest with contrast CXR  Cardiology Data  EKG  Scheduled Meds:  baclofen  5 mg Oral TID   brimonidine  1 drop Right Eye BID   carvedilol  6.25 mg Oral BID   Chlorhexidine Gluconate  Cloth  6 each Topical Daily   COVID-19 mRNA bivalent vaccine (Pfizer)  0.3 mL Intramuscular ONCE-1600   enoxaparin (LOVENOX) injection  0.5 mg/kg Subcutaneous Q24H   famotidine  20 mg Oral QPM   feeding supplement  237 mL Oral TID BM   gabapentin  300 mg Oral QHS   latanoprost  1 drop Right Eye QHS   multivitamin with minerals  1 tablet Oral Daily   simvastatin  20 mg Oral q1800   tamsulosin  0.4 mg Oral QPC supper   cyanocobalamin  2,000 mcg Oral Daily   Continuous Infusions:  ampicillin-sulbactam (UNASYN) IV 200 mL/hr at 03/13/21 1706    Principal Problem:   Recurrent left pleural effusion Active Problems:   Malnutrition of moderate degree   LOS: 6 days   A & P  Large left pleural effusion with Empyema S/p chest tube placement on 11/28 Subtotal atelectasis of LEFT lung --large amount of output from chest tube, including purulent material --pulm consulted, Dr. Karna Christmas  -Glucose <10 additional studies in process Plan: --cont chest tube per pulm management --per pulm, need further evaluation with thoracic surgery for tPA/dornase infusion BID x3 days. --awaiting transfer to [facility]   Likely left PNA --WBC 32.9, procal 0.61.   --cont ceftriaxone and azithro   Acute hypoxemic respiratory failure --tele in the ED showed O2 sats in mid 80's, pt needed 2-3L [place]. --Continue supplemental O2 to keep sats >=90%, wean as tolerated  Urinary tract infection, ruled out --denied dysuria or suprapubic tenderness.  UA obtained from the Foley bag.   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 Patient is completely bedbound since October 2021 --partial air mattress  Nonischemic cardiomyopathy.   --per wife, EF used to be 17%, but has improved to 60-65% a year ago.  Not on diuretic at home. --cont home coreg  Hyponatremia --Na of 125 on presentation.    --oral hydration.   DM2 --recent A1c 6.1, too low for his age --BG so far have been wnl --d/c'ed fingersticks and insulin   DVT prophylaxis: Lovenox SQ Code Status: DNR  Family Communication: wife updated at bedside today Level of care: Med-Surg Dispo:   The patient is from: home Anticipated d/c is to: [facility] Anticipated d/c date is: whenever bed available   Nadja Lina, DO [facility] Direct contact: see [email or web address]  7PM-7AM contact night coverage as above 03/13/2021, 5:47 PM  LOS: 5 days

## 2021-03-13 NOTE — Progress Notes (Signed)
Notified pt and wife of pending transfer to [facility]  4 [place] 07. MD notified of bed. Hand off report called to Darryl at [phone]. Pt and wife aware awaiting transport.

## 2021-03-14 ENCOUNTER — Inpatient Hospital Stay (HOSPITAL_COMMUNITY)
Admission: AD | Admit: 2021-03-14 | Discharge: 2021-03-18 | DRG: 177 | Disposition: A | Discharge status: Hospice | Payer: Medicare HMO | Source: Other Acute Inpatient Hospital | Attending: Internal Medicine | Admitting: Internal Medicine

## 2021-03-14 DIAGNOSIS — K219 Gastro-esophageal reflux disease without esophagitis: Secondary | ICD-10-CM | POA: Diagnosis present

## 2021-03-14 DIAGNOSIS — I10 Essential (primary) hypertension: Secondary | ICD-10-CM | POA: Diagnosis not present

## 2021-03-14 DIAGNOSIS — Z66 Do not resuscitate: Secondary | ICD-10-CM | POA: Diagnosis present

## 2021-03-14 DIAGNOSIS — D638 Anemia in other chronic diseases classified elsewhere: Secondary | ICD-10-CM | POA: Diagnosis present

## 2021-03-14 DIAGNOSIS — E1169 Type 2 diabetes mellitus with other specified complication: Secondary | ICD-10-CM | POA: Diagnosis present

## 2021-03-14 DIAGNOSIS — Z794 Long term (current) use of insulin: Secondary | ICD-10-CM

## 2021-03-14 DIAGNOSIS — R339 Retention of urine, unspecified: Secondary | ICD-10-CM | POA: Diagnosis present

## 2021-03-14 DIAGNOSIS — I4510 Unspecified right bundle-branch block: Secondary | ICD-10-CM | POA: Diagnosis present

## 2021-03-14 DIAGNOSIS — E66811 Obesity, class 1: Secondary | ICD-10-CM | POA: Diagnosis present

## 2021-03-14 DIAGNOSIS — J869 Pyothorax without fistula: Secondary | ICD-10-CM | POA: Diagnosis present

## 2021-03-14 DIAGNOSIS — Z7401 Bed confinement status: Secondary | ICD-10-CM | POA: Diagnosis not present

## 2021-03-14 DIAGNOSIS — G8220 Paraplegia, unspecified: Secondary | ICD-10-CM | POA: Diagnosis present

## 2021-03-14 DIAGNOSIS — R627 Adult failure to thrive: Secondary | ICD-10-CM | POA: Diagnosis present

## 2021-03-14 DIAGNOSIS — H409 Unspecified glaucoma: Secondary | ICD-10-CM | POA: Diagnosis present

## 2021-03-14 DIAGNOSIS — J90 Pleural effusion, not elsewhere classified: Secondary | ICD-10-CM

## 2021-03-14 DIAGNOSIS — E785 Hyperlipidemia, unspecified: Secondary | ICD-10-CM | POA: Diagnosis present

## 2021-03-14 DIAGNOSIS — I110 Hypertensive heart disease with heart failure: Secondary | ICD-10-CM | POA: Diagnosis present

## 2021-03-14 DIAGNOSIS — I2510 Atherosclerotic heart disease of native coronary artery without angina pectoris: Secondary | ICD-10-CM | POA: Diagnosis present

## 2021-03-14 DIAGNOSIS — I5032 Chronic diastolic (congestive) heart failure: Secondary | ICD-10-CM | POA: Diagnosis present

## 2021-03-14 DIAGNOSIS — Z791 Long term (current) use of non-steroidal anti-inflammatories (NSAID): Secondary | ICD-10-CM

## 2021-03-14 DIAGNOSIS — Z4682 Encounter for fitting and adjustment of non-vascular catheter: Secondary | ICD-10-CM

## 2021-03-14 DIAGNOSIS — J9601 Acute respiratory failure with hypoxia: Secondary | ICD-10-CM | POA: Diagnosis present

## 2021-03-14 DIAGNOSIS — J189 Pneumonia, unspecified organism: Secondary | ICD-10-CM | POA: Diagnosis present

## 2021-03-14 DIAGNOSIS — Z888 Allergy status to other drugs, medicaments and biological substances status: Secondary | ICD-10-CM

## 2021-03-14 DIAGNOSIS — G4730 Sleep apnea, unspecified: Secondary | ICD-10-CM | POA: Diagnosis present

## 2021-03-14 DIAGNOSIS — J9811 Atelectasis: Secondary | ICD-10-CM | POA: Diagnosis present

## 2021-03-14 DIAGNOSIS — E669 Obesity, unspecified: Secondary | ICD-10-CM | POA: Diagnosis present

## 2021-03-14 DIAGNOSIS — Z79899 Other long term (current) drug therapy: Secondary | ICD-10-CM

## 2021-03-14 DIAGNOSIS — E871 Hypo-osmolality and hyponatremia: Secondary | ICD-10-CM | POA: Diagnosis present

## 2021-03-14 DIAGNOSIS — Z981 Arthrodesis status: Secondary | ICD-10-CM

## 2021-03-14 DIAGNOSIS — Z96651 Presence of right artificial knee joint: Secondary | ICD-10-CM | POA: Diagnosis present

## 2021-03-14 DIAGNOSIS — Z7982 Long term (current) use of aspirin: Secondary | ICD-10-CM

## 2021-03-14 DIAGNOSIS — Z8249 Family history of ischemic heart disease and other diseases of the circulatory system: Secondary | ICD-10-CM | POA: Diagnosis not present

## 2021-03-14 DIAGNOSIS — I428 Other cardiomyopathies: Secondary | ICD-10-CM | POA: Diagnosis present

## 2021-03-14 DIAGNOSIS — Z7984 Long term (current) use of oral hypoglycemic drugs: Secondary | ICD-10-CM

## 2021-03-14 DIAGNOSIS — Z9689 Presence of other specified functional implants: Secondary | ICD-10-CM

## 2021-03-14 LAB — CBC WITH DIFFERENTIAL/PLATELET
Abs Immature Granulocytes: 0.5 10*3/uL — ABNORMAL HIGH (ref 0.00–0.07)
Basophils Absolute: 0.1 10*3/uL (ref 0.0–0.1)
Basophils Relative: 1 %
Eosinophils Absolute: 0.3 10*3/uL (ref 0.0–0.5)
Eosinophils Relative: 1 %
HCT: 33.2 % — ABNORMAL LOW (ref 39.0–52.0)
Hemoglobin: 10.8 g/dL — ABNORMAL LOW (ref 13.0–17.0)
Immature Granulocytes: 3 %
Lymphocytes Relative: 8 %
Lymphs Abs: 1.6 10*3/uL (ref 0.7–4.0)
MCH: 30.9 pg (ref 26.0–34.0)
MCHC: 32.5 g/dL (ref 30.0–36.0)
MCV: 95.1 fL (ref 80.0–100.0)
Monocytes Absolute: 1.2 10*3/uL — ABNORMAL HIGH (ref 0.1–1.0)
Monocytes Relative: 6 %
Neutro Abs: 15.1 10*3/uL — ABNORMAL HIGH (ref 1.7–7.7)
Neutrophils Relative %: 81 %
Platelets: 421 10*3/uL — ABNORMAL HIGH (ref 150–400)
RBC: 3.49 MIL/uL — ABNORMAL LOW (ref 4.22–5.81)
RDW: 13.2 % (ref 11.5–15.5)
WBC: 18.8 10*3/uL — ABNORMAL HIGH (ref 4.0–10.5)
nRBC: 0 % (ref 0.0–0.2)

## 2021-03-14 LAB — COMPREHENSIVE METABOLIC PANEL
ALT: 9 U/L (ref 0–44)
AST: 12 U/L — ABNORMAL LOW (ref 15–41)
Albumin: 1.9 g/dL — ABNORMAL LOW (ref 3.5–5.0)
Alkaline Phosphatase: 72 U/L (ref 38–126)
Anion gap: 5 (ref 5–15)
BUN: 18 mg/dL (ref 8–23)
CO2: 31 mmol/L (ref 22–32)
Calcium: 9.2 mg/dL (ref 8.9–10.3)
Chloride: 102 mmol/L (ref 98–111)
Creatinine, Ser: 0.69 mg/dL (ref 0.61–1.24)
GFR, Estimated: >60 mL/min (ref >60)
Glucose, Bld: 253 mg/dL — ABNORMAL HIGH (ref 70–99)
Potassium: 4.5 mmol/L (ref 3.5–5.1)
Sodium: 138 mmol/L (ref 135–145)
Total Bilirubin: 0.4 mg/dL (ref 0.3–1.2)
Total Protein: 5.9 g/dL — ABNORMAL LOW (ref 6.5–8.1)

## 2021-03-14 LAB — GLUCOSE, CAPILLARY
Glucose-Capillary: 217 mg/dL — ABNORMAL HIGH (ref 70–99)
Glucose-Capillary: 220 mg/dL — ABNORMAL HIGH (ref 70–99)

## 2021-03-14 LAB — PROTIME-INR
INR: 1.1 (ref 0.8–1.2)
Prothrombin Time: 14.3 seconds (ref 11.4–15.2)

## 2021-03-14 LAB — MAGNESIUM: Magnesium: 1.5 mg/dL — ABNORMAL LOW (ref 1.7–2.4)

## 2021-03-14 MED ORDER — MORPHINE SULFATE (PF) 2 MG/ML IV SOLN: 2.0000 mg | INTRAVENOUS | Status: DC | PRN

## 2021-03-14 MED ORDER — CARVEDILOL 6.25 MG PO TABS
6.2500 mg | ORAL_TABLET | Freq: Two times a day (BID) | ORAL | Status: DC
  Administered 2021-03-14 – 2021-03-18 (×9): 6.25 mg via ORAL
  Filled 2021-03-14 (×9): qty 1

## 2021-03-14 MED ORDER — OXYCODONE HCL 5 MG PO TABS
5.0000 mg | ORAL_TABLET | ORAL | Status: DC | PRN
  Administered 2021-03-14 – 2021-03-17 (×6): 5 mg via ORAL
  Filled 2021-03-14 (×6): qty 1

## 2021-03-14 MED ORDER — GABAPENTIN 300 MG PO CAPS
300.0000 mg | ORAL_CAPSULE | Freq: Every day | ORAL | Status: DC
  Administered 2021-03-14 – 2021-03-17 (×4): 300 mg via ORAL
  Filled 2021-03-14 (×4): qty 1

## 2021-03-14 MED ORDER — ONDANSETRON HCL 4 MG/2ML IJ SOLN: 4.0000 mg | Freq: Four times a day (QID) | INTRAMUSCULAR | Status: DC | PRN

## 2021-03-14 MED ORDER — SODIUM CHLORIDE 0.9 % IV SOLN
1.0000 g | INTRAVENOUS | Status: DC
  Administered 2021-03-14: 1 g via INTRAVENOUS
  Filled 2021-03-14: qty 10

## 2021-03-14 MED ORDER — ENOXAPARIN SODIUM 40 MG/0.4ML IJ SOSY
40.0000 mg | PREFILLED_SYRINGE | INTRAMUSCULAR | Status: DC
  Administered 2021-03-14 – 2021-03-17 (×4): 40 mg via SUBCUTANEOUS
  Filled 2021-03-14 (×4): qty 0.4

## 2021-03-14 MED ORDER — ACETAMINOPHEN 325 MG PO TABS
650.0000 mg | ORAL_TABLET | Freq: Four times a day (QID) | ORAL | Status: DC | PRN
  Administered 2021-03-14: 650 mg via ORAL
  Filled 2021-03-14: qty 2

## 2021-03-14 MED ORDER — SODIUM CHLORIDE 0.9 % IV SOLN
500.0000 mg | INTRAVENOUS | Status: DC
  Administered 2021-03-14: 500 mg via INTRAVENOUS
  Filled 2021-03-14: qty 500

## 2021-03-14 MED ORDER — FAMOTIDINE 20 MG PO TABS
20.0000 mg | ORAL_TABLET | Freq: Every evening | ORAL | Status: DC
  Administered 2021-03-14 – 2021-03-17 (×4): 20 mg via ORAL
  Filled 2021-03-14 (×3): qty 1

## 2021-03-14 MED ORDER — CHLORHEXIDINE GLUCONATE CLOTH 2 % EX PADS
6.0000 | MEDICATED_PAD | Freq: Every day | CUTANEOUS | Status: DC
  Administered 2021-03-14 – 2021-03-18 (×3): 6 via TOPICAL

## 2021-03-14 MED ORDER — HEPARIN SODIUM (PORCINE) 5000 UNIT/ML IJ SOLN: 5000.0000 [IU] | Freq: Three times a day (TID) | INTRAMUSCULAR | Status: DC

## 2021-03-14 MED ORDER — ACETAMINOPHEN 650 MG RE SUPP: 650.0000 mg | Freq: Four times a day (QID) | RECTAL | Status: DC | PRN

## 2021-03-14 MED ORDER — BACLOFEN 5 MG HALF TABLET
5.0000 mg | ORAL_TABLET | Freq: Three times a day (TID) | ORAL | Status: DC
  Administered 2021-03-14 – 2021-03-18 (×12): 5 mg via ORAL
  Filled 2021-03-14 (×14): qty 1

## 2021-03-14 MED ORDER — MAGNESIUM SULFATE 2 GM/50ML IV SOLN
2.0000 g | Freq: Once | INTRAVENOUS | Status: AC
  Administered 2021-03-14: 2 g via INTRAVENOUS
  Filled 2021-03-14: qty 50

## 2021-03-14 MED ORDER — INSULIN ASPART 100 UNIT/ML IJ SOLN
0.0000 [IU] | Freq: Three times a day (TID) | INTRAMUSCULAR | Status: DC
Start: 2021-03-14 — End: 2021-03-18
  Administered 2021-03-14 – 2021-03-15 (×2): 3 [IU] via SUBCUTANEOUS
  Administered 2021-03-15: 13:00:00 2 [IU] via SUBCUTANEOUS
  Administered 2021-03-15: 17:00:00 1 [IU] via SUBCUTANEOUS
  Administered 2021-03-16 – 2021-03-18 (×8): 2 [IU] via SUBCUTANEOUS

## 2021-03-14 MED ORDER — SIMVASTATIN 20 MG PO TABS
20.0000 mg | ORAL_TABLET | Freq: Every day | ORAL | Status: DC
  Administered 2021-03-14 – 2021-03-17 (×4): 20 mg via ORAL
  Filled 2021-03-14 (×3): qty 1

## 2021-03-14 MED ORDER — ONDANSETRON HCL 4 MG PO TABS: 4.0000 mg | ORAL_TABLET | Freq: Four times a day (QID) | ORAL | Status: DC | PRN

## 2021-03-14 MED ORDER — ASPIRIN 81 MG PO CHEW
81.0000 mg | CHEWABLE_TABLET | Freq: Every day | ORAL | Status: DC
  Administered 2021-03-14 – 2021-03-18 (×5): 81 mg via ORAL
  Filled 2021-03-14 (×5): qty 1

## 2021-03-14 NOTE — Progress Notes (Signed)
Received patient to room from [facility]. Patient  in bed, no pain complaint vitals noted, admissions notified of patient's arrival. Awaiting orders.

## 2021-03-14 NOTE — Progress Notes (Addendum)
PROGRESS NOTE    [name]  GLO:[id number] DOB: August 01, 1945 DOA: 03/14/2021 PCP: Rigoberto Noel, MD    Brief Narrative:  Mr. Serpe was transferred from [facility] for evaluation for left sided complicated para-pneumonic effusion. Question empyema   75 yo male with the past medical history of paraplegia-bedbound, T2DM, CAD, and non ischemic cardiomyopathy,  Patient admitted to [facility] on 03/07/21 due to acute respiratory failure due to left pleural effusion/ empyema, and left lung pneumonia.  Patient underwent thoracentesis/ chest tube on the left side and received IV antibiotic therapy. Completed 5 days of ceftriaxone and azithromycin.  Required supplemental 02 per [place].  Left pleural effusion with loculation and requiring lysis.  At the time of his transfer his blood pressure was 123/77, HR 67, Temp 98.1, and RR 18 with oxygen saturation 91% Lungs with decreased breath sounds on the left, heart S1 and S2 present and rhythmic, abdomen soft and non tender, no lower extremity edema.   Na 138, K 4,5 Cl 105, bicarbonate 31, glucose 253, BUN 18 and cr 0,69 Mg 1,5 Wbc 18, Hgb 10,8 Hct 33,2 and Plt 421.   Chest film from 03/12/21 positive cardiomegaly with small left pleural effusion, chest tube in place.   EKG 87 bpm, left axis deviation, right bundle branch block, sinus rhythm, no poor R-R wave progression, no significant ST segment or T wave changes.   Assessment & Plan:   Principal Problem:   Empyema (HCC) Active Problems:   S/P cervical spinal fusion   Essential hypertension   HLD (hyperlipidemia)   Chronic diastolic CHF (congestive heart failure) (HCC)   Type 2 diabetes mellitus with hyperlipidemia (HCC)   Obesity (BMI 30.0-34.9)   Acute hypoxemic respiratory failure due to community acquired pneumonia complicated with complicated para-pneumonic effusion/ question empyema. Fluid analysis with 1,857 wbc, (95% neutrophils), glucose less than 20, protein 34, no pH or LDH  tested. Protein 4,2. Clear liquid. Culture with no growth.  Clinical evidence of loculations.  No documentation of chest tube output  Chest tube in place with no air leak, at this point connected to suction 20 cm H20   Patient was transferred to [facility] for further CT surgery evaluation, possible lysis. Her wife is at the bedside, would like to avoid aggressive surgical intervention.  Pain control with morphine and oxycodone.   Because cultures are no growth, patient is not febrile and there is no leukocytosis will hold on further antibiotic therapy for now.   2. Urinary retention. Continue with foley catheter, urine infection has been ruled out.   3. Non ischemic cardiomyopathy. No signs of acute decompensated heart failure. Continue with carvedilol.   4. T2DM/ dyslipidemia continue glucose cover and monitoring with insulin sliding scale, Diabetic diet, add insulin siding scale for glucose cover and monitoring.  Continue with statin therapy.  5. Hyponatremia and  hypomagnesemia.  Add 2 g Mag sulfate today, follow up electrolytes in am, hold on IV fluids.   6. Paraplegia and neuropathy. Obesity class 1 Resume baclofen and gabapentin     Patient continue to be at high risk for  worsening left pleural effusion   Status is: Inpatient  Remains inpatient appropriate because: chest tube    DVT prophylaxis:  Enoxaparin   Code Status:    DNR  Family Communication:   I spoke with patient's  wife  at the bedside, we talked in detail about patient's condition, plan of care and prognosis and all questions were addressed.    Consultants:  CT surgery     Subjective: Patient with no nausea or vomiting, dyspnea has been stable, continue to have left sided chest tube.   Objective: Vitals:   03/14/21 0051 03/14/21 0728 03/14/21 1235  BP: 123/77 (!) 117/57 111/65  Pulse: 67 76 65  Resp: 18 (!) 27 (!) 26  Temp: 98.1 F (36.7 C) 98.1 F (36.7 C) 97.6 F (36.4 C)  TempSrc: Oral Oral  Oral  SpO2: 91%  94%   No intake or output data in the 24 hours ending 03/14/21 1340 There were no vitals filed for this visit.  Examination:   General: Not in pain or dyspnea, deconditioned  Neurology: Awake and alert, non focal  E ENT: no pallor, no icterus, oral mucosa moist Cardiovascular: No JVD. S1-S2 present, rhythmic, no gallops, rubs, or murmurs. No lower extremity edema. Pulmonary: positive breath sounds bilaterally, with no wheezing, rhonchi or rales. Decreased breath sounds on the left, anterior auscultation.  Gastrointestinal. Abdomen soft and non tender Skin. No rashes Musculoskeletal: no joint deformities     Data Reviewed: I have personally reviewed following labs and imaging studies  CBC: Recent Labs  Lab 03/10/21 0208 03/11/21 0629 03/12/21 0318 03/13/21 0256 03/14/21 0606  WBC 21.2* 13.8* 14.2* 14.8* 18.8*  NEUTROABS  --   --   --   --  15.1*  HGB 10.1* 10.6* 10.4* 10.3* 10.8*  HCT 31.3* 31.4* 30.9* 31.4* 33.2*  MCV 92.1 92.1 92.2 94.0 95.1  PLT 348 399 429* 426* 421*   Basic Metabolic Panel: Recent Labs  Lab 03/10/21 0208 03/11/21 0629 03/12/21 0318 03/13/21 0256 03/14/21 0606  NA 127* 130* 133* 134* 138  K 4.2 4.0 3.9 4.1 4.5  CL 94* 96* 98 100 102  CO2 27 29 30 29 31   GLUCOSE 141* 166* 194* 194* 253*  BUN 52* 43* 38* 27* 18  CREATININE 0.84 0.67 0.57* 0.49* 0.69  CALCIUM 8.9 9.1 9.0 9.2 9.2  MG 1.8 1.7 1.8 1.5* 1.5*   GFR: Estimated Creatinine Clearance: 99.4 mL/min (by C-G formula based on SCr of 0.69 mg/dL). Liver Function Tests: Recent Labs  Lab 03/14/21 0606  AST 12*  ALT 9  ALKPHOS 72  BILITOT 0.4  PROT 5.9*  ALBUMIN 1.9*   No results for input(s): LIPASE, AMYLASE in the last 168 hours. No results for input(s): AMMONIA in the last 168 hours. Coagulation Profile: Recent Labs  Lab 03/14/21 0606  INR 1.1   Cardiac Enzymes: No results for input(s): CKTOTAL, CKMB, CKMBINDEX, TROPONINI in the last 168 hours. BNP (last  3 results) No results for input(s): PROBNP in the last 8760 hours. HbA1C: No results for input(s): HGBA1C in the last 72 hours. CBG: Recent Labs  Lab 03/08/21 1649 03/08/21 2239 03/09/21 0803 03/09/21 1110 03/09/21 1637  GLUCAP 102* 112* 132* 101* 97   Lipid Profile: No results for input(s): CHOL, HDL, LDLCALC, TRIG, CHOLHDL, LDLDIRECT in the last 72 hours. Thyroid Function Tests: No results for input(s): TSH, T4TOTAL, FREET4, T3FREE, THYROIDAB in the last 72 hours. Anemia Panel: No results for input(s): VITAMINB12, FOLATE, FERRITIN, TIBC, IRON, RETICCTPCT in the last 72 hours.    Radiology Studies: I have reviewed all of the imaging during this hospital visit personally     Scheduled Meds:  aspirin  81 mg Oral Daily   carvedilol  6.25 mg Oral BID WC   famotidine  20 mg Oral QPM   gabapentin  300 mg Oral QHS   heparin  5,000 Units Subcutaneous Q8H  simvastatin  20 mg Oral q1800   Continuous Infusions:  azithromycin 500 mg (03/14/21 0904)   cefTRIAXone (ROCEPHIN)  IV 1 g (03/14/21 1027)   magnesium sulfate bolus IVPB 2 g (03/14/21 1311)     LOS: 0 days        Gertha Lichtenberg Annett Gula, MD

## 2021-03-14 NOTE — H&P (Signed)
TRH H&P    Patient Demographics:    Roy [name], is a 75 y.o. male  MRN: [id number]  DOB - 1946-01-16  Admit Date - 03/14/2021  Referring MD/NP/PA: [facility]  Outpatient Primary MD for the patient is Rigoberto Noel, MD  Patient coming from: [facility]  Chief complaint- Dyspnea   HPI:    Roy [name]  is a 75 y.o. male, with medical history significant for paraplegia-bedbound, angioedema from ACE inhibitor status post intubation 1 month prior to admission, pneumonia about 6 weeks ago, diabetes mellitus, coronary artery disease, nonischemic cardiomyopathy who presented to the emergency department with hypotension and syncope as well as shortness of breath and weakness.  Patient was admitted on 26 November.  He had been hypotensive at home with reported blood pressures in the 60s over 40s.  He also had reported oxygen saturations as low as the high 70s.  CT scan was done in the ER and it showed a white out of the left lung with pleural effusion, and his platelet cell count was noted to be quite high at 32, and hyponatremia as well at 125.  He had noted a T-max at home of 103.  Patient was admitted to the hospital for recurrent left pleural effusion.  IR was contacted for thoracentesis and tube placement.  Patient had Rocephin and Zithromax and gentle hydration.  The chest tube had a large amount of output, pulmonary was consulted and recommended thoracic surgery consult for thrombolytics.  This reason patient was transferred to [facility].  While at the other hospital he did complete 5 days of ceftriaxone and Zithromax.  Patient was initially requiring 2 to 3 L nasal cannula to maintain oxygen sats and now is only requiring 1 to 2 L.  Upon arrival to discharge patient reports he is not currently in any pain.  He reports that he is breathing comfortably and does not feel short of breath on 2 L nasal cannula.  He is hungry  and is disappointed to hear that he is not going to be able to eat until thoracic surgery sees him.  He is missing his wife who plans to be here during visiting hours.  Patient does complain of dry cough which is not progressively worsening.  Patient has no other complaints.  Patient reports that he does not smoke, does not drink alcohol, is vaccinated for COVID.  Patient is DNR.    Review of systems:    In addition to the HPI above,  Fever and chills at the beginning of admission No Headache, No changes with Vision or hearing, No problems swallowing food or Liquids, No Chest pain, cough and dyspnea at beginning of admission No Abdominal pain, No Nausea or Vomiting, bowel movements are regular, No Blood in stool or Urine, No dysuria, No new skin rashes or bruises, No new joints pains-aches,  No new weakness, tingling, numbness in any extremity, No recent weight gain or loss, No polyuria, polydypsia or polyphagia,   All other systems reviewed and are negative.    Past History of the  following :    Past Medical History:  Diagnosis Date   Bedbound    Bilateral shoulder pain    CHF (congestive heart failure) (HCC)    Coronary artery disease    Diabetes mellitus without complication (HCC)    GERD (gastroesophageal reflux disease)    Glaucoma    HLD (hyperlipidemia)    Hypertension    Lumbar stenosis    Non-ischemic cardiomyopathy (HCC)    PVC (premature ventricular contraction)    Sleep apnea    H/O NO CPAP IN 18 YRS      Past Surgical History:  Procedure Laterality Date   ANTERIOR CERVICAL DECOMP/DISCECTOMY FUSION N/A 01/29/2020   Procedure: ANTERIOR CERVICAL DECOMPRESSION/DISCECTOMY FUSION 1 LEVEL C3/4;  Surgeon: [name], MD;  Location: [facility] ORS;  Service: Neurosurgery;  Laterality: N/A;   arm surgery Right    4x surgery as a child, cut arm falling through glass window    HYDROCELE EXCISION / REPAIR     POSTERIOR CERVICAL FUSION/FORAMINOTOMY N/A 05/06/2020    Procedure: C3-4 LAMINECTOMY & C3-4 FUSION;  Surgeon: [name], MD;  Location: [facility] ORS;  Service: Neurosurgery;  Laterality: N/A;   REPLACEMENT TOTAL KNEE Right       Social History:      Social History   Tobacco Use   Smoking status: Never   Smokeless tobacco: Never  Substance Use Topics   Alcohol use: No       Family History :    No family history on file. Family history of hypertension   Home Medications:   Prior to Admission medications   Medication Sig Start Date End Date Taking? Authorizing Provider  acetaminophen (TYLENOL) 500 MG tablet Take 2 tablets (1,000 mg total) by mouth every 6 (six) hours as needed. 03/11/21   Darlin Priestly, MD  amLODipine (NORVASC) 10 MG tablet Hold due to soft blood pressure. 03/11/21   Darlin Priestly, MD  aspirin 81 MG chewable tablet Chew 81 mg by mouth daily.    [provider]  baclofen (LIORESAL) 10 MG tablet Take 5 mg by mouth 3 (three) times daily.    [provider]  brimonidine (ALPHAGAN) 0.2 % ophthalmic solution Place 1 drop into the right eye in the morning and at bedtime. 11/21/20   [provider]  calcium carbonate (TUMS - DOSED IN MG ELEMENTAL CALCIUM) 500 MG chewable tablet Chew 1 tablet by mouth 2 (two) times daily as needed for indigestion or heartburn.    [provider]  carvedilol (COREG) 6.25 MG tablet Take 6.25 mg by mouth 2 (two) times daily. 12/31/20   [provider]  cyanocobalamin 2000 MCG tablet Take 2,000 mcg by mouth daily. 01/24/19   [provider]  famotidine (PEPCID) 20 MG tablet Take 20 mg by mouth every evening.    [provider]  gabapentin (NEURONTIN) 300 MG capsule Take 300 mg by mouth at bedtime.    [provider]  insulin aspart (NOVOLOG) 100 UNIT/ML injection Hold since blood glucose within goal without any insulin. 03/11/21   Darlin Priestly, MD  insulin detemir (LEVEMIR FLEXTOUCH) 100 UNIT/ML FlexPen Hold since blood glucose within goal  without any insulin. 03/11/21   Darlin Priestly, MD  latanoprost (XALATAN) 0.005 % ophthalmic solution Place 1 drop into the right eye at bedtime. 08/20/19   [provider]  meloxicam (MOBIC) 7.5 MG tablet Take 1 tablet (7.5 mg total) by mouth daily as needed for pain. 03/11/21   Darlin Priestly, MD  metFORMIN Welford Roche)  500 MG 24 hr tablet Take 500 mg by mouth 2 (two) times daily. 01/19/21   [provider]  Multiple Vitamins-Minerals (CENTRUM SILVER 50+MEN PO) Take 1 tablet by mouth daily.    [provider]  polyethylene glycol powder (GLYCOLAX/MIRALAX) 17 GM/SCOOP powder Take 17 g by mouth daily as needed for moderate constipation. 03/11/21   Darlin Priestly, MD  simvastatin (ZOCOR) 20 MG tablet Take 20 mg by mouth daily at 6 PM.    [provider]  tamsulosin (FLOMAX) 0.4 MG CAPS capsule Take 1 capsule (0.4 mg total) by mouth daily after supper. 01/25/20   Wouk, Wilfred Curtis, MD     Allergies:     Allergies  Allergen Reactions   Ace Inhibitors Other (See Comments)    Angioedema   Lisinopril Swelling    Angioedema     Physical Exam:   Vitals  Blood pressure 123/77, pulse 67, temperature 98.1 F (36.7 C), temperature source Oral, resp. rate 18, SpO2 91 %.   1.  General: Patient lying supine in bed,  no acute distress   2. Psychiatric: Alert and oriented to person place in the context, mood and behavior normal for situation, pleasant and cooperative with exam   3. Neurologic: Speech is slightly delayed but seems to be patient's baseline and language are normal, face is symmetric, moves both upper extremities, but unable to move lower extremities, at baseline without acute deficits on limited exam   4. HEENMT:  Head is atraumatic, normocephalic, pupils reactive to light, neck is supple, trachea is midline, mucous membranes are moist   5. Respiratory : Lungs are clear to auscultation bilaterally without wheezing, rhonchi, rales, no cyanosis, no  increase in work of breathing or accessory muscle use, chest tube in place   6. Cardiovascular : Heart rate normal, rhythm is regular, no murmurs, rubs or gallops, no peripheral edema, peripheral pulses palpated   7. Gastrointestinal:  Abdomen is soft, nondistended, nontender to palpation bowel sounds active, no masses or organomegaly palpated   8. Skin:  Skin is warm, dry and intact without rashes, acute lesions, or ulcers on limited exam   9.Musculoskeletal:  No acute deformities or trauma, no asymmetry in tone, no peripheral edema, peripheral pulses palpated, no tenderness to palpation in the extremities     Data Review:    CBC Recent Labs  Lab 03/09/21 0441 03/10/21 0208 03/11/21 0629 03/12/21 0318 03/13/21 0256  WBC 28.2* 21.2* 13.8* 14.2* 14.8*  HGB 10.3* 10.1* 10.6* 10.4* 10.3*  HCT 31.0* 31.3* 31.4* 30.9* 31.4*  PLT 362 348 399 429* 426*  MCV 91.2 92.1 92.1 92.2 94.0  MCH 30.3 29.7 31.1 31.0 30.8  MCHC 33.2 32.3 33.8 33.7 32.8  RDW 12.8 12.6 12.6 12.8 13.0   ------------------------------------------------------------------------------------------------------------------  Results for orders placed or performed during the hospital encounter of 03/07/21 (from the past 48 hour(s))  Basic metabolic panel     Status: Abnormal   Collection Time: 03/12/21  3:18 AM  Result Value Ref Range   Sodium 133 (L) 135 - 145 mmol/L   Potassium 3.9 3.5 - 5.1 mmol/L   Chloride 98 98 - 111 mmol/L   CO2 30 22 - 32 mmol/L   Glucose, Bld 194 (H) 70 - 99 mg/dL    Comment: Glucose reference range applies only to samples taken after fasting for at least 8 hours.   BUN 38 (H) 8 - 23 mg/dL   Creatinine, Ser 4.43 (L) 0.61 - 1.24 mg/dL   Calcium 9.0  8.9 - 10.3 mg/dL   GFR, Estimated >38 >10 mL/min    Comment: (NOTE) Calculated using the CKD-EPI Creatinine Equation (2021)    Anion gap 5 5 - 15    Comment: Performed at [facility], [address] [place], [place] [address]   CBC     Status: Abnormal   Collection Time: 03/12/21  3:18 AM  Result Value Ref Range   WBC 14.2 (H) 4.0 - 10.5 K/uL   RBC 3.35 (L) 4.22 - 5.81 MIL/uL   Hemoglobin 10.4 (L) 13.0 - 17.0 g/dL   HCT 25.8 (L) 52.7 - 78.2 %   MCV 92.2 80.0 - 100.0 fL   MCH 31.0 26.0 - 34.0 pg   MCHC 33.7 30.0 - 36.0 g/dL   RDW 42.3 53.6 - 14.4 %   Platelets 429 (H) 150 - 400 K/uL   nRBC 0.0 0.0 - 0.2 %    Comment: Performed at [facility], [address], [place], [place] [address]  Magnesium     Status: None   Collection Time: 03/12/21  3:18 AM  Result Value Ref Range   Magnesium 1.8 1.7 - 2.4 mg/dL    Comment: Performed at [facility], [address] [place], [place] [address]  Basic metabolic panel     Status: Abnormal   Collection Time: 03/13/21  2:56 AM  Result Value Ref Range   Sodium 134 (L) 135 - 145 mmol/L   Potassium 4.1 3.5 - 5.1 mmol/L   Chloride 100 98 - 111 mmol/L   CO2 29 22 - 32 mmol/L   Glucose, Bld 194 (H) 70 - 99 mg/dL    Comment: Glucose reference range applies only to samples taken after fasting for at least 8 hours.   BUN 27 (H) 8 - 23 mg/dL   Creatinine, Ser 1.95 (L) 0.61 - 1.24 mg/dL   Calcium 9.2 8.9 - 09.3 mg/dL   GFR, Estimated >26 >71 mL/min    Comment: (NOTE) Calculated using the CKD-EPI Creatinine Equation (2021)    Anion gap 5 5 - 15    Comment: Performed at [facility], [address] [place], [place] [address]  CBC     Status: Abnormal   Collection Time: 03/13/21  2:56 AM  Result Value Ref Range   WBC 14.8 (H) 4.0 - 10.5 K/uL   RBC 3.34 (L) 4.22 - 5.81 MIL/uL   Hemoglobin 10.3 (L) 13.0 - 17.0 g/dL   HCT 99.8 (L) 33.8 - 25.0 %   MCV 94.0 80.0 - 100.0 fL   MCH 30.8 26.0 - 34.0 pg   MCHC 32.8 30.0 - 36.0 g/dL   RDW 53.9 76.7 - 34.1 %   Platelets 426 (H) 150 - 400 K/uL   nRBC 0.0 0.0 - 0.2 %    Comment: Performed at [facility], [address] [place], [place] [address]  Magnesium     Status: Abnormal    Collection Time: 03/13/21  2:56 AM  Result Value Ref Range   Magnesium 1.5 (L) 1.7 - 2.4 mg/dL    Comment: Performed at [facility], [address] [place], [place] [address]    Chemistries  Recent Labs  Lab 03/09/21 0441 03/10/21 [phone] 03/11/21 0629 03/12/21 0318 03/13/21 0256  NA 129* 127* 130* 133* 134*  K 4.6 4.2 4.0 3.9 4.1  CL 94* 94* 96* 98 100  CO2 27 27 29 30 29   GLUCOSE 121* 141* 166* 194* 194*  BUN 47* 52* 43*  38* 27*  CREATININE 1.01 0.84 0.67 0.57* 0.49*  CALCIUM 9.1 8.9 9.1 9.0 9.2  MG 1.7 1.8 1.7 1.8 1.5*   ------------------------------------------------------------------------------------------------------------------  ------------------------------------------------------------------------------------------------------------------ GFR: Estimated Creatinine Clearance: 99.4 mL/min (A) (by C-G formula based on SCr of 0.49 mg/dL (L)). Liver Function Tests: No results for input(s): AST, ALT, ALKPHOS, BILITOT, PROT, ALBUMIN in the last 168 hours. No results for input(s): LIPASE, AMYLASE in the last 168 hours. No results for input(s): AMMONIA in the last 168 hours. Coagulation Profile: No results for input(s): INR, PROTIME in the last 168 hours. Cardiac Enzymes: No results for input(s): CKTOTAL, CKMB, CKMBINDEX, TROPONINI in the last 168 hours. BNP (last 3 results) No results for input(s): PROBNP in the last 8760 hours. HbA1C: Recent Labs    03/11/21 0629  HGBA1C 6.0*   CBG: Recent Labs  Lab 03/08/21 1649 03/08/21 2239 03/09/21 0803 03/09/21 1110 03/09/21 1637  GLUCAP 102* 112* 132* 101* 97   Lipid Profile: No results for input(s): CHOL, HDL, LDLCALC, TRIG, CHOLHDL, LDLDIRECT in the last 72 hours. Thyroid Function Tests: No results for input(s): TSH, T4TOTAL, FREET4, T3FREE, THYROIDAB in the last 72 hours. Anemia Panel: No results for input(s): VITAMINB12, FOLATE, FERRITIN, TIBC, IRON, RETICCTPCT in the last 72  hours.  --------------------------------------------------------------------------------------------------------------- Urine analysis:    Component Value Date/Time   COLORURINE AMBER (A) 03/07/2021 1231   APPEARANCEUR CLEAR 03/07/2021 1231   LABSPEC >1.030 (H) 03/07/2021 1231   PHURINE 5.5 03/07/2021 1231   GLUCOSEU NEGATIVE 03/07/2021 1231   HGBUR NEGATIVE 03/07/2021 1231   BILIRUBINUR SMALL (A) 03/07/2021 1231   KETONESUR NEGATIVE 03/07/2021 1231   PROTEINUR 100 (A) 03/07/2021 1231   NITRITE POSITIVE (A) 03/07/2021 1231   LEUKOCYTESUR SMALL (A) 03/07/2021 1231      Imaging Results:    DG Chest 2 View  Result Date: 03/12/2021 CLINICAL DATA:  Empyema EXAM: CHEST - 2 VIEW COMPARISON:  Chest radiograph 03/07/2021 FINDINGS: There is a new pigtail catheter in place projecting over the left base. The heart is markedly enlarged, unchanged. The left pleural effusion has decreased in size following chest tube placement, and there is improved aeration of the left upper lobe. The right lung is clear. There is no right effusion. There is no pneumothorax. There is no acute osseous abnormality. IMPRESSION: Decreased size of the left pleural effusion following pigtail catheter placement. Electronically Signed   By: Lesia Hausen M.D.   On: 03/12/2021 10:36       Assessment & Plan:    Principal Problem:   Empyema (HCC)  Large left pleural effusion with Empyema S/p chest tube placement on 11/28 Subtotal atelectasis of LEFT lung --large amount of output from chest tube, including purulent material --pulm consulted, Dr. Karna Christmas  -Glucose <10 additional studies in process Plan: --cont chest tube per pulm management --per pulm, need further evaluation with thoracic surgery for tPA/dornase infusion BID x3 days. --Arrived now to [facility], consults placed -will need to be called on day team   Likely left PNA --WBC initially 32.9, procal 0.61.   --Red blood cell count improved to  14.8 --Continue Rocephin and Zithromax  Acute hypoxemic respiratory failure --tele in the ED showed O2 sats in mid 80's, pt needed 2-3L [place]. --Patient currently maintaining oxygen sats on 1 to 2 L nasal cannula --Continue supplemental O2 to keep sats >=90%, wean as tolerated  Urinary tract infection, ruled out --denied dysuria or suprapubic tenderness.  UA obtained from the Foley bag.   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 Patient is completely bedbound since October 2021 --partial air mattress  Nonischemic cardiomyopathy.   --per wife, EF used to be 17%, but has improved to 60-65% a year ago.  Not on diuretic at home. --cont home coreg  Hyponatremia --Na of 125 on presentation.  Improved to sodium 134 --Continue to monitor   DM2 --recent A1c 6.1, too low for his age --BG so far have been wnl --d/c'ed fingersticks and insulin   DVT prophylaxis: Heparin SQ Code Status: DNR  Family Communication: Wife plans to be at bedside during visiting hours dispo:   The patient is from: home Anticipated d/c date is: 48-72 hours  DVT Prophylaxis-   Heparin - SCDs   AM Labs Ordered, also please review Full Orders   Code Status: DNR  Admission status: Inpatient :The appropriate admission status for this patient is INPATIENT. Inpatient status is judged to be reasonable and necessary in order to provide the required intensity of service to ensure the patient's safety. The patient's presenting symptoms, physical exam findings, and initial radiographic and laboratory data in the context of their chronic comorbidities is felt to place them at high risk for further clinical deterioration. Furthermore, it is not anticipated that the patient will be medically stable for discharge from the hospital within 2 midnights of admission. The following factors support the  admission status of inpatient.     The patient's presenting symptoms include cough and dyspnea. The worrisome physical exam findings include high output from chest tube. The initial radiographic and laboratory data are worrisome because of right out of left lung. The chronic co-morbidities include paraplegia, coronary artery disease, CHF, diabetes mellitus type 2, GERD.       * I certify that at the point of admission it is my clinical judgment that the patient will require inpatient hospital care spanning beyond 2 midnights from the point of admission due to high intensity of service, high risk for further deterioration and high frequency of surveillance required.*   Time spent in minutes : 65   Yury Schaus B Zierle-Ghosh DO

## 2021-03-15 ENCOUNTER — Inpatient Hospital Stay (HOSPITAL_COMMUNITY): Payer: Medicare HMO

## 2021-03-15 DIAGNOSIS — E785 Hyperlipidemia, unspecified: Secondary | ICD-10-CM | POA: Diagnosis not present

## 2021-03-15 DIAGNOSIS — E669 Obesity, unspecified: Secondary | ICD-10-CM | POA: Diagnosis not present

## 2021-03-15 DIAGNOSIS — J869 Pyothorax without fistula: Secondary | ICD-10-CM | POA: Diagnosis not present

## 2021-03-15 DIAGNOSIS — I10 Essential (primary) hypertension: Secondary | ICD-10-CM | POA: Diagnosis not present

## 2021-03-15 LAB — BASIC METABOLIC PANEL
Anion gap: 4 — ABNORMAL LOW (ref 5–15)
BUN: 15 mg/dL (ref 8–23)
CO2: 33 mmol/L — ABNORMAL HIGH (ref 22–32)
Calcium: 9 mg/dL (ref 8.9–10.3)
Chloride: 100 mmol/L (ref 98–111)
Creatinine, Ser: 0.56 mg/dL — ABNORMAL LOW (ref 0.61–1.24)
GFR, Estimated: >60 mL/min (ref >60)
Glucose, Bld: 187 mg/dL — ABNORMAL HIGH (ref 70–99)
Potassium: 4.4 mmol/L (ref 3.5–5.1)
Sodium: 137 mmol/L (ref 135–145)

## 2021-03-15 LAB — GLUCOSE, CAPILLARY
Glucose-Capillary: 144 mg/dL — ABNORMAL HIGH (ref 70–99)
Glucose-Capillary: 167 mg/dL — ABNORMAL HIGH (ref 70–99)
Glucose-Capillary: 182 mg/dL — ABNORMAL HIGH (ref 70–99)
Glucose-Capillary: 183 mg/dL — ABNORMAL HIGH (ref 70–99)
Glucose-Capillary: 189 mg/dL — ABNORMAL HIGH (ref 70–99)
Glucose-Capillary: 194 mg/dL — ABNORMAL HIGH (ref 70–99)

## 2021-03-15 LAB — CBC
HCT: 31.7 % — ABNORMAL LOW (ref 39.0–52.0)
Hemoglobin: 9.9 g/dL — ABNORMAL LOW (ref 13.0–17.0)
MCH: 30 pg (ref 26.0–34.0)
MCHC: 31.2 g/dL (ref 30.0–36.0)
MCV: 96.1 fL (ref 80.0–100.0)
Platelets: 392 10*3/uL (ref 150–400)
RBC: 3.3 MIL/uL — ABNORMAL LOW (ref 4.22–5.81)
RDW: 13.2 % (ref 11.5–15.5)
WBC: 15.7 10*3/uL — ABNORMAL HIGH (ref 4.0–10.5)
nRBC: 0 % (ref 0.0–0.2)

## 2021-03-15 LAB — MAGNESIUM: Magnesium: 1.7 mg/dL (ref 1.7–2.4)

## 2021-03-15 IMAGING — DX DG CHEST 1V PORT
1 series · 1 of 1 positions shown · non-contrast
Comparison: [DATE]

CLINICAL DATA: Chest tube in place

EXAM:
PORTABLE CHEST 1 VIEW

[chest]
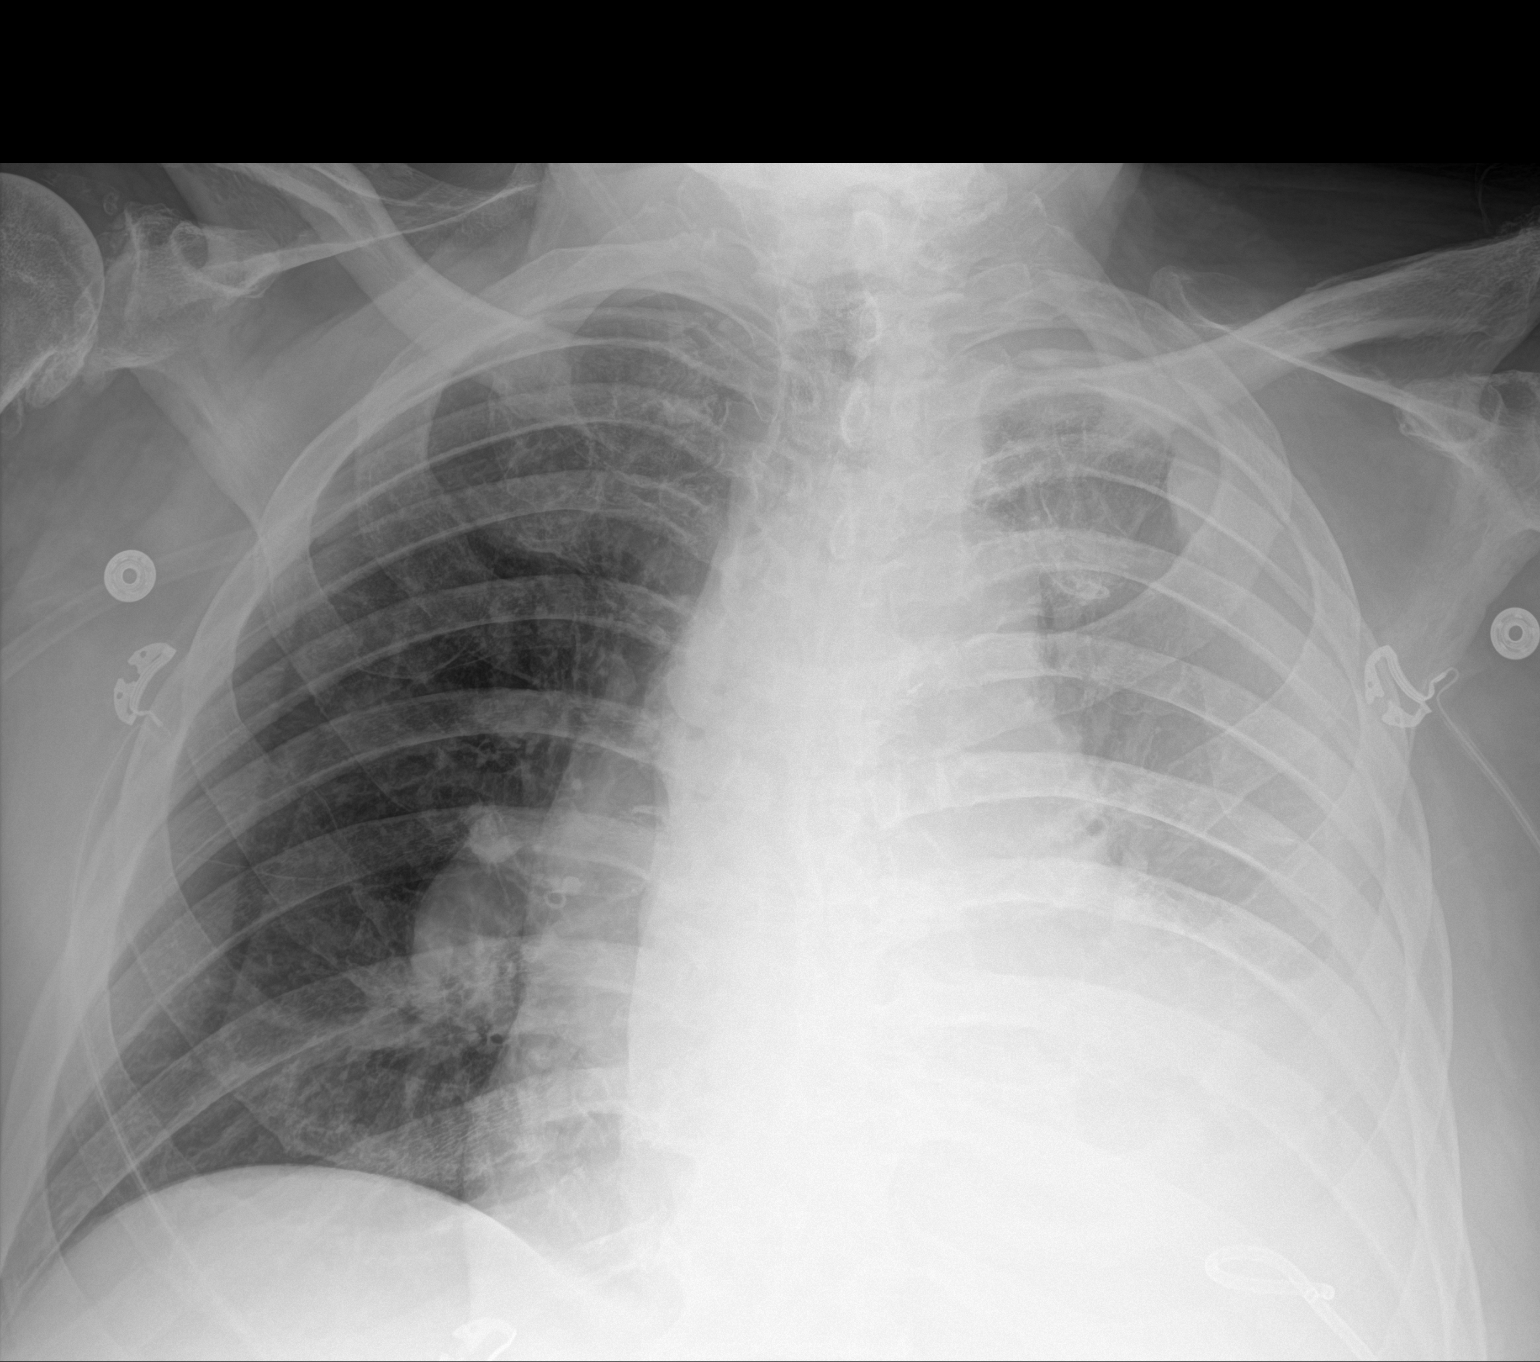

[1 of 1 positions shown; findings below may reference images not displayed]

FINDINGS: Increased opacification of the left hemithorax likely secondary to
larger effusion and greater atelectasis. Right lung remains clear.
No pneumothorax. Cardiomegaly. Chest tube overlies the left lung
base.
IMPRESSION: Increased left pleural effusion and left lung atelectasis. Left
basilar chest tube present.

## 2021-03-15 MED ORDER — STERILE WATER FOR INJECTION IJ SOLN
5.0000 mg | Freq: Once | RESPIRATORY_TRACT | Status: AC
  Administered 2021-03-15: 5 mg via INTRAPLEURAL
  Filled 2021-03-15: qty 5

## 2021-03-15 MED ORDER — AMOXICILLIN-POT CLAVULANATE 875-125 MG PO TABS
1.0000 | ORAL_TABLET | Freq: Two times a day (BID) | ORAL | Status: DC
  Administered 2021-03-15 – 2021-03-18 (×7): 1 via ORAL
  Filled 2021-03-15 (×7): qty 1

## 2021-03-15 MED ORDER — SODIUM CHLORIDE 0.9% FLUSH
10.0000 mL | Freq: Three times a day (TID) | INTRAVENOUS | Status: DC
  Administered 2021-03-15 – 2021-03-17 (×7): 10 mL

## 2021-03-15 MED ORDER — SODIUM CHLORIDE (PF) 0.9 % IJ SOLN
10.0000 mg | Freq: Once | INTRAMUSCULAR | Status: AC
  Administered 2021-03-15: 10 mg via INTRAPLEURAL
  Filled 2021-03-15: qty 10

## 2021-03-15 MED ORDER — STERILE WATER FOR INJECTION IJ SOLN
5.0000 mg | Freq: Once | RESPIRATORY_TRACT | Status: AC
  Administered 2021-03-15: 11:00:00 5 mg via INTRAPLEURAL
  Filled 2021-03-15: qty 5

## 2021-03-15 MED ORDER — MAGNESIUM SULFATE 2 GM/50ML IV SOLN
2.0000 g | Freq: Once | INTRAVENOUS | Status: AC
  Administered 2021-03-15: 13:00:00 2 g via INTRAVENOUS
  Filled 2021-03-15: qty 50

## 2021-03-15 NOTE — Procedures (Signed)
Pleural Fibrinolytic Administration Procedure Note  Roy [name]  [id number]  1945/09/30  Date:03/15/21  Time:11:39 AM   Provider Performing:Keian Odriscoll D. Harris   Procedure: Pleural Fibrinolysis Initial day ([address])  Indication(s) Fibrinolysis of complicated pleural effusion  Consent Risks of the procedure as well as the alternatives and risks of each were explained to the patient and/or caregiver.  Consent for the procedure was obtained.   Anesthesia None   Time Out Verified patient identification, verified procedure, site/side was marked, verified correct patient position, special equipment/implants available, medications/allergies/relevant history reviewed, required imaging and test results available.   Sterile Technique Hand hygiene, gloves   Procedure Description Existing pleural catheter was cleaned and accessed in sterile manner.  10mg  of tPA in 30cc of saline and 5mg  of dornase in 30cc of sterile water were injected into pleural space using existing pleural catheter.  Catheter will be clamped for 1 hour and then placed back to suction.   Complications/Tolerance None; patient tolerated the procedure well.  EBL None   Specimen(s) None  Roy [name] D. , NP-[facility] Personal contact information can be found on Amion  03/15/2021, 11:39 AM

## 2021-03-15 NOTE — Consult Note (Signed)
NAME:  Roy [name]., MRN:  [id number], DOB:  26-Jun-1945, LOS: 1 ADMISSION DATE:  03/14/2021, CONSULTATION DATE:  03/15/2021 REFERRING MD:  Dr. Ella Jubilee, CHIEF COMPLAINT:  Loculated pleural effusion    History of Present Illness:  Roy [name]. Is a 75 y.o. male with a PMH significant for paraplegia-bedbound, type 2 diabetes, CAD, non-ischemic cardiomyopathy, GERD, HLD, and HTN, who presented to [facility] 11/26 due to acute respiratory failure and was found to have a left loculated pleural effusion on imagining. IR was then consulted and left pigtail chest tube was placed 11/28. Per chart he also received 5 days of IV Ceftriaxone and Azithromycin.   Decision was made to transfer patient to [facility] for further management and possible need for pleural lytics. [facility]CTS was initially consulted but [facility] was ultimately consulted for chest tube management.   Pertinent  Medical History  Paraplegia-bedbound, type 2 diabetes, CAD, non-ischemic cardiomyopathy, GERD, HLD, and HTN,  Significant Hospital Events: Including procedures, antibiotic start and stop dates in addition to other pertinent events   11/26 admitted to [facility] for acute respiratory failure seen with likely loculated pleural effusion  11/28 pigtail chest tube placed  12/3 transferred to [facility]  12/4 Spouse states that chest drainage system has been exchanged once therefore total out is now near  Interim History / Subjective:  Seen lying in bed with no acute complaints   Objective   Blood pressure 133/76, pulse 76, temperature (!) 97.3 F (36.3 C), temperature source Oral, resp. rate 20, SpO2 94 %.        Intake/Output Summary (Last 24 hours) at 03/15/2021 0948 Last data filed at 03/15/2021 0641 Gross per 24 hour  Intake 163.82 ml  Output 2325 ml  Net -2161.18 ml   There were no vitals filed for this visit.  Examination: General; Very pleasant elderly male lying in bed in NAD HEENT:  [place]/AT, MM pink/moist, PERRL,  Neuro: Alert and oriented x3 CV: s1s2 regular rate and rhythm, no murmur, rubs, or gallops,  PULM:  Clear to ascultation bilaterally, no increased work, left chest tube in place GI: soft, bowel sounds active in all 4 quadrants, non-tender, non-distended, tolerating oral diet Extremities: warm/dry, no edema  Skin: no rashes or lesions   Resolved Hospital Problem list     Assessment & Plan:  Acute hypoxic respiratory failure  -Secondary to CAP and loculated pleural effusion  Left pleural effusion  -Fluid analysis with 1,857 wbc, (95% neutrophils), glucose less than 20, protein 34, no pH or LDH tested. Protein 4,2. Clear liquid. P: Routine chest tube care  Repeat CXR Patient will likely need pleural lytics  PO Augmentin for an additional 3 weeks  Monitor chest tube output    Best Practice (right click and "Reselect all SmartList Selections" daily)   Per primary   Labs   CBC: Recent Labs  Lab 03/11/21 0629 03/12/21 0318 03/13/21 0256 03/14/21 0606 03/15/21 0123  WBC 13.8* 14.2* 14.8* 18.8* 15.7*  NEUTROABS  --   --   --  15.1*  --   HGB 10.6* 10.4* 10.3* 10.8* 9.9*  HCT 31.4* 30.9* 31.4* 33.2* 31.7*  MCV 92.1 92.2 94.0 95.1 96.1  PLT 399 429* 426* 421* 392    Basic Metabolic Panel: Recent Labs  Lab 03/11/21 0629 03/12/21 0318 03/13/21 0256 03/14/21 0606 03/15/21 0123  NA 130* 133* 134* 138 137  K 4.0 3.9 4.1 4.5 4.4  CL 96* 98 100 102  100  CO2 29 30 29 31  33*  GLUCOSE 166* 194* 194* 253* 187*  BUN 43* 38* 27* 18 15  CREATININE 0.67 0.57* 0.49* 0.69 0.56*  CALCIUM 9.1 9.0 9.2 9.2 9.0  MG 1.7 1.8 1.5* 1.5* 1.7   GFR: Estimated Creatinine Clearance: 99.4 mL/min (A) (by C-G formula based on SCr of 0.56 mg/dL (L)). Recent Labs  Lab 03/12/21 0318 03/13/21 0256 03/14/21 0606 03/15/21 0123  WBC 14.2* 14.8* 18.8* 15.7*    Liver Function Tests: Recent Labs  Lab 03/14/21 0606  AST 12*  ALT 9  ALKPHOS 72  BILITOT 0.4   PROT 5.9*  ALBUMIN 1.9*   No results for input(s): LIPASE, AMYLASE in the last 168 hours. No results for input(s): AMMONIA in the last 168 hours.  ABG    Component Value Date/Time   PHART 7.40 01/09/2021 0311   PCO2ART 42 01/09/2021 0311   PO2ART 85 01/09/2021 0311   HCO3 26.0 01/09/2021 0311   O2SAT 96.4 01/09/2021 0311     Coagulation Profile: Recent Labs  Lab 03/14/21 0606  INR 1.1    Cardiac Enzymes: No results for input(s): CKTOTAL, CKMB, CKMBINDEX, TROPONINI in the last 168 hours.  HbA1C: Hgb A1c MFr Bld  Date/Time Value Ref Range Status  03/11/2021 06:29 AM 6.0 (H) 4.8 - 5.6 % Final    Comment:    (NOTE)         Prediabetes: 5.7 - 6.4         Diabetes: >6.4         Glycemic control for adults with diabetes: <7.0   08/25/2020 01:22 AM 6.1 (H) 4.8 - 5.6 % Final    Comment:    (NOTE) Pre diabetes:          5.7%-6.4%  Diabetes:              >6.4%  Glycemic control for   <7.0% adults with diabetes     CBG: Recent Labs  Lab 03/14/21 1831 03/14/21 2012 03/15/21 0007 03/15/21 0415 03/15/21 0835  GLUCAP 217* 220* 189* 194* 182*    Review of Systems:   Please see the history of present illness. All other systems reviewed and are negative   Past Medical History:  He,  has a past medical history of Bedbound, Bilateral shoulder pain, CHF (congestive heart failure) (HCC), Coronary artery disease, Diabetes mellitus without complication (HCC), GERD (gastroesophageal reflux disease), Glaucoma, HLD (hyperlipidemia), Hypertension, Lumbar stenosis, Non-ischemic cardiomyopathy (HCC), PVC (premature ventricular contraction), and Sleep apnea.   Surgical History:   Past Surgical History:  Procedure Laterality Date   ANTERIOR CERVICAL DECOMP/DISCECTOMY FUSION N/A 01/29/2020   Procedure: ANTERIOR CERVICAL DECOMPRESSION/DISCECTOMY FUSION 1 LEVEL C3/4;  Surgeon: 01/31/2020, MD;  Location: [facility] ORS;  Service: Neurosurgery;  Laterality: N/A;   arm surgery Right     4x surgery as a child, cut arm falling through glass window    HYDROCELE EXCISION / REPAIR     POSTERIOR CERVICAL FUSION/FORAMINOTOMY N/A 05/06/2020   Procedure: C3-4 LAMINECTOMY & C3-4 FUSION;  Surgeon: 05/08/2020, MD;  Location: [facility] ORS;  Service: Neurosurgery;  Laterality: N/A;   REPLACEMENT TOTAL KNEE Right      Social History:   reports that he has never smoked. He has never used smokeless tobacco. He reports that he does not drink alcohol and does not use drugs.   Family History:  His family history is not on file.   Allergies Allergies  Allergen Reactions   Ace Inhibitors Other (See  Comments)    Angioedema   Lisinopril Swelling    Angioedema     Home Medications  Prior to Admission medications   Medication Sig Start Date End Date Taking? Authorizing Provider  acetaminophen (TYLENOL) 500 MG tablet Take 2 tablets (1,000 mg total) by mouth every 6 (six) hours as needed. 03/11/21   Darlin Priestly, MD  amLODipine (NORVASC) 10 MG tablet Hold due to soft blood pressure. 03/11/21   Darlin Priestly, MD  aspirin 81 MG chewable tablet Chew 81 mg by mouth daily.    [provider]  baclofen (LIORESAL) 10 MG tablet Take 5 mg by mouth 3 (three) times daily.    [provider]  brimonidine (ALPHAGAN) 0.2 % ophthalmic solution Place 1 drop into the right eye in the morning and at bedtime. 11/21/20   [provider]  calcium carbonate (TUMS - DOSED IN MG ELEMENTAL CALCIUM) 500 MG chewable tablet Chew 1 tablet by mouth 2 (two) times daily as needed for indigestion or heartburn.    [provider]  carvedilol (COREG) 6.25 MG tablet Take 6.25 mg by mouth 2 (two) times daily. 12/31/20   [provider]  cyanocobalamin 2000 MCG tablet Take 2,000 mcg by mouth daily. 01/24/19   [provider]  famotidine (PEPCID) 20 MG tablet Take 20 mg by mouth every evening.    [provider]  gabapentin (NEURONTIN) 300 MG capsule Take 300 mg by mouth at  bedtime.    [provider]  insulin aspart (NOVOLOG) 100 UNIT/ML injection Hold since blood glucose within goal without any insulin. 03/11/21   Darlin Priestly, MD  insulin detemir (LEVEMIR FLEXTOUCH) 100 UNIT/ML FlexPen Hold since blood glucose within goal without any insulin. 03/11/21   Darlin Priestly, MD  latanoprost (XALATAN) 0.005 % ophthalmic solution Place 1 drop into the right eye at bedtime. 08/20/19   [provider]  meloxicam (MOBIC) 7.5 MG tablet Take 1 tablet (7.5 mg total) by mouth daily as needed for pain. 03/11/21   Darlin Priestly, MD  metFORMIN (GLUCOPHAGE-XR) 500 MG 24 hr tablet Take 500 mg by mouth 2 (two) times daily. 01/19/21   [provider]  Multiple Vitamins-Minerals (CENTRUM SILVER 50+MEN PO) Take 1 tablet by mouth daily.    [provider]  polyethylene glycol powder (GLYCOLAX/MIRALAX) 17 GM/SCOOP powder Take 17 g by mouth daily as needed for moderate constipation. 03/11/21   Darlin Priestly, MD  simvastatin (ZOCOR) 20 MG tablet Take 20 mg by mouth daily at 6 PM.    [provider]  tamsulosin (FLOMAX) 0.4 MG CAPS capsule Take 1 capsule (0.4 mg total) by mouth daily after supper. 01/25/20   Wouk, Wilfred Curtis, MD     Critical care time: N/A   Parneet Glantz D. Tiburcio Pea, NP-C [facility] Personal contact information can be found on Amion  03/15/2021, 10:18 AM

## 2021-03-15 NOTE — Progress Notes (Signed)
PROGRESS NOTE    Roy [name].  JAS:[id number] DOB: 1945-12-19 DOA: 03/14/2021 PCP: Rigoberto Noel, MD    Brief Narrative:  Roy [name] was transferred from [facility] for evaluation for left sided complicated para-pneumonic effusion. Question empyema    75 yo male with the past medical history of paraplegia-bedbound, T2DM, CAD, and non ischemic cardiomyopathy,  Patient admitted to [facility] on 03/07/21 due to acute respiratory failure due to left pleural effusion/ empyema, and left lung pneumonia.  Patient underwent thoracentesis/ chest tube on the left side and received IV antibiotic therapy. Completed 5 days of ceftriaxone and azithromycin.  Required supplemental 02 per [place].  Left pleural effusion with loculation and requiring lysis.  At the time of his transfer his blood pressure was 123/77, HR 67, Temp 98.1, and RR 18 with oxygen saturation 91% Lungs with decreased breath sounds on the left, heart S1 and S2 present and rhythmic, abdomen soft and non tender, no lower extremity edema.    Na 138, K 4,5 Cl 105, bicarbonate 31, glucose 253, BUN 18 and cr 0,69 Mg 1,5 Wbc 18, Hgb 10,8 Hct 33,2 and Plt 421.    Chest film from 03/12/21 positive cardiomegaly with small left pleural effusion, chest tube in place.    EKG 87 bpm, left axis deviation, right bundle branch block, sinus rhythm, no poor R-R wave progression, no significant ST segment or T wave changes.   Patient was transferred to [facility] for further CT surgery evaluation, possible lysis.  I spoke with his wife and she would like to avoid aggressive surgical interventions. Case discussed with CT surgery team, and will consult pulmonary for lytics to left pleural effusion.   Assessment & Plan:   Principal Problem:   Empyema (HCC) Active Problems:   S/P cervical spinal fusion   Essential hypertension   HLD (hyperlipidemia)   Chronic diastolic CHF (congestive heart failure) (HCC)   Type 2 diabetes mellitus with hyperlipidemia  (HCC)   Obesity (BMI 30.0-34.9)   Acute hypoxemic respiratory failure due to community acquired pneumonia complicated with complicated para-pneumonic effusion/ question empyema. Fluid analysis with 1,857 wbc, (95% neutrophils), glucose less than 20, protein 34, no pH or LDH tested. Protein 4,2. Clear liquid. Culture with no growth.  Clinical evidence of loculations.   Oxymetry is 93 on 2 L/min per [place] Wbc is 15,7 and patient has bee afebrile.  Documented chest tube output 75 ml Follow up chest film personally reviewed with persistent left pleural effusion.   Consulted pulmonary for lytics per chest tube.  Continue oxymetry monitoring and supplemental 02 per [place] to target 02 saturation 92% or greater.  Started on Augmentin per pulmonary team.    2. Urinary retention. Chronic foley catheter, urine infection has been ruled out.    3. Non ischemic cardiomyopathy.  Continue with carvedilol.  Echocardiogram last year with EF 60 to 65% on LV, RRV with normal systolic function.  Will recheck echocardiogram on this hospitalization. Hold on diuretic therapy for now.    4. T2DM/ dyslipidemia  Fasting glucose this am is 187.  Continue with insulin siding scale for glucose cover and monitoring.  On statin therapy.    5. Hyponatremia and  hypomagnesemia.  Renal function stable with serum cr at 0,56, K is 4,4 and serum bicarbonate at 33. Mg continue to be low at 1,7 Add 2 g Mag sulfate today, and follow up electrolytes in am.    6. Paraplegia (spinal stenosis sp cervical spine fusion) and neuropathy. Obesity class 1  On baclofen and gabapentin    7. Anemia of chronic disease. Hgb has been stable 9.9 and Hct 31.7 Continue close follow up on cell count, no indication for PRBC transfusion    Patient continue to be at high risk for worsening left pleural effusion   Status is: Inpatient  Remains inpatient appropriate because: left chest tube and persistent left pleural effusion   DVT  prophylaxis: Enoxaparin   Code Status:    DNR  Family Communication:    I spoke with patient's wife at the bedside, we talked in detail about patient's condition, plan of care and prognosis and all questions were addressed.   Consultants:  Pulmonary   Subjective: Patient is feeling better, no nausea or vomiting, his dyspnea is stable.   Objective: Vitals:   03/14/21 2010 03/15/21 0008 03/15/21 0412 03/15/21 0820  BP: 110/69 119/61 126/75 133/76  Pulse: 62 68 76 76  Resp: 20 20 19 20   Temp: 97.9 F (36.6 C) (!) 97.4 F (36.3 C) 97.7 F (36.5 C) (!) 97.3 F (36.3 C)  TempSrc: Oral Oral Oral Oral  SpO2: 95% 92% 93% 94%    Intake/Output Summary (Last 24 hours) at 03/15/2021 1021 Last data filed at 03/15/2021 0641 Gross per 24 hour  Intake 163.82 ml  Output 2325 ml  Net -2161.18 ml   There were no vitals filed for this visit.  Examination:   General: Not in pain or dyspnea, deconditioned  Neurology: Awake and alert, non focal  E ENT: mild pallor, no icterus, oral mucosa moist Cardiovascular: No JVD. S1-S2 present, rhythmic, no gallops, rubs, or murmurs. No lower extremity edema. Pulmonary: positive breath sounds on the right, left with decrease breath sounds Gastrointestinal. Abdomen soft and non tender Skin. No rashes Musculoskeletal: no joint deformities     Data Reviewed: I have personally reviewed following labs and imaging studies  CBC: Recent Labs  Lab 03/11/21 0629 03/12/21 0318 03/13/21 0256 03/14/21 0606 03/15/21 0123  WBC 13.8* 14.2* 14.8* 18.8* 15.7*  NEUTROABS  --   --   --  15.1*  --   HGB 10.6* 10.4* 10.3* 10.8* 9.9*  HCT 31.4* 30.9* 31.4* 33.2* 31.7*  MCV 92.1 92.2 94.0 95.1 96.1  PLT 399 429* 426* 421* 392   Basic Metabolic Panel: Recent Labs  Lab 03/11/21 0629 03/12/21 0318 03/13/21 0256 03/14/21 0606 03/15/21 0123  NA 130* 133* 134* 138 137  K 4.0 3.9 4.1 4.5 4.4  CL 96* 98 100 102 100  CO2 29 30 29 31  33*  GLUCOSE 166* 194*  194* 253* 187*  BUN 43* 38* 27* 18 15  CREATININE 0.67 0.57* 0.49* 0.69 0.56*  CALCIUM 9.1 9.0 9.2 9.2 9.0  MG 1.7 1.8 1.5* 1.5* 1.7   GFR: Estimated Creatinine Clearance: 99.4 mL/min (A) (by C-G formula based on SCr of 0.56 mg/dL (L)). Liver Function Tests: Recent Labs  Lab 03/14/21 0606  AST 12*  ALT 9  ALKPHOS 72  BILITOT 0.4  PROT 5.9*  ALBUMIN 1.9*   No results for input(s): LIPASE, AMYLASE in the last 168 hours. No results for input(s): AMMONIA in the last 168 hours. Coagulation Profile: Recent Labs  Lab 03/14/21 0606  INR 1.1   Cardiac Enzymes: No results for input(s): CKTOTAL, CKMB, CKMBINDEX, TROPONINI in the last 168 hours. BNP (last 3 results) No results for input(s): PROBNP in the last 8760 hours. HbA1C: No results for input(s): HGBA1C in the last 72 hours. CBG: Recent Labs  Lab 03/14/21 1831 03/14/21 2012  03/15/21 0007 03/15/21 0415 03/15/21 0835  GLUCAP 217* 220* 189* 194* 182*   Lipid Profile: No results for input(s): CHOL, HDL, LDLCALC, TRIG, CHOLHDL, LDLDIRECT in the last 72 hours. Thyroid Function Tests: No results for input(s): TSH, T4TOTAL, FREET4, T3FREE, THYROIDAB in the last 72 hours. Anemia Panel: No results for input(s): VITAMINB12, FOLATE, FERRITIN, TIBC, IRON, RETICCTPCT in the last 72 hours.    Radiology Studies: I have reviewed all of the imaging during this hospital visit personally     Scheduled Meds:  aspirin  81 mg Oral Daily   baclofen  5 mg Oral TID   carvedilol  6.25 mg Oral BID WC   Chlorhexidine Gluconate Cloth  6 each Topical Daily   enoxaparin (LOVENOX) injection  40 mg Subcutaneous Q24H   famotidine  20 mg Oral QPM   gabapentin  300 mg Oral QHS   insulin aspart  0-9 Units Subcutaneous TID WC   simvastatin  20 mg Oral q1800   Continuous Infusions:   LOS: 1 day        Geran Haithcock Annett Gula, MD

## 2021-03-15 NOTE — Procedures (Signed)
Pleural Fibrinolytic Administration Procedure Note  Zaire Levesque  [id number]  11-23-1945  Date:03/15/21  Time:11:38 PM   Provider Performing:Audreanna Torrisi D Suzie Portela   Procedure: Pleural Fibrinolysis Subsequent day [phone])  Indication(s) Fibrinolysis of complicated pleural effusion  Consent Risks of the procedure as well as the alternatives and risks of each were explained to the patient and/or caregiver.  Consent for the procedure was obtained.   Anesthesia None   Time Out Verified patient identification, verified procedure, site/side was marked, verified correct patient position, special equipment/implants available, medications/allergies/relevant history reviewed, required imaging and test results available.   Sterile Technique Hand hygiene, gloves   Procedure Description Existing pleural catheter was cleaned and accessed in sterile manner.  10mg  of tPA in 30cc of saline and 5mg  of dornase in 30cc of sterile water were injected into pleural space using existing pleural catheter.  Catheter will be clamped for 1 hour and then placed back to suction.   Complications/Tolerance None; patient tolerated the procedure well.   EBL None   Specimen(s) None  [facility] 03/15/2021, 11:38 PM  Please see [email or web address] for pager details.  From 7A-7P if no response, please call [phone]. After hours, please call ELink [phone].

## 2021-03-16 ENCOUNTER — Inpatient Hospital Stay (HOSPITAL_COMMUNITY): Payer: Medicare HMO

## 2021-03-16 DIAGNOSIS — E785 Hyperlipidemia, unspecified: Secondary | ICD-10-CM | POA: Diagnosis not present

## 2021-03-16 DIAGNOSIS — I5032 Chronic diastolic (congestive) heart failure: Secondary | ICD-10-CM

## 2021-03-16 DIAGNOSIS — I10 Essential (primary) hypertension: Secondary | ICD-10-CM | POA: Diagnosis not present

## 2021-03-16 DIAGNOSIS — J869 Pyothorax without fistula: Secondary | ICD-10-CM | POA: Diagnosis not present

## 2021-03-16 LAB — CBC
HCT: 31.8 % — ABNORMAL LOW (ref 39.0–52.0)
Hemoglobin: 10.1 g/dL — ABNORMAL LOW (ref 13.0–17.0)
MCH: 30.2 pg (ref 26.0–34.0)
MCHC: 31.8 g/dL (ref 30.0–36.0)
MCV: 95.2 fL (ref 80.0–100.0)
Platelets: 406 10*3/uL — ABNORMAL HIGH (ref 150–400)
RBC: 3.34 MIL/uL — ABNORMAL LOW (ref 4.22–5.81)
RDW: 13.2 % (ref 11.5–15.5)
WBC: 15.2 10*3/uL — ABNORMAL HIGH (ref 4.0–10.5)
nRBC: 0 % (ref 0.0–0.2)

## 2021-03-16 LAB — BASIC METABOLIC PANEL
Anion gap: 5 (ref 5–15)
BUN: 13 mg/dL (ref 8–23)
CO2: 31 mmol/L (ref 22–32)
Calcium: 8.8 mg/dL — ABNORMAL LOW (ref 8.9–10.3)
Chloride: 98 mmol/L (ref 98–111)
Creatinine, Ser: 0.6 mg/dL — ABNORMAL LOW (ref 0.61–1.24)
GFR, Estimated: >60 mL/min (ref >60)
Glucose, Bld: 168 mg/dL — ABNORMAL HIGH (ref 70–99)
Potassium: 4.8 mmol/L (ref 3.5–5.1)
Sodium: 134 mmol/L — ABNORMAL LOW (ref 135–145)

## 2021-03-16 LAB — GLUCOSE, CAPILLARY
Glucose-Capillary: 157 mg/dL — ABNORMAL HIGH (ref 70–99)
Glucose-Capillary: 159 mg/dL — ABNORMAL HIGH (ref 70–99)
Glucose-Capillary: 169 mg/dL — ABNORMAL HIGH (ref 70–99)
Glucose-Capillary: 172 mg/dL — ABNORMAL HIGH (ref 70–99)
Glucose-Capillary: 175 mg/dL — ABNORMAL HIGH (ref 70–99)
Glucose-Capillary: 177 mg/dL — ABNORMAL HIGH (ref 70–99)

## 2021-03-16 LAB — ECHOCARDIOGRAM COMPLETE
Area-P 1/2: 4.49 cm2
S' Lateral: 4.8 cm

## 2021-03-16 IMAGING — DX DG CHEST 1V PORT
1 series · 1 of 1 positions shown · non-contrast
Comparison: [DATE]

CLINICAL DATA: Pleural effusion

EXAM:
PORTABLE CHEST 1 VIEW

[chest ap]
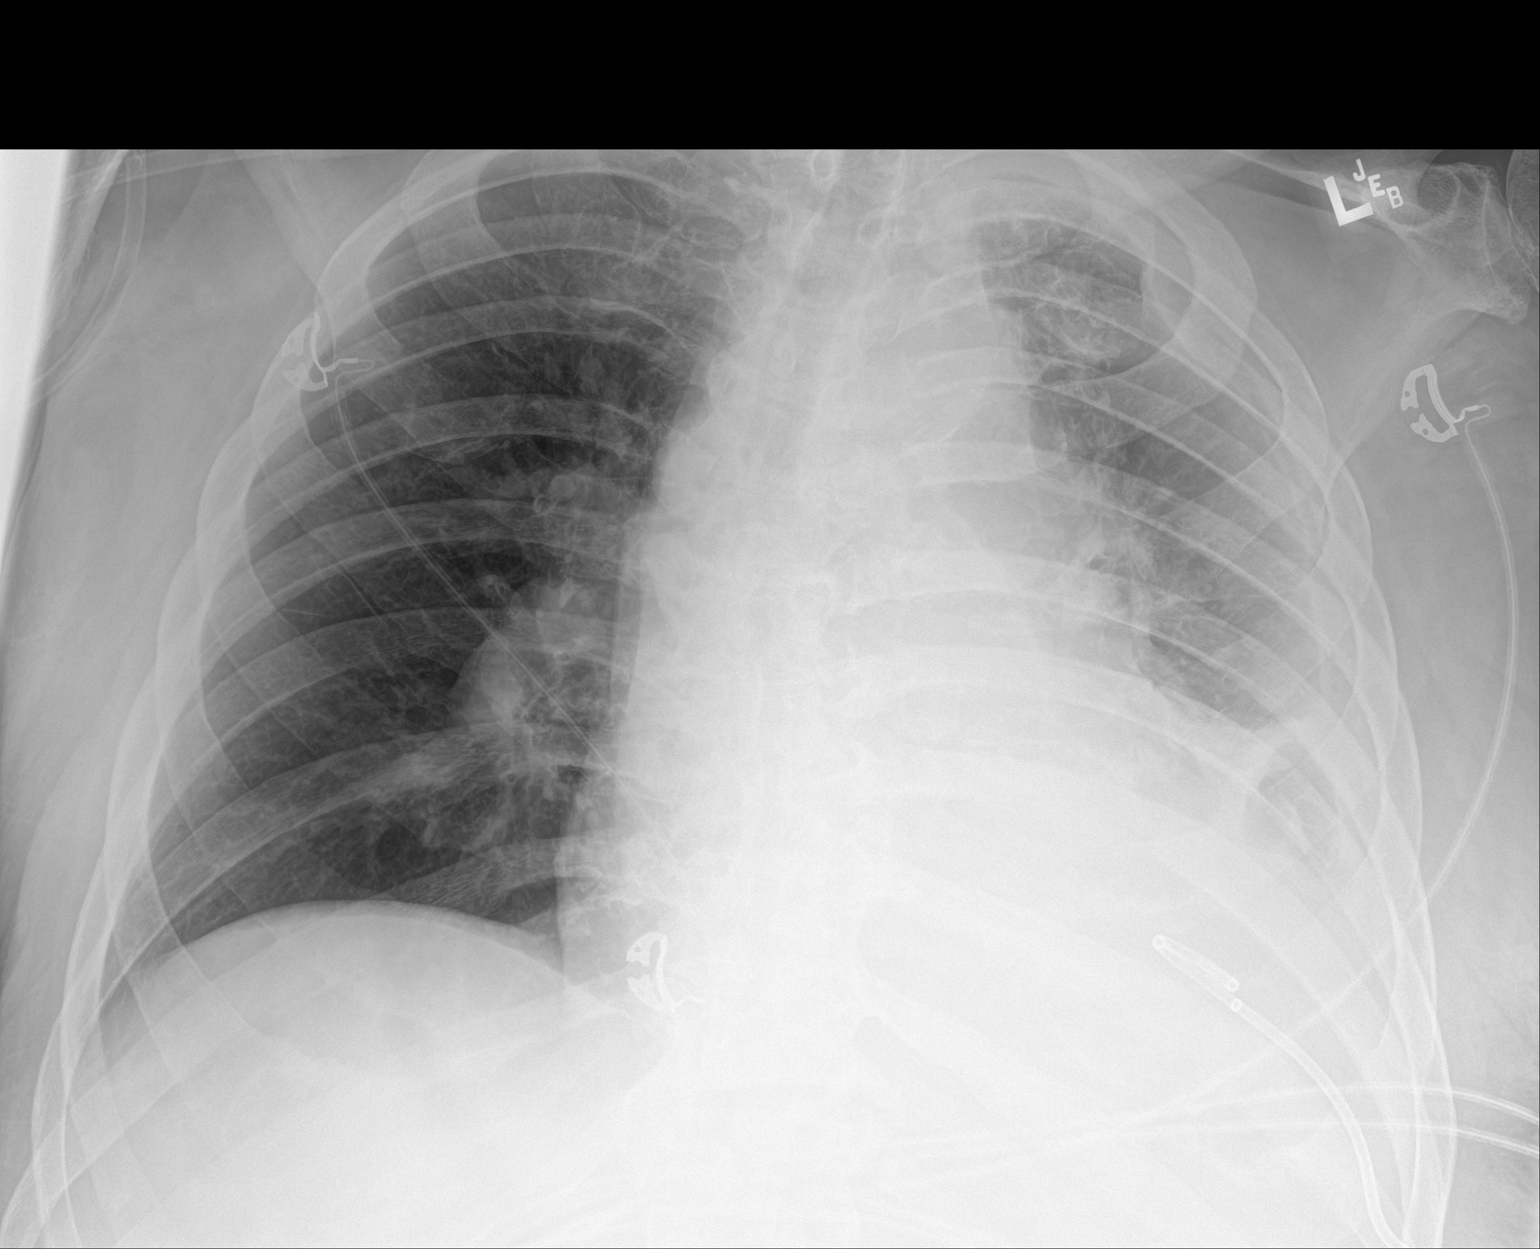

[1 of 1 positions shown; findings below may reference images not displayed]

FINDINGS: Persistent left pleural effusion and left lung
atelectasis/consolidation. Aeration is not substantially changed.
Right lung remains clear. Similar enlargement of the
cardiomediastinal silhouette. Prominent pulmonary arteries.
IMPRESSION: Similar left pleural effusion and left lung
atelectasis/consolidation.

## 2021-03-16 MED ORDER — PERFLUTREN LIPID MICROSPHERE
1.0000 mL | INTRAVENOUS | Status: AC | PRN
Start: 2021-03-16 — End: 2021-03-16
  Administered 2021-03-16: 2 mL via INTRAVENOUS
  Filled 2021-03-16: qty 10

## 2021-03-16 MED ORDER — FUROSEMIDE 10 MG/ML IJ SOLN
40.0000 mg | Freq: Two times a day (BID) | INTRAMUSCULAR | Status: DC
Start: 2021-03-16 — End: 2021-03-17
  Administered 2021-03-16 – 2021-03-17 (×2): 40 mg via INTRAVENOUS
  Filled 2021-03-16 (×2): qty 4

## 2021-03-16 NOTE — TOC Initial Note (Addendum)
Transition of Care (TOC) - Initial/Assessment Note  Sander Radon, BSN Transitions of Care Unit 4E- RN Case Manager See Treatment Team for direct phone #    Patient Details  Name: Roy [name]. MRN: [id number] Date of Birth: [birth year]  Transition of [facility]) CM/SW Contact:    Darrold Span, RN Phone Number: 03/16/2021, 4:13 PM  Clinical Narrative:                 Pt from home w/ wife, referral received for home hospice needs. CM went to room, wife at bedside- choice listed provided for Home Hospice needs- wife indicated via non-verbal cues that she did not want to speak in front of patient, explained to wife that she could review list for home needs, and provided CM contact name and number for her to call when she was ready to discuss Home needs including services and DME. Explained CM would be available to assist and answer any questions.   TOC to continue to follow.   1625- update- received call from wife- Myra regarding plan for Home w/ Hospice- she has reviewed list and selected [facility] for home hospice needs. Discussed DME needs and Myra states she has needed DME at home, other than home 02 which pt was not on prior to admit.  Wife has private duty assistance at home as well M/W/F 4hr/day.  Pt will need EMS transport home, GOLD DNR on chart, address and phone #s confirmed with wife in epic.   Call made to [facility]- spoke with Gillian Scarce for [facility] referral- referral pending review for eligibility and DME arrangements for home 02.   Expected Discharge Plan: Home w Hospice Care Barriers to Discharge: Continued Medical Work up   Patient Goals and CMS Choice Patient states their goals for this hospitalization and ongoing recovery are:: return home CMS Medicare.gov Compare Post Acute Care list provided to:: Patient Represenative (must comment) (wife) Choice offered to / list presented to : Spouse  Expected Discharge Plan and Services Expected  Discharge Plan: Home w Hospice Care   Discharge Planning Services: CM Consult Post Acute Care Choice: Hospice Living arrangements for the past 2 months: Single Family Home                                      Prior Living Arrangements/Services Living arrangements for the past 2 months: Single Family Home Lives with:: Spouse Patient language and need for interpreter reviewed:: Yes        Need for Family Participation in Patient Care: Yes (Comment) Care giver support system in place?: Yes (comment) Current home services: DME (private duty caregiver) Criminal Activity/Legal Involvement Pertinent to Current Situation/Hospitalization: No - Comment as needed  Activities of Daily Living      Permission Sought/Granted                  Emotional Assessment Appearance:: Appears stated age Attitude/Demeanor/Rapport: Engaged Affect (typically observed): Pleasant Orientation: : Oriented to Self, Oriented to Place, Oriented to  Time, Oriented to Situation Alcohol / Substance Use: Not Applicable Psych Involvement: No (comment)  Admission diagnosis:  Empyema (HCC) [J86.9] Patient Active Problem List   Diagnosis Date Noted   Empyema (HCC) 03/14/2021   Malnutrition of moderate degree 03/13/2021   Recurrent left pleural effusion 03/07/2021   Angio-edema    Obesity (BMI 30.0-34.9)    Respiratory failure (HCC) 01/08/2021  Chronic indwelling Foley catheter 08/24/2020   Wheelchair bound 08/24/2020   Complicated UTI (urinary tract infection) 08/24/2020   COVID-19 virus infection 08/24/2020   Acute respiratory failure with hypoxia (HCC) 08/24/2020   NICM (nonischemic cardiomyopathy) (HCC) 08/24/2020   CAD (coronary artery disease) 08/24/2020   Type 2 diabetes mellitus with hyperlipidemia (HCC) 08/24/2020   Cervical myelopathy (HCC) 05/05/2020   Post-operative state 01/31/2020   Essential hypertension    Type 2 diabetes mellitus without complication, with long-term current  use of insulin (HCC)    HLD (hyperlipidemia)    Chronic diastolic CHF (congestive heart failure) (HCC)    S/P cervical spinal fusion 01/29/2020   Weakness    Spinal stenosis    History of progressive weakness    Claudication (HCC)    Lower extremity weakness 01/14/2020   PCP:  Rigoberto Noel, MD Pharmacy:   [facility] [address], [place] - [address] [place] [address] Phone: [phone] Fax: [phone]     Social Determinants of Health (SDOH) Interventions    Readmission Risk Interventions No flowsheet data found.

## 2021-03-16 NOTE — Progress Notes (Signed)
PROGRESS NOTE    Roy [name].  DVV:[id number] DOB: Jun 14, 1945 DOA: 03/14/2021 PCP: Rigoberto Noel, MD    Brief Narrative:  [name] was transferred from [facility] for evaluation for left sided complicated para-pneumonic effusion. Question empyema    75 yo male with the past medical history of paraplegia-bedbound, T2DM, CAD, and non ischemic cardiomyopathy,  Patient admitted to [facility] on 03/07/21 due to acute respiratory failure due to left pleural effusion/ empyema, and left lung pneumonia.  Patient underwent thoracentesis/ chest tube on the left side and received IV antibiotic therapy. Completed 5 days of ceftriaxone and azithromycin.  Required supplemental 02 per [place].  Left pleural effusion with loculation and requiring lysis.  At the time of his transfer his blood pressure was 123/77, HR 67, Temp 98.1, and RR 18 with oxygen saturation 91% Lungs with decreased breath sounds on the left, heart S1 and S2 present and rhythmic, abdomen soft and non tender, no lower extremity edema.    Na 138, K 4,5 Cl 105, bicarbonate 31, glucose 253, BUN 18 and cr 0,69 Mg 1,5 Wbc 18, Hgb 10,8 Hct 33,2 and Plt 421.    Chest film from 03/12/21 positive cardiomegaly with small left pleural effusion, chest tube in place.    EKG 87 bpm, left axis deviation, right bundle branch block, sinus rhythm, no poor R-R wave progression, no significant ST segment or T wave changes.    Patient was transferred to [facility] for further CT surgery evaluation, possible lysis.   I spoke with his wife and she would like to avoid aggressive surgical interventions. Case discussed with CT surgery team, and will consult pulmonary for lytics to left pleural effusion.    Assessment & Plan:   Principal Problem:   Empyema (HCC) Active Problems:   S/P cervical spinal fusion   Essential hypertension   HLD (hyperlipidemia)   Chronic diastolic CHF (congestive heart failure) (HCC)   Type 2 diabetes mellitus with hyperlipidemia  (HCC)   Obesity (BMI 30.0-34.9)   Acute hypoxemic respiratory failure due to community acquired pneumonia complicated with complicated para-pneumonic effusion/ empyema. Fluid analysis with 1,857 wbc, (95% neutrophils), glucose less than 20, protein 34, no pH or LDH tested. Protein 4,2. Clear liquid. Culture with no growth.  Clinical evidence of loculations.    Oxymetry is 96 on 2 L/min per [place] Wbc is 15,2 and patient has bee afebrile.  Documented chest tube output 1,500 ml over last 24 hrs, after fibrinolysis.   Chest film today with positive left pleural effusion.   Continue with Augmentin for a total of 21 days per pulmonary recommendations   Pending final recommendations per pulmonary, his wife is willing to take patient home with chest tube if necessary.  He has been very ill over last months and his quality of life had a rapid decline. His wife (former Scientist, clinical (histocompatibility and immunogenetics)), has requested hospice consult for outpatient follow up. Considering his non ambulatory state and multiple medical problems life expectancy less than 6 months.    2. Urinary retention. Chronic foley catheter, urine infection has been ruled out.    3. Non ischemic cardiomyopathy.  Echocardiogram now worsening LV systolic function, EF is down to 35 to 40% with entire lateral wall and mid distal inferior wall are hypokinetic.   Will start patient on diuresis with IV furosemide to target negative fluid balance that may help with left pleural effusion.    4. T2DM/ dyslipidemia  On insulin siding scale for glucose cover and monitoring.  Fasting glucose  this am is 8  Continue with statin therapy.    5. Hyponatremia and  hypomagnesemia.  Renal function with serum cr at 0,60, K is 4,8 and serum bicarbonate at 31. Positive left pleural effusion and reduction in LV systolic function Plan for diuresis and follow up renal panel in am.    6. Paraplegia (spinal stenosis sp cervical spine fusion) and neuropathy. Obesity class 1 On  baclofen and gabapentin   Patient with rapid decline in overall health, will consult hospice for outpatient follow up.    7. Anemia of chronic disease.  Stable cel count,.     Patient continue to be at high risk for worsening left pleural effusion   Status is: Inpatient  Remains inpatient appropriate because: left chest tube        DVT prophylaxis:  Enoxaparin   Code Status:    DNR   Family Communication:   I spoke with patient's wife  at the bedside, we talked in detail about patient's condition, plan of care and prognosis and all questions were addressed.   Consultants:  Pulmonary   Procedures:  Left chest tube Left thoracentesis   Antimicrobials:  Augmentin     Subjective: Patient with no nausea or vomiting, no chest pain or dyspnea, he has been afebrile. He would like to go home soon. Per his wife at the bedside, he had intermittent confusion but not agitation   Objective: Vitals:   03/16/21 0022 03/16/21 0401 03/16/21 0800 03/16/21 1154  BP: 132/80 118/68 120/88 106/71  Pulse: 84 85 82 70  Resp: 20 20 16 20   Temp: 98.7 F (37.1 C) 98.8 F (37.1 C) 98.2 F (36.8 C) 98 F (36.7 C)  TempSrc: Oral Oral Oral Oral  SpO2: 92% 92% 95% 96%    Intake/Output Summary (Last 24 hours) at 03/16/2021 1351 Last data filed at 03/16/2021 0347 Gross per 24 hour  Intake 80 ml  Output 2600 ml  Net -2520 ml   There were no vitals filed for this visit.  Examination:   General: deconditioned and ill looking appearing  Neurology: Awake and alert, non focal  E ENT: positive pallor, no icterus, oral mucosa moist Cardiovascular: No JVD. S1-S2 present, rhythmic, no gallops, rubs, or murmurs. No lower extremity edema. Pulmonary: positive breath sounds bilaterally, decreased breath sounds on the left with poor inspiratory effort, on anterior auscultation  Gastrointestinal. Abdomen soft and non tender Skin. No rashes Musculoskeletal: no joint deformities     Data  Reviewed: I have personally reviewed following labs and imaging studies  CBC: Recent Labs  Lab 03/12/21 0318 03/13/21 0256 03/14/21 0606 03/15/21 0123 03/16/21 0118  WBC 14.2* 14.8* 18.8* 15.7* 15.2*  NEUTROABS  --   --  15.1*  --   --   HGB 10.4* 10.3* 10.8* 9.9* 10.1*  HCT 30.9* 31.4* 33.2* 31.7* 31.8*  MCV 92.2 94.0 95.1 96.1 95.2  PLT 429* 426* 421* 392 406*   Basic Metabolic Panel: Recent Labs  Lab 03/11/21 0629 03/12/21 0318 03/13/21 0256 03/14/21 0606 03/15/21 0123 03/16/21 0118  NA 130* 133* 134* 138 137 134*  K 4.0 3.9 4.1 4.5 4.4 4.8  CL 96* 98 100 102 100 98  CO2 29 30 29 31  33* 31  GLUCOSE 166* 194* 194* 253* 187* 168*  BUN 43* 38* 27* 18 15 13   CREATININE 0.67 0.57* 0.49* 0.69 0.56* 0.60*  CALCIUM 9.1 9.0 9.2 9.2 9.0 8.8*  MG 1.7 1.8 1.5* 1.5* 1.7  --    GFR:  Estimated Creatinine Clearance: 99.4 mL/min (A) (by C-G formula based on SCr of 0.6 mg/dL (L)). Liver Function Tests: Recent Labs  Lab 03/14/21 0606  AST 12*  ALT 9  ALKPHOS 72  BILITOT 0.4  PROT 5.9*  ALBUMIN 1.9*   No results for input(s): LIPASE, AMYLASE in the last 168 hours. No results for input(s): AMMONIA in the last 168 hours. Coagulation Profile: Recent Labs  Lab 03/14/21 0606  INR 1.1   Cardiac Enzymes: No results for input(s): CKTOTAL, CKMB, CKMBINDEX, TROPONINI in the last 168 hours. BNP (last 3 results) No results for input(s): PROBNP in the last 8760 hours. HbA1C: No results for input(s): HGBA1C in the last 72 hours. CBG: Recent Labs  Lab 03/15/21 2020 03/16/21 0025 03/16/21 0404 03/16/21 0816 03/16/21 1156  GLUCAP 183* 175* 172* 169* 157*   Lipid Profile: No results for input(s): CHOL, HDL, LDLCALC, TRIG, CHOLHDL, LDLDIRECT in the last 72 hours. Thyroid Function Tests: No results for input(s): TSH, T4TOTAL, FREET4, T3FREE, THYROIDAB in the last 72 hours. Anemia Panel: No results for input(s): VITAMINB12, FOLATE, FERRITIN, TIBC, IRON, RETICCTPCT in the last  72 hours.    Radiology Studies: I have reviewed all of the imaging during this hospital visit personally     Scheduled Meds:  amoxicillin-clavulanate  1 tablet Oral Q12H   aspirin  81 mg Oral Daily   baclofen  5 mg Oral TID   carvedilol  6.25 mg Oral BID WC   Chlorhexidine Gluconate Cloth  6 each Topical Daily   enoxaparin (LOVENOX) injection  40 mg Subcutaneous Q24H   famotidine  20 mg Oral QPM   gabapentin  300 mg Oral QHS   insulin aspart  0-9 Units Subcutaneous TID WC   simvastatin  20 mg Oral q1800   sodium chloride flush  10 mL Other Q8H   Continuous Infusions:   LOS: 2 days        Roy [name] Annett Gula, MD

## 2021-03-16 NOTE — Progress Notes (Signed)
Civil engineer, contracting [facility]) Hospital Liaison: RN note      Notified by Transition of Care Manger of patient/family request for [facility] services at home after discharge. Chart and patient information to be reviewed by [facility] physician. Hospice eligibility pending at this time.   Writer spoke to reach out to pt's  spouse tomorrow to initiate education related to hospice philosophy, services and team approach to care.      Please do not hesitate to call with questions.   Thank you for the opportunity to participate in this patient's care.   Gillian Scarce, BSN, RN       [facility] Liaison (listed on [facility] under Hospice /[phone]  [phone] (24h on call)

## 2021-03-17 ENCOUNTER — Inpatient Hospital Stay (HOSPITAL_COMMUNITY): Payer: Medicare HMO

## 2021-03-17 DIAGNOSIS — I5032 Chronic diastolic (congestive) heart failure: Secondary | ICD-10-CM | POA: Diagnosis not present

## 2021-03-17 DIAGNOSIS — I10 Essential (primary) hypertension: Secondary | ICD-10-CM | POA: Diagnosis not present

## 2021-03-17 DIAGNOSIS — J869 Pyothorax without fistula: Secondary | ICD-10-CM | POA: Diagnosis not present

## 2021-03-17 DIAGNOSIS — E785 Hyperlipidemia, unspecified: Secondary | ICD-10-CM | POA: Diagnosis not present

## 2021-03-17 LAB — BASIC METABOLIC PANEL
Anion gap: 6 (ref 5–15)
BUN: 15 mg/dL (ref 8–23)
CO2: 32 mmol/L (ref 22–32)
Calcium: 8.8 mg/dL — ABNORMAL LOW (ref 8.9–10.3)
Chloride: 96 mmol/L — ABNORMAL LOW (ref 98–111)
Creatinine, Ser: 0.66 mg/dL (ref 0.61–1.24)
GFR, Estimated: >60 mL/min (ref >60)
Glucose, Bld: 156 mg/dL — ABNORMAL HIGH (ref 70–99)
Potassium: 4.7 mmol/L (ref 3.5–5.1)
Sodium: 134 mmol/L — ABNORMAL LOW (ref 135–145)

## 2021-03-17 LAB — CBC
HCT: 32.4 % — ABNORMAL LOW (ref 39.0–52.0)
Hemoglobin: 10 g/dL — ABNORMAL LOW (ref 13.0–17.0)
MCH: 30.1 pg (ref 26.0–34.0)
MCHC: 30.9 g/dL (ref 30.0–36.0)
MCV: 97.6 fL (ref 80.0–100.0)
Platelets: 380 10*3/uL (ref 150–400)
RBC: 3.32 MIL/uL — ABNORMAL LOW (ref 4.22–5.81)
RDW: 13.3 % (ref 11.5–15.5)
WBC: 14.3 10*3/uL — ABNORMAL HIGH (ref 4.0–10.5)
nRBC: 0 % (ref 0.0–0.2)

## 2021-03-17 LAB — GLUCOSE, CAPILLARY
Glucose-Capillary: 154 mg/dL — ABNORMAL HIGH (ref 70–99)
Glucose-Capillary: 171 mg/dL — ABNORMAL HIGH (ref 70–99)
Glucose-Capillary: 176 mg/dL — ABNORMAL HIGH (ref 70–99)
Glucose-Capillary: 155 mg/dL — ABNORMAL HIGH (ref 70–99)

## 2021-03-17 IMAGING — DX DG CHEST 1V
2 series · 2 of 2 positions shown · non-contrast
Comparison: Chest radiograph 1 day prior

CLINICAL DATA: Pleural effusion

EXAM:
CHEST  1 VIEW

[chest ap (1 of 2)]
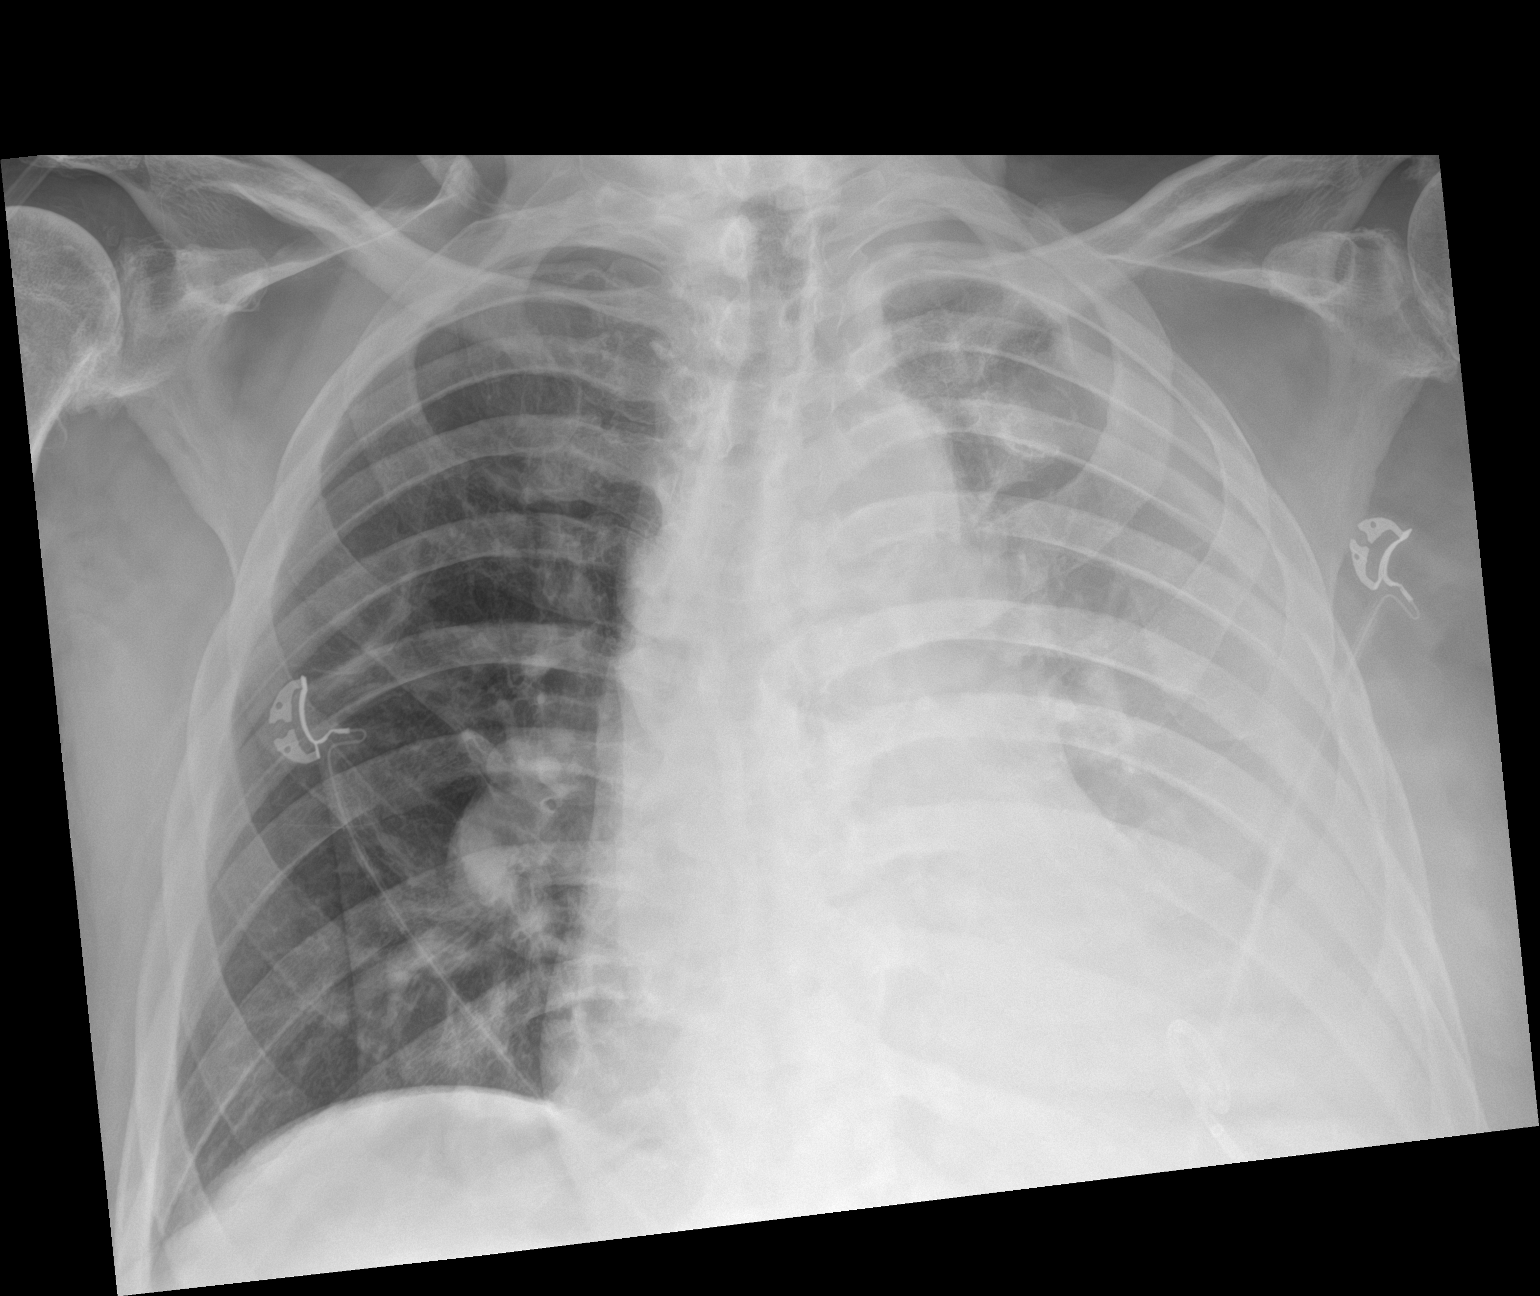

[chest ap (2 of 2)]
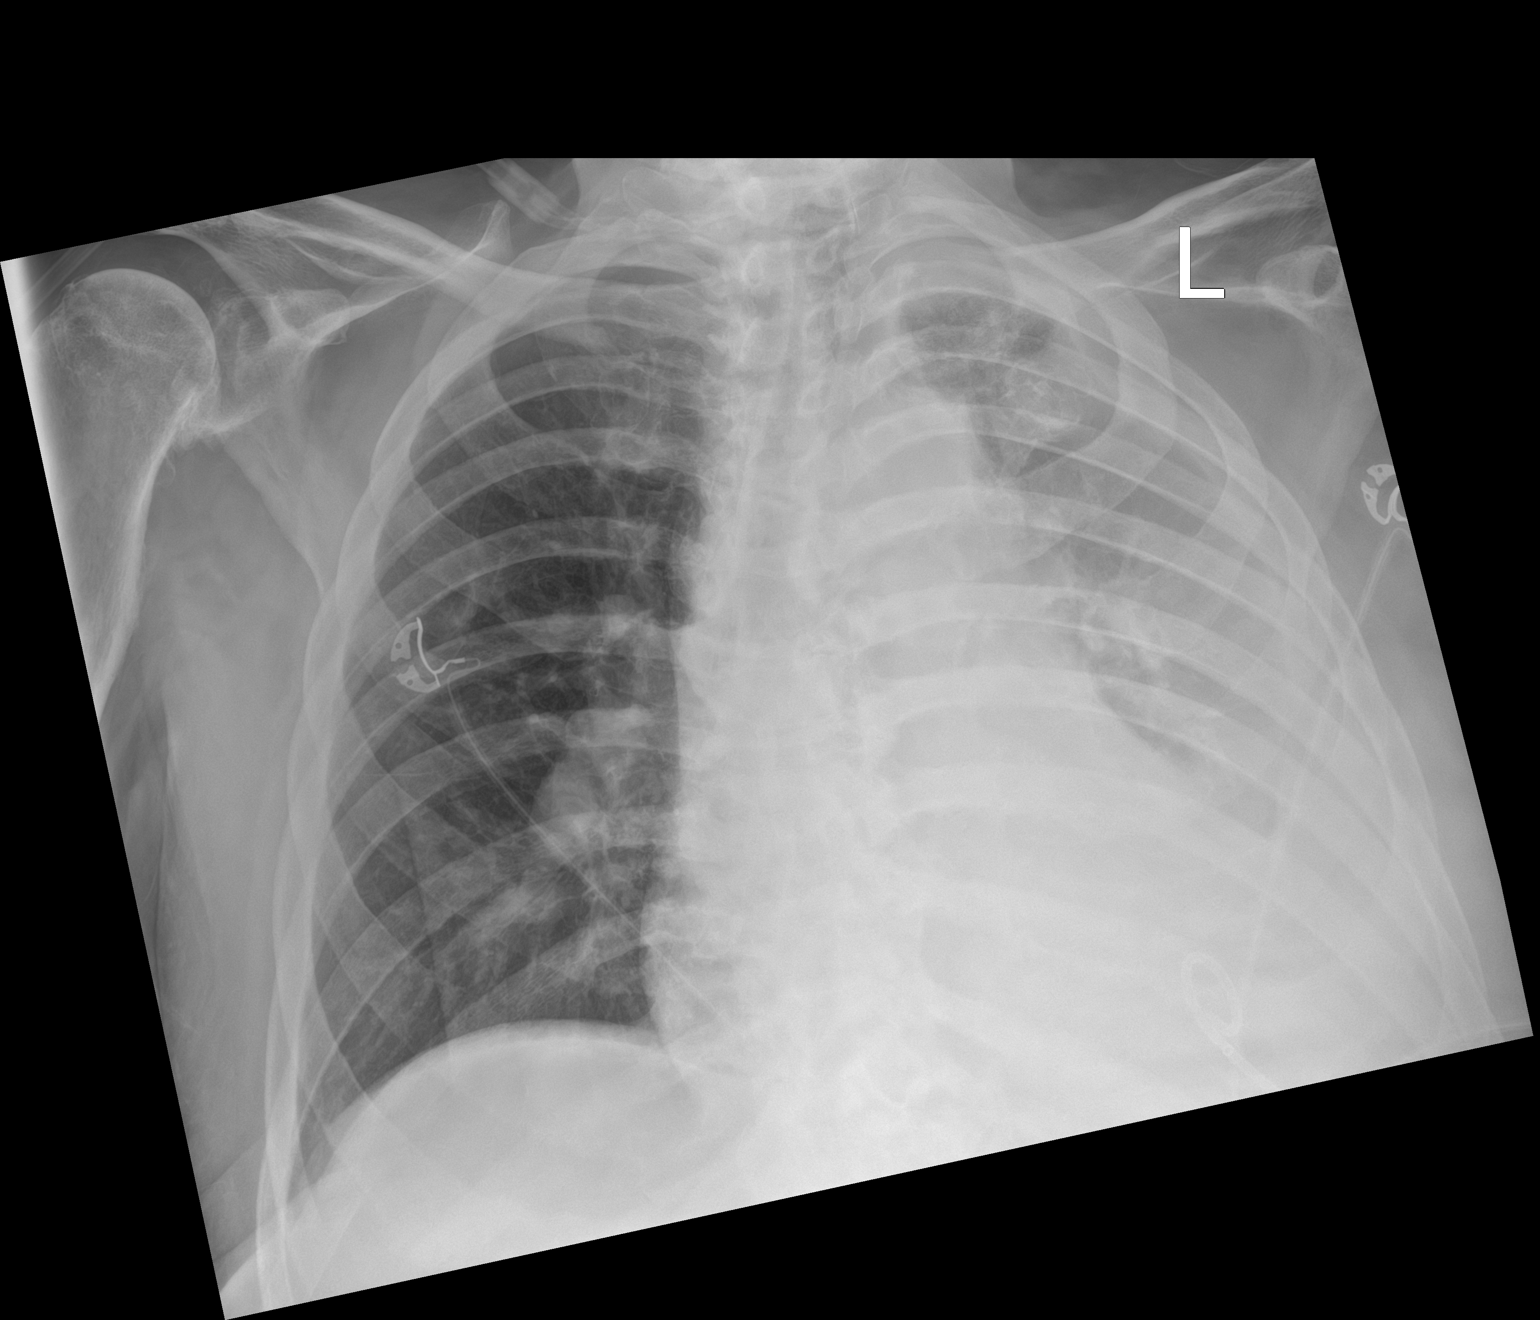

[2 of 2 positions shown; findings below may reference images not displayed]

FINDINGS: A left basilar pigtail catheter is in place.

The cardiomediastinal silhouette is grossly similar, though not well
evaluated.

A large left pleural effusion is again seen, not significantly
changed since the prior study. A small amount of left upper lobe
remains aerated. The right lung is clear. There is no right
effusion. There is no appreciable pneumothorax.

There is no acute osseous abnormality.
IMPRESSION: No significant interval change in size of the large left pleural
effusion with unchanged residual aeration of the left upper lung

## 2021-03-17 MED ORDER — STERILE WATER FOR INJECTION IJ SOLN
5.0000 mg | Freq: Once | RESPIRATORY_TRACT | Status: AC
  Administered 2021-03-17: 5 mg via INTRAPLEURAL
  Filled 2021-03-17: qty 5

## 2021-03-17 MED ORDER — SODIUM CHLORIDE (PF) 0.9 % IJ SOLN
10.0000 mg | Freq: Once | INTRAMUSCULAR | Status: AC
  Administered 2021-03-17: 10 mg via INTRAPLEURAL
  Filled 2021-03-17: qty 10

## 2021-03-17 MED ORDER — FUROSEMIDE 40 MG PO TABS
40.0000 mg | ORAL_TABLET | Freq: Every day | ORAL | Status: DC
  Administered 2021-03-18: 40 mg via ORAL
  Filled 2021-03-17: qty 1

## 2021-03-17 NOTE — Progress Notes (Addendum)
[id number]  [facility]) Hospital Liaison Note  Notified by Transition of Care Manger of patient/family request for [facility] services at home after discharge. Chart and patient information under review by [facility] physician. Hospice eligibility confirmed.  Writer spoke with Myra Comacho(wife) to initiate education related to hospice philosophy, services and team approach to care. Myra verbalized understanding of information given.   Per discussion, plan is for discharge to home by [facility] whenever medically ready. Please send signed and completed DNR form home with patient/family. Patient will need prescriptions for discharge comfort medications.     DME needs have been discussed, patient currently has the following equipment in the home: hoyer lift, hospital bed, BSC, Wheelchair and walker.  Patient/family requests the following DME for delivery to the home: Oxygen. [facility] equipment manager has been notified and will contact DME provider to arrange delivery to the home. Home address has been verified and is correct in the chart.  Myra  is the family member to contact to arrange time of delivery. [facility] aware of the above. Please notify [facility] when patient is ready to leave the unit at discharge. (Call [phone] or [phone] after 5pm.) [facility] information and contact numbers given to.   Please call with any hospice related questions.     [facility] Liaison  [phone]

## 2021-03-17 NOTE — Discharge Summary (Signed)
Physician Discharge Summary   Kate Stoneburner  male DOB: 22-Sep-1945  [id number]  PCP: Lorelee New, MD  Admit date: 03/07/2021 Discharge date: 03/13/2021  Admitted From: home Disposition:  [place]: DNR   Hospital Course:  For full details, please see H&P, progress notes, consult notes and ancillary notes.  Briefly,  Jahmil Yokley. is a 75 y.o. male with medical history significant for paraplegia secondary to spinal stenosis who is bedbound, angioedema from ACE inhibitor status post intubation recently a month ago, recent pneumonia about 6 weeks ago, diabetes mellitus, coronary artery disease, nonischemic cardiomyopathy who presented to the emergency department for hypotension with syncope, shortness of breath and weakness.    History was mostly obtained from wife who is at bedside.  She said she is a retired Immunologist.   Large left pleural effusion with Empyema S/p chest tube placement on 11/28 CT chest showed subtotal atelectasis of LEFT lung and large partially loculated pleural effusion.  After chest tube inserted, pt had large amount of output from chest tube, including purulent material. --pulm consulted, Dr. Lanney Gins saw pt. --Fluid studies notable for Glucose <20, concerning for empyema.  Dr. Lanney Gins rec transferring to [facility] for further evaluation with thoracic surgery for tPA/dornase infusion BID x3 days.  Likely left PNA --WBC 32.9, procal 0.61.   --completed 5 days of ceftriaxone and azithro on 11/30.  Continue both abx, per pulm rec.   Acute hypoxemic respiratory failure --tele in the ED showed O2 sats in mid 80's, pt needed 2-3L [place].  Now down to 1L O2.  Urinary tract infection, ruled out --denied dysuria or suprapubic tenderness.  UA obtained from the Foley bag, so considered contaminated.     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 --cont home Foley --cont  Flomax  Spinal stenosis with paraplegia. Patient is completely bedbound since October 2021 --cont home Baclofen --partial air mattress  Nonischemic cardiomyopathy.   --per wife, EF used to be 17%, but has improved to 60-65% a year ago.  Not on diuretic at home. --cont home coreg  Hyponatremia --Na of 125 on presentation.  Improved to 130 prior to discharge with just oral hydration.   DM2 --recent A1c 6.1, too low for his age --BG during hospitalization have been wnl without any insulin.  Fingersticks d/c'ed.  Home insulin held at discharge. --Home metformin resumed at discharge.    Discharge Diagnoses:  Principal Problem:   Recurrent left pleural effusion Active Problems:   Malnutrition of moderate degree   30 Day Unplanned Readmission Risk Score    Flowsheet Row ED to Hosp-Admission (Current) from 03/07/2021 in [place]  30 Day Unplanned Readmission Risk Score (%) 14.42 Filed at 03/11/2021 1600       This score is the patient's risk of an unplanned readmission within 30 days of being discharged (0 -100%). The score is based on dignosis, age, lab data, medications, orders, and past utilization.   Low:  0-14.9   Medium: 15-21.9   High: 22-29.9   Extreme: 30 and above         Discharge Instructions:  Allergies as of 03/13/2021       Reactions   Ace Inhibitors Other (See Comments)   Angioedema   Lisinopril Swelling   Angioedema        Medication List     TAKE these medications    acetaminophen 500 MG tablet Commonly known  as: TYLENOL Take 2 tablets (1,000 mg total) by mouth every 6 (six) hours as needed. What changed:  when to take this reasons to take this   amLODipine 10 MG tablet Commonly known as: NORVASC Hold due to soft blood pressure. What changed:  how much to take how to take this when to take this additional instructions   aspirin 81 MG chewable tablet Chew 81 mg by mouth daily.   baclofen 10  MG tablet Commonly known as: LIORESAL Take 5 mg by mouth 3 (three) times daily.   brimonidine 0.2 % ophthalmic solution Commonly known as: ALPHAGAN Place 1 drop into the right eye in the morning and at bedtime.   calcium carbonate 500 MG chewable tablet Commonly known as: TUMS - dosed in mg elemental calcium Chew 1 tablet by mouth 2 (two) times daily as needed for indigestion or heartburn.   carvedilol 6.25 MG tablet Commonly known as: COREG Take 6.25 mg by mouth 2 (two) times daily.   CENTRUM SILVER 50+MEN PO Take 1 tablet by mouth daily.   cyanocobalamin 2000 MCG tablet Take 2,000 mcg by mouth daily.   famotidine 20 MG tablet Commonly known as: PEPCID Take 20 mg by mouth every evening.   gabapentin 300 MG capsule Commonly known as: NEURONTIN Take 300 mg by mouth at bedtime.   insulin aspart 100 UNIT/ML injection Commonly known as: novoLOG Hold since blood glucose within goal without any insulin. What changed:  how much to take how to take this when to take this additional instructions   latanoprost 0.005 % ophthalmic solution Commonly known as: XALATAN Place 1 drop into the right eye at bedtime.   Levemir FlexTouch 100 UNIT/ML FlexPen Generic drug: insulin detemir Hold since blood glucose within goal without any insulin. What changed:  how much to take how to take this when to take this additional instructions   meloxicam 7.5 MG tablet Commonly known as: MOBIC Take 1 tablet (7.5 mg total) by mouth daily as needed for pain. What changed:  when to take this reasons to take this   metFORMIN 500 MG 24 hr tablet Commonly known as: GLUCOPHAGE-XR Take 500 mg by mouth 2 (two) times daily.   polyethylene glycol powder 17 GM/SCOOP powder Commonly known as: GLYCOLAX/MIRALAX Take 17 g by mouth daily as needed for moderate constipation. What changed: how much to take   simvastatin 20 MG tablet Commonly known as: ZOCOR Take 20 mg by mouth daily at 6 PM.    tamsulosin 0.4 MG Caps capsule Commonly known as: FLOMAX Take 1 capsule (0.4 mg total) by mouth daily after supper.          Allergies  Allergen Reactions   Ace Inhibitors Other (See Comments)    Angioedema   Lisinopril Swelling    Angioedema     The results of significant diagnostics from this hospitalization (including imaging, microbiology, ancillary and laboratory) are listed below for reference.   Consultations:   Procedures/Studies: DG Chest 1 View  Result Date: 03/17/2021 CLINICAL DATA:  Pleural effusion EXAM: CHEST  1 VIEW COMPARISON:  Chest radiograph 1 day prior FINDINGS: A left basilar pigtail catheter is in place. The cardiomediastinal silhouette is grossly similar, though not well evaluated. A large left pleural effusion is again seen, not significantly changed since the prior study. A small amount of left upper lobe remains aerated. The right lung is clear. There is no right effusion. There is no appreciable pneumothorax. There is no acute osseous abnormality. IMPRESSION: No significant  interval change in size of the large left pleural effusion with unchanged residual aeration of the left upper lung Electronically Signed   By: Lesia Hausen M.D.   On: 03/17/2021 16:09   DG Chest 2 View  Result Date: 03/12/2021 CLINICAL DATA:  Empyema EXAM: CHEST - 2 VIEW COMPARISON:  Chest radiograph 03/07/2021 FINDINGS: There is a new pigtail catheter in place projecting over the left base. The heart is markedly enlarged, unchanged. The left pleural effusion has decreased in size following chest tube placement, and there is improved aeration of the left upper lobe. The right lung is clear. There is no right effusion. There is no pneumothorax. There is no acute osseous abnormality. IMPRESSION: Decreased size of the left pleural effusion following pigtail catheter placement. Electronically Signed   By: Lesia Hausen M.D.   On: 03/12/2021 10:36   DG Chest 2 View  Result Date:  03/07/2021 CLINICAL DATA:  Hypotension, shortness of breath, weakness, angioedema, bundle branch block EXAM: CHEST - 2 VIEW COMPARISON:  01/09/2021 FINDINGS: Enlargement of cardiac silhouette. Large LEFT pleural effusion with subtotal atelectasis of LEFT lung. Underlying infiltrate and other abnormalities including tumor not excluded in this setting. Minimal atelectasis at RIGHT base. Remaining RIGHT lung clear. No pneumothorax or acute osseous findings. Osseous demineralization with degenerative changes of the glenohumeral joints and thoracic spine. IMPRESSION: Large LEFT pleural effusion with subtotal atelectasis of LEFT lung. Enlargement of cardiac silhouette with minimal RIGHT basilar atelectasis. Electronically Signed   By: Ulyses Southward M.D.   On: 03/07/2021 11:16   CT Chest W Contrast  Result Date: 03/07/2021 CLINICAL DATA:  Pleural effusion, hypotension, shortness of breath, weakness EXAM: CT CHEST WITH CONTRAST TECHNIQUE: Multidetector CT imaging of the chest was performed during intravenous contrast administration. CONTRAST:  50mL OMNIPAQUE IOHEXOL 300 MG/ML  SOLN IV COMPARISON:  Chest radiograph 03/07/2021 FINDINGS: Cardiovascular: Atherosclerotic calcifications aorta, coronary arteries, and proximal great vessels. 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 Mild enlargement of cardiac chambers. Minimal pericardial effusion. Mediastinum/Nodes: Base of cervical region normal appearance. Esophagus unremarkable. 11 mm short axis AP window lymph node image 80. Multiple additional normal sized mediastinal lymph nodes. Fluid collection at the RIGHT paratracheal region 24 x 18 mm likely small nonspecific cyst, less likely fluid within a superior pericardial recess. Lungs/Pleura: Partially loculated large LEFT pleural effusion. Complete atelectasis LEFT lower lobe with subtotal atelectasis of LEFT upper lobe. Fluid-filled lower lobe airways. Small RIGHT pleural effusion and minimal  basilar atelectasis. Upper Abdomen: Liver, spleen, adrenal glands, pancreas, and gallbladder unremarkable. Small cysts at upper pole pole of RIGHT kidney. No acute upper abdominal findings. Musculoskeletal: Advanced degenerative changes BILATERAL glenohumeral joints and LEFT sternoclavicular joint. Advanced degenerative changes of the thoracic spine. IMPRESSION: Large partially loculated LEFT pleural effusion with complete atelectasis of LEFT lower lobe and subtotal atelectasis of LEFT upper lobe. Small RIGHT pleural effusion and minimal basilar atelectasis. Nonspecific mildly enlarged AP window lymph node. Extensive atherosclerotic disease changes including coronary arteries. Aortic Atherosclerosis (ICD10-I70.0). Electronically Signed   By: Ulyses Southward M.D.   On: 03/07/2021 12:34   DG CHEST PORT 1 VIEW  Result Date: 03/16/2021 CLINICAL DATA:  Pleural effusion EXAM: PORTABLE CHEST 1 VIEW COMPARISON:  03/15/2021 FINDINGS: Persistent left pleural effusion and left lung atelectasis/consolidation. Aeration is not substantially changed. Right lung remains clear. Similar enlargement of the cardiomediastinal silhouette. Prominent pulmonary arteries. IMPRESSION: Similar left pleural effusion and left lung atelectasis/consolidation. Electronically Signed   By: Guadlupe Spanish M.D.   On: 03/16/2021  08:43   DG CHEST PORT 1 VIEW  Result Date: 03/15/2021 CLINICAL DATA:  Chest tube in place EXAM: PORTABLE CHEST 1 VIEW COMPARISON:  03/12/2021 FINDINGS: Increased opacification of the left hemithorax likely secondary to larger effusion and greater atelectasis. Right lung remains clear. No pneumothorax. Cardiomegaly. Chest tube overlies the left lung base. IMPRESSION: Increased left pleural effusion and left lung atelectasis. Left basilar chest tube present. Electronically Signed   By: Macy Mis M.D.   On: 03/15/2021 12:26   ECHOCARDIOGRAM COMPLETE  Result Date: 03/16/2021    ECHOCARDIOGRAM REPORT   Patient Name:    Eiker Saed. Date of Exam: 03/16/2021 Medical Rec #:  [id number]             Height:       72.0 in Accession #:    [id number]            Weight:       229.0 lb Date of Birth:  1945-06-10             BSA:          2.257 m Patient Age:    52 years              BP:           122/84 mmHg Patient Gender: M                     HR:           67 bpm. Exam Location:  Inpatient Procedure: 2D Echo, Cardiac Doppler and Color Doppler Indications:    chf  History:        Patient has prior history of Echocardiogram examinations, most                 recent 01/15/2020. Cardiomyopathy, Signs/Symptoms:Chest Pain and                 Shortness of Breath; Risk Factors:Hypertension, Diabetes and                 Dyslipidemia.  Sonographer:    Melissa Morford RDCS (AE, PE) Referring Phys: [id number] [place]  1. Left ventricular ejection fraction, by estimation, is 35 to 40%. The left ventricle has moderately decreased function. The left ventricle demonstrates regional wall motion abnormalities (see scoring diagram/findings for description). The left ventricular internal cavity size was mildly dilated. There is mild left ventricular hypertrophy. Left ventricular diastolic parameters are consistent with Grade I diastolic dysfunction (impaired relaxation).  2. Right ventricular systolic function is normal. The right ventricular size is mildly enlarged. Tricuspid regurgitation signal is inadequate for assessing PA pressure.  3. The mitral valve is normal in structure. Trivial mitral valve regurgitation. No evidence of mitral stenosis.  4. The aortic valve was not well visualized. Aortic valve regurgitation is not visualized. No aortic stenosis is present.  5. The inferior vena cava is dilated in size with >50% respiratory variability, suggesting right atrial pressure of 8 mmHg. FINDINGS  Left Ventricle: Left ventricular ejection fraction, by estimation, is 35 to 40%. The left ventricle has moderately decreased  function. The left ventricle demonstrates regional wall motion abnormalities. Definity contrast agent was given IV to delineate the left ventricular endocardial borders. The left ventricular internal cavity size was mildly dilated. There is mild left ventricular hypertrophy. Left ventricular diastolic parameters are consistent with Grade I diastolic dysfunction (impaired relaxation).  LV Wall Scoring: The entire lateral wall and mid and  distal inferior wall are hypokinetic. The entire anterior wall, entire septum, basal inferior segment, and apex are normal. Right Ventricle: The right ventricular size is mildly enlarged. No increase in right ventricular wall thickness. Right ventricular systolic function is normal. Tricuspid regurgitation signal is inadequate for assessing PA pressure. Left Atrium: Left atrial size was not well visualized. Right Atrium: Right atrial size was not well visualized. Pericardium: There is no evidence of pericardial effusion. Mitral Valve: The mitral valve is normal in structure. Trivial mitral valve regurgitation. No evidence of mitral valve stenosis. Tricuspid Valve: The tricuspid valve is normal in structure. Tricuspid valve regurgitation is trivial. Aortic Valve: The aortic valve was not well visualized. Aortic valve regurgitation is not visualized. No aortic stenosis is present. Pulmonic Valve: The pulmonic valve was not well visualized. Pulmonic valve regurgitation is not visualized. Aorta: The aortic root is normal in size and structure. Venous: The inferior vena cava is dilated in size with greater than 50% respiratory variability, suggesting right atrial pressure of 8 mmHg. IAS/Shunts: The interatrial septum was not well visualized.  LEFT VENTRICLE PLAX 2D LVIDd:         5.80 cm   Diastology LVIDs:         4.80 cm   LV e' medial:    6.20 cm/s LV PW:         1.10 cm   LV E/e' medial:  10.1 LV IVS:        0.90 cm   LV e' lateral:   6.20 cm/s LVOT diam:     2.30 cm   LV E/e'  lateral: 10.1 LV SV:         53 LV SV Index:   23 LVOT Area:     4.15 cm  RIGHT VENTRICLE TAPSE (M-mode): 2.1 cm LEFT ATRIUM         Index LA diam:    3.30 cm 1.46 cm/m  AORTIC VALVE LVOT Vmax:   82.90 cm/s LVOT Vmean:  59.600 cm/s LVOT VTI:    0.127 m  AORTA Ao Root diam: 3.90 cm MITRAL VALVE MV Area (PHT): 4.49 cm    SHUNTS MV Decel Time: 169 msec    Systemic VTI:  0.13 m MV E velocity: 62.60 cm/s  Systemic Diam: 2.30 cm MV A velocity: 99.00 cm/s MV E/A ratio:  0.63 Oswaldo Milian MD Electronically signed by Oswaldo Milian MD Signature Date/Time: 03/16/2021/11:51:59 AM    Final    CT PERC PLEURAL DRAIN W/INDWELL CATH W/IMG GUIDE  Result Date: 03/10/2021 INDICATION: Large left pleural effusion EXAM: CT-guided placement of left-sided chest tube MEDICATIONS: None ANESTHESIA/SEDATION: Anxiolysis and local analgesia only COMPLICATIONS: None immediate. PROCEDURE: Informed written consent was obtained from the patient after a thorough discussion of the procedural risks, benefits and alternatives. All questions were addressed. Maximal Sterile Barrier Technique was utilized including caps, mask, sterile gowns, sterile gloves, sterile drape, hand hygiene and skin antiseptic. A timeout was performed prior to the initiation of the procedure. The patient was placed supine on the exam table. Limited CT of the chest was performed for planning purposes. This again demonstrated a large left pleural effusion. Skin entry site was marked, and the overlying skin was prepped and draped in a standard sterile fashion. Local analgesia was obtained with 1% lidocaine. Under intermittent CT fluoroscopy, a 10 French locking multipurpose drainage catheter was advanced into the left pleural fluid using trocar technique. Location was confirmed with CT imaging and return of clear, serous fluid. A sample was collected  and sent to the lab for analysis. The catheter was secured to the skin using silk suture and a dressing. It was  attached to pleura vac drainage system connected to low continuous suction. The patient tolerated the procedure well without immediate complication. IMPRESSION: 1. Successful CT-guided placement of a 10 French locking multipurpose drainage catheter in the left pleural space. 2. Left pleural drainage catheter attached to Pleur-evac drainage system to low continuous suction. Further recommendations per pulmonology/thoracic surgery. Please call IR with any further questions. Electronically Signed   By: Olive Bass M.D.   On: 03/10/2021 09:20      Labs: BNP (last 3 results) Recent Labs    03/07/21 1044  BNP 485.0*   Basic Metabolic Panel: Recent Labs  Lab 03/11/21 0629 03/12/21 0318 03/13/21 0256 03/14/21 0606 03/15/21 0123 03/16/21 0118 03/17/21 0338  NA 130* 133* 134* 138 137 134* 134*  K 4.0 3.9 4.1 4.5 4.4 4.8 4.7  CL 96* 98 100 102 100 98 96*  CO2 29 30 29 31  33* 31 32  GLUCOSE 166* 194* 194* 253* 187* 168* 156*  BUN 43* 38* 27* 18 15 13 15   CREATININE 0.67 0.57* 0.49* 0.69 0.56* 0.60* 0.66  CALCIUM 9.1 9.0 9.2 9.2 9.0 8.8* 8.8*  MG 1.7 1.8 1.5* 1.5* 1.7  --   --    Liver Function Tests: Recent Labs  Lab 03/14/21 0606  AST 12*  ALT 9  ALKPHOS 72  BILITOT 0.4  PROT 5.9*  ALBUMIN 1.9*   No results for input(s): LIPASE, AMYLASE in the last 168 hours. No results for input(s): AMMONIA in the last 168 hours. CBC: Recent Labs  Lab 03/13/21 0256 03/14/21 0606 03/15/21 0123 03/16/21 0118 03/17/21 0338  WBC 14.8* 18.8* 15.7* 15.2* 14.3*  NEUTROABS  --  15.1*  --   --   --   HGB 10.3* 10.8* 9.9* 10.1* 10.0*  HCT 31.4* 33.2* 31.7* 31.8* 32.4*  MCV 94.0 95.1 96.1 95.2 97.6  PLT 426* 421* 392 406* 380   Cardiac Enzymes: No results for input(s): CKTOTAL, CKMB, CKMBINDEX, TROPONINI in the last 168 hours. BNP: Invalid input(s): POCBNP CBG: Recent Labs  Lab 03/16/21 1614 03/16/21 2108 03/17/21 0619 03/17/21 1058 03/17/21 1605  GLUCAP 159* 177* 154* 171* 176*    D-Dimer No results for input(s): DDIMER in the last 72 hours. Hgb A1c No results for input(s): HGBA1C in the last 72 hours. Lipid Profile No results for input(s): CHOL, HDL, LDLCALC, TRIG, CHOLHDL, LDLDIRECT in the last 72 hours. Thyroid function studies No results for input(s): TSH, T4TOTAL, T3FREE, THYROIDAB in the last 72 hours.  Invalid input(s): FREET3 Anemia work up No results for input(s): VITAMINB12, FOLATE, FERRITIN, TIBC, IRON, RETICCTPCT in the last 72 hours. Urinalysis    Component Value Date/Time   COLORURINE AMBER (A) 03/07/2021 1231   APPEARANCEUR CLEAR 03/07/2021 1231   LABSPEC >1.030 (H) 03/07/2021 1231   PHURINE 5.5 03/07/2021 1231   GLUCOSEU NEGATIVE 03/07/2021 1231   HGBUR NEGATIVE 03/07/2021 1231   BILIRUBINUR SMALL (A) 03/07/2021 1231   KETONESUR NEGATIVE 03/07/2021 1231   PROTEINUR 100 (A) 03/07/2021 1231   NITRITE POSITIVE (A) 03/07/2021 1231   LEUKOCYTESUR SMALL (A) 03/07/2021 1231   Sepsis Labs Invalid input(s): PROCALCITONIN,  WBC,  LACTICIDVEN Microbiology Recent Results (from the past 240 hour(s))  Body fluid culture w Gram Stain     Status: None   Collection Time: 03/09/21  5:43 PM   Specimen: Pleura; Body Fluid  Result Value Ref Range  Status   Specimen Description   Final    PLEURAL Performed at [facility], [address] [place], [place] [address]    Special Requests   Final    LEFT LUNG Performed at [facility], [place], [place] [address]    Gram Stain NO WBC SEEN NO ORGANISMS SEEN   Final   Culture   Final    NO GROWTH 3 DAYS Performed at [facility], [place] [address] [place], [place] [address]    Report Status 03/13/2021 FINAL  Final  Acid Fast Smear (AFB)     Status: None   Collection Time: 03/09/21  5:43 PM   Specimen: Chest; Lung  Result Value Ref Range Status   AFB Specimen Processing Concentration  Final   Acid Fast Smear Negative  Final    Comment: (NOTE) Performed At:  [facility] [address] [place], [place] [id number] Rush Farmer MD [id number]    Source (AFB) PLEURAL  Final    Comment: Performed at [facility], [address] [place], [place] [address]  Fungus Culture With Stain     Status: None (Preliminary result)   Collection Time: 03/09/21  5:43 PM   Specimen: Lung, Left  Result Value Ref Range Status   Fungus Stain Final report  Final    Comment: (NOTE) Performed At: [facility] [place], [place] [id number] Rush Farmer MD [id number]    Fungus (Mycology) Culture PENDING  Incomplete   Fungal Source PLEURAL  Final    Comment: Performed at [facility], [address] [place], [place] [address]  Fungus Culture Result     Status: None   Collection Time: 03/09/21  5:43 PM  Result Value Ref Range Status   Result 1 Comment  Final    Comment: (NOTE) KOH/Calcofluor preparation:  no fungus observed. Performed At: [facility] [place], [place] [id number] Rush Farmer MD [id number]      Total time spend on discharging this patient, including the last patient exam, discussing the hospital stay, instructions for ongoing care as it relates to all pertinent caregivers, as well as preparing the medical discharge records, prescriptions, and/or referrals as applicable, is 50 minutes.    Karie Kirks, MD  [facility] 03/17/2021, 5:50 PM

## 2021-03-17 NOTE — Progress Notes (Signed)
PROGRESS NOTE    Roy [name].  LDJ:[id number] DOB: 1945/09/18 DOA: 03/14/2021 PCP: Rigoberto Noel, MD    Brief Narrative:  Roy [name] was transferred from [facility] for evaluation for left sided complicated para-pneumonic effusion, empyema    75 yo male with the past medical history of paraplegia-bedbound (cervical spinal stenosis), T2DM, CAD, and non ischemic cardiomyopathy,  Patient admitted to [facility] on 03/07/21 due to acute respiratory failure due to left pleural effusion/ empyema, and left lung pneumonia.  Patient underwent thoracentesis/ chest tube on the left side and received IV antibiotic therapy. Completed 5 days of ceftriaxone and azithromycin.  Required supplemental 02 per [place].  Left pleural effusion with loculation and requiring lysis. (Transfer to [facility]) At the time of his transfer his blood pressure was 123/77, HR 67, Temp 98.1, and RR 18 with oxygen saturation 91% Lungs with decreased breath sounds on the left, heart S1 and S2 present and rhythmic, abdomen soft and non tender, no lower extremity edema.    Na 138, K 4,5 Cl 105, bicarbonate 31, glucose 253, BUN 18 and cr 0,69 Mg 1,5 Wbc 18, Hgb 10,8 Hct 33,2 and Plt 421.    Chest film from 03/12/21 positive cardiomegaly with small left pleural effusion, chest tube in place.    EKG 87 bpm, left axis deviation, right bundle branch block, sinus rhythm, no poor R-R wave progression, no significant ST segment or T wave changes.    Patient was transferred to [facility] for further CT surgery evaluation, possible lysis.   I spoke with his wife and she would like to avoid aggressive surgical interventions. Case discussed with CT surgery team, and consulted pulmonary for lytics to left pleural effusion.   12/04 and 12/06 fibronolysis with improvement in chest tube output. (1.500 ml over the first 24 hrs after first treatment on 12/04).   Plan to follow up response to loca therapy, possible removal of chest tube before patient's  discharge home.  He has been very ill over last months and his quality of life had a rapid decline. His wife (former Scientist, clinical (histocompatibility and immunogenetics)), has requested hospice consult for outpatient follow up. Considering his non ambulatory state and multiple medical problems life expectancy less than 6 months.    Assessment & Plan:   Principal Problem:   Empyema (HCC) Active Problems:   S/P cervical spinal fusion   Essential hypertension   HLD (hyperlipidemia)   Chronic diastolic CHF (congestive heart failure) (HCC)   Type 2 diabetes mellitus with hyperlipidemia (HCC)   Obesity (BMI 30.0-34.9)   Acute hypoxemic respiratory failure due to community acquired pneumonia complicated with complicated para-pneumonic effusion/ empyema. Fluid analysis with 1,857 wbc, (95% neutrophils), glucose less than 20, protein 34, no pH or LDH tested. Protein 4,2. Clear liquid. Culture with no growth.  Clinical evidence of loculations.    Wbc is 14.3 from 15.2  Documented chest tube output 110 ml yesterday Had 2nd treatment with fibrinolysis today. Chest tube is connected to suction 20 cm H20 and has no air leak.    Chest film today with positive left pleural effusion.   On Augmentin for a total of 21 days per pulmonary recommendations  Plan to follow up on chest output over the next 24 hrs, continue radiographic surveillance. Possible removal of chest tube before his discharge home.       2. Urinary retention. Chronic foley catheter, urine infection has been ruled out.    3. Non ischemic cardiomyopathy, worsening LV systolic function.  Echocardiogram now worsening  LV systolic function, EF is down to 35 to 40% with entire lateral wall and mid distal inferior wall are hypokinetic.  Urine output 1,910 ml over last 24 hrs. Blood pressure this am low down to 99/67 mmHg  Plan to transition to oral furosemide 40 mg daily in am to keep negative fluid balance.  Continue close blood pressure monitoring, hypotension limiting  implementation of guideline directed therapy.  Continue with carvedilol.   4. T2DM/ dyslipidemia  Continue with insulin siding scale for glucose cover and monitoring.  Fasting glucose this am was 156 mg/dl.   On statin therapy.    5. Hyponatremia and  hypomagnesemia.  Stable renal function and electrolytes, Na is 134, K is 4,7 and serum bicarbonate at 32. Had appropriate response to diuretic therapy. Follow renal function in am.   6. Paraplegia (spinal stenosis sp cervical spine fusion) and neuropathy. Obesity class 1 Continue with baclofen and gabapentin     7. Anemia of chronic disease.  Stable cel count,.  with Htb at 10,0 and hct 32,4.      Patient continue to be at high risk for worsening left pleural effusion   Status is: Inpatient  Remains inpatient appropriate because: chest tube management     DVT prophylaxis:  Enoxaparin   Code Status:    DNR   Family Communication:  I spoke with patient's wife at the bedside, we talked in detail about patient's condition, plan of care and prognosis and all questions were addressed.    Consultants:  Pulmonary   Procedures:  Pleural fibrinolysis   Antimicrobials:  Augmentin     Subjective: Patient with no nausea or vomiting, he reports his dyspnea to be better. No chest pain,   Objective: Vitals:   03/16/21 2341 03/17/21 0356 03/17/21 0807 03/17/21 1053  BP: (!) 121/59 110/69 110/73 99/67  Pulse: 77 65 80 (!) 58  Resp: 20 20 20 19   Temp: 97.8 F (36.6 C) (!) 97.4 F (36.3 C) 97.6 F (36.4 C) 98.4 F (36.9 C)  TempSrc: Oral Axillary Oral Oral  SpO2: 95% 95% 98% 96%    Intake/Output Summary (Last 24 hours) at 03/17/2021 1232 Last data filed at 03/17/2021 1055 Gross per 24 hour  Intake 1450 ml  Output 2110 ml  Net -660 ml   There were no vitals filed for this visit.  Examination:   General: Not in pain or dyspnea, deconditioned  Neurology: Awake and alert, non focal  E ENT: positive pallor, no icterus,  oral mucosa moist Cardiovascular: No JVD. S1-S2 present, rhythmic, no gallops, rubs, or murmurs. No lower extremity edema. Pulmonary: decreased breath sounds on the left side, with no wheezing or rhonchi Gastrointestinal. Abdomen soft and non tender Skin. No rashes Musculoskeletal: no joint deformities     Data Reviewed: I have personally reviewed following labs and imaging studies  CBC: Recent Labs  Lab 03/13/21 0256 03/14/21 0606 03/15/21 0123 03/16/21 0118 03/17/21 0338  WBC 14.8* 18.8* 15.7* 15.2* 14.3*  NEUTROABS  --  15.1*  --   --   --   HGB 10.3* 10.8* 9.9* 10.1* 10.0*  HCT 31.4* 33.2* 31.7* 31.8* 32.4*  MCV 94.0 95.1 96.1 95.2 97.6  PLT 426* 421* 392 406* 380   Basic Metabolic Panel: Recent Labs  Lab 03/11/21 0629 03/12/21 0318 03/13/21 0256 03/14/21 0606 03/15/21 0123 03/16/21 0118 03/17/21 0338  NA 130* 133* 134* 138 137 134* 134*  K 4.0 3.9 4.1 4.5 4.4 4.8 4.7  CL 96* 98 100 102  100 98 96*  CO2 29 30 29 31  33* 31 32  GLUCOSE 166* 194* 194* 253* 187* 168* 156*  BUN 43* 38* 27* 18 15 13 15   CREATININE 0.67 0.57* 0.49* 0.69 0.56* 0.60* 0.66  CALCIUM 9.1 9.0 9.2 9.2 9.0 8.8* 8.8*  MG 1.7 1.8 1.5* 1.5* 1.7  --   --    GFR: Estimated Creatinine Clearance: 99.4 mL/min (by C-G formula based on SCr of 0.66 mg/dL). Liver Function Tests: Recent Labs  Lab 03/14/21 0606  AST 12*  ALT 9  ALKPHOS 72  BILITOT 0.4  PROT 5.9*  ALBUMIN 1.9*   No results for input(s): LIPASE, AMYLASE in the last 168 hours. No results for input(s): AMMONIA in the last 168 hours. Coagulation Profile: Recent Labs  Lab 03/14/21 0606  INR 1.1   Cardiac Enzymes: No results for input(s): CKTOTAL, CKMB, CKMBINDEX, TROPONINI in the last 168 hours. BNP (last 3 results) No results for input(s): PROBNP in the last 8760 hours. HbA1C: No results for input(s): HGBA1C in the last 72 hours. CBG: Recent Labs  Lab 03/16/21 1156 03/16/21 1614 03/16/21 2108 03/17/21 0619  03/17/21 1058  GLUCAP 157* 159* 177* 154* 171*   Lipid Profile: No results for input(s): CHOL, HDL, LDLCALC, TRIG, CHOLHDL, LDLDIRECT in the last 72 hours. Thyroid Function Tests: No results for input(s): TSH, T4TOTAL, FREET4, T3FREE, THYROIDAB in the last 72 hours. Anemia Panel: No results for input(s): VITAMINB12, FOLATE, FERRITIN, TIBC, IRON, RETICCTPCT in the last 72 hours.    Radiology Studies: I have reviewed all of the imaging during this hospital visit personally     Scheduled Meds:  alteplase (TPA) for intrapleural administration  10 mg Intrapleural Once   And   pulmozyme (DORNASE) for intrapleural administration  5 mg Intrapleural Once   amoxicillin-clavulanate  1 tablet Oral Q12H   aspirin  81 mg Oral Daily   baclofen  5 mg Oral TID   carvedilol  6.25 mg Oral BID WC   Chlorhexidine Gluconate Cloth  6 each Topical Daily   enoxaparin (LOVENOX) injection  40 mg Subcutaneous Q24H   famotidine  20 mg Oral QPM   furosemide  40 mg Intravenous BID   gabapentin  300 mg Oral QHS   insulin aspart  0-9 Units Subcutaneous TID WC   simvastatin  20 mg Oral q1800   sodium chloride flush  10 mL Other Q8H   Continuous Infusions:   LOS: 3 days        Mattie Novosel 14/06/22, MD

## 2021-03-17 NOTE — Consult Note (Signed)
NAME:  Olivier Frayre., MRN:  [id number], DOB:  01-11-1946, LOS: 3 ADMISSION DATE:  03/14/2021, CONSULTATION DATE:  03/15/2021 REFERRING MD:  Dr. Ella Jubilee, CHIEF COMPLAINT:  Loculated pleural effusion    History of Present Illness:  Draycen Leichter. Is a 75 y.o. male with a PMH significant for paraplegia-bedbound, type 2 diabetes, CAD, non-ischemic cardiomyopathy, GERD, HLD, and HTN, who presented to [facility] 11/26 due to acute respiratory failure and was found to have a left loculated pleural effusion on imagining. IR was then consulted and left pigtail chest tube was placed 11/28. Per chart he also received 5 days of IV Ceftriaxone and Azithromycin.   Decision was made to transfer patient to [facility] for further management and possible need for pleural lytics. [facility] was initially consulted but [facility] was ultimately consulted for chest tube management.   Pertinent  Medical History  Paraplegia-bedbound, type 2 diabetes, CAD, non-ischemic cardiomyopathy, GERD, HLD, and HTN,  Significant Hospital Events: Including procedures, antibiotic start and stop dates in addition to other pertinent events   11/26 admitted to [facility] for acute respiratory failure seen with likely loculated pleural effusion  11/28 pigtail chest tube placed  12/3 transferred to [facility]  12/4 Spouse states that chest drainage system has been exchanged once therefore total out is now near  Interim History / Subjective:   Feeling okay.  Denies any dyspnea, chest pain  Objective   Blood pressure 99/67, pulse (!) 58, temperature 98.4 F (36.9 C), temperature source Oral, resp. rate 19, SpO2 96 %.        Intake/Output Summary (Last 24 hours) at 03/17/2021 1115 Last data filed at 03/17/2021 1055 Gross per 24 hour  Intake 1450 ml  Output 2110 ml  Net -660 ml   There were no vitals filed for this visit.  Examination: General; Pleasant gentleman, laying in bed, no distress HEENT:  Oropharynx clear Neuro: Awake, alert, globally weak, not moving lower extremities CV: Irregular, distant, no murmur PULM: Coarse on the left.  Distant but clear on the right.  Chest tube in place without any air leak GI: Distended, positive bowel sounds Extremities: No edema Skin: No rash   Resolved Hospital Problem list     Assessment & Plan:  Acute hypoxic respiratory failure  -Secondary to CAP and loculated pleural effusion  Left pleural effusion  -Fluid analysis with 1,857 wbc, (95% neutrophils), glucose less than 20, protein 34, no pH or LDH tested. Protein 4,2. Clear liquid. P: -Received lytics 12/4 with good output.  We will try again on 12/6 to see if any further output obtained.  If not then likely can consider discontinuation chest tube soon to facilitate him going home. -Agree with prolonged course of Augmentin, plan for 3 weeks -Standard chest tube care, flushes  Discussed the plans with the patient and his wife at bedside.   Best Practice (right click and "Reselect all SmartList Selections" daily)   Per primary   Labs   CBC: Recent Labs  Lab 03/13/21 0256 03/14/21 0606 03/15/21 0123 03/16/21 0118 03/17/21 0338  WBC 14.8* 18.8* 15.7* 15.2* 14.3*  NEUTROABS  --  15.1*  --   --   --   HGB 10.3* 10.8* 9.9* 10.1* 10.0*  HCT 31.4* 33.2* 31.7* 31.8* 32.4*  MCV 94.0 95.1 96.1 95.2 97.6  PLT 426* 421* 392 406* 380    Basic Metabolic Panel: Recent Labs  Lab 03/11/21 0629 03/12/21 0318 03/13/21 0256 03/14/21 0606  03/15/21 0123 03/16/21 0118 03/17/21 0338  NA 130* 133* 134* 138 137 134* 134*  K 4.0 3.9 4.1 4.5 4.4 4.8 4.7  CL 96* 98 100 102 100 98 96*  CO2 29 30 29 31  33* 31 32  GLUCOSE 166* 194* 194* 253* 187* 168* 156*  BUN 43* 38* 27* 18 15 13 15   CREATININE 0.67 0.57* 0.49* 0.69 0.56* 0.60* 0.66  CALCIUM 9.1 9.0 9.2 9.2 9.0 8.8* 8.8*  MG 1.7 1.8 1.5* 1.5* 1.7  --   --    GFR: Estimated Creatinine Clearance: 99.4 mL/min (by C-G formula based on  SCr of 0.66 mg/dL). Recent Labs  Lab 03/14/21 0606 03/15/21 0123 03/16/21 0118 03/17/21 0338  WBC 18.8* 15.7* 15.2* 14.3*    Liver Function Tests: Recent Labs  Lab 03/14/21 0606  AST 12*  ALT 9  ALKPHOS 72  BILITOT 0.4  PROT 5.9*  ALBUMIN 1.9*   No results for input(s): LIPASE, AMYLASE in the last 168 hours. No results for input(s): AMMONIA in the last 168 hours.  ABG    Component Value Date/Time   PHART 7.40 01/09/2021 0311   PCO2ART 42 01/09/2021 0311   PO2ART 85 01/09/2021 0311   HCO3 26.0 01/09/2021 0311   O2SAT 96.4 01/09/2021 0311     Coagulation Profile: Recent Labs  Lab 03/14/21 0606  INR 1.1    Cardiac Enzymes: No results for input(s): CKTOTAL, CKMB, CKMBINDEX, TROPONINI in the last 168 hours.  HbA1C: Hgb A1c MFr Bld  Date/Time Value Ref Range Status  03/11/2021 06:29 AM 6.0 (H) 4.8 - 5.6 % Final    Comment:    (NOTE)         Prediabetes: 5.7 - 6.4         Diabetes: >6.4         Glycemic control for adults with diabetes: <7.0   08/25/2020 01:22 AM 6.1 (H) 4.8 - 5.6 % Final    Comment:    (NOTE) Pre diabetes:          5.7%-6.4%  Diabetes:              >6.4%  Glycemic control for   <7.0% adults with diabetes     CBG: Recent Labs  Lab 03/16/21 1156 03/16/21 1614 03/16/21 2108 03/17/21 0619 03/17/21 1058  GLUCAP 157* 159* 177* 154* 171*    Review of Systems:   Please see the history of present illness. All other systems reviewed and are negative   Past Medical History:  He,  has a past medical history of Bedbound, Bilateral shoulder pain, CHF (congestive heart failure) (HCC), Coronary artery disease, Diabetes mellitus without complication (HCC), GERD (gastroesophageal reflux disease), Glaucoma, HLD (hyperlipidemia), Hypertension, Lumbar stenosis, Non-ischemic cardiomyopathy (HCC), PVC (premature ventricular contraction), and Sleep apnea.   Surgical History:   Past Surgical History:  Procedure Laterality Date   ANTERIOR  CERVICAL DECOMP/DISCECTOMY FUSION N/A 01/29/2020   Procedure: ANTERIOR CERVICAL DECOMPRESSION/DISCECTOMY FUSION 1 LEVEL C3/4;  Surgeon: 14/06/22, MD;  Location: [facility] ORS;  Service: Neurosurgery;  Laterality: N/A;   arm surgery Right    4x surgery as a child, cut arm falling through glass window    HYDROCELE EXCISION / REPAIR     POSTERIOR CERVICAL FUSION/FORAMINOTOMY N/A 05/06/2020   Procedure: C3-4 LAMINECTOMY & C3-4 FUSION;  Surgeon: [name], MD;  Location: [facility] ORS;  Service: Neurosurgery;  Laterality: N/A;   REPLACEMENT TOTAL KNEE Right      Social History:   reports that he has  never smoked. He has never used smokeless tobacco. He reports that he does not drink alcohol and does not use drugs.   Family History:  His family history is not on file.   Allergies Allergies  Allergen Reactions   Ace Inhibitors Other (See Comments)    Angioedema   Lisinopril Swelling    Angioedema     Home Medications  Prior to Admission medications   Medication Sig Start Date End Date Taking? Authorizing Provider  acetaminophen (TYLENOL) 500 MG tablet Take 2 tablets (1,000 mg total) by mouth every 6 (six) hours as needed. 03/11/21   Darlin Priestly, MD  amLODipine (NORVASC) 10 MG tablet Hold due to soft blood pressure. 03/11/21   Darlin Priestly, MD  aspirin 81 MG chewable tablet Chew 81 mg by mouth daily.    [provider]  baclofen (LIORESAL) 10 MG tablet Take 5 mg by mouth 3 (three) times daily.    [provider]  brimonidine (ALPHAGAN) 0.2 % ophthalmic solution Place 1 drop into the right eye in the morning and at bedtime. 11/21/20   [provider]  calcium carbonate (TUMS - DOSED IN MG ELEMENTAL CALCIUM) 500 MG chewable tablet Chew 1 tablet by mouth 2 (two) times daily as needed for indigestion or heartburn.    [provider]  carvedilol (COREG) 6.25 MG tablet Take 6.25 mg by mouth 2 (two) times daily. 12/31/20   [provider]  cyanocobalamin 2000 MCG  tablet Take 2,000 mcg by mouth daily. 01/24/19   [provider]  famotidine (PEPCID) 20 MG tablet Take 20 mg by mouth every evening.    [provider]  gabapentin (NEURONTIN) 300 MG capsule Take 300 mg by mouth at bedtime.    [provider]  insulin aspart (NOVOLOG) 100 UNIT/ML injection Hold since blood glucose within goal without any insulin. 03/11/21   Darlin Priestly, MD  insulin detemir (LEVEMIR FLEXTOUCH) 100 UNIT/ML FlexPen Hold since blood glucose within goal without any insulin. 03/11/21   Darlin Priestly, MD  latanoprost (XALATAN) 0.005 % ophthalmic solution Place 1 drop into the right eye at bedtime. 08/20/19   [provider]  meloxicam (MOBIC) 7.5 MG tablet Take 1 tablet (7.5 mg total) by mouth daily as needed for pain. 03/11/21   Darlin Priestly, MD  metFORMIN (GLUCOPHAGE-XR) 500 MG 24 hr tablet Take 500 mg by mouth 2 (two) times daily. 01/19/21   [provider]  Multiple Vitamins-Minerals (CENTRUM SILVER 50+MEN PO) Take 1 tablet by mouth daily.    [provider]  polyethylene glycol powder (GLYCOLAX/MIRALAX) 17 GM/SCOOP powder Take 17 g by mouth daily as needed for moderate constipation. 03/11/21   Darlin Priestly, MD  simvastatin (ZOCOR) 20 MG tablet Take 20 mg by mouth daily at 6 PM.    [provider]  tamsulosin (FLOMAX) 0.4 MG CAPS capsule Take 1 capsule (0.4 mg total) by mouth daily after supper. 01/25/20   Wouk, Wilfred Curtis, MD     Critical care time: N/A    Levy Pupa, MD, PhD 03/17/2021, 11:18 AM [facility] [phone] or if no answer before 7:00PM call [phone] For any issues after 7:00PM please call eLink [phone]

## 2021-03-17 NOTE — Procedures (Signed)
Pleural Fibrinolytic Administration Procedure Note  Roy [name]  [id number]  [birth year]  Date:03/17/21  Time:11:18 AM   Provider Performing:Darnell Jeschke S Cletus Paris   Procedure: Pleural Fibrinolysis Subsequent day (19509)  Indication(s) Fibrinolysis of complicated pleural effusion  Consent Risks of the procedure as well as the alternatives and risks of each were explained to the patient and/or caregiver.  Consent for the procedure was obtained.   Anesthesia None   Time Out Verified patient identification, verified procedure, site/side was marked, verified correct patient position, special equipment/implants available, medications/allergies/relevant history reviewed, required imaging and test results available.   Sterile Technique Hand hygiene, gloves   Procedure Description Existing pleural catheter was cleaned and accessed in sterile manner.  10mg  of tPA in 30cc of saline and 5mg  of dornase in 30cc of sterile water were injected into pleural space using existing pleural catheter.  Catheter will be clamped for 1 hour and then placed back to suction.   Complications/Tolerance None; patient tolerated the procedure well.   EBL None   Specimen(s) None  , MD, PhD 03/17/2021, 11:19 AM [facility] [phone] or if no answer before 7:00PM call [phone] For any issues after 7:00PM please call eLink [phone]

## 2021-03-18 ENCOUNTER — Inpatient Hospital Stay (HOSPITAL_COMMUNITY): Payer: Medicare HMO

## 2021-03-18 DIAGNOSIS — J869 Pyothorax without fistula: Secondary | ICD-10-CM | POA: Diagnosis not present

## 2021-03-18 LAB — CBC
HCT: 32.9 % — ABNORMAL LOW (ref 39.0–52.0)
Hemoglobin: 10.3 g/dL — ABNORMAL LOW (ref 13.0–17.0)
MCH: 30.3 pg (ref 26.0–34.0)
MCHC: 31.3 g/dL (ref 30.0–36.0)
MCV: 96.8 fL (ref 80.0–100.0)
Platelets: 366 10*3/uL (ref 150–400)
RBC: 3.4 MIL/uL — ABNORMAL LOW (ref 4.22–5.81)
RDW: 13.2 % (ref 11.5–15.5)
WBC: 16.8 10*3/uL — ABNORMAL HIGH (ref 4.0–10.5)
nRBC: 0 % (ref 0.0–0.2)

## 2021-03-18 LAB — BASIC METABOLIC PANEL
Anion gap: 8 (ref 5–15)
BUN: 18 mg/dL (ref 8–23)
CO2: 31 mmol/L (ref 22–32)
Calcium: 8.6 mg/dL — ABNORMAL LOW (ref 8.9–10.3)
Chloride: 95 mmol/L — ABNORMAL LOW (ref 98–111)
Creatinine, Ser: 0.69 mg/dL (ref 0.61–1.24)
GFR, Estimated: >60 mL/min (ref >60)
Glucose, Bld: 154 mg/dL — ABNORMAL HIGH (ref 70–99)
Potassium: 4.8 mmol/L (ref 3.5–5.1)
Sodium: 134 mmol/L — ABNORMAL LOW (ref 135–145)

## 2021-03-18 LAB — GLUCOSE, CAPILLARY
Glucose-Capillary: 173 mg/dL — ABNORMAL HIGH (ref 70–99)
Glucose-Capillary: 168 mg/dL — ABNORMAL HIGH (ref 70–99)

## 2021-03-18 IMAGING — DX DG CHEST 1V PORT
1 series · 1 of 1 positions shown · non-contrast
Comparison: Chest radiograph [DATE] at [DATE] a.m.

CLINICAL DATA: Pleural effusion.  Shortness of breath.

EXAM:
PORTABLE CHEST 1 VIEW

[chest ap]
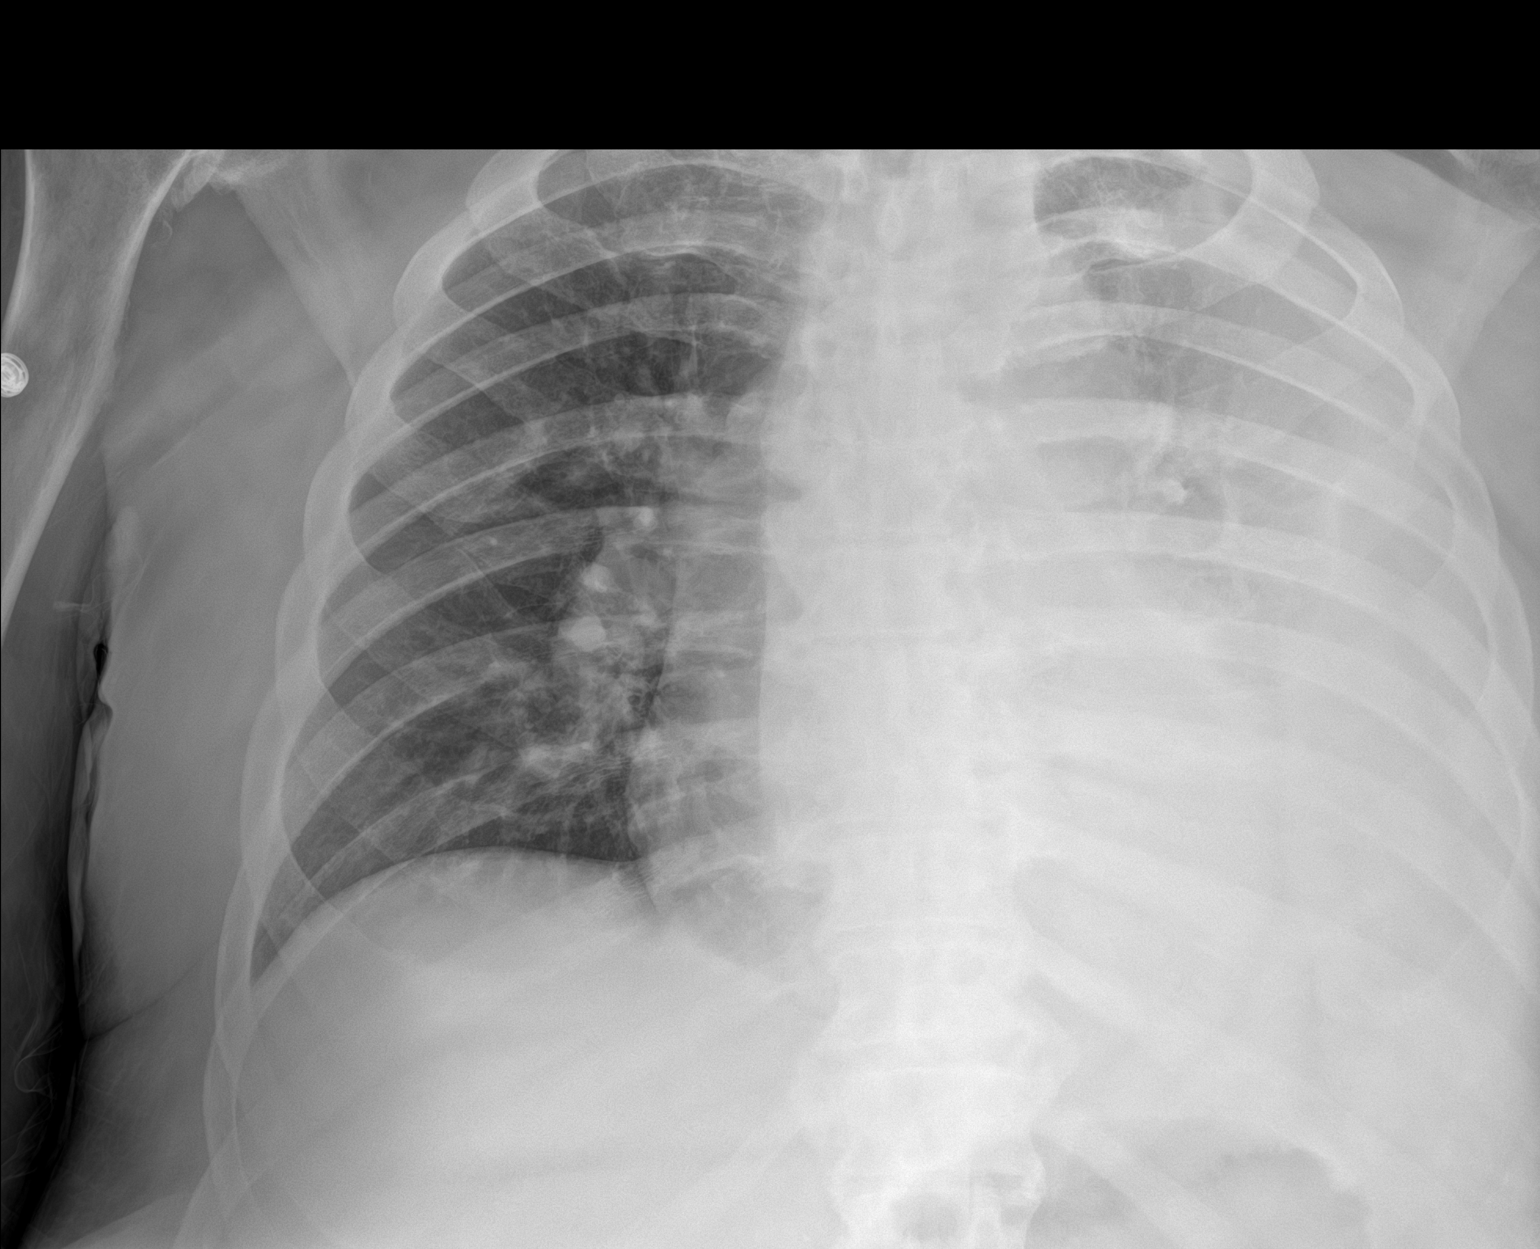

[1 of 1 positions shown; findings below may reference images not displayed]

FINDINGS: The cardiac silhouette is partially obscured but again appears
enlarged. Extensive left hemithorax opacification reflecting a large
pleural effusion and airspace consolidation has not significantly
changed with similar appearance of residual left upper lobe
aeration. The right lung remains clear. No pneumothorax is
identified.
IMPRESSION: Unchanged large left pleural effusion and left lung consolidation.

## 2021-03-18 IMAGING — DX DG CHEST 1V PORT
1 series · 1 of 1 positions shown · non-contrast
Comparison: Yesterday

CLINICAL DATA: Post chest tube removal with known pleural effusion.

EXAM:
PORTABLE CHEST 1 VIEW

[chest ap]
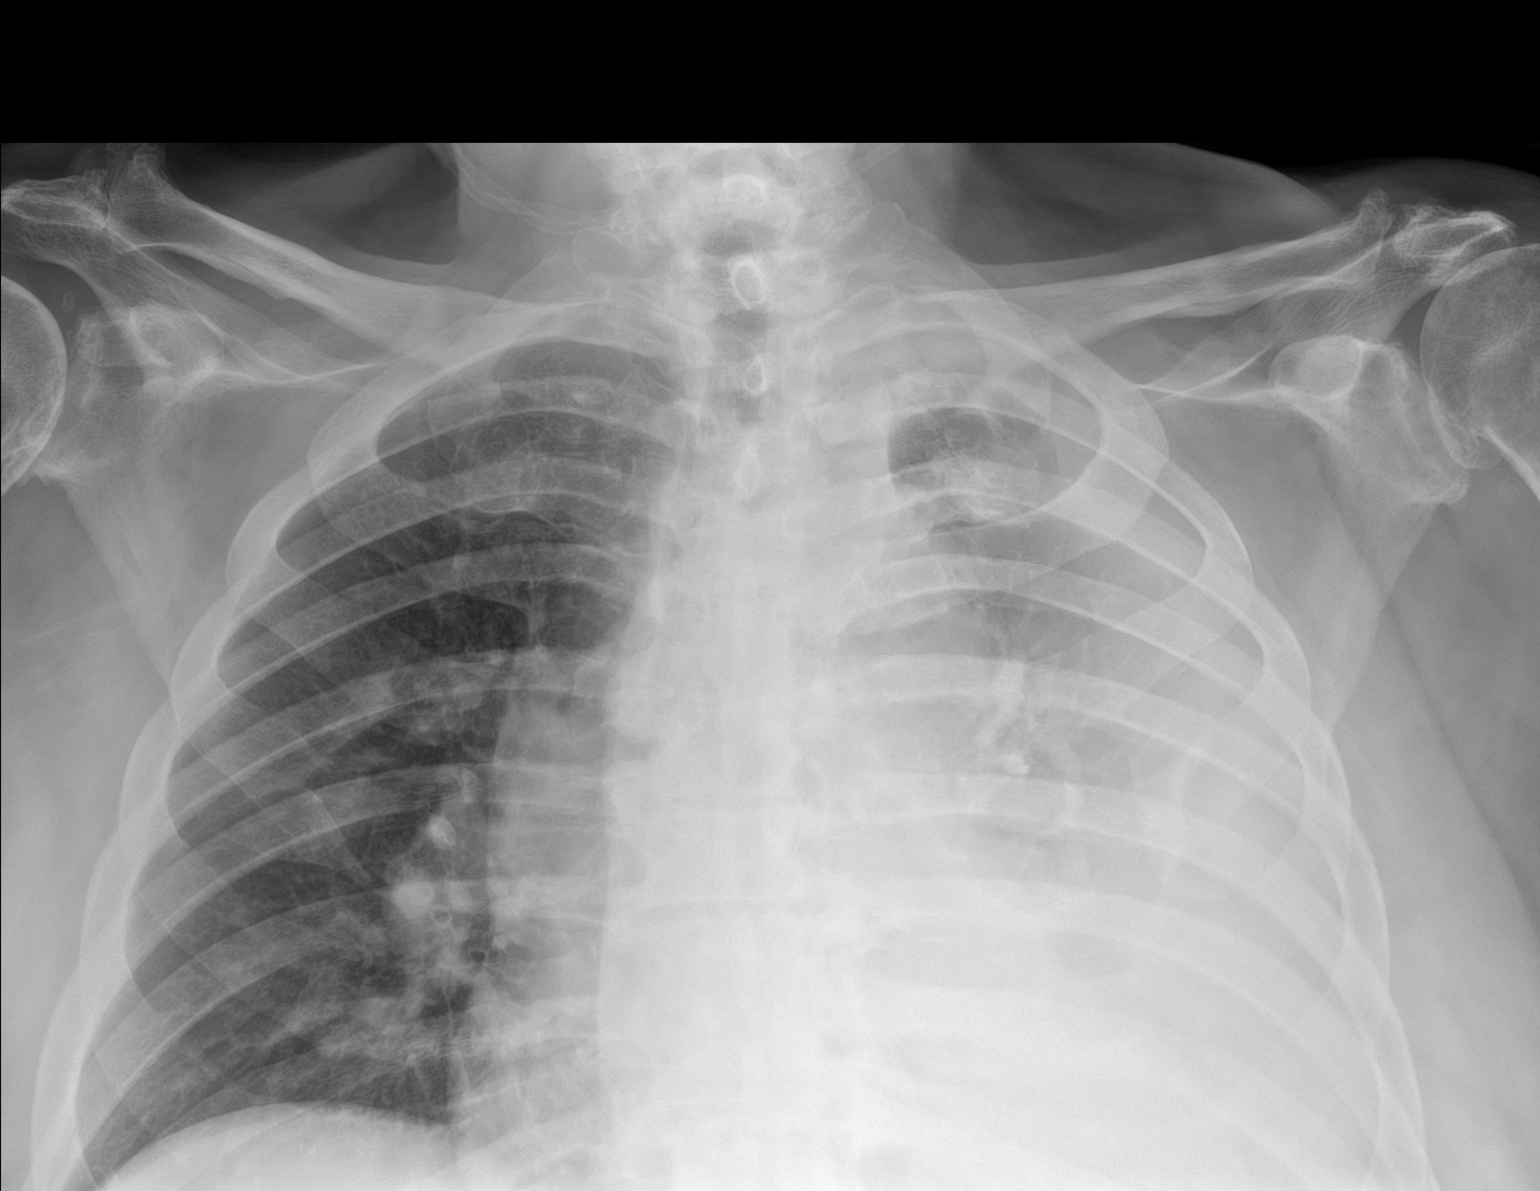

[1 of 1 positions shown; findings below may reference images not displayed]

FINDINGS: Midline trachea. The inferior most chest is excluded. Moderate
cardiomegaly. No pneumothorax. Slightly worsened left-sided
aeration, with increased pleural fluid and airspace disease. Clear
right lung. Cervical spine fixation.
IMPRESSION: Progressive left-sided pleural fluid and airspace disease.

No pneumothorax or other acute complication.

## 2021-03-18 MED ORDER — STERILE WATER FOR INJECTION IJ SOLN: 5.0000 mg | Freq: Once | RESPIRATORY_TRACT | Status: DC

## 2021-03-18 MED ORDER — AMOXICILLIN-POT CLAVULANATE 875-125 MG PO TABS: 1.0000 | ORAL_TABLET | Freq: Two times a day (BID) | ORAL | 0 refills | Status: AC

## 2021-03-18 MED ORDER — SODIUM CHLORIDE (PF) 0.9 % IJ SOLN: 10.0000 mg | Freq: Once | INTRAMUSCULAR | Status: DC

## 2021-03-18 MED ORDER — OXYCODONE HCL 5 MG PO TABS: 5.0000 mg | ORAL_TABLET | ORAL | 0 refills | Status: AC | PRN

## 2021-03-18 MED ORDER — FUROSEMIDE 40 MG PO TABS: 40.0000 mg | ORAL_TABLET | Freq: Every day | ORAL | 0 refills | Status: AC

## 2021-03-18 NOTE — Care Management Important Message (Signed)
Important Message  Patient Details  Name: Roy [name]. MRN: [id number] Date of Birth: [birth year]   Medicare Important Message Given:  Yes     [name] 03/18/2021, 10:12 AM

## 2021-03-18 NOTE — Progress Notes (Addendum)
   NAME:  Jerimey Burridge., MRN:  [id number], DOB:  [birth year], LOS: 4 ADMISSION DATE:  03/14/2021, CONSULTATION DATE:  03/15/2021 REFERRING MD:  Dr. Ella Jubilee, CHIEF COMPLAINT:  Loculated pleural effusion    History of Present Illness:  [place]. Is a 75 y.o. male with a PMH significant for paraplegia-bedbound, type 2 diabetes, CAD, non-ischemic cardiomyopathy, GERD, HLD, and HTN, who presented to [facility] 11/26 due to acute respiratory failure and was found to have a left loculated pleural effusion on imagining. IR was then consulted and left pigtail chest tube was placed 11/28. Per chart he also received 5 days of IV Ceftriaxone and Azithromycin.   Decision was made to transfer patient to [facility] for further management and possible need for pleural lytics. [facility] was initially consulted but on [facility] was ultimately consulted for chest tube management.   Pertinent  Medical History  Paraplegia-bedbound, type 2 diabetes, CAD, non-ischemic cardiomyopathy, GERD, HLD, and HTN,  Significant Hospital Events: Including procedures, antibiotic start and stop dates in addition to other pertinent events   11/26 admitted to [facility] for acute respiratory failure seen with likely loculated pleural effusion  11/28 pigtail chest tube placed  12/3 transferred to [facility]  12/4 Spouse states that chest drainage system has been exchanged once therefore total out is now near 3300 mls  Interim History / Subjective:  Afebrile  CT drainage essentially zero in last 24 hours    Objective   Blood pressure 119/71, pulse 72, temperature 98.3 F (36.8 C), temperature source Oral, resp. rate 17, SpO2 90 %.        Intake/Output Summary (Last 24 hours) at 03/18/2021 0747 Last data filed at 03/18/2021 0314 Gross per 24 hour  Intake 480 ml  Output 1750 ml  Net -1270 ml   There were no vitals filed for this visit.  Examination: General: adult male lying in bed in NAD, wife at  bedside HEENT: MM pink/moist, good dentition, anicteric,  O2 Neuro: AAOx4, speech clear, moves upper extremities  CV: s1s2 RRR, no m/r/g PULM: non-labored at rest, clear on right, diminished on left.  CT in place on left, discontinued during exam  GI: soft, bsx4 active  Extremities: warm/dry, no edema  Skin: no rashes or lesions   Resolved Hospital Problem list     Assessment & Plan:   Acute Hypoxic Respiratory Failure secondary to CAP with associated Loculated Left Pleural Effusion (NOS) Fluid analysis with 1,857 wbc, (95% neutrophils), glucose less than 20, protein 34, no pH or LDH tested. Protein 4,2. Clear liquid. Suspect Parapneumonic process.  -reviewed with primary, wife at bedside > no drainage, will discontinue chest tube.  No further tPA/dornase  -plan for prolonged augmentin, likely 3 weeks  -discussed wound care with wife at bedside > no submersion in water until chest tube site closed.  Keep dressing in place for 24-48 hours, and then reassess. IF closed, no dressing needed, if remains open, keep covered until closed.  -follow up CXR now, if no changes ok to proceed with discharge  Best Practice (right click and "Reselect all SmartList Selections" daily)  Per primary   Critical care time: N/A    Canary Brim, MSN, APRN, NP-C, AGACNP-BC [facility] 03/18/2021, 7:47 AM   Please see [email or web address] for pager details.   From 7A-7P if no response, please call [phone] After hours, please call ELink [phone]

## 2021-03-18 NOTE — TOC Transition Note (Signed)
Transition of Care (TOC) - CM/SW Discharge Note Donn Pierini RN, BSN Transitions of Care Unit 4E- RN Case Manager See Treatment Team for direct phone #    Patient Details  Name: Roy [name]. MRN: [id number] Date of Birth: 04/12/1946  Transition of Care [facility]) CM/SW Contact:  Darrold Span, RN Phone Number: 03/18/2021, 12:32 PM   Clinical Narrative:    Pt has been cleared for transition home with hospice, chest tube removed this am.  Per wife home 02 is scheduled to be delivered today around noon. GOLD DNR on chart.  Spoke with Eben Burow with [facility] regarding planned discharge today, she will check on admission schedule for nursing visit either today or tomorrow.   1200- [facility] called for transport- and pt placed on list- at this time [facility] has informed that they have 2 scheduled again and transport estimated for around 1:30pm. - paperwork placed on chart. Bedside RN updated Call made to wife to inform her of transport arrangements, at this time she is still awaiting home 02 delivery but expects it to be there by the time patient arrives- pt uses it PRN - wife states pt will be ok without it if he arrives before he gets there. [facility] also updated regarding transport and will reach out to wife regarding admission visit scheduling.    Final next level of care: Home w Hospice Care Barriers to Discharge: Barriers Resolved   Patient Goals and CMS Choice Patient states their goals for this hospitalization and ongoing recovery are:: return home CMS Medicare.gov Compare Post Acute Care list provided to:: Patient Represenative (must comment) (wife) Choice offered to / list presented to : Spouse  Discharge Placement                 Home w/ Hospice      Discharge Plan and Services   Discharge Planning Services: CM Consult Post Acute Care Choice: Hospice          DME Arranged: Hospice Equipment Package Others DME Agency: [employer] Date DME Agency  Contacted: 03/16/21   Representative spoke with at DME Agency: home 02 being arranged per [facility]   [facility] Agency: [facility] Date [facility] Agency Contacted: 03/16/21 Time HH Agency Contacted: 1630 Representative spoke with at [facility] Agency: [place]  Social Determinants of Health (SDOH) Interventions     Readmission Risk Interventions Readmission Risk Prevention Plan 03/18/2021  Transportation Screening Complete  PCP or Specialist Appt within 5-7 Days Complete  Home Care Screening Complete  Medication Review (RN CM) Complete  Some recent data might be hidden

## 2021-03-18 NOTE — Discharge Summary (Signed)
Physician Discharge Summary  Roy [name]. TKW:[id number] DOB: [birth year] DOA: 03/14/2021  PCP: Lorelee New, MD  Admit date: 03/14/2021 Discharge date: 03/18/2021  Admitted From: Home Disposition: Home with home hospice  Recommendations for Outpatient Follow-up:  As per home hospice provider  Home Health: N/A Equipment/Devices: Available through hospice  Discharge Condition: Fair CODE STATUS: DNR/DNI Diet recommendation: Regular as tolerated  Discharge summary: 75 year old gentleman who has paraplegia and bedbound with multiple issues including cervical spinal stenosis, type 2 diabetes, coronary artery disease, nonischemic cardiomyopathy brought to [facility] emergency room on 11/26 due to shortness of breath and found to have left-sided pleural effusion and empyema.  Patient underwent thoracentesis and chest tube on the left side and received IV antibiotic therapy.  Patient continued to have retained pleural effusion which was thought to be loculated so transferred to [facility] and seen by pulmonary.  2/4-12/7 chest tube placed, purulent lysis with some improvement.  Initially drained well and since then not all the effusion was drained.  Cultures negative.  Decided against surgical intervention including further thoracentesis/thoracoscopy or VATS.  A decision was made to take the chest tube out as patient is more comfortable now and not to pursue further remaining loculated pleural effusion but treated with antibiotics for 3 weeks.  Acute hypoxemic respiratory failure secondary to community-acquired pneumonia complicated with parapneumonic effusion/empyema and loculated effusion: Negative cultures. Unable to drain completely with chest tube in fibrinolytics. Goal of care discussion, removal of chest tube today before discharge.  Augmentin for 21 days.  Chronic urinary retention: Patient has a long-term Foley catheter in place.  Nonischemic cardiomyopathy, reduced  ejection fraction to 35 to 40% Type 2 diabetes and dyslipidemia, currently not needing any therapy because of poor appetite. Electrolyte abnormalities, corrected. Paraplegia and severe neuropathy, adult failure to thrive and profound debility Anemia of chronic disease  Goal of care discussion: Met with patient and wife at the bedside.  Patient wanting to go home.  They have already discussed with hospice provider and planning to go home with home hospice. DNR/DNI.  Gold paper in the chart. I discussed with patient's wife about MOST form and filled it out with DNR/comfort care and limited intervention. Since patient is comfortable and wanting to go home, he can go home today with hospice in place. He is not using any benzodiazepines or opiates, he is only using Tylenol for pain, advised as needed oxycodone and sent to the pharmacy.    Discharge Diagnoses:  Principal Problem:   Empyema ([place]) Active Problems:   S/P cervical spinal fusion   Essential hypertension   HLD (hyperlipidemia)   Chronic diastolic CHF (congestive heart failure) (HCC)   Type 2 diabetes mellitus with hyperlipidemia (HCC)   Obesity (BMI 30.0-34.9)    Discharge Instructions  Discharge Instructions     Diet - low sodium heart healthy   Complete by: As directed    Increase activity slowly   Complete by: As directed    No wound care   Complete by: As directed       Allergies as of 03/18/2021       Reactions   Ace Inhibitors Other (See Comments)   Angioedema   Lisinopril Swelling   Angioedema        Medication List     STOP taking these medications    amLODipine 10 MG tablet Commonly known as: NORVASC   insulin aspart 100 UNIT/ML injection Commonly known as: novoLOG   Levemir FlexTouch 100 UNIT/ML FlexPen  Generic drug: insulin detemir   metFORMIN 500 MG 24 hr tablet Commonly known as: GLUCOPHAGE-XR   tamsulosin 0.4 MG Caps capsule Commonly known as: FLOMAX       TAKE these  medications    acetaminophen 500 MG tablet Commonly known as: TYLENOL Take 2 tablets (1,000 mg total) by mouth every 6 (six) hours as needed.   amoxicillin-clavulanate 875-125 MG tablet Commonly known as: AUGMENTIN Take 1 tablet by mouth every 12 (twelve) hours for 17 days.   aspirin 81 MG chewable tablet Chew 81 mg by mouth daily.   baclofen 10 MG tablet Commonly known as: LIORESAL Take 5 mg by mouth 3 (three) times daily.   brimonidine 0.2 % ophthalmic solution Commonly known as: ALPHAGAN Place 1 drop into the right eye in the morning and at bedtime.   calcium carbonate 500 MG chewable tablet Commonly known as: TUMS - dosed in mg elemental calcium Chew 1 tablet by mouth 2 (two) times daily as needed for indigestion or heartburn.   carvedilol 6.25 MG tablet Commonly known as: COREG Take 6.25 mg by mouth 2 (two) times daily.   CENTRUM SILVER 50+MEN PO Take 1 tablet by mouth daily.   cyanocobalamin 2000 MCG tablet Take 2,000 mcg by mouth daily.   famotidine 20 MG tablet Commonly known as: PEPCID Take 20 mg by mouth every evening.   furosemide 40 MG tablet Commonly known as: LASIX Take 1 tablet (40 mg total) by mouth daily. Start taking on: March 19, 2021   gabapentin 300 MG capsule Commonly known as: NEURONTIN Take 300 mg by mouth at bedtime.   latanoprost 0.005 % ophthalmic solution Commonly known as: XALATAN Place 1 drop into the right eye at bedtime.   meloxicam 7.5 MG tablet Commonly known as: MOBIC Take 1 tablet (7.5 mg total) by mouth daily as needed for pain.   oxyCODONE 5 MG immediate release tablet Commonly known as: Oxy IR/ROXICODONE Take 1 tablet (5 mg total) by mouth every 4 (four) hours as needed for moderate pain.   polyethylene glycol powder 17 GM/SCOOP powder Commonly known as: GLYCOLAX/MIRALAX Take 17 g by mouth daily as needed for moderate constipation.   simvastatin 20 MG tablet Commonly known as: ZOCOR Take 20 mg by mouth daily at  6 PM.        Allergies  Allergen Reactions   Ace Inhibitors Other (See Comments)    Angioedema   Lisinopril Swelling    Angioedema    Consultations: Pulmonary   Procedures/Studies: DG Chest 1 View  Result Date: 03/17/2021 CLINICAL DATA:  Pleural effusion EXAM: CHEST  1 VIEW COMPARISON:  Chest radiograph 1 day prior FINDINGS: A left basilar pigtail catheter is in place. The cardiomediastinal silhouette is grossly similar, though not well evaluated. A large left pleural effusion is again seen, not significantly changed since the prior study. A small amount of left upper lobe remains aerated. The right lung is clear. There is no right effusion. There is no appreciable pneumothorax. There is no acute osseous abnormality. IMPRESSION: No significant interval change in size of the large left pleural effusion with unchanged residual aeration of the left upper lung Electronically Signed   By: Valetta Mole M.D.   On: 03/17/2021 16:09   DG Chest 2 View  Result Date: 03/12/2021 CLINICAL DATA:  Empyema EXAM: CHEST - 2 VIEW COMPARISON:  Chest radiograph 03/07/2021 FINDINGS: There is a new pigtail catheter in place projecting over the left base. The heart is markedly enlarged, unchanged. The left  pleural effusion has decreased in size following chest tube placement, and there is improved aeration of the left upper lobe. The right lung is clear. There is no right effusion. There is no pneumothorax. There is no acute osseous abnormality. IMPRESSION: Decreased size of the left pleural effusion following pigtail catheter placement. Electronically Signed   By: Valetta Mole M.D.   On: 03/12/2021 10:36   DG Chest 2 View  Result Date: 03/07/2021 CLINICAL DATA:  Hypotension, shortness of breath, weakness, angioedema, bundle branch block EXAM: CHEST - 2 VIEW COMPARISON:  01/09/2021 FINDINGS: Enlargement of cardiac silhouette. Large LEFT pleural effusion with subtotal atelectasis of LEFT lung. Underlying  infiltrate and other abnormalities including tumor not excluded in this setting. Minimal atelectasis at RIGHT base. Remaining RIGHT lung clear. No pneumothorax or acute osseous findings. Osseous demineralization with degenerative changes of the glenohumeral joints and thoracic spine. IMPRESSION: Large LEFT pleural effusion with subtotal atelectasis of LEFT lung. Enlargement of cardiac silhouette with minimal RIGHT basilar atelectasis. Electronically Signed   By: Lavonia Dana M.D.   On: 03/07/2021 11:16   CT Chest W Contrast  Result Date: 03/07/2021 CLINICAL DATA:  Pleural effusion, hypotension, shortness of breath, weakness EXAM: CT CHEST WITH CONTRAST TECHNIQUE: Multidetector CT imaging of the chest was performed during intravenous contrast administration. CONTRAST:  98m OMNIPAQUE IOHEXOL 300 MG/ML  SOLN IV COMPARISON:  Chest radiograph 03/07/2021 FINDINGS: Cardiovascular: Atherosclerotic calcifications aorta, coronary arteries, and proximal great vessels. 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 Mild enlargement of cardiac chambers. Minimal pericardial effusion. Mediastinum/Nodes: Base of cervical region normal appearance. Esophagus unremarkable. 11 mm short axis AP window lymph node image 80. Multiple additional normal sized mediastinal lymph nodes. Fluid collection at the RIGHT paratracheal region 24 x 18 mm likely small nonspecific cyst, less likely fluid within a superior pericardial recess. Lungs/Pleura: Partially loculated large LEFT pleural effusion. Complete atelectasis LEFT lower lobe with subtotal atelectasis of LEFT upper lobe. Fluid-filled lower lobe airways. Small RIGHT pleural effusion and minimal basilar atelectasis. Upper Abdomen: Liver, spleen, adrenal glands, pancreas, and gallbladder unremarkable. Small cysts at upper pole pole of RIGHT kidney. No acute upper abdominal findings. Musculoskeletal: Advanced degenerative changes BILATERAL glenohumeral joints and LEFT  sternoclavicular joint. Advanced degenerative changes of the thoracic spine. IMPRESSION: Large partially loculated LEFT pleural effusion with complete atelectasis of LEFT lower lobe and subtotal atelectasis of LEFT upper lobe. Small RIGHT pleural effusion and minimal basilar atelectasis. Nonspecific mildly enlarged AP window lymph node. Extensive atherosclerotic disease changes including coronary arteries. Aortic Atherosclerosis (ICD10-I70.0). Electronically Signed   By: MLavonia DanaM.D.   On: 03/07/2021 12:34   DG CHEST PORT 1 VIEW  Result Date: 03/16/2021 CLINICAL DATA:  Pleural effusion EXAM: PORTABLE CHEST 1 VIEW COMPARISON:  03/15/2021 FINDINGS: Persistent left pleural effusion and left lung atelectasis/consolidation. Aeration is not substantially changed. Right lung remains clear. Similar enlargement of the cardiomediastinal silhouette. Prominent pulmonary arteries. IMPRESSION: Similar left pleural effusion and left lung atelectasis/consolidation. Electronically Signed   By: PMacy MisM.D.   On: 03/16/2021 08:43   DG CHEST PORT 1 VIEW  Result Date: 03/15/2021 CLINICAL DATA:  Chest tube in place EXAM: PORTABLE CHEST 1 VIEW COMPARISON:  03/12/2021 FINDINGS: Increased opacification of the left hemithorax likely secondary to larger effusion and greater atelectasis. Right lung remains clear. No pneumothorax. Cardiomegaly. Chest tube overlies the left lung base. IMPRESSION: Increased left pleural effusion and left lung atelectasis. Left basilar chest tube present. Electronically Signed   By: PAddison LankD.  On: 03/15/2021 12:26   ECHOCARDIOGRAM COMPLETE  Result Date: 03/16/2021    ECHOCARDIOGRAM REPORT   Patient Name:   Deagen Krass. Date of Exam: 03/16/2021 Medical Rec #:  [id number]             Height:       72.0 in Accession #:    [id number]            Weight:       229.0 lb Date of Birth:  [birth year]             BSA:          2.257 m Patient Age:    32 years              BP:            122/84 mmHg Patient Gender: M                     HR:           67 bpm. Exam Location:  Inpatient Procedure: 2D Echo, Cardiac Doppler and Color Doppler Indications:    chf  History:        Patient has prior history of Echocardiogram examinations, most                 recent 01/15/2020. Cardiomyopathy, Signs/Symptoms:Chest Pain and                 Shortness of Breath; Risk Factors:Hypertension, Diabetes and                 Dyslipidemia.  Sonographer:    Melissa Morford RDCS (AE, PE) Referring Phys: [id number] [place]  1. Left ventricular ejection fraction, by estimation, is 35 to 40%. The left ventricle has moderately decreased function. The left ventricle demonstrates regional wall motion abnormalities (see scoring diagram/findings for description). The left ventricular internal cavity size was mildly dilated. There is mild left ventricular hypertrophy. Left ventricular diastolic parameters are consistent with Grade I diastolic dysfunction (impaired relaxation).  2. Right ventricular systolic function is normal. The right ventricular size is mildly enlarged. Tricuspid regurgitation signal is inadequate for assessing PA pressure.  3. The mitral valve is normal in structure. Trivial mitral valve regurgitation. No evidence of mitral stenosis.  4. The aortic valve was not well visualized. Aortic valve regurgitation is not visualized. No aortic stenosis is present.  5. The inferior vena cava is dilated in size with >50% respiratory variability, suggesting right atrial pressure of 8 mmHg. FINDINGS  Left Ventricle: Left ventricular ejection fraction, by estimation, is 35 to 40%. The left ventricle has moderately decreased function. The left ventricle demonstrates regional wall motion abnormalities. Definity contrast agent was given IV to delineate the left ventricular endocardial borders. The left ventricular internal cavity size was mildly dilated. There is mild left ventricular hypertrophy. Left  ventricular diastolic parameters are consistent with Grade I diastolic dysfunction (impaired relaxation).  LV Wall Scoring: The entire lateral wall and mid and distal inferior wall are hypokinetic. The entire anterior wall, entire septum, basal inferior segment, and apex are normal. Right Ventricle: The right ventricular size is mildly enlarged. No increase in right ventricular wall thickness. Right ventricular systolic function is normal. Tricuspid regurgitation signal is inadequate for assessing PA pressure. Left Atrium: Left atrial size was not well visualized. Right Atrium: Right atrial size was not well visualized. Pericardium: There is no evidence of pericardial effusion. Mitral Valve: The mitral valve is  normal in structure. Trivial mitral valve regurgitation. No evidence of mitral valve stenosis. Tricuspid Valve: The tricuspid valve is normal in structure. Tricuspid valve regurgitation is trivial. Aortic Valve: The aortic valve was not well visualized. Aortic valve regurgitation is not visualized. No aortic stenosis is present. Pulmonic Valve: The pulmonic valve was not well visualized. Pulmonic valve regurgitation is not visualized. Aorta: The aortic root is normal in size and structure. Venous: The inferior vena cava is dilated in size with greater than 50% respiratory variability, suggesting right atrial pressure of 8 mmHg. IAS/Shunts: The interatrial septum was not well visualized.  LEFT VENTRICLE PLAX 2D LVIDd:         5.80 cm   Diastology LVIDs:         4.80 cm   LV e' medial:    6.20 cm/s LV PW:         1.10 cm   LV E/e' medial:  10.1 LV IVS:        0.90 cm   LV e' lateral:   6.20 cm/s LVOT diam:     2.30 cm   LV E/e' lateral: 10.1 LV SV:         53 LV SV Index:   23 LVOT Area:     4.15 cm  RIGHT VENTRICLE TAPSE (M-mode): 2.1 cm LEFT ATRIUM         Index LA diam:    3.30 cm 1.46 cm/m  AORTIC VALVE LVOT Vmax:   82.90 cm/s LVOT Vmean:  59.600 cm/s LVOT VTI:    0.127 m  AORTA Ao Root diam: 3.90 cm  MITRAL VALVE MV Area (PHT): 4.49 cm    SHUNTS MV Decel Time: 169 msec    Systemic VTI:  0.13 m MV E velocity: 62.60 cm/s  Systemic Diam: 2.30 cm MV A velocity: 99.00 cm/s MV E/A ratio:  0.63 Oswaldo Milian MD Electronically signed by Oswaldo Milian MD Signature Date/Time: 03/16/2021/11:51:59 AM    Final    CT PERC PLEURAL DRAIN W/INDWELL CATH W/IMG GUIDE  Result Date: 03/10/2021 INDICATION: Large left pleural effusion EXAM: CT-guided placement of left-sided chest tube MEDICATIONS: None ANESTHESIA/SEDATION: Anxiolysis and local analgesia only COMPLICATIONS: None immediate. PROCEDURE: Informed written consent was obtained from the patient after a thorough discussion of the procedural risks, benefits and alternatives. All questions were addressed. Maximal Sterile Barrier Technique was utilized including caps, mask, sterile gowns, sterile gloves, sterile drape, hand hygiene and skin antiseptic. A timeout was performed prior to the initiation of the procedure. The patient was placed supine on the exam table. Limited CT of the chest was performed for planning purposes. This again demonstrated a large left pleural effusion. Skin entry site was marked, and the overlying skin was prepped and draped in a standard sterile fashion. Local analgesia was obtained with 1% lidocaine. Under intermittent CT fluoroscopy, a 10 French locking multipurpose drainage catheter was advanced into the left pleural fluid using trocar technique. Location was confirmed with CT imaging and return of clear, serous fluid. A sample was collected and sent to the lab for analysis. The catheter was secured to the skin using silk suture and a dressing. It was attached to pleura vac drainage system connected to low continuous suction. The patient tolerated the procedure well without immediate complication. IMPRESSION: 1. Successful CT-guided placement of a 10 French locking multipurpose drainage catheter in the left pleural space. 2. Left  pleural drainage catheter attached to Pleur-evac drainage system to low continuous suction. Further recommendations per pulmonology/thoracic surgery. Please  call IR with any further questions. Electronically Signed   By: Albin Felling M.D.   On: 03/10/2021 09:20   (Echo, Carotid, EGD, Colonoscopy, ERCP)    Subjective: Patient seen and examined.  He himself denies any complaints.  Wife at the bedside.  Detailed discussion above.  Seen with pulmonary service provider at the bedside.  Patient and wife recommended to remove chest tube at it was not draining anything since yesterday.  He is eager to go home.   Discharge Exam: Vitals:   03/18/21 0303 03/18/21 0836  BP: 119/71 (!) 108/58  Pulse: 72 75  Resp: 17 18  Temp: 98.3 F (36.8 C) 97.6 F (36.4 C)  SpO2: 90% 96%   Vitals:   03/17/21 1922 03/17/21 2339 03/18/21 0303 03/18/21 0836  BP: (!) 111/55 (!) 118/58 119/71 (!) 108/58  Pulse: 73 70 72 75  Resp: _0 Temp: 98 F (36.7 C) 98.6 F (37 C) 98.3 F (36.8 C) 97.6 F (36.4 C)  TempSrc: Oral Oral Oral Oral  SpO2: 96% 92% 90% 96%    General: Pt is alert, awake, not in acute distress Frail and debilitated.  Not in any distress.  Chronically sick looking. Cardiovascular: RRR, S1/S2 +, no rubs, no gallops Respiratory: CTA bilaterally, no wheezing, no rhonchi Abdominal: Soft, NT, ND, bowel sounds + Obese and pendulous.  He does have dependent edema on his abdominal wall. Extremities: He has trace bilateral pedal edema.  Weakness of all 4 extremities, lower extremities more than upper extremities.    The results of significant diagnostics from this hospitalization (including imaging, microbiology, ancillary and laboratory) are listed below for reference.     Microbiology: Recent Results (from the past 240 hour(s))  Body fluid culture w Gram Stain     Status: None   Collection Time: 03/09/21  5:43 PM   Specimen: Pleura; Body Fluid  Result Value Ref Range Status    Specimen Description   Final    PLEURAL Performed at [facility], [address] [place], [place] [address]    Special Requests   Final    LEFT LUNG Performed at [facility], [place], [place] [address]    Gram Stain NO WBC SEEN NO ORGANISMS SEEN   Final   Culture   Final    NO GROWTH 3 DAYS Performed at [facility], [address] [place], [place] [address]    Report Status 03/13/2021 FINAL  Final  Acid Fast Smear (AFB)     Status: None   Collection Time: 03/09/21  5:43 PM   Specimen: Chest; Lung  Result Value Ref Range Status   AFB Specimen Processing Concentration  Final   Acid Fast Smear Negative  Final    Comment: (NOTE) Performed At: [facility] [place], [place] [id number] Rush Farmer MD HQ:[id number]    Source (AFB) PLEURAL  Final    Comment: Performed at [facility], [address] [place],  [address]  Fungus Culture With Stain     Status: None (Preliminary result)   Collection Time: 03/09/21  5:43 PM   Specimen: Lung, Left  Result Value Ref Range Status   Fungus Stain Final report  Final    Comment: (NOTE) Performed At: [facility] [place], [place] [id number] Rush Farmer MD UY:[id number]    Fungus (Mycology) Culture PENDING  Incomplete   Fungal Source PLEURAL  Final    Comment: Performed at  [facility], [address] [place], [place] [address]  Fungus Culture Result     Status: None   Collection Time: 03/09/21  5:43 PM  Result Value Ref Range Status   Result 1 Comment  Final    Comment: (NOTE) KOH/Calcofluor preparation:  no fungus observed. Performed At: [facility] [place], [place] [id number] Rush Farmer MD CV:[id number]      Labs: BNP (last 3 results) Recent Labs    03/07/21 1044  BNP 579.7*   Basic Metabolic Panel: Recent Labs  Lab 03/12/21 0318 03/13/21 0256 03/14/21 0606  03/15/21 0123 03/16/21 0118 03/17/21 0338 03/18/21 0103  NA 133* 134* 138 137 134* 134* 134*  K 3.9 4.1 4.5 4.4 4.8 4.7 4.8  CL 98 100 102 100 98 96* 95*  CO2 _0 33* 31 32 31  GLUCOSE 194* 194* 253* 187* 168* 156* 154*  BUN 38* 27* _1 CREATININE 0.57* 0.49* 0.69 0.56* 0.60* 0.66 0.69  CALCIUM 9.0 9.2 9.2 9.0 8.8* 8.8* 8.6*  MG 1.8 1.5* 1.5* 1.7  --   --   --    Liver Function Tests: Recent Labs  Lab 03/14/21 0606  AST 12*  ALT 9  ALKPHOS 72  BILITOT 0.4  PROT 5.9*  ALBUMIN 1.9*   No results for input(s): LIPASE, AMYLASE in the last 168 hours. No results for input(s): AMMONIA in the last 168 hours. CBC: Recent Labs  Lab 03/14/21 0606 03/15/21 0123 03/16/21 0118 03/17/21 0338 03/18/21 0103  WBC 18.8* 15.7* 15.2* 14.3* 16.8*  NEUTROABS 15.1*  --   --   --   --   HGB 10.8* 9.9* 10.1* 10.0* 10.3*  HCT 33.2* 31.7* 31.8* 32.4* 32.9*  MCV 95.1 96.1 95.2 97.6 96.8  PLT 421* 392 406* 380 366   Cardiac Enzymes: No results for input(s): CKTOTAL, CKMB, CKMBINDEX, TROPONINI in the last 168 hours. BNP: Invalid input(s): POCBNP CBG: Recent Labs  Lab 03/17/21 0619 03/17/21 1058 03/17/21 1605 03/17/21 2150 03/18/21 0557  GLUCAP 154* 171* 176* 155* 173*   D-Dimer No results for input(s): DDIMER in the last 72 hours. Hgb A1c No results for input(s): HGBA1C in the last 72 hours. Lipid Profile No results for input(s): CHOL, HDL, LDLCALC, TRIG, CHOLHDL, LDLDIRECT in the last 72 hours. Thyroid function studies No results for input(s): TSH, T4TOTAL, T3FREE, THYROIDAB in the last 72 hours.  Invalid input(s): FREET3 Anemia work up No results for input(s): VITAMINB12, FOLATE, FERRITIN, TIBC, IRON, RETICCTPCT in the last 72 hours. Urinalysis    Component Value Date/Time   COLORURINE AMBER (A) 03/07/2021 1231   APPEARANCEUR CLEAR 03/07/2021 1231   LABSPEC >1.030 (H) 03/07/2021 1231   PHURINE 5.5 03/07/2021 1231   GLUCOSEU NEGATIVE 03/07/2021 1231    HGBUR NEGATIVE 03/07/2021 1231   BILIRUBINUR SMALL (A) 03/07/2021 [place] 03/07/2021 1231   PROTEINUR 100 (A) 03/07/2021 1231   NITRITE POSITIVE (A) 03/07/2021 1231   LEUKOCYTESUR SMALL (A) 03/07/2021 1231   Sepsis Labs Invalid input(s): PROCALCITONIN,  WBC,  LACTICIDVEN Microbiology Recent Results (from the past 240 hour(s))  Body fluid culture w Gram Stain     Status: None   Collection Time: 03/09/21  5:43 PM   Specimen: Pleura; Body Fluid  Result Value Ref Range Status   Specimen Description   Final    PLEURAL Performed at [facility], [address], [place], [place] [address]    Special Requests  Final    LEFT LUNG Performed at [facility], [place], [place] [address]    Gram Stain NO WBC SEEN NO ORGANISMS SEEN   Final   Culture   Final    NO GROWTH 3 DAYS Performed at [facility], [place] [address], [place], [place] [address]    Report Status 03/13/2021 FINAL  Final  Acid Fast Smear (AFB)     Status: None   Collection Time: 03/09/21  5:43 PM   Specimen: Chest; Lung  Result Value Ref Range Status   AFB Specimen Processing Concentration  Final   Acid Fast Smear Negative  Final    Comment: (NOTE) Performed At: [facility] [address] [place], [place] [id number] Rush Farmer MD [id number]    Source (AFB) PLEURAL  Final    Comment: Performed at [facility], [address] [place], [place] [address]  Fungus Culture With Stain     Status: None (Preliminary result)   Collection Time: 03/09/21  5:43 PM   Specimen: Lung, Left  Result Value Ref Range Status   Fungus Stain Final report  Final    Comment: (NOTE) Performed At: [facility] [place], [place] [id number] Rush Farmer MD [id number]    Fungus (Mycology) Culture PENDING  Incomplete   Fungal Source PLEURAL  Final    Comment: Performed at [facility], [address] [place],  [place] [address]  Fungus Culture Result     Status: None   Collection Time: 03/09/21  5:43 PM  Result Value Ref Range Status   Result 1 Comment  Final    Comment: (NOTE) KOH/Calcofluor preparation:  no fungus observed. Performed At: [facility] [place], [place] [id number] Rush Farmer MD [id number]      Time coordinating discharge:  45 minutes  SIGNED:   Barb Merino, MD  [facility] 03/18/2021, 9:07 AM

## 2021-04-09 LAB — FUNGUS CULTURE WITH STAIN

## 2021-04-09 LAB — FUNGUS CULTURE RESULT

## 2021-04-09 LAB — FUNGAL ORGANISM REFLEX

## 2021-04-12 DEATH — deceased

## 2021-04-20 LAB — MISC LABCORP TEST (SEND OUT): Labcorp test code: 9985

## 2021-04-23 LAB — ACID FAST CULTURE WITH REFLEXED SENSITIVITIES (MYCOBACTERIA): Acid Fast Culture: NEGATIVE

## 2021-05-05 ENCOUNTER — Telehealth: Payer: Medicare HMO | Admitting: Urology
